# Patient Record
Sex: Female | Born: 1945
Health system: Southern US, Community
[De-identification: ages and names within clinical notes are randomized; demographics above are authoritative.]

## PROBLEM LIST (undated history)

## (undated) DIAGNOSIS — K52832 Lymphocytic colitis: Secondary | ICD-10-CM

## (undated) DIAGNOSIS — S62101A Fracture of unspecified carpal bone, right wrist, initial encounter for closed fracture: Secondary | ICD-10-CM

## (undated) DIAGNOSIS — K219 Gastro-esophageal reflux disease without esophagitis: Secondary | ICD-10-CM

## (undated) DIAGNOSIS — E871 Hypo-osmolality and hyponatremia: Secondary | ICD-10-CM

## (undated) DIAGNOSIS — R112 Nausea with vomiting, unspecified: Secondary | ICD-10-CM

## (undated) DIAGNOSIS — I722 Aneurysm of renal artery: Secondary | ICD-10-CM

## (undated) DIAGNOSIS — E041 Nontoxic single thyroid nodule: Secondary | ICD-10-CM

## (undated) DIAGNOSIS — M8580 Other specified disorders of bone density and structure, unspecified site: Secondary | ICD-10-CM

## (undated) DIAGNOSIS — M810 Age-related osteoporosis without current pathological fracture: Secondary | ICD-10-CM

## (undated) DIAGNOSIS — G43909 Migraine, unspecified, not intractable, without status migrainosus: Secondary | ICD-10-CM

## (undated) DIAGNOSIS — Z8669 Personal history of other diseases of the nervous system and sense organs: Secondary | ICD-10-CM

## (undated) DIAGNOSIS — S3690XA Unspecified injury of unspecified intra-abdominal organ, initial encounter: Secondary | ICD-10-CM

## (undated) DIAGNOSIS — M5430 Sciatica, unspecified side: Secondary | ICD-10-CM

## (undated) DIAGNOSIS — I10 Essential (primary) hypertension: Secondary | ICD-10-CM

## (undated) DIAGNOSIS — I447 Left bundle-branch block, unspecified: Secondary | ICD-10-CM

## (undated) DIAGNOSIS — Z9889 Other specified postprocedural states: Secondary | ICD-10-CM

## (undated) HISTORY — DX: Lymphocytic colitis: K52.832

## (undated) HISTORY — DX: Unspecified injury of unspecified intra-abdominal organ, initial encounter: S36.90XA

## (undated) HISTORY — DX: Sciatica, unspecified side: M54.30

## (undated) HISTORY — DX: Migraine, unspecified, not intractable, without status migrainosus: G43.909

## (undated) HISTORY — DX: Age-related osteoporosis without current pathological fracture: M81.0

## (undated) HISTORY — PX: KNEE ARTHROSCOPY W/ DEBRIDEMENT: SHX1867

## (undated) HISTORY — DX: Aneurysm of renal artery: I72.2

## (undated) HISTORY — DX: Essential (primary) hypertension: I10

## (undated) HISTORY — DX: Nontoxic single thyroid nodule: E04.1

## (undated) HISTORY — PX: ABDOMINOPLASTY: SUR9

## (undated) HISTORY — DX: Other specified disorders of bone density and structure, unspecified site: M85.80

## (undated) HISTORY — PX: BREAST SURGERY: SHX581

## (undated) HISTORY — PX: JOINT REPLACEMENT: SHX530

## (undated) HISTORY — DX: Hypo-osmolality and hyponatremia: E87.1

## (undated) HISTORY — DX: Fracture of unspecified carpal bone, right wrist, initial encounter for closed fracture: S62.101A

---
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
## 1972-09-12 HISTORY — PX: VAGINAL HYSTERECTOMY: SUR661

## 1991-09-13 HISTORY — PX: OTHER SURGICAL HISTORY: SHX169

## 1998-09-12 HISTORY — PX: HEMORRHOID SURGERY: SHX153

## 1998-10-15 ENCOUNTER — Other Ambulatory Visit: Admission: RE | Admit: 1998-10-15 | Discharge: 1998-10-15 | Payer: Self-pay | Admitting: *Deleted

## 1999-09-13 HISTORY — PX: BREAST EXCISIONAL BIOPSY: SUR124

## 1999-12-14 ENCOUNTER — Encounter: Admission: RE | Admit: 1999-12-14 | Discharge: 1999-12-14 | Payer: Self-pay

## 1999-12-14 ENCOUNTER — Other Ambulatory Visit: Admission: RE | Admit: 1999-12-14 | Discharge: 1999-12-14 | Payer: Self-pay | Admitting: *Deleted

## 1999-12-16 ENCOUNTER — Encounter: Admission: RE | Admit: 1999-12-16 | Discharge: 1999-12-16 | Payer: Self-pay

## 2000-01-25 ENCOUNTER — Encounter (INDEPENDENT_AMBULATORY_CARE_PROVIDER_SITE_OTHER): Payer: Self-pay | Admitting: *Deleted

## 2000-01-25 ENCOUNTER — Ambulatory Visit (HOSPITAL_COMMUNITY): Admission: RE | Admit: 2000-01-25 | Discharge: 2000-01-25 | Payer: Self-pay | Admitting: Surgery

## 2000-01-25 ENCOUNTER — Encounter: Payer: Self-pay | Admitting: Surgery

## 2000-01-27 ENCOUNTER — Encounter: Payer: Self-pay | Admitting: Surgery

## 2001-09-12 DIAGNOSIS — E041 Nontoxic single thyroid nodule: Secondary | ICD-10-CM

## 2001-09-12 HISTORY — DX: Nontoxic single thyroid nodule: E04.1

## 2001-11-23 ENCOUNTER — Encounter: Admission: RE | Admit: 2001-11-23 | Discharge: 2001-11-23 | Payer: Self-pay | Admitting: Surgery

## 2001-11-23 ENCOUNTER — Encounter: Payer: Self-pay | Admitting: Surgery

## 2002-01-22 ENCOUNTER — Encounter: Payer: Self-pay | Admitting: Surgery

## 2002-01-22 ENCOUNTER — Encounter: Admission: RE | Admit: 2002-01-22 | Discharge: 2002-01-22 | Payer: Self-pay | Admitting: Surgery

## 2002-01-22 ENCOUNTER — Ambulatory Visit (HOSPITAL_BASED_OUTPATIENT_CLINIC_OR_DEPARTMENT_OTHER): Admission: RE | Admit: 2002-01-22 | Discharge: 2002-01-22 | Payer: Self-pay | Admitting: Surgery

## 2002-01-22 ENCOUNTER — Encounter (INDEPENDENT_AMBULATORY_CARE_PROVIDER_SITE_OTHER): Payer: Self-pay | Admitting: *Deleted

## 2003-05-13 ENCOUNTER — Encounter: Admission: RE | Admit: 2003-05-13 | Discharge: 2003-05-13 | Payer: Self-pay | Admitting: Surgery

## 2003-05-13 ENCOUNTER — Encounter: Payer: Self-pay | Admitting: Surgery

## 2004-04-07 ENCOUNTER — Ambulatory Visit (HOSPITAL_COMMUNITY): Admission: RE | Admit: 2004-04-07 | Discharge: 2004-04-07 | Payer: Self-pay | Admitting: Obstetrics & Gynecology

## 2004-06-16 ENCOUNTER — Encounter: Admission: RE | Admit: 2004-06-16 | Discharge: 2004-06-16 | Payer: Self-pay | Admitting: Endocrinology

## 2004-06-18 ENCOUNTER — Encounter: Admission: RE | Admit: 2004-06-18 | Discharge: 2004-06-18 | Payer: Self-pay | Admitting: Obstetrics & Gynecology

## 2005-01-18 ENCOUNTER — Ambulatory Visit (HOSPITAL_COMMUNITY): Admission: RE | Admit: 2005-01-18 | Discharge: 2005-01-18 | Payer: Self-pay | Admitting: Endocrinology

## 2005-01-25 ENCOUNTER — Encounter: Admission: RE | Admit: 2005-01-25 | Discharge: 2005-01-25 | Payer: Self-pay | Admitting: Neurosurgery

## 2005-07-06 ENCOUNTER — Ambulatory Visit (HOSPITAL_COMMUNITY): Admission: RE | Admit: 2005-07-06 | Discharge: 2005-07-06 | Payer: Self-pay | Admitting: Endocrinology

## 2006-01-12 ENCOUNTER — Encounter: Admission: RE | Admit: 2006-01-12 | Discharge: 2006-01-12 | Payer: Self-pay | Admitting: Neurosurgery

## 2006-01-19 ENCOUNTER — Encounter: Admission: RE | Admit: 2006-01-19 | Discharge: 2006-01-19 | Payer: Self-pay | Admitting: Neurosurgery

## 2006-07-25 ENCOUNTER — Ambulatory Visit (HOSPITAL_COMMUNITY): Admission: RE | Admit: 2006-07-25 | Discharge: 2006-07-25 | Payer: Self-pay | Admitting: Endocrinology

## 2006-08-04 ENCOUNTER — Encounter: Admission: RE | Admit: 2006-08-04 | Discharge: 2006-08-04 | Payer: Self-pay | Admitting: Endocrinology

## 2006-08-09 ENCOUNTER — Encounter: Admission: RE | Admit: 2006-08-09 | Discharge: 2006-08-09 | Payer: Self-pay | Admitting: Endocrinology

## 2007-09-26 ENCOUNTER — Encounter: Admission: RE | Admit: 2007-09-26 | Discharge: 2007-09-26 | Payer: Self-pay | Admitting: Endocrinology

## 2008-05-12 ENCOUNTER — Ambulatory Visit (HOSPITAL_COMMUNITY): Admission: RE | Admit: 2008-05-12 | Discharge: 2008-05-12 | Payer: Self-pay | Admitting: Surgery

## 2008-05-12 ENCOUNTER — Encounter (INDEPENDENT_AMBULATORY_CARE_PROVIDER_SITE_OTHER): Payer: Self-pay | Admitting: Surgery

## 2008-08-21 ENCOUNTER — Encounter: Admission: RE | Admit: 2008-08-21 | Discharge: 2008-08-21 | Payer: Self-pay | Admitting: Endocrinology

## 2008-09-12 HISTORY — PX: ABDOMINOPLASTY: SUR9

## 2008-10-29 ENCOUNTER — Encounter: Admission: RE | Admit: 2008-10-29 | Discharge: 2008-10-29 | Payer: Self-pay | Admitting: Endocrinology

## 2009-09-12 DIAGNOSIS — S62101A Fracture of unspecified carpal bone, right wrist, initial encounter for closed fracture: Secondary | ICD-10-CM

## 2009-09-12 HISTORY — DX: Fracture of unspecified carpal bone, right wrist, initial encounter for closed fracture: S62.101A

## 2009-09-21 ENCOUNTER — Encounter: Admission: RE | Admit: 2009-09-21 | Discharge: 2009-09-21 | Payer: Self-pay | Admitting: Endocrinology

## 2009-09-28 ENCOUNTER — Encounter: Admission: RE | Admit: 2009-09-28 | Discharge: 2009-09-28 | Payer: Self-pay | Admitting: Psychiatry

## 2009-10-30 ENCOUNTER — Encounter: Admission: RE | Admit: 2009-10-30 | Discharge: 2009-10-30 | Payer: Self-pay | Admitting: Endocrinology

## 2009-11-19 ENCOUNTER — Ambulatory Visit (HOSPITAL_COMMUNITY): Admission: RE | Admit: 2009-11-19 | Discharge: 2009-11-19 | Payer: Self-pay | Admitting: Obstetrics & Gynecology

## 2010-03-10 ENCOUNTER — Emergency Department (HOSPITAL_COMMUNITY): Admission: EM | Admit: 2010-03-10 | Discharge: 2010-03-10 | Payer: Self-pay | Admitting: Emergency Medicine

## 2010-04-01 ENCOUNTER — Encounter: Admission: RE | Admit: 2010-04-01 | Discharge: 2010-04-01 | Payer: Self-pay | Admitting: Endocrinology

## 2010-08-31 ENCOUNTER — Ambulatory Visit: Payer: Self-pay | Admitting: Thoracic Surgery

## 2010-09-02 ENCOUNTER — Encounter
Admission: RE | Admit: 2010-09-02 | Discharge: 2010-09-02 | Payer: Self-pay | Source: Home / Self Care | Attending: Otolaryngology | Admitting: Otolaryngology

## 2010-09-15 ENCOUNTER — Ambulatory Visit
Admission: RE | Admit: 2010-09-15 | Discharge: 2010-09-15 | Payer: Self-pay | Source: Home / Self Care | Attending: Thoracic Surgery | Admitting: Thoracic Surgery

## 2010-09-15 ENCOUNTER — Ambulatory Visit: Admit: 2010-09-15 | Payer: Self-pay | Admitting: Thoracic Surgery

## 2010-09-15 ENCOUNTER — Encounter
Admission: RE | Admit: 2010-09-15 | Discharge: 2010-09-15 | Payer: Self-pay | Source: Home / Self Care | Attending: Thoracic Surgery | Admitting: Thoracic Surgery

## 2010-10-03 ENCOUNTER — Encounter: Payer: Self-pay | Admitting: Endocrinology

## 2010-10-18 ENCOUNTER — Other Ambulatory Visit: Payer: Self-pay | Admitting: Obstetrics and Gynecology

## 2010-10-18 DIAGNOSIS — Z1231 Encounter for screening mammogram for malignant neoplasm of breast: Secondary | ICD-10-CM

## 2010-11-01 ENCOUNTER — Ambulatory Visit (HOSPITAL_COMMUNITY)
Admission: RE | Admit: 2010-11-01 | Discharge: 2010-11-01 | Disposition: A | Discharge status: Home / Self Care | Payer: Medicare Other | Source: Ambulatory Visit | Attending: Obstetrics and Gynecology | Admitting: Obstetrics and Gynecology

## 2010-11-01 DIAGNOSIS — Z1231 Encounter for screening mammogram for malignant neoplasm of breast: Secondary | ICD-10-CM

## 2010-11-28 LAB — DIFFERENTIAL
Eosinophils Absolute: 0.2 10*3/uL (ref 0.0–0.7)
Lymphocytes Relative: 42 % (ref 12–46)
Lymphs Abs: 2.9 10*3/uL (ref 0.7–4.0)
Monocytes Relative: 9 % (ref 3–12)
Neutro Abs: 3.1 10*3/uL (ref 1.7–7.7)
Neutrophils Relative %: 45 % (ref 43–77)

## 2010-11-28 LAB — PROTIME-INR
INR: 1.02 (ref 0.00–1.49)
Prothrombin Time: 13.3 seconds (ref 11.6–15.2)

## 2010-11-28 LAB — CBC
Hemoglobin: 13.4 g/dL (ref 12.0–15.0)
MCH: 32.4 pg (ref 26.0–34.0)
RBC: 4.14 MIL/uL (ref 3.87–5.11)
WBC: 6.9 10*3/uL (ref 4.0–10.5)

## 2010-11-28 LAB — APTT: aPTT: 24 seconds (ref 24–37)

## 2010-11-28 LAB — BASIC METABOLIC PANEL
CO2: 25 mEq/L (ref 19–32)
Calcium: 8.9 mg/dL (ref 8.4–10.5)
GFR calc Af Amer: >60 mL/min (ref >60)
Sodium: 136 mEq/L (ref 135–145)

## 2011-01-25 NOTE — Assessment & Plan Note (Signed)
OFFICE VISIT   Kwiatek, Elaine Navarro  DOB:  03-May-1946                                        August 31, 2010  CHART #:  [id number]   HISTORY:  The patient comes in today on referral of Dr. Jonny Ruiz Rendall for  evaluation of a lump in her left lower rib cage.  The patient had a fall  back in the summer, at which time, she fractured her right wrist.  At  that time, a chest x-ray was performed which did not reveal any  significant abnormalities of the chest including any fractures.  She has  recovered well from her wrist fracture.  However, upon followup visit in  Dr. Sable Feil office on July 23, 2010, she had a new complaint of a  left rib lump, which had been there for approximately 1 to 1-1/2 month.  She has not noted this area previously and denies any tenderness,  drainage, or erythema to the area.  She also denies any injuries to the  chest wall.  She has had no recent infections and denies fevers, chills,  hemoptysis, or weight loss.   PHYSICAL EXAMINATION:  Blood pressure is 145/78, pulse is 71 and  regular, respirations 16 and unlabored, and O2 sat 100% on room air.  Her chest wall was examined closely and there are no obvious deformities  of the chest wall.  However, there is a small area of prominence around  the left 6th rib just below her breast which is slightly raised, but  nontender and firm.  There is no erythema or drainage.   Her chest x-ray from August 2011, was reviewed and again no bony  abnormalities were noted at that time.   IMPRESSION AND PLAN:  Dr. Edwyna Shell saw the patient and examined today as  well as reviewing her films.  He feels that this is most likely a little  costochondral swelling and does not feel that there is a true bony  abnormality.  However, we will obtain a CT of the chest to rule out  any possibility of bony tumor or silent fracture.  The patient was  reassured and we will plan to see her back following her CT  scan or  sooner if she develops any new symptoms.   Coral Ceo, P.A.   GC/MEDQ  D:  08/31/2010  T:  09/01/2010  Job:  [id number]   cc:   Jonny Ruiz L. Rendall, M.D.  Alfonse Alpers. Dagoberto Ligas, M.D.

## 2011-01-25 NOTE — Letter (Signed)
September 15, 2010   Alfonse Alpers. Dagoberto Ligas, MD  [address]  [place], [place] [address]   Re:  BERETTA, GINSBERG                DOB:  1946-04-10   Dear Virl Diamond,   I saw the patient back after we were evaluating her nodularity in the  left costal margin.  We went ahead and ordered a CT scan of this with  markers and showed no abnormalities.  I think this was probably related  to her fall which caused some either costochondritis or distant  inflammatory reaction in that area, but nothing that I am worried about  and I have informed her of this, but as you often see with CT scans, it  showed 2 other things.  It showed a dominant left thyroid nodule and  apparently you are following her about that and of course, they  suggested ultrasound.  She also has a 1.1 x 1.2 calcified lesion in the  right renal hilum which I think could possibly be a small renal artery  aneurysm.  Since it is so small, I am not sure anything would need to be  done, but just I informed her both of these findings and said that I  would let you know about it.  From my standpoint, I do not need to see  her again.  Her blood pressure was 125/76, pulse 80, respirations 18,  and sats were 100%.   Sincerely,   Ines Bloomer, M.D.  Electronically Signed   DPB/MEDQ  D:  09/15/2010  T:  09/15/2010  Job:  [id number]   cc:   Jonny Ruiz L. Rendall, M.D.

## 2011-01-25 NOTE — Op Note (Signed)
NAME:  Elaine Navarro, Elaine Navarro          ACCOUNT NO.:  [id number]   MEDICAL RECORD NO.:  [id number]          PATIENT TYPE:  AMB   LOCATION:  DAY                          FACILITY:  [facility]   PHYSICIAN:  Thornton Park. Daphine Deutscher, MD  DATE OF BIRTH:  1946-02-14   DATE OF PROCEDURE:  05/12/2008  DATE OF DISCHARGE:                               OPERATIVE REPORT   PREOPERATIVE DIAGNOSIS:  Prolapsing hemorrhoids.   POSTOPERATIVE DIAGNOSIS:  Prolapsing hemorrhoids.   PROCEDURE:  Procedure for prolapsing hemorrhoids (stapled  hemorrhoidectomy).   SURGEON:  Thornton Park. Daphine Deutscher, MD   ASSISTANT:  Alfonse Ras, MD   ANESTHESIA:  General endotracheal in the prone position.   DESCRIPTION OF PROCEDURE:  Elaine Navarro is a 65 year old lady  originally from [place] area who via [place] is back, living at  [facility].  She was seen in the office.  Informed consent obtained  regarding PPH procedure.  She was given general anesthesia and placed in  the prone position.  The perianal region was then prepped with [facility] including the vagina and the anus.  She had some soft brown stool  which was evacuated with ring forceps.  The area was prepped again.   At about 4 cm above the dentate line I created a uniform pursestring  with Prolene suture.  The EEA stapler was then advanced above that.  Prior to doing that I did inject the piles with Marcaine and Wydase and  then with a separate set up the sphincter muscles with some Marcaine  with epinephrine.  The stapler was then closed around and tightened down  around the anvil which was then closed.  This was in good alignment with  the anal canal and it closed on in to where the 4 cm mark was, right  where it should be right there at the anus.  Then kept compression for  over a minute and fired the stapler.  It was then withdrawn.  A good  staple line which was uniform and above about 2 cm above the dentate  line was present.  The hemorrhoidal ring  was inspected and there was a  good mucosa ring of hemorrhoidal tissue.  No muscle was seen.  Prior to  firing it also checked the vagina and there was no vaginal connection or  indentation.  We reinspected again and no bleeding was noted.  I went  ahead and inserted the Gelfoam tampon.  The patient was awakened and  taken to recovery room in satisfactory condition.   The patient will be given Vicodin for pain and Metamucil and Colace and  will be followed up in the office in about 2 to 3 weeks.      Thornton Park Daphine Deutscher, MD  Electronically Signed     MBM/MEDQ  D:  05/12/2008  T:  05/12/2008  Job:  [id number]   cc:   Alfonse Alpers. Dagoberto Ligas, M.D.  Fax: [phone]

## 2011-01-28 NOTE — Op Note (Signed)
[place]  Patient:    Elaine Navarro, Elaine Navarro Visit Number: [id number] MRN: [id number]          Service Type: DSU Location: [facility] Attending Physician:  Katha Cabal Dictated by:   Thornton Park Daphine Deutscher, M.D. Proc. Date: 01/22/02 Admit Date:  01/22/2002   CC:         Sharin Grave, M.D.  Cheral Marker, M.D.   Operative Report  CCS# [id number]  PREOPERATIVE DIAGNOSIS:  Suspicious areas in right breast on mammogram.  POSTOPERATIVE DIAGNOSIS:  Suspicious areas in right breast on mammogram.  OPERATION PERFORMED:  Needle localization right breast biopsy.  SURGEON:  Thornton Park. Daphine Deutscher, M.D.  ANESTHESIA:  Local MAC.  DESCRIPTION OF PROCEDURE:  The patient was taken to room 3 at [facility] after successful wire localization of these two lesions in the right breast.  There was an area on the skin marked which happened to correspond to a previous biopsy site.  I excised that old scar.  I put some marks on the skin to line up the subsequent closure of that incision.  The old scar was removed and I cut down and then did a generous wide excision of this entire area taking it down and re-excising it in toto.  The wire and the mass were sent for mammogram which confirmed the presence of the two suspicious areas.  This was sent for permanent section.  The breast was then irrigated and hemostasis achieved with electrocautery.  It was closed with 4-0 Vicryl subcutaneously and 5-0 Vicryl subcuticularly with benzoin and Steri-Strips. The patient seemed to tolerate the procedure well.  She will be given Vicodin to take for pain and will be followed up in the office in three weeks. Dictated by:   Thornton Park Daphine Deutscher, M.D. Attending Physician:  Katha Cabal DD:  01/22/02 TD:  01/23/02 Job: [id number]

## 2011-01-28 NOTE — Op Note (Signed)
[place]  Patient:    Elaine Navarro, Elaine Navarro                 MRN: [id number] Proc. Date: 01/25/00 Adm. Date:  [id number] Disc. Date: [id number] Attending:  Katha Cabal CC:         SHAMIL J. Patrecia Pace, M.D.                           Operative Report  PREOPERATIVE DIAGNOSIS:  New mass in right breast on ultrasound.  PROCEDURE:  Ultrasound, localized, right breast biopsy.  SURGEON:  Luretha Murphy, M.D.  ANESTHESIA:  MAC.  DESCRIPTION OF PROCEDURE:  Ms. Egelhoff was taken to the OR 16 at [facility] on Jan 25, 2000, and given intravenous sedation.  A wire was at about the 9 oclock position and appeared to be going angling downward.  I looked at her mammograms that had been done after the localization then patterned an incision somewhat lower than that and a little more medial.  After prepping this widely with Betadine I infiltrated with 1% lidocaine, made an incision, went down about 1/2 cm, and found the wire.  I sutured this into the surrounding tissue and then I cut around the wire and went down deep to it. As I got down beyond it I did enter a cyst that was filled with fluid, or a thick viscid material that was sort of dark looking.  I removed all of this and the cyst wall with a wire.  It was beyond my excision and went beyond the tip of the wire.  When I removed this I could feel it felt a little deeper and I felt a couple of little knots that were about 3 mm in size, about the size of BBs, and thinking these might be cysts or solid.  I went ahead and grasped those, and excised those with scissors.  In the end there was no palpable within the biopsy site that was anyway suspicious.  I irrigated and used the electrocautery to control some oozing areas.  I irrigated with some lidocaine also and I closed the wound with 4-0 and 5-0 Vicryl subcutaneously and subcuticularly with benzoin and steri-strips on the skin.  The patient  seemed to tolerate this procedure well.  In the meantime I got a specimen localization report saying that the suspicious area had been excised with the wire.  The patient will be given Tylox for pain and will be followed up in the office in 2-3 weeks. DD:  01/25/00 TD:  01/26/00 Job: [id number]

## 2011-03-23 ENCOUNTER — Other Ambulatory Visit: Payer: Self-pay | Admitting: Internal Medicine

## 2011-03-23 DIAGNOSIS — E042 Nontoxic multinodular goiter: Secondary | ICD-10-CM

## 2011-03-25 ENCOUNTER — Ambulatory Visit
Admission: RE | Admit: 2011-03-25 | Discharge: 2011-03-25 | Disposition: A | Discharge status: Home / Self Care | Payer: Medicare Other | Source: Ambulatory Visit | Attending: Internal Medicine | Admitting: Internal Medicine

## 2011-03-25 DIAGNOSIS — E042 Nontoxic multinodular goiter: Secondary | ICD-10-CM

## 2011-03-28 ENCOUNTER — Other Ambulatory Visit: Payer: Self-pay | Admitting: Internal Medicine

## 2011-03-28 DIAGNOSIS — E079 Disorder of thyroid, unspecified: Secondary | ICD-10-CM

## 2011-03-31 ENCOUNTER — Ambulatory Visit (HOSPITAL_COMMUNITY)
Admission: RE | Admit: 2011-03-31 | Discharge: 2011-03-31 | Disposition: A | Discharge status: Home / Self Care | Payer: Medicare Other | Source: Ambulatory Visit | Attending: Internal Medicine | Admitting: Internal Medicine

## 2011-03-31 ENCOUNTER — Other Ambulatory Visit: Payer: Self-pay | Admitting: Interventional Radiology

## 2011-03-31 DIAGNOSIS — E079 Disorder of thyroid, unspecified: Secondary | ICD-10-CM

## 2011-03-31 DIAGNOSIS — E049 Nontoxic goiter, unspecified: Secondary | ICD-10-CM | POA: Insufficient documentation

## 2011-08-08 ENCOUNTER — Other Ambulatory Visit: Payer: Self-pay | Admitting: Otolaryngology

## 2011-08-08 DIAGNOSIS — J329 Chronic sinusitis, unspecified: Secondary | ICD-10-CM

## 2011-08-11 ENCOUNTER — Ambulatory Visit
Admission: RE | Admit: 2011-08-11 | Discharge: 2011-08-11 | Disposition: A | Discharge status: Home / Self Care | Payer: Medicare Other | Source: Ambulatory Visit | Attending: Otolaryngology | Admitting: Otolaryngology

## 2011-08-11 DIAGNOSIS — J329 Chronic sinusitis, unspecified: Secondary | ICD-10-CM

## 2011-10-12 ENCOUNTER — Other Ambulatory Visit (HOSPITAL_COMMUNITY): Payer: Self-pay | Admitting: Family Medicine

## 2011-10-12 DIAGNOSIS — Z1231 Encounter for screening mammogram for malignant neoplasm of breast: Secondary | ICD-10-CM

## 2011-11-01 DIAGNOSIS — J209 Acute bronchitis, unspecified: Secondary | ICD-10-CM | POA: Diagnosis not present

## 2011-11-10 ENCOUNTER — Ambulatory Visit (HOSPITAL_COMMUNITY)
Admission: RE | Admit: 2011-11-10 | Discharge: 2011-11-10 | Disposition: A | Discharge status: Home / Self Care | Payer: Medicare Other | Source: Ambulatory Visit | Attending: Family Medicine | Admitting: Family Medicine

## 2011-11-10 DIAGNOSIS — Z1231 Encounter for screening mammogram for malignant neoplasm of breast: Secondary | ICD-10-CM | POA: Insufficient documentation

## 2011-11-25 DIAGNOSIS — G43719 Chronic migraine without aura, intractable, without status migrainosus: Secondary | ICD-10-CM | POA: Diagnosis not present

## 2011-11-25 DIAGNOSIS — G43909 Migraine, unspecified, not intractable, without status migrainosus: Secondary | ICD-10-CM | POA: Insufficient documentation

## 2011-11-25 DIAGNOSIS — G43709 Chronic migraine without aura, not intractable, without status migrainosus: Secondary | ICD-10-CM | POA: Diagnosis not present

## 2011-12-14 DIAGNOSIS — Z01419 Encounter for gynecological examination (general) (routine) without abnormal findings: Secondary | ICD-10-CM | POA: Diagnosis not present

## 2011-12-14 DIAGNOSIS — N899 Noninflammatory disorder of vagina, unspecified: Secondary | ICD-10-CM | POA: Diagnosis not present

## 2011-12-14 DIAGNOSIS — Z124 Encounter for screening for malignant neoplasm of cervix: Secondary | ICD-10-CM | POA: Diagnosis not present

## 2012-02-15 ENCOUNTER — Other Ambulatory Visit: Payer: Self-pay | Admitting: Family Medicine

## 2012-02-15 DIAGNOSIS — M810 Age-related osteoporosis without current pathological fracture: Secondary | ICD-10-CM

## 2012-02-15 DIAGNOSIS — Z79899 Other long term (current) drug therapy: Secondary | ICD-10-CM | POA: Diagnosis not present

## 2012-02-15 DIAGNOSIS — I10 Essential (primary) hypertension: Secondary | ICD-10-CM | POA: Diagnosis not present

## 2012-02-15 DIAGNOSIS — Z0000 Encounter for general adult medical examination without abnormal findings: Secondary | ICD-10-CM | POA: Diagnosis not present

## 2012-02-15 DIAGNOSIS — G479 Sleep disorder, unspecified: Secondary | ICD-10-CM | POA: Diagnosis not present

## 2012-03-02 DIAGNOSIS — G43719 Chronic migraine without aura, intractable, without status migrainosus: Secondary | ICD-10-CM | POA: Insufficient documentation

## 2012-03-13 ENCOUNTER — Ambulatory Visit
Admission: RE | Admit: 2012-03-13 | Discharge: 2012-03-13 | Disposition: A | Discharge status: Home / Self Care | Payer: Medicare Other | Source: Ambulatory Visit | Attending: Family Medicine | Admitting: Family Medicine

## 2012-03-13 DIAGNOSIS — M810 Age-related osteoporosis without current pathological fracture: Secondary | ICD-10-CM

## 2012-04-05 ENCOUNTER — Ambulatory Visit
Admission: RE | Admit: 2012-04-05 | Discharge: 2012-04-05 | Disposition: A | Discharge status: Home / Self Care | Payer: Medicare Other | Source: Ambulatory Visit | Attending: Family Medicine | Admitting: Family Medicine

## 2012-04-05 DIAGNOSIS — M810 Age-related osteoporosis without current pathological fracture: Secondary | ICD-10-CM | POA: Diagnosis not present

## 2012-04-05 DIAGNOSIS — Z780 Asymptomatic menopausal state: Secondary | ICD-10-CM | POA: Diagnosis not present

## 2012-04-09 DIAGNOSIS — E042 Nontoxic multinodular goiter: Secondary | ICD-10-CM | POA: Diagnosis not present

## 2012-04-11 ENCOUNTER — Other Ambulatory Visit: Payer: Self-pay | Admitting: Internal Medicine

## 2012-04-11 DIAGNOSIS — E049 Nontoxic goiter, unspecified: Secondary | ICD-10-CM

## 2012-04-11 DIAGNOSIS — E042 Nontoxic multinodular goiter: Secondary | ICD-10-CM | POA: Diagnosis not present

## 2012-04-13 ENCOUNTER — Ambulatory Visit
Admission: RE | Admit: 2012-04-13 | Discharge: 2012-04-13 | Disposition: A | Discharge status: Home / Self Care | Payer: Medicare Other | Source: Ambulatory Visit | Attending: Internal Medicine | Admitting: Internal Medicine

## 2012-04-13 DIAGNOSIS — E042 Nontoxic multinodular goiter: Secondary | ICD-10-CM | POA: Diagnosis not present

## 2012-04-13 DIAGNOSIS — E049 Nontoxic goiter, unspecified: Secondary | ICD-10-CM

## 2012-04-18 DIAGNOSIS — H6980 Other specified disorders of Eustachian tube, unspecified ear: Secondary | ICD-10-CM | POA: Diagnosis not present

## 2012-04-30 DIAGNOSIS — M79609 Pain in unspecified limb: Secondary | ICD-10-CM | POA: Diagnosis not present

## 2012-04-30 DIAGNOSIS — IMO0001 Reserved for inherently not codable concepts without codable children: Secondary | ICD-10-CM | POA: Diagnosis not present

## 2012-05-01 DIAGNOSIS — M79609 Pain in unspecified limb: Secondary | ICD-10-CM | POA: Diagnosis not present

## 2012-05-23 DIAGNOSIS — H6980 Other specified disorders of Eustachian tube, unspecified ear: Secondary | ICD-10-CM | POA: Diagnosis not present

## 2012-05-23 DIAGNOSIS — Z23 Encounter for immunization: Secondary | ICD-10-CM | POA: Diagnosis not present

## 2012-05-23 DIAGNOSIS — H9209 Otalgia, unspecified ear: Secondary | ICD-10-CM | POA: Diagnosis not present

## 2012-06-01 DIAGNOSIS — G4700 Insomnia, unspecified: Secondary | ICD-10-CM | POA: Diagnosis not present

## 2012-06-01 DIAGNOSIS — G43719 Chronic migraine without aura, intractable, without status migrainosus: Secondary | ICD-10-CM | POA: Diagnosis not present

## 2012-07-11 DIAGNOSIS — F411 Generalized anxiety disorder: Secondary | ICD-10-CM | POA: Diagnosis not present

## 2012-07-11 DIAGNOSIS — R197 Diarrhea, unspecified: Secondary | ICD-10-CM | POA: Diagnosis not present

## 2012-07-18 DIAGNOSIS — R197 Diarrhea, unspecified: Secondary | ICD-10-CM | POA: Diagnosis not present

## 2012-07-23 DIAGNOSIS — R197 Diarrhea, unspecified: Secondary | ICD-10-CM | POA: Diagnosis not present

## 2012-07-24 DIAGNOSIS — R197 Diarrhea, unspecified: Secondary | ICD-10-CM | POA: Diagnosis not present

## 2012-08-07 ENCOUNTER — Other Ambulatory Visit: Payer: Self-pay | Admitting: Gastroenterology

## 2012-08-07 DIAGNOSIS — R109 Unspecified abdominal pain: Secondary | ICD-10-CM

## 2012-08-07 DIAGNOSIS — R197 Diarrhea, unspecified: Secondary | ICD-10-CM | POA: Diagnosis not present

## 2012-08-13 ENCOUNTER — Ambulatory Visit
Admission: RE | Admit: 2012-08-13 | Discharge: 2012-08-13 | Disposition: A | Discharge status: Home / Self Care | Payer: Medicare Other | Source: Ambulatory Visit | Attending: Gastroenterology | Admitting: Gastroenterology

## 2012-08-13 DIAGNOSIS — R109 Unspecified abdominal pain: Secondary | ICD-10-CM

## 2012-08-13 DIAGNOSIS — I722 Aneurysm of renal artery: Secondary | ICD-10-CM | POA: Diagnosis not present

## 2012-08-13 MED ORDER — IOHEXOL 350 MG/ML SOLN: 100.0000 mL | Freq: Once | INTRAVENOUS | Status: DC | PRN

## 2012-08-13 MED ORDER — IOHEXOL 300 MG/ML  SOLN
100.0000 mL | Freq: Once | INTRAMUSCULAR | Status: AC | PRN
  Administered 2012-08-13: 100 mL via INTRAVENOUS

## 2012-08-16 DIAGNOSIS — M810 Age-related osteoporosis without current pathological fracture: Secondary | ICD-10-CM | POA: Diagnosis not present

## 2012-08-16 DIAGNOSIS — G479 Sleep disorder, unspecified: Secondary | ICD-10-CM | POA: Diagnosis not present

## 2012-08-16 DIAGNOSIS — I10 Essential (primary) hypertension: Secondary | ICD-10-CM | POA: Diagnosis not present

## 2012-09-17 DIAGNOSIS — G43719 Chronic migraine without aura, intractable, without status migrainosus: Secondary | ICD-10-CM | POA: Diagnosis not present

## 2012-09-20 DIAGNOSIS — F411 Generalized anxiety disorder: Secondary | ICD-10-CM | POA: Diagnosis not present

## 2012-09-20 DIAGNOSIS — G479 Sleep disorder, unspecified: Secondary | ICD-10-CM | POA: Diagnosis not present

## 2012-09-28 DIAGNOSIS — R197 Diarrhea, unspecified: Secondary | ICD-10-CM | POA: Diagnosis not present

## 2012-09-28 DIAGNOSIS — I722 Aneurysm of renal artery: Secondary | ICD-10-CM | POA: Diagnosis not present

## 2012-09-30 DIAGNOSIS — R197 Diarrhea, unspecified: Secondary | ICD-10-CM | POA: Insufficient documentation

## 2012-10-10 ENCOUNTER — Other Ambulatory Visit (HOSPITAL_COMMUNITY): Payer: Self-pay | Admitting: Family Medicine

## 2012-10-10 DIAGNOSIS — Z1231 Encounter for screening mammogram for malignant neoplasm of breast: Secondary | ICD-10-CM

## 2012-10-18 DIAGNOSIS — K8689 Other specified diseases of pancreas: Secondary | ICD-10-CM | POA: Diagnosis not present

## 2012-10-18 DIAGNOSIS — R197 Diarrhea, unspecified: Secondary | ICD-10-CM | POA: Diagnosis not present

## 2012-11-13 ENCOUNTER — Ambulatory Visit (HOSPITAL_COMMUNITY)
Admission: RE | Admit: 2012-11-13 | Discharge: 2012-11-13 | Disposition: A | Discharge status: Home / Self Care | Payer: Medicare Other | Source: Ambulatory Visit | Attending: Family Medicine | Admitting: Family Medicine

## 2012-11-13 DIAGNOSIS — Z1231 Encounter for screening mammogram for malignant neoplasm of breast: Secondary | ICD-10-CM | POA: Diagnosis not present

## 2012-11-22 ENCOUNTER — Encounter: Payer: Self-pay | Admitting: Internal Medicine

## 2012-11-22 ENCOUNTER — Encounter: Payer: Self-pay | Admitting: Obstetrics and Gynecology

## 2012-11-27 ENCOUNTER — Encounter: Payer: Self-pay | Admitting: Obstetrics and Gynecology

## 2012-12-05 DIAGNOSIS — L089 Local infection of the skin and subcutaneous tissue, unspecified: Secondary | ICD-10-CM | POA: Diagnosis not present

## 2012-12-12 DIAGNOSIS — R197 Diarrhea, unspecified: Secondary | ICD-10-CM | POA: Diagnosis not present

## 2012-12-14 ENCOUNTER — Ambulatory Visit (INDEPENDENT_AMBULATORY_CARE_PROVIDER_SITE_OTHER): Payer: Medicare Other | Admitting: Obstetrics and Gynecology

## 2012-12-14 ENCOUNTER — Encounter: Payer: Self-pay | Admitting: Obstetrics and Gynecology

## 2012-12-14 VITALS — BP 122/76 | Ht 62.5 in | Wt 116.0 lb

## 2012-12-14 DIAGNOSIS — Z01419 Encounter for gynecological examination (general) (routine) without abnormal findings: Secondary | ICD-10-CM

## 2012-12-14 DIAGNOSIS — Z124 Encounter for screening for malignant neoplasm of cervix: Secondary | ICD-10-CM

## 2012-12-14 MED ORDER — ESTRADIOL 10 MCG VA TABS: 1.0000 | ORAL_TABLET | VAGINAL | Status: DC

## 2012-12-14 NOTE — Patient Instructions (Signed)

## 2012-12-14 NOTE — Progress Notes (Addendum)
67 y.o.  Married  Caucasian female   [phone] here for annual exam.  Is being referred to [facility] for GI W/u and has appt May 16th due to recurrent diarrhea.  Given hyoscyamine bid for the last 2 days but no help so far.  Questions if she feels a lumb in her right breast over the last month.  No change over the month, and sometimes she can't feel it.  It is at the exact place where she has been biopsied before.  Doesn't like estrace cream b/c is comes out, so she hasn't used it for 2 months.  Wants vagifem back.  Has come off her [place].  The doctor at [facility] who Rx'd it for her refused to rx it anymore, and pt was sleep walking.  Doesn't sleep as well.    Patient's last menstrual period was 12/14/1972.          Sexually active: yes  The current method of family planning is post menopausal status.    Exercising: dancing, walking 6-7 days a week Last mammogram:  10/2012 normal Last pap smear:  History of abnormal pap: none Smoking: no Alcohol: 2 glasses of wine a day Last colonoscopy:2011 normal every 5 years Last Bone Density:  04/01/10 osteopenia  Sees Dr Clarene Duke at [facility]  Last tetanus shot:2013 Last cholesterol check: 2012 normal  Hgb:       pcp         Urine:pcp    Health Maintenance  Topic Date Due  . Zostavax  09/18/2004  . Pneumococcal Polysaccharide Vaccine Age 62 And Over  09/18/2009  . Influenza Vaccine  05/13/2013  . Mammogram  11/14/2014  . Colonoscopy  09/13/2019  . Tetanus/tdap  10/13/2020    Family History  Problem Relation Age of Onset  . Hypertension Mother   . Cancer Sister     melanoma  . Autoimmune disease Sister     crest and sjor  . Lupus Brother     There is no problem list on file for this patient.   Past Medical History  Diagnosis Date  . Hypertension   . Left thyroid nodule 2003    S/P biopsy - benign  . Fracture of right wrist 2011    Fell in bathroom.  . Osteopenia 2011    Past Surgical History  Procedure Laterality Date   . Vaginal hysterectomy  1974    Dysfunctional uterine bleeding.  Marland Kitchen Appendectomy  1962  . Breast surgery      S/P multiple breast biopsies.  . Hemorrhoid surgery  2000  . Dental implants  1993    Allergies: Caine-1; Hydrocodone; and Latex  Current Outpatient Prescriptions  Medication Sig Dispense Refill  . alendronate (FOSAMAX) 70 MG tablet Take 70 mg by mouth every 7 (seven) days. Take with a full glass of water on an empty stomach.      . calcium carbonate (OS-CAL) 600 MG TABS Take 600 mg by mouth 3 (three) times daily with meals.      . cholecalciferol (VITAMIN D) 1000 UNITS tablet Take 1,000 Units by mouth daily. Patient states dosage 1200 IU daily.      Marland Kitchen escitalopram (LEXAPRO) 10 MG tablet daily.       Marland Kitchen estradiol (ESTRACE) 0.1 MG/GM vaginal cream Place 0.5 g vaginally daily. 1/2 gram per vagina at bedtime 2 - 3 times a week.      . metoprolol tartrate (LOPRESSOR) 25 MG tablet Take 25 mg by mouth daily.       Marland Kitchen  Multiple Vitamin (MULTIVITAMIN) tablet Take 1 tablet by mouth daily.      . OnabotulinumtoxinA (BOTOX IM) Inject into the muscle every 4 (four) months. Injections to head/neck      . potassium chloride (K-DUR) 10 MEQ tablet Take 10 mEq by mouth 2 (two) times daily. Dosage not indicated by patient.  States she takes occasionally.      . valsartan (DIOVAN) 320 MG tablet Take 160 mg by mouth daily.        No current facility-administered medications for this visit.    ROS: Pertinent items are noted in HPI.  Exam:    BP 122/76  Ht 5' 2.5" (1.588 m)  Wt 116 lb (52.617 kg)  BMI 20.87 kg/m2  LMP 12/14/1972  Up 2.2 pounds since last year Wt Readings from Last 3 Encounters:  12/14/12 116 lb (52.617 kg)     Ht Readings from Last 3 Encounters:  12/14/12 5' 2.5" (1.588 m)    General appearance: alert, cooperative and appears stated age Head: Normocephalic, without obvious abnormality, atraumatic Neck: no adenopathy, supple, symmetrical, trachea midline and thyroid not  enlarged, symmetric, no tenderness/mass/nodules Lungs: clear to auscultation bilaterally Breasts: Inspection negative, No nipple retraction or dimpling, No nipple discharge or bleeding, No axillary or supraclavicular adenopathy, Normal to palpation without dominant masses.  Area of patient concern is on right at 9 o'clock where she and I both feel a linear thickening that is not a mass.  It is about 2 mm wide and 3 cm long and extends laterally from just outside the areola to the anterior axially line, and is completely c/w nl breast texture.      Heart: regular rate and rhythm Abdomen: soft, non-tender; bowel sounds normal; no masses,  no organomegaly Extremities: extremities normal, atraumatic, no cyanosis or edema Skin: Skin color, texture, turgor normal. No rashes or lesions Lymph nodes: Cervical, supraclavicular, and axillary nodes normal. No abnormal inguinal nodes palpated Neurologic: Grossly normal   Pelvic: External genitalia:  no lesions              Urethra:  normal appearing urethra with no masses, tenderness or lesions              Bartholins and Skenes: normal                 Vagina: normal appearing vagina with normal color and discharge, no lesions; decreased rugae              Cervix: surgically absent              Pap taken: no        Bimanual Exam:  Adnexa: normal adnexa in size, nontender and no masses                                      Rectovaginal: Confirms                                      Anus:  normal sphincter tone, no lesions  A: normal menopausal exam, No HRT.  Quit it in 2009 after 15 years of use.     Multiple breast biopsies     Vulvar atrophy     S/p TVH, ovaries retained, d/t DUB     Recurrent diarrhea, under W/U     P:  mammogram counseled on breast self exam, mammography screening, adequate intake of calcium and vitamin D, diet and exercise return annually or prn   Rec: BMD US done at PCP - pt will discuss with him at upcoming visit in  June Rx vagifem biw 64mos/ 1 yr.     An After Visit Summary was printed and given to the patient.

## 2012-12-24 DIAGNOSIS — G43719 Chronic migraine without aura, intractable, without status migrainosus: Secondary | ICD-10-CM | POA: Diagnosis not present

## 2013-02-25 DIAGNOSIS — R109 Unspecified abdominal pain: Secondary | ICD-10-CM | POA: Diagnosis not present

## 2013-02-25 DIAGNOSIS — R197 Diarrhea, unspecified: Secondary | ICD-10-CM | POA: Diagnosis not present

## 2013-03-12 DIAGNOSIS — K52832 Lymphocytic colitis: Secondary | ICD-10-CM

## 2013-03-12 HISTORY — DX: Lymphocytic colitis: K52.832

## 2013-03-19 DIAGNOSIS — K644 Residual hemorrhoidal skin tags: Secondary | ICD-10-CM | POA: Diagnosis not present

## 2013-03-19 DIAGNOSIS — R197 Diarrhea, unspecified: Secondary | ICD-10-CM | POA: Diagnosis not present

## 2013-03-19 DIAGNOSIS — K5289 Other specified noninfective gastroenteritis and colitis: Secondary | ICD-10-CM | POA: Diagnosis not present

## 2013-03-21 DIAGNOSIS — F411 Generalized anxiety disorder: Secondary | ICD-10-CM | POA: Diagnosis not present

## 2013-04-01 DIAGNOSIS — G43719 Chronic migraine without aura, intractable, without status migrainosus: Secondary | ICD-10-CM | POA: Diagnosis not present

## 2013-04-15 DIAGNOSIS — E042 Nontoxic multinodular goiter: Secondary | ICD-10-CM | POA: Diagnosis not present

## 2013-04-17 DIAGNOSIS — E042 Nontoxic multinodular goiter: Secondary | ICD-10-CM | POA: Diagnosis not present

## 2013-04-17 DIAGNOSIS — R197 Diarrhea, unspecified: Secondary | ICD-10-CM | POA: Diagnosis not present

## 2013-05-08 ENCOUNTER — Telehealth: Payer: Self-pay | Admitting: Obstetrics and Gynecology

## 2013-05-08 ENCOUNTER — Other Ambulatory Visit: Payer: Self-pay | Admitting: Obstetrics and Gynecology

## 2013-05-08 MED ORDER — TRETINOIN 0.05 % EX CREA: TOPICAL_CREAM | Freq: Every day | CUTANEOUS | Status: DC

## 2013-05-08 NOTE — Telephone Encounter (Signed)
S/w patient she is aware that her rx has been sent to her pharmacy and that she may have to pay for it since medicare probably won't cover it.

## 2013-05-08 NOTE — Telephone Encounter (Signed)
I sent the Rx to her drug store with 2 refills.  I doubt medicare will pay for it, so she may have to pay for it herself.

## 2013-05-08 NOTE — Telephone Encounter (Signed)
Please advise last AEX was 12/14/12

## 2013-05-08 NOTE — Telephone Encounter (Signed)
Patient would like to have Tretinoin cream .05%.  She has already dicussed this with Dr. Tresa Res and Dr. Tresa Res told her that she would write a Rx. For it.

## 2013-05-29 DIAGNOSIS — K5289 Other specified noninfective gastroenteritis and colitis: Secondary | ICD-10-CM | POA: Diagnosis not present

## 2013-05-29 DIAGNOSIS — K52832 Lymphocytic colitis: Secondary | ICD-10-CM | POA: Insufficient documentation

## 2013-05-29 DIAGNOSIS — R109 Unspecified abdominal pain: Secondary | ICD-10-CM | POA: Diagnosis not present

## 2013-05-29 DIAGNOSIS — R197 Diarrhea, unspecified: Secondary | ICD-10-CM | POA: Diagnosis not present

## 2013-06-05 DIAGNOSIS — Z23 Encounter for immunization: Secondary | ICD-10-CM | POA: Diagnosis not present

## 2013-07-05 ENCOUNTER — Ambulatory Visit (INDEPENDENT_AMBULATORY_CARE_PROVIDER_SITE_OTHER): Payer: Medicare Other | Admitting: Internal Medicine

## 2013-07-05 DIAGNOSIS — Z23 Encounter for immunization: Secondary | ICD-10-CM | POA: Diagnosis not present

## 2013-07-05 DIAGNOSIS — Z789 Other specified health status: Secondary | ICD-10-CM | POA: Diagnosis not present

## 2013-07-05 MED ORDER — CIPROFLOXACIN HCL 500 MG PO TABS: 500.0000 mg | ORAL_TABLET | Freq: Two times a day (BID) | ORAL | Status: DC

## 2013-07-08 DIAGNOSIS — G43719 Chronic migraine without aura, intractable, without status migrainosus: Secondary | ICD-10-CM | POA: Diagnosis not present

## 2013-08-10 DIAGNOSIS — S6390XA Sprain of unspecified part of unspecified wrist and hand, initial encounter: Secondary | ICD-10-CM | POA: Diagnosis not present

## 2013-08-19 NOTE — Progress Notes (Signed)
   Subjective:    Patient ID: Elaine Navarro, female    DOB: 10-23-1945, 67 y.o.   MRN: [id number]  HPI 67 yo F traveling for 2wk to [place] and [place] with her husband this fall. She has extensive travel overseas.  Allergies  Allergen Reactions  . Caine-1 [Lidocaine]     Allergic to Septocaine Articaine HCL 4% w/epi  . Hydrocodone Nausea Only  . Latex     Possible allergy.   Current Outpatient Prescriptions on File Prior to Visit  Medication Sig Dispense Refill  . alendronate (FOSAMAX) 70 MG tablet Take 70 mg by mouth every 7 (seven) days. Take with a full glass of water on an empty stomach.      . calcium carbonate (OS-CAL) 600 MG TABS Take 600 mg by mouth 3 (three) times daily with meals.      . cholecalciferol (VITAMIN D) 1000 UNITS tablet Take 1,000 Units by mouth daily. Patient states dosage 1200 IU daily.      Marland Kitchen escitalopram (LEXAPRO) 10 MG tablet daily.       . Estradiol (VAGIFEM) 10 MCG TABS Place 1 tablet (10 mcg total) vaginally 2 (two) times a week. Use every night before bed for two weeks when you first begin this medicine, then after the first two weeks, begin using it twice a week.  24 tablet  3  . metoprolol tartrate (LOPRESSOR) 25 MG tablet Take 25 mg by mouth daily.       . Multiple Vitamin (MULTIVITAMIN) tablet Take 1 tablet by mouth daily.      . OnabotulinumtoxinA (BOTOX IM) Inject into the muscle every 4 (four) months. Injections to head/neck      . potassium chloride (K-DUR) 10 MEQ tablet Take 10 mEq by mouth 2 (two) times daily. Dosage not indicated by patient.  States she takes occasionally.      . tretinoin (RETIN-A) 0.05 % cream Apply topically at bedtime.  45 g  2  . valsartan (DIOVAN) 320 MG tablet Take 160 mg by mouth daily.        No current facility-administered medications on file prior to visit.       Review of Systems     Objective:   Physical Exam        Assessment & Plan:  Pre-travel vaccination = gave rx for yellow fever and  typhoid  Traveler's diarrhea= gave tips sheet and antibiotic rx if needed, instrucitons how to use.

## 2013-10-11 DIAGNOSIS — I722 Aneurysm of renal artery: Secondary | ICD-10-CM | POA: Diagnosis not present

## 2013-10-11 DIAGNOSIS — I701 Atherosclerosis of renal artery: Secondary | ICD-10-CM | POA: Diagnosis not present

## 2013-10-18 DIAGNOSIS — Z0000 Encounter for general adult medical examination without abnormal findings: Secondary | ICD-10-CM | POA: Diagnosis not present

## 2013-10-18 DIAGNOSIS — M79609 Pain in unspecified limb: Secondary | ICD-10-CM | POA: Diagnosis not present

## 2013-10-18 DIAGNOSIS — E042 Nontoxic multinodular goiter: Secondary | ICD-10-CM | POA: Diagnosis not present

## 2013-10-18 DIAGNOSIS — M949 Disorder of cartilage, unspecified: Secondary | ICD-10-CM | POA: Diagnosis not present

## 2013-10-18 DIAGNOSIS — Z1331 Encounter for screening for depression: Secondary | ICD-10-CM | POA: Diagnosis not present

## 2013-10-18 DIAGNOSIS — R82998 Other abnormal findings in urine: Secondary | ICD-10-CM | POA: Diagnosis not present

## 2013-10-18 DIAGNOSIS — G43909 Migraine, unspecified, not intractable, without status migrainosus: Secondary | ICD-10-CM | POA: Diagnosis not present

## 2013-10-18 DIAGNOSIS — M899 Disorder of bone, unspecified: Secondary | ICD-10-CM | POA: Diagnosis not present

## 2013-10-18 DIAGNOSIS — I10 Essential (primary) hypertension: Secondary | ICD-10-CM | POA: Diagnosis not present

## 2013-10-18 DIAGNOSIS — Z23 Encounter for immunization: Secondary | ICD-10-CM | POA: Diagnosis not present

## 2013-10-18 DIAGNOSIS — Z86010 Personal history of colonic polyps: Secondary | ICD-10-CM | POA: Diagnosis not present

## 2013-10-21 DIAGNOSIS — G43719 Chronic migraine without aura, intractable, without status migrainosus: Secondary | ICD-10-CM | POA: Diagnosis not present

## 2013-10-22 ENCOUNTER — Other Ambulatory Visit (HOSPITAL_COMMUNITY): Payer: Self-pay | Admitting: Family Medicine

## 2013-10-22 DIAGNOSIS — Z1231 Encounter for screening mammogram for malignant neoplasm of breast: Secondary | ICD-10-CM

## 2013-11-14 ENCOUNTER — Ambulatory Visit (HOSPITAL_COMMUNITY)
Admission: RE | Admit: 2013-11-14 | Discharge: 2013-11-14 | Disposition: A | Discharge status: Home / Self Care | Payer: Medicare Other | Source: Ambulatory Visit | Attending: Family Medicine | Admitting: Family Medicine

## 2013-11-14 DIAGNOSIS — Z1231 Encounter for screening mammogram for malignant neoplasm of breast: Secondary | ICD-10-CM | POA: Diagnosis not present

## 2013-11-21 DIAGNOSIS — S63639A Sprain of interphalangeal joint of unspecified finger, initial encounter: Secondary | ICD-10-CM | POA: Diagnosis not present

## 2013-11-24 ENCOUNTER — Emergency Department (HOSPITAL_COMMUNITY): Payer: Medicare Other

## 2013-11-24 ENCOUNTER — Emergency Department (HOSPITAL_COMMUNITY)
Admission: EM | Admit: 2013-11-24 | Discharge: 2013-11-25 | Disposition: A | Discharge status: Home / Self Care | Payer: Medicare Other | Attending: Emergency Medicine | Admitting: Emergency Medicine

## 2013-11-24 ENCOUNTER — Encounter (HOSPITAL_COMMUNITY): Payer: Self-pay | Admitting: Emergency Medicine

## 2013-11-24 DIAGNOSIS — S0003XA Contusion of scalp, initial encounter: Secondary | ICD-10-CM | POA: Insufficient documentation

## 2013-11-24 DIAGNOSIS — S060X1A Concussion with loss of consciousness of 30 minutes or less, initial encounter: Secondary | ICD-10-CM | POA: Insufficient documentation

## 2013-11-24 DIAGNOSIS — Z8739 Personal history of other diseases of the musculoskeletal system and connective tissue: Secondary | ICD-10-CM | POA: Insufficient documentation

## 2013-11-24 DIAGNOSIS — Z8669 Personal history of other diseases of the nervous system and sense organs: Secondary | ICD-10-CM | POA: Insufficient documentation

## 2013-11-24 DIAGNOSIS — F10929 Alcohol use, unspecified with intoxication, unspecified: Secondary | ICD-10-CM

## 2013-11-24 DIAGNOSIS — S00209A Unspecified superficial injury of unspecified eyelid and periocular area, initial encounter: Secondary | ICD-10-CM | POA: Insufficient documentation

## 2013-11-24 DIAGNOSIS — S1093XA Contusion of unspecified part of neck, initial encounter: Secondary | ICD-10-CM

## 2013-11-24 DIAGNOSIS — S0083XA Contusion of other part of head, initial encounter: Secondary | ICD-10-CM

## 2013-11-24 DIAGNOSIS — S199XXA Unspecified injury of neck, initial encounter: Secondary | ICD-10-CM | POA: Diagnosis not present

## 2013-11-24 DIAGNOSIS — F1010 Alcohol abuse, uncomplicated: Secondary | ICD-10-CM | POA: Insufficient documentation

## 2013-11-24 DIAGNOSIS — Z862 Personal history of diseases of the blood and blood-forming organs and certain disorders involving the immune mechanism: Secondary | ICD-10-CM | POA: Insufficient documentation

## 2013-11-24 DIAGNOSIS — Z8781 Personal history of (healed) traumatic fracture: Secondary | ICD-10-CM | POA: Insufficient documentation

## 2013-11-24 DIAGNOSIS — Y939 Activity, unspecified: Secondary | ICD-10-CM | POA: Insufficient documentation

## 2013-11-24 DIAGNOSIS — W108XXA Fall (on) (from) other stairs and steps, initial encounter: Secondary | ICD-10-CM | POA: Insufficient documentation

## 2013-11-24 DIAGNOSIS — S0990XA Unspecified injury of head, initial encounter: Secondary | ICD-10-CM

## 2013-11-24 DIAGNOSIS — Z8639 Personal history of other endocrine, nutritional and metabolic disease: Secondary | ICD-10-CM | POA: Insufficient documentation

## 2013-11-24 DIAGNOSIS — Z79899 Other long term (current) drug therapy: Secondary | ICD-10-CM | POA: Insufficient documentation

## 2013-11-24 DIAGNOSIS — T50901A Poisoning by unspecified drugs, medicaments and biological substances, accidental (unintentional), initial encounter: Secondary | ICD-10-CM | POA: Diagnosis not present

## 2013-11-24 DIAGNOSIS — Y92009 Unspecified place in unspecified non-institutional (private) residence as the place of occurrence of the external cause: Secondary | ICD-10-CM | POA: Insufficient documentation

## 2013-11-24 DIAGNOSIS — Z87891 Personal history of nicotine dependence: Secondary | ICD-10-CM | POA: Insufficient documentation

## 2013-11-24 DIAGNOSIS — S0993XA Unspecified injury of face, initial encounter: Secondary | ICD-10-CM | POA: Diagnosis not present

## 2013-11-24 DIAGNOSIS — Z91040 Latex allergy status: Secondary | ICD-10-CM | POA: Insufficient documentation

## 2013-11-24 DIAGNOSIS — I10 Essential (primary) hypertension: Secondary | ICD-10-CM | POA: Insufficient documentation

## 2013-11-24 DIAGNOSIS — IMO0002 Reserved for concepts with insufficient information to code with codable children: Secondary | ICD-10-CM | POA: Insufficient documentation

## 2013-11-24 DIAGNOSIS — Z792 Long term (current) use of antibiotics: Secondary | ICD-10-CM | POA: Insufficient documentation

## 2013-11-24 HISTORY — DX: Personal history of other diseases of the nervous system and sense organs: Z86.69

## 2013-11-24 LAB — BASIC METABOLIC PANEL
BUN: 19 mg/dL (ref 6–23)
CO2: 25 mEq/L (ref 19–32)
Calcium: 9.5 mg/dL (ref 8.4–10.5)
Chloride: 102 mEq/L (ref 96–112)
Creatinine, Ser: 0.76 mg/dL (ref 0.50–1.10)
GFR calc Af Amer: >90 mL/min (ref >90)
GFR calc non Af Amer: 84 mL/min — ABNORMAL LOW (ref >90)
Glucose, Bld: 75 mg/dL (ref 70–99)
Potassium: 3.7 mEq/L (ref 3.7–5.3)
Sodium: 143 mEq/L (ref 137–147)

## 2013-11-24 LAB — CBC WITH DIFFERENTIAL/PLATELET
Basophils Absolute: 0.1 10*3/uL (ref 0.0–0.1)
Basophils Relative: 1 % (ref 0–1)
Eosinophils Absolute: 0.1 10*3/uL (ref 0.0–0.7)
Eosinophils Relative: 2 % (ref 0–5)
HCT: 43.6 % (ref 36.0–46.0)
Hemoglobin: 15.2 g/dL — ABNORMAL HIGH (ref 12.0–15.0)
Lymphocytes Relative: 40 % (ref 12–46)
Lymphs Abs: 2.7 10*3/uL (ref 0.7–4.0)
MCH: 32.1 pg (ref 26.0–34.0)
MCHC: 34.9 g/dL (ref 30.0–36.0)
MCV: 92.2 fL (ref 78.0–100.0)
Monocytes Absolute: 0.5 10*3/uL (ref 0.1–1.0)
Monocytes Relative: 7 % (ref 3–12)
Neutro Abs: 3.4 10*3/uL (ref 1.7–7.7)
Neutrophils Relative %: 51 % (ref 43–77)
Platelets: 211 10*3/uL (ref 150–400)
RBC: 4.73 MIL/uL (ref 3.87–5.11)
RDW: 12.9 % (ref 11.5–15.5)
WBC: 6.7 10*3/uL (ref 4.0–10.5)

## 2013-11-24 LAB — ETHANOL: Alcohol, Ethyl (B): 218 mg/dL — ABNORMAL HIGH (ref 0–11)

## 2013-11-24 MED ORDER — ONDANSETRON HCL 4 MG/2ML IJ SOLN
4.0000 mg | Freq: Once | INTRAMUSCULAR | Status: AC
  Administered 2013-11-24: 4 mg via INTRAVENOUS
  Filled 2013-11-24: qty 2

## 2013-11-24 MED ORDER — SODIUM CHLORIDE 0.9 % IV SOLN
INTRAVENOUS | Status: DC
  Administered 2013-11-24: 23:00:00 via INTRAVENOUS

## 2013-11-24 MED ORDER — LORAZEPAM 2 MG/ML IJ SOLN
1.0000 mg | Freq: Once | INTRAMUSCULAR | Status: AC
  Administered 2013-11-24: 1 mg via INTRAVENOUS
  Filled 2013-11-24: qty 1

## 2013-11-24 NOTE — ED Notes (Signed)
Per EMS pt fell at home from approx 4 steps landing on hard wood floor; pt LOC about 6 minutes; unresponsive on EMS arrival but shortly after coming to; Pt exhibiting some erratic behaviors; repeating questions and no memory of what happen; Pt has had 2-3 glasses of wine tonight;

## 2013-11-24 NOTE — Discharge Instructions (Signed)
Concussion, Adult A concussion, or closed-head injury, is a brain injury caused by a direct blow to the head or by a quick and sudden movement (jolt) of the head or neck. Concussions are usually not life-threatening. Even so, the effects of a concussion can be serious. If you have had a concussion before, you are more likely to experience concussion-like symptoms after a direct blow to the head.  CAUSES  Facial or Scalp Contusion A facial or scalp contusion is a deep bruise on the face or head. Injuries to the face and head generally cause a lot of swelling, especially around the eyes. Contusions are the result of an injury that caused bleeding under the skin. The contusion may turn blue, purple, or yellow. Minor injuries will give you a painless contusion, but more severe contusions may stay painful and swollen for a few weeks.  CAUSES  A facial or scalp contusion is caused by a blunt injury or trauma to the face or head area.  SIGNS AND SYMPTOMS   Swelling of the injured area.   Discoloration of the injured area.   Tenderness, soreness, or pain in the injured area.  DIAGNOSIS  The diagnosis can be made by taking a medical history and doing a physical exam. An X-ray exam, CT scan, or MRI may be needed to determine if there are any associated injuries, such as broken bones (fractures). TREATMENT  Often, the best treatment for a facial or scalp contusion is applying cold compresses to the injured area. Over-the-counter medicines may also be recommended for pain control.  HOME CARE INSTRUCTIONS   Only take over-the-counter or prescription medicines as directed by your health care provider.   Apply ice to the injured area.   Put ice in a plastic bag.   Place a towel between your skin and the bag.   Leave the ice on for 20 minutes, 2 3 times a day.  SEEK MEDICAL CARE IF:  You have bite problems.   You have pain with chewing.   You are concerned about facial defects. SEEK  IMMEDIATE MEDICAL CARE IF:  You have severe pain or a headache that is not relieved by medicine.   You have unusual sleepiness, confusion, or personality changes.   You throw up (vomit).   You have a persistent nosebleed.   You have double vision or blurred vision.   You have fluid drainage from your nose or ear.   You have difficulty walking or using your arms or legs.  MAKE SURE YOU:   Understand these instructions.  Will watch your condition.  Will get help right away if you are not doing well or get worse. Document Released: 10/06/2004 Document Revised: 06/19/2013 Document Reviewed: 04/11/2013 [facility] Patient Information 2014 [place], [place].   Direct blow to the head, such as from running into another player during a soccer game, being hit in a fight, or hitting your head on a hard surface.  A jolt of the head or neck that causes the brain to move back and forth inside the skull, such as in a car crash. SIGNS AND SYMPTOMS  The signs of a concussion can be hard to notice. Early on, they may be missed by you, family members, and health care providers. You may look fine but act or feel differently. Symptoms are usually temporary, but they may last for days, weeks, or even longer. Some symptoms may appear right away while others may not show up for hours or days. Every head injury is  different. Symptoms include:   Mild to moderate headaches that will not go away.  A feeling of pressure inside your head.  Having more trouble than usual:   Learning or remembering things you have heard.  Answering questions.  Paying attention or concentrating.   Organizing daily tasks.   Making decisions and solving problems.   Slowness in thinking, acting or reacting, speaking, or reading.   Getting lost or being easily confused.   Feeling tired all the time or lacking energy (fatigued).   Feeling drowsy.   Sleep disturbances.   Sleeping more than usual.    Sleeping less than usual.   Trouble falling asleep.   Trouble sleeping (insomnia).   Loss of balance or feeling lightheaded or dizzy.   Nausea or vomiting.   Numbness or tingling.   Increased sensitivity to:   Sounds.   Lights.   Distractions.   Vision problems or eyes that tire easily.   Diminished sense of taste or smell.   Ringing in the ears.   Mood changes such as feeling sad or anxious.   Becoming easily irritated or angry for little or no reason.   Lack of motivation.  Seeing or hearing things other people do not see or hear (hallucinations). DIAGNOSIS  Your health care provider can usually diagnose a concussion based on a description of your injury and symptoms. He or she will ask whether you passed out (lost consciousness) and whether you are having trouble remembering events that happened right before and during your injury.  Your evaluation might include:   A brain scan to look for signs of injury to the brain. Even if the test shows no injury, you may still have a concussion.   Blood tests to be sure other problems are not present. TREATMENT   Concussions are usually treated in an emergency department, in urgent care, or at a clinic. You may need to stay in the hospital overnight for further treatment.   Tell your health care provider if you are taking any medicines, including prescription medicines, over-the-counter medicines, and natural remedies. Some medicines, such as blood thinners (anticoagulants) and aspirin, may increase the chance of complications. Also tell your health care provider whether you have had alcohol or are taking illegal drugs. This information may affect treatment.  Your health care provider will send you home with important instructions to follow.  How fast you will recover from a concussion depends on many factors. These factors include how severe your concussion is, what part of your brain was injured, your  age, and how healthy you were before the concussion.  Most people with mild injuries recover fully. Recovery can take time. In general, recovery is slower in older persons. Also, persons who have had a concussion in the past or have other medical problems may find that it takes longer to recover from their current injury. HOME CARE INSTRUCTIONS  General Instructions  Carefully follow the directions your health care provider gave you.  Only take over-the-counter or prescription medicines for pain, discomfort, or fever as directed by your health care provider.  Take only those medicines that your health care provider has approved.  Do not drink alcohol until your health care provider says you are well enough to do so. Alcohol and certain other drugs may slow your recovery and can put you at risk of further injury.  If it is harder than usual to remember things, write them down.  If you are easily distracted, try to do one  thing at a time. For example, do not try to watch TV while fixing dinner.  Talk with family members or close friends when making important decisions.  Keep all follow-up appointments. Repeated evaluation of your symptoms is recommended for your recovery.  Watch your symptoms and tell others to do the same. Complications sometimes occur after a concussion. Older adults with a brain injury may have a higher risk of serious complications such as of a blood clot on the brain.  Tell your teachers, school nurse, school counselor, coach, athletic trainer, or work Freight forwarder about your injury, symptoms, and restrictions. Tell them about what you can or cannot do. They should watch for:   Increased problems with attention or concentration.   Increased difficulty remembering or learning new information.   Increased time needed to complete tasks or assignments.   Increased irritability or decreased ability to cope with stress.   Increased symptoms.   Rest. Rest helps the  brain to heal. Make sure you:  Get plenty of sleep at night. Avoid staying up late at night.  Keep the same bedtime hours on weekends and weekdays.  Rest during the day. Take daytime naps or rest breaks when you feel tired.  Limit activities that require a lot of thought or concentration. These includes   Doing homework or job-related work.   Watching TV.   Working on the computer.  Avoid any situation where there is potential for another head injury (football, hockey, soccer, basketball, martial arts, downhill snow sports and horseback riding). Your condition will get worse every time you experience a concussion. You should avoid these activities until you are evaluated by the appropriate follow-up caregivers. Returning To Your Regular Activities You will need to return to your normal activities slowly, not all at once. You must give your body and brain enough time for recovery.  Do not return to sports or other athletic activities until your health care provider tells you it is safe to do so.  Ask your health care provider when you can drive, ride a bicycle, or operate heavy machinery. Your ability to react may be slower after a brain injury. Never do these activities if you are dizzy.  Ask your health care provider about when you can return to work or school. Preventing Another Concussion It is very important to avoid another brain injury, especially before you have recovered. In rare cases, another injury can lead to permanent brain damage, brain swelling, or death. The risk of this is greatest during the first 7 10 days after a head injury. Avoid injuries by:   Wearing a seat belt when riding in a car.   Drinking alcohol only in moderation.   Wearing a helmet when biking, skiing, skateboarding, skating, or doing similar activities.  Avoiding activities that could lead to a second concussion, such as contact or recreational sports, until your health care provider says it is  OK.  Taking safety measures in your home.   Remove clutter and tripping hazards from floors and stairways.   Use grab bars in bathrooms and handrails by stairs.   Place non-slip mats on floors and in bathtubs.   Improve lighting in dim areas. SEEK MEDICAL CARE IF:   You have increased problems paying attention or concentrating.   You have increased difficulty remembering or learning new information.   You need more time to complete tasks or assignments than before.   You have increased irritability or decreased ability to cope with stress.  You have  more symptoms than before. Seek medical care if you have any of the following symptoms for more than 2 weeks after your injury:   Lasting (chronic) headaches.   Dizziness or balance problems.   Nausea.  Vision problems.   Increased sensitivity to noise or light.   Depression or mood swings.   Anxiety or irritability.   Memory problems.   Difficulty concentrating or paying attention.   Sleep problems.   Feeling tired all the time. SEEK IMMEDIATE MEDICAL CARE IF:   You have severe or worsening headaches. These may be a sign of a blood clot in the brain.  You have weakness (even if only in one hand, leg, or part of the face).  You have numbness.  You have decreased coordination.   You vomit repeatedly.  You have increased sleepiness.  One pupil is larger than the other.   You have convulsions.   You have slurred speech.   You have increased confusion. This may be a sign of a blood clot in the brain.  You have increased restlessness, agitation, or irritability.   You are unable to recognize people or places.   You have neck pain.   It is difficult to wake you up.   You have unusual behavior changes.   You lose consciousness. MAKE SURE YOU:   Understand these instructions.  Will watch your condition.  Will get help right away if you are not doing well or get  worse. Document Released: 11/19/2003 Document Revised: 05/01/2013 Document Reviewed: 03/21/2013 [facility] Patient Information 2014 [place], [place].

## 2013-11-24 NOTE — ED Notes (Signed)
Patient transported to CT 

## 2013-11-24 NOTE — Progress Notes (Signed)
Orthopedic Tech Progress Note Patient Details:  Elaine Navarro June 17, 1946 [id number]  Patient ID: Elaine Navarro, female   DOB: 25-Apr-1946, 68 y.o.   MRN: [id number] Made level 2 trauma visit  Hildred Priest 11/24/2013, 10:15 PM

## 2013-11-24 NOTE — ED Provider Notes (Signed)
CSN: [id number]     Arrival date & time 11/24/13  2204 History   First MD Initiated Contact with Patient 11/24/13 2211     Chief Complaint  Patient presents with  . Fall     (Consider location/radiation/quality/duration/timing/severity/associated sxs/prior Treatment) HPI  68yF brought in by EMS. Fall at home. Pt amnestic to events. Apparently fell forward down a few steps. Found laying face down and unresponsive for several minutes. Pt alert on arrival but with repetitive questioning and some inappropriate behavior. Generally pleasant but inappropriate comments and mildly combative at times. Admits to drinking wine. Denies other ingestion. Denies any pain. No blood thinners. Nausea. No vomiting. No acute visual complaints. No numbness, tingling or loss of strength. IV established prehospital. No other interventions prior to arrival.  Past Medical History  Diagnosis Date  . Hypertension   . Left thyroid nodule 2003    S/P biopsy - benign  . Fracture of right wrist 2011    Fell in bathroom.  . Osteopenia 2011  . Hx of migraines    Past Surgical History  Procedure Laterality Date  . Vaginal hysterectomy  1974    Dysfunctional uterine bleeding.  Marland Kitchen Appendectomy  1962  . Breast surgery      S/P multiple breast biopsies.  . Hemorrhoid surgery  2000  . Dental implants  1993   Family History  Problem Relation Age of Onset  . Hypertension Mother   . Cancer Sister     melanoma  . Autoimmune disease Sister     crest and sjor  . Lupus Brother    History  Substance Use Topics  . Smoking status: Former Smoker    Quit date: 12/15/1982  . Smokeless tobacco: Not on file  . Alcohol Use: 7.0 oz/week    14 drink(s) per week     Comment: 2 glasses of wine a day   OB History   Grav Para Term Preterm Abortions TAB SAB Ect Mult Living   3 2 1  0 1 0 1 0 0 2     Review of Systems  All systems reviewed and negative, other than as noted in HPI.   Allergies  Caine-1; Hydrocodone; and  Latex  Home Medications   Current Outpatient Rx  Name  Route  Sig  Dispense  Refill  . alendronate (FOSAMAX) 70 MG tablet   Oral   Take 70 mg by mouth every 7 (seven) days. Take with a full glass of water on an empty stomach.         . calcium carbonate (OS-CAL) 600 MG TABS   Oral   Take 600 mg by mouth 3 (three) times daily with meals.         . cholecalciferol (VITAMIN D) 1000 UNITS tablet   Oral   Take 1,000 Units by mouth daily. Patient states dosage 1200 IU daily.         . ciprofloxacin (CIPRO) 500 MG tablet   Oral   Take 1 tablet (500 mg total) by mouth 2 (two) times daily. If needed for 3 loose stools in 24 hr. Can stop taking if diarrhea resolves   10 tablet   0   . escitalopram (LEXAPRO) 10 MG tablet      daily.          . Estradiol (VAGIFEM) 10 MCG TABS   Vaginal   Place 1 tablet (10 mcg total) vaginally 2 (two) times a week. Use every night before bed for two weeks when you  first begin this medicine, then after the first two weeks, begin using it twice a week.   24 tablet   3     Refill with 24 tabs.   . metoprolol tartrate (LOPRESSOR) 25 MG tablet   Oral   Take 25 mg by mouth daily.          . Multiple Vitamin (MULTIVITAMIN) tablet   Oral   Take 1 tablet by mouth daily.         . OnabotulinumtoxinA (BOTOX IM)   Intramuscular   Inject into the muscle every 4 (four) months. Injections to head/neck         . potassium chloride (K-DUR) 10 MEQ tablet   Oral   Take 10 mEq by mouth 2 (two) times daily. Dosage not indicated by patient.  States she takes occasionally.         . tretinoin (RETIN-A) 0.05 % cream   Topical   Apply topically at bedtime.   45 g   2   . valsartan (DIOVAN) 320 MG tablet   Oral   Take 160 mg by mouth daily.           BP 112/67  Pulse 77  Temp(Src) 98.3 F (36.8 C)  Resp 16  SpO2 100%  LMP 12/14/1972 Physical Exam  Nursing note and vitals reviewed. Constitutional: She is oriented to person, place,  and time. She appears well-developed and well-nourished. No distress.  HENT:  Head: Normocephalic.  Small area of abraions to L lower lip with mild swelling. No lac. Dentition intact. Oropharynx clear. No apparent bony tenderness. Small abrasion L upper eye lid. No periorbital skin changes. PERRLA. EOMI. Anterior chambers clear.   Eyes: Conjunctivae are normal. Right eye exhibits no discharge. Left eye exhibits no discharge.  Neck: Neck supple.  Cardiovascular: Normal rate, regular rhythm and normal heart sounds.  Exam reveals no gallop and no friction rub.   No murmur heard. Pulmonary/Chest: Effort normal and breath sounds normal. No respiratory distress.  Abdominal: Soft. She exhibits no distension. There is no tenderness.  Musculoskeletal: She exhibits no edema and no tenderness.  Neurological: She is alert and oriented to person, place, and time. No cranial nerve deficit. She exhibits normal muscle tone. Coordination normal.  Skin: Skin is warm and dry.  Psychiatric:  Speech clear. Answering questions and following commands, but some answers inappropriate.    ED Course  Procedures (including critical care time) Labs Review Labs Reviewed  ETHANOL - Abnormal; Notable for the following:    Alcohol, Ethyl (B) 218 (*)    All other components within normal limits  CBC WITH DIFFERENTIAL - Abnormal; Notable for the following:    Hemoglobin 15.2 (*)    All other components within normal limits  BASIC METABOLIC PANEL - Abnormal; Notable for the following:    GFR calc non Af Amer 84 (*)    All other components within normal limits   Imaging Review Ct Head Wo Contrast  11/24/2013   CLINICAL DATA:  Fall.  EXAM: CT HEAD WITHOUT CONTRAST  CT CERVICAL SPINE WITHOUT CONTRAST  TECHNIQUE: Multidetector CT imaging of the head and cervical spine was performed following the standard protocol without intravenous contrast. Multiplanar CT image reconstructions of the cervical spine were also generated.   COMPARISON:  CT C SPINE W/O CM dated 11/24/2013; CT MAXILLOFACIAL LIMITED W/O dated 08/11/2011; CT SINUS LTD W/O CM dated 09/02/2010; MR C SPINE WO/W CM dated 09/28/2009; MR-BRAIN dated 01/10/2008; CT CHEST W/O CM dated 09/15/2010.  FINDINGS: CT HEAD FINDINGS  No mass. No hydrocephalus. No hemorrhage. No acute bony abnormality identified. No evidence of fracture. Small mucous retention cyst right maxillary sinus. Mastoids clear.  CT CERVICAL SPINE FINDINGS  Diffuse degenerative change cervical spine. No evidence of fracture or dislocation. Biapical pleural parenchymal thickening is noted consistent with scarring again noted. Multinodular thyroid gland noted with the dominant nodule containing calcifications on the left . If this is not been evaluated by ultrasound and possible biopsy of this should be considered.  IMPRESSION: 1. No acute intracranial abnormality. 2. Diffuse degenerative changes cervical spine. 3. Multinodular thyroid gland with a dominant nodule containing calcifications on the left. If this has not been evaluated by ultrasound and possible biopsy, this should be considered.   Electronically Signed   By: [place]   On: 11/24/2013 23:33   Ct Cervical Spine Wo Contrast  11/24/2013   CLINICAL DATA:  Fall.  EXAM: CT HEAD WITHOUT CONTRAST  CT CERVICAL SPINE WITHOUT CONTRAST  TECHNIQUE: Multidetector CT imaging of the head and cervical spine was performed following the standard protocol without intravenous contrast. Multiplanar CT image reconstructions of the cervical spine were also generated.  COMPARISON:  CT C SPINE W/O CM dated 11/24/2013; CT MAXILLOFACIAL LIMITED W/O dated 08/11/2011; CT SINUS LTD W/O CM dated 09/02/2010; MR C SPINE WO/W CM dated 09/28/2009; MR-BRAIN dated 01/10/2008; CT CHEST W/O CM dated 09/15/2010.  FINDINGS: CT HEAD FINDINGS  No mass. No hydrocephalus. No hemorrhage. No acute bony abnormality identified. No evidence of fracture. Small mucous retention cyst right maxillary sinus.  Mastoids clear.  CT CERVICAL SPINE FINDINGS  Diffuse degenerative change cervical spine. No evidence of fracture or dislocation. Biapical pleural parenchymal thickening is noted consistent with scarring again noted. Multinodular thyroid gland noted with the dominant nodule containing calcifications on the left . If this is not been evaluated by ultrasound and possible biopsy of this should be considered.  IMPRESSION: 1. No acute intracranial abnormality. 2. Diffuse degenerative changes cervical spine. 3. Multinodular thyroid gland with a dominant nodule containing calcifications on the left. If this has not been evaluated by ultrasound and possible biopsy, this should be considered.   Electronically Signed   By: Marcello Moores  Register   On: 11/24/2013 23:33     EKG Interpretation   Date/Time:  Sunday November 24 2013 22:22:26 EDT Ventricular Rate:  78 PR Interval:  135 QRS Duration: 84 QT Interval:  408 QTC Calculation: 465 R Axis:   86 Text Interpretation:  Sinus rhythm Non-specific ST-t changes some diffuse  t wave flattening as compared to previous from 2011 Confirmed by Kadynce Bonds   MD, Ethete ([id number]) on 11/24/2013 11:39:42 PM      MDM   Final diagnoses:  Closed head injury  Facial contusion  Alcohol intoxication    68 year old female with suspected mechanical fall. Alcohol intoxication is somewhat cloudy and clinical picture. No clear history of syncope. EKG is fairly not concerning. Basic labs unremarkable. CT of the head and cervical spine did not show any acute abnormality. Family and friends at this time states that patient's behavior is not out of line with her usual demeanor. Low suspicion for emergent process. Return precautions were discussed as well as the need for outpatient followup for further evaluation of thyroid mass.     Virgel Manifold, MD 11/26/13 2200

## 2013-11-24 NOTE — Progress Notes (Signed)
Chaplain responded to level two fall. No family present.

## 2013-12-02 ENCOUNTER — Encounter: Payer: Self-pay | Admitting: Gynecology

## 2013-12-17 DIAGNOSIS — L821 Other seborrheic keratosis: Secondary | ICD-10-CM | POA: Diagnosis not present

## 2013-12-17 DIAGNOSIS — L738 Other specified follicular disorders: Secondary | ICD-10-CM | POA: Diagnosis not present

## 2013-12-17 DIAGNOSIS — T07XXXA Unspecified multiple injuries, initial encounter: Secondary | ICD-10-CM | POA: Diagnosis not present

## 2013-12-17 DIAGNOSIS — L708 Other acne: Secondary | ICD-10-CM | POA: Diagnosis not present

## 2013-12-19 DIAGNOSIS — H47329 Drusen of optic disc, unspecified eye: Secondary | ICD-10-CM | POA: Diagnosis not present

## 2013-12-19 DIAGNOSIS — H43819 Vitreous degeneration, unspecified eye: Secondary | ICD-10-CM | POA: Diagnosis not present

## 2013-12-27 ENCOUNTER — Ambulatory Visit: Payer: Medicare Other | Admitting: Obstetrics and Gynecology

## 2013-12-27 ENCOUNTER — Ambulatory Visit: Payer: Medicare Other | Admitting: Gynecology

## 2013-12-31 ENCOUNTER — Ambulatory Visit: Payer: Medicare Other | Admitting: Gynecology

## 2013-12-31 DIAGNOSIS — L570 Actinic keratosis: Secondary | ICD-10-CM | POA: Diagnosis not present

## 2013-12-31 DIAGNOSIS — L738 Other specified follicular disorders: Secondary | ICD-10-CM | POA: Diagnosis not present

## 2014-01-27 DIAGNOSIS — G43719 Chronic migraine without aura, intractable, without status migrainosus: Secondary | ICD-10-CM | POA: Diagnosis not present

## 2014-02-17 DIAGNOSIS — H43819 Vitreous degeneration, unspecified eye: Secondary | ICD-10-CM | POA: Diagnosis not present

## 2014-02-17 DIAGNOSIS — H04139 Lacrimal cyst, unspecified lacrimal gland: Secondary | ICD-10-CM | POA: Diagnosis not present

## 2014-02-25 ENCOUNTER — Encounter: Payer: Self-pay | Admitting: Obstetrics & Gynecology

## 2014-02-25 ENCOUNTER — Ambulatory Visit (INDEPENDENT_AMBULATORY_CARE_PROVIDER_SITE_OTHER): Payer: Medicare Other | Admitting: Obstetrics & Gynecology

## 2014-02-25 VITALS — BP 124/70 | HR 68 | Temp 98.3°F | Wt 121.0 lb

## 2014-02-25 DIAGNOSIS — Z124 Encounter for screening for malignant neoplasm of cervix: Secondary | ICD-10-CM

## 2014-02-25 DIAGNOSIS — Z01419 Encounter for gynecological examination (general) (routine) without abnormal findings: Secondary | ICD-10-CM | POA: Diagnosis not present

## 2014-02-25 MED ORDER — ESTRADIOL 10 MCG VA TABS: 1.0000 | ORAL_TABLET | VAGINAL | Status: DC

## 2014-02-25 MED ORDER — BUPROPION HCL ER (XL) 150 MG PO TB24: 150.0000 mg | ORAL_TABLET | Freq: Every day | ORAL | Status: DC

## 2014-02-25 NOTE — Progress Notes (Addendum)
68 y.o. Elaine Navarro MarriedCaucasianF here for annual exam.  No vaginal bleeding.  Did 3D MMG due to dense breast.     Having some thyroid issues that is being followed by Dr. Buddy Duty.  Does labs with Dr. Rex Kras.  Brought those today.    Pt admits very good friend is concerned about her alcohol consumption.  Pt answered two CAGE questions with positive responses.  She is really not interested in treatment options.  Wants to try on her own first.    Patient's last menstrual period was 12/14/1972.          Sexually active: yes  The current method of family planning is status post hysterectomy.    Exercising: yes  walking and dance Smoker:  Former smoker  Health Maintenance: Pap:  12/24/09 History of abnormal Pap:  no MMG:  11/14/13 3D-normal Colonoscopy:  7/14-repeat in 3 years, [facility] BMD:   7/13, -1.6/-2.2--stable from 2011 TDaP:  2015 Screening Labs: PCP, Hb today: PCP, Urine today: PCP   reports that she quit smoking about 31 years ago. She has never used smokeless tobacco. She reports that she drinks alcohol. She reports that she does not use illicit drugs.  Past Medical History  Diagnosis Date  . Hypertension   . Left thyroid nodule 2003    S/P biopsy - benign  . Fracture of right wrist 2011    Fell in bathroom.  . Osteopenia 2011  . Hx of migraines     l  . Lymphocytic colitis 7/14  . Visceral injury     detached due to recent fall    Past Surgical History  Procedure Laterality Date  . Vaginal hysterectomy  1974    Dysfunctional uterine bleeding.  Marland Kitchen Appendectomy  1962  . Breast surgery      S/P multiple breast biopsies.  . Hemorrhoid surgery  2000  . Dental implants  1993    Current Outpatient Prescriptions  Medication Sig Dispense Refill  . alendronate (FOSAMAX) 70 MG tablet Take 70 mg by mouth every 7 (seven) days. Take with a full glass of water on an empty stomach. Take on Sunday.      Marland Kitchen amLODipine (NORVASC) 5 MG tablet Take 5 mg by mouth daily.      .  BUDESONIDE PO Take 3 mg by mouth as needed.      . calcium carbonate (OS-CAL) 600 MG TABS Take 600 mg by mouth 3 (three) times daily with meals.      . cholecalciferol (VITAMIN D) 1000 UNITS tablet Take 1,000 Units by mouth daily. Patient states dosage 1200 IU daily.      Marland Kitchen escitalopram (LEXAPRO) 20 MG tablet Take 20 mg by mouth daily.       . Estradiol 10 MCG TABS vaginal tablet Place 1 tablet vaginally 2 (two) times a week. Take  twice a week.      . losartan (COZAAR) 50 MG tablet Take 50 mg by mouth daily.      . magnesium oxide (MAG-OX) 400 MG tablet Take by mouth.      . Multiple Vitamin (MULTIVITAMIN) tablet Take 1 tablet by mouth daily.      . Omega-3 1000 MG CAPS Take by mouth.      . potassium chloride (K-DUR) 10 MEQ tablet Take 10 mEq by mouth 2 (two) times daily. Dosage not indicated by patient.  States she takes occasionally.      Marland Kitchen Resveratrol (PA RESVERATROL) 250 MG CAPS Take by mouth.  No current facility-administered medications for this visit.    Family History  Problem Relation Age of Onset  . Hypertension Mother   . Cancer Sister     melanoma  . Autoimmune disease Sister     crest and sjor  . Lupus Brother     ROS:  Pertinent items are noted in HPI.  Otherwise, a comprehensive ROS was negative.  Exam:   BP 124/70  Pulse 68  Temp(Src) 98.3 F (36.8 C) (Oral)  Wt 121 lb (54.885 kg)  LMP 12/14/1972  Weight change: +5#      Ht Readings from Last 3 Encounters:  12/14/12 5' 2.5" (1.588 m)    General appearance: alert, cooperative and appears stated age Head: Normocephalic, without obvious abnormality, atraumatic Neck: no adenopathy, supple, symmetrical, but enlarged.  No dominant nodules nodules Lungs: clear to auscultation bilaterally Breasts: normal appearance, no masses or tenderness Heart: regular rate and rhythm Abdomen: soft, non-tender; bowel sounds normal; no masses,  no organomegaly Extremities: extremities normal, atraumatic, no cyanosis or  edema Skin: Skin color, texture, turgor normal. No rashes or lesions Lymph nodes: Cervical, supraclavicular, and axillary nodes normal. No abnormal inguinal nodes palpated Neurologic: Grossly normal   Pelvic: External genitalia:  no lesions              Urethra:  normal appearing urethra with no masses, tenderness or lesions              Bartholins and Skenes: normal                 Vagina: normal appearing vagina with normal color and discharge, no lesions              Cervix: absent              Pap taken: no Bimanual Exam:  Uterus:  uterus absent              Adnexa: normal adnexa and no mass, fullness, tenderness               Rectovaginal: Confirms               Anus:  normal sphincter tone, no lesions  A:  Well Woman with normal exam H/O lymphocytic colitis.  Follow due 2016.   TVH age 70 due to DUB. Enlarged, symmetric thyroid.  Sees endocrinology yearly. Hypertension Depression Concerns about ETOH consumption.  Pt will try to limit to two "normal" glasses of wine daily.  She will let me know if desires additional recommendations.  P:   Mammogram yearly pap smear not indicated Change to Wellbutrin 150XL daily due to concerns about weight gain.  Will give update after taking for 30 days. vagifem 74meq pv twice weekly.  Rx to pharmacy. return annually or prn  An After Visit Summary was printed and given to the patient.

## 2014-03-03 MED ORDER — ESTRADIOL 10 MCG VA TABS: 1.0000 | ORAL_TABLET | VAGINAL | Status: DC

## 2014-03-03 NOTE — Addendum Note (Signed)
Addended by: Megan Salon on: 03/03/2014 08:52 AM   Modules accepted: Orders

## 2014-03-20 DIAGNOSIS — M5137 Other intervertebral disc degeneration, lumbosacral region: Secondary | ICD-10-CM | POA: Diagnosis not present

## 2014-03-20 DIAGNOSIS — M76899 Other specified enthesopathies of unspecified lower limb, excluding foot: Secondary | ICD-10-CM | POA: Diagnosis not present

## 2014-03-20 DIAGNOSIS — M999 Biomechanical lesion, unspecified: Secondary | ICD-10-CM | POA: Diagnosis not present

## 2014-03-20 DIAGNOSIS — IMO0002 Reserved for concepts with insufficient information to code with codable children: Secondary | ICD-10-CM | POA: Diagnosis not present

## 2014-03-21 DIAGNOSIS — M5430 Sciatica, unspecified side: Secondary | ICD-10-CM | POA: Diagnosis not present

## 2014-04-15 ENCOUNTER — Other Ambulatory Visit: Payer: Self-pay | Admitting: Internal Medicine

## 2014-04-15 DIAGNOSIS — E042 Nontoxic multinodular goiter: Secondary | ICD-10-CM | POA: Diagnosis not present

## 2014-04-15 DIAGNOSIS — E049 Nontoxic goiter, unspecified: Secondary | ICD-10-CM

## 2014-04-17 DIAGNOSIS — E042 Nontoxic multinodular goiter: Secondary | ICD-10-CM | POA: Diagnosis not present

## 2014-04-18 ENCOUNTER — Ambulatory Visit
Admission: RE | Admit: 2014-04-18 | Discharge: 2014-04-18 | Disposition: A | Discharge status: Home / Self Care | Payer: Medicare Other | Source: Ambulatory Visit | Attending: Internal Medicine | Admitting: Internal Medicine

## 2014-04-18 DIAGNOSIS — E049 Nontoxic goiter, unspecified: Secondary | ICD-10-CM | POA: Diagnosis not present

## 2014-05-05 DIAGNOSIS — G43719 Chronic migraine without aura, intractable, without status migrainosus: Secondary | ICD-10-CM | POA: Diagnosis not present

## 2014-06-09 DIAGNOSIS — Z23 Encounter for immunization: Secondary | ICD-10-CM | POA: Diagnosis not present

## 2014-07-14 ENCOUNTER — Encounter: Payer: Self-pay | Admitting: Obstetrics & Gynecology

## 2014-08-01 DIAGNOSIS — I10 Essential (primary) hypertension: Secondary | ICD-10-CM | POA: Diagnosis not present

## 2014-08-01 DIAGNOSIS — M8580 Other specified disorders of bone density and structure, unspecified site: Secondary | ICD-10-CM | POA: Diagnosis not present

## 2014-08-04 DIAGNOSIS — G43719 Chronic migraine without aura, intractable, without status migrainosus: Secondary | ICD-10-CM | POA: Diagnosis not present

## 2014-10-10 DIAGNOSIS — I10 Essential (primary) hypertension: Secondary | ICD-10-CM | POA: Diagnosis not present

## 2014-10-10 DIAGNOSIS — B351 Tinea unguium: Secondary | ICD-10-CM | POA: Diagnosis not present

## 2014-10-10 DIAGNOSIS — Z79899 Other long term (current) drug therapy: Secondary | ICD-10-CM | POA: Diagnosis not present

## 2014-10-14 DIAGNOSIS — H65191 Other acute nonsuppurative otitis media, right ear: Secondary | ICD-10-CM | POA: Diagnosis not present

## 2014-10-27 DIAGNOSIS — M8580 Other specified disorders of bone density and structure, unspecified site: Secondary | ICD-10-CM | POA: Diagnosis not present

## 2014-10-27 DIAGNOSIS — I10 Essential (primary) hypertension: Secondary | ICD-10-CM | POA: Diagnosis not present

## 2014-10-29 DIAGNOSIS — E042 Nontoxic multinodular goiter: Secondary | ICD-10-CM | POA: Diagnosis not present

## 2014-10-29 DIAGNOSIS — I10 Essential (primary) hypertension: Secondary | ICD-10-CM | POA: Diagnosis not present

## 2014-10-29 DIAGNOSIS — M8580 Other specified disorders of bone density and structure, unspecified site: Secondary | ICD-10-CM | POA: Diagnosis not present

## 2014-10-29 DIAGNOSIS — R8290 Unspecified abnormal findings in urine: Secondary | ICD-10-CM | POA: Diagnosis not present

## 2014-10-29 DIAGNOSIS — I722 Aneurysm of renal artery: Secondary | ICD-10-CM | POA: Diagnosis not present

## 2014-10-29 DIAGNOSIS — Z86010 Personal history of colonic polyps: Secondary | ICD-10-CM | POA: Diagnosis not present

## 2014-10-29 DIAGNOSIS — Z0000 Encounter for general adult medical examination without abnormal findings: Secondary | ICD-10-CM | POA: Diagnosis not present

## 2014-10-29 DIAGNOSIS — G43909 Migraine, unspecified, not intractable, without status migrainosus: Secondary | ICD-10-CM | POA: Diagnosis not present

## 2014-11-10 ENCOUNTER — Other Ambulatory Visit: Payer: Self-pay | Admitting: Obstetrics & Gynecology

## 2014-11-10 DIAGNOSIS — Z1231 Encounter for screening mammogram for malignant neoplasm of breast: Secondary | ICD-10-CM

## 2014-11-12 DIAGNOSIS — H65191 Other acute nonsuppurative otitis media, right ear: Secondary | ICD-10-CM | POA: Diagnosis not present

## 2014-11-24 DIAGNOSIS — G43719 Chronic migraine without aura, intractable, without status migrainosus: Secondary | ICD-10-CM | POA: Diagnosis not present

## 2014-12-03 ENCOUNTER — Ambulatory Visit (HOSPITAL_COMMUNITY): Payer: Medicare Other

## 2014-12-04 ENCOUNTER — Ambulatory Visit (HOSPITAL_COMMUNITY)
Admission: RE | Admit: 2014-12-04 | Discharge: 2014-12-04 | Disposition: A | Discharge status: Home / Self Care | Payer: Medicare Other | Source: Ambulatory Visit | Attending: Obstetrics & Gynecology | Admitting: Obstetrics & Gynecology

## 2014-12-04 DIAGNOSIS — M8580 Other specified disorders of bone density and structure, unspecified site: Secondary | ICD-10-CM | POA: Diagnosis not present

## 2014-12-04 DIAGNOSIS — Z1231 Encounter for screening mammogram for malignant neoplasm of breast: Secondary | ICD-10-CM

## 2014-12-04 DIAGNOSIS — M859 Disorder of bone density and structure, unspecified: Secondary | ICD-10-CM | POA: Diagnosis not present

## 2014-12-12 ENCOUNTER — Telehealth: Payer: Self-pay | Admitting: Obstetrics & Gynecology

## 2014-12-12 ENCOUNTER — Ambulatory Visit (INDEPENDENT_AMBULATORY_CARE_PROVIDER_SITE_OTHER): Payer: Medicare Other | Admitting: Obstetrics & Gynecology

## 2014-12-12 ENCOUNTER — Encounter: Payer: Self-pay | Admitting: Obstetrics & Gynecology

## 2014-12-12 VITALS — BP 118/72 | HR 60 | Resp 16 | Wt 121.8 lb

## 2014-12-12 DIAGNOSIS — N63 Unspecified lump in breast: Secondary | ICD-10-CM | POA: Diagnosis not present

## 2014-12-12 DIAGNOSIS — Z1231 Encounter for screening mammogram for malignant neoplasm of breast: Secondary | ICD-10-CM

## 2014-12-12 DIAGNOSIS — N644 Mastodynia: Secondary | ICD-10-CM

## 2014-12-12 DIAGNOSIS — N6310 Unspecified lump in the right breast, unspecified quadrant: Secondary | ICD-10-CM

## 2014-12-12 NOTE — Progress Notes (Signed)
Subjective:     Patient ID: Elaine Navarro, female   DOB: Aug 29, 1946, 69 y.o.   MRN: [id number]  HPI 69 yo G3P1 MWF here for complaint of increased left outer quadrant breast pain as well as increased nodularity over the last few weeks.  She cannot pinpoint exactly when this started.  Denies caffeine use.  No recent breast trauma.  No nipple discharge.  Pt reports she recently tried to go for screening MMG and reported the above issues and was advised to be seen.    Pt reports her daughter was recently diagnosed with DCIS which makes the pt more nervous about her own breast health.    Last MMG 11/14/13 with 3D was negative.    Pt does have hx of multiple breast biopsies including excisional biopsy on the right breast.  Review of Systems  All other systems reviewed and are negative.      Objective:   Physical Exam  Constitutional: She appears well-developed and well-nourished.  HENT:  Head: Normocephalic and atraumatic.  Neck: Normal range of motion. Neck supple. Thyromegaly (right lobe larger than left, no nodules) present.  Pulmonary/Chest: Right breast exhibits tenderness. Right breast exhibits no inverted nipple, no mass, no nipple discharge and no skin change. Left breast exhibits no inverted nipple, no mass, no nipple discharge, no skin change and no tenderness. Breasts are symmetrical.    Lymphadenopathy:    She has no cervical adenopathy.       Assessment:     Two small possible lymph nodes, RUOQ Increased breast tenderness Daughter with recent DCIS diagnosis and increased anxiety with pt     Plan:     Diagnosis MMG will be scheduled for pt.

## 2014-12-12 NOTE — Progress Notes (Signed)
Patient is scheduled for 3D Diagnostic Bilateral Mammogram and R Breast Ultrasound at the [facility] for 12/15/14 at 0830.

## 2014-12-12 NOTE — Telephone Encounter (Signed)
Pt says she need a referral for a mammogram.

## 2014-12-12 NOTE — Telephone Encounter (Signed)
Spoke with patient. Patient states "I went in for my screening mammogram and was unable to have it because I have felt some changes in my breast. I have dense breast but I feel the lumps are more pronounced." Denies any pain or discomfort in breasts. Advised will need to be seen for breast check in office with Dr.Miller. Patient is agreeable. Appointment scheduled for today at 2:30pm with Dr.Miller. Agreeable to date and time.  Routing to provider for final review. Patient agreeable to disposition. Will close encounter

## 2014-12-15 ENCOUNTER — Ambulatory Visit
Admission: RE | Admit: 2014-12-15 | Discharge: 2014-12-15 | Disposition: A | Discharge status: Home / Self Care | Payer: Medicare Other | Source: Ambulatory Visit | Attending: Obstetrics & Gynecology | Admitting: Obstetrics & Gynecology

## 2014-12-15 DIAGNOSIS — N6310 Unspecified lump in the right breast, unspecified quadrant: Secondary | ICD-10-CM

## 2014-12-15 DIAGNOSIS — N6459 Other signs and symptoms in breast: Secondary | ICD-10-CM | POA: Diagnosis not present

## 2014-12-18 ENCOUNTER — Telehealth: Payer: Self-pay | Admitting: Emergency Medicine

## 2014-12-18 NOTE — Telephone Encounter (Signed)
Spoke with patient. Denies problems with breast at this time. She is given message from Dr. Sabra Heck and verbalized understanding. She will call back if breast tenderness returns or any concerns. Routing to provider for final review. Patient agreeable to disposition. Will close encounter

## 2014-12-18 NOTE — Telephone Encounter (Signed)
-----   Message from Megan Salon, MD sent at 12/16/2014  1:13 PM EDT ----- Out of MMG hold.  Pt needs to call and be seen again if still having breast tenderness.  Could take Vit E 200 IU daily to see if will help with breast pain if desires.  Thanks.

## 2015-01-07 DIAGNOSIS — R21 Rash and other nonspecific skin eruption: Secondary | ICD-10-CM | POA: Diagnosis not present

## 2015-01-07 DIAGNOSIS — D692 Other nonthrombocytopenic purpura: Secondary | ICD-10-CM | POA: Diagnosis not present

## 2015-01-07 DIAGNOSIS — M779 Enthesopathy, unspecified: Secondary | ICD-10-CM | POA: Diagnosis not present

## 2015-01-07 DIAGNOSIS — R58 Hemorrhage, not elsewhere classified: Secondary | ICD-10-CM | POA: Diagnosis not present

## 2015-01-07 DIAGNOSIS — B351 Tinea unguium: Secondary | ICD-10-CM | POA: Diagnosis not present

## 2015-03-02 DIAGNOSIS — R51 Headache: Secondary | ICD-10-CM | POA: Diagnosis not present

## 2015-03-02 DIAGNOSIS — G43919 Migraine, unspecified, intractable, without status migrainosus: Secondary | ICD-10-CM | POA: Diagnosis not present

## 2015-03-02 DIAGNOSIS — G43719 Chronic migraine without aura, intractable, without status migrainosus: Secondary | ICD-10-CM | POA: Diagnosis not present

## 2015-03-09 ENCOUNTER — Other Ambulatory Visit: Payer: Self-pay

## 2015-03-19 ENCOUNTER — Other Ambulatory Visit: Payer: Self-pay | Admitting: Obstetrics & Gynecology

## 2015-03-19 NOTE — Telephone Encounter (Signed)
Medication refill request: Wellbutrin 150 mg  Last AEX:  02/25/14 with SM Next AEX: 03/26/15 with SM Last MMG (if hormonal medication request): n/a Refill authorized: #[id number] rfs

## 2015-03-19 NOTE — Telephone Encounter (Signed)
Called [facility] pharmacy and s/w associate she is aware that patient needs #30 only with no refills.

## 2015-03-26 ENCOUNTER — Ambulatory Visit (INDEPENDENT_AMBULATORY_CARE_PROVIDER_SITE_OTHER): Payer: Medicare Other | Admitting: Obstetrics & Gynecology

## 2015-03-27 ENCOUNTER — Ambulatory Visit (INDEPENDENT_AMBULATORY_CARE_PROVIDER_SITE_OTHER): Payer: Medicare Other | Admitting: Obstetrics & Gynecology

## 2015-03-27 ENCOUNTER — Encounter: Payer: Self-pay | Admitting: Obstetrics & Gynecology

## 2015-03-27 VITALS — BP 132/74 | HR 68 | Resp 16 | Ht 63.0 in | Wt 121.0 lb

## 2015-03-27 DIAGNOSIS — Z01419 Encounter for gynecological examination (general) (routine) without abnormal findings: Secondary | ICD-10-CM

## 2015-03-27 DIAGNOSIS — Z124 Encounter for screening for malignant neoplasm of cervix: Secondary | ICD-10-CM

## 2015-03-27 MED ORDER — ESTRADIOL 10 MCG VA TABS: 1.0000 | ORAL_TABLET | VAGINAL | Status: DC

## 2015-03-27 MED ORDER — BUPROPION HCL ER (XL) 150 MG PO TB24: 150.0000 mg | ORAL_TABLET | Freq: Every day | ORAL | Status: DC

## 2015-03-27 NOTE — Progress Notes (Signed)
69 y.o. [id number] MarriedCaucasianF here for annual exam.  Patient very frustrated with me about having to reschedule appt yesterday.    Reports having her bone density in march.  Feels like she was told this was improved and she has questions about Fosamax.  Chronic hx of diarrhea.  Pt seeing GI at  [facility].    Patient's last menstrual period was 12/14/1972.          Sexually active: Yes.    The current method of family planning is status post hysterectomy.    Exercising: Yes.    Walking and dance Smoker:  Former smoker  Health Maintenance: Pap:  12/24/09 WNL History of abnormal Pap:  no MMG:  12/15/14 3D Diag/US-BiRads 2-screening in one year Colonoscopy:  7/14-repeat in 3 years at [facility] BMD:   4/16-improved [place], discuss if should continue Fosamax TDaP:  2015 Screening Labs: PCP, Hb today: PCP, Urine today: PCP   reports that she quit smoking about 32 years ago. She has never used smokeless tobacco. She reports that she drinks about 8.4 oz of alcohol per week. She reports that she does not use illicit drugs.  Past Medical History  Diagnosis Date  . Hypertension   . Left thyroid nodule 2003    S/P biopsy - benign  . Fracture of right wrist 2011    Fell in bathroom.  . Osteopenia 2011  . Hx of migraines     l  . Lymphocytic colitis 7/14  . Visceral injury     detached due to recent fall    Past Surgical History  Procedure Laterality Date  . Vaginal hysterectomy  1974    Dysfunctional uterine bleeding.  Marland Kitchen Appendectomy  1962  . Breast surgery      S/P multiple breast biopsies.  . Hemorrhoid surgery  2000  . Dental implants  1993    Current Outpatient Prescriptions  Medication Sig Dispense Refill  . alendronate (FOSAMAX) 70 MG tablet Take 70 mg by mouth every 7 (seven) days. Take with a full glass of water on an empty stomach. Take on Sunday.    Marland Kitchen amLODipine (NORVASC) 5 MG tablet Take 5 mg by mouth daily.    . BUDESONIDE PO Take 3 mg by mouth as needed.    Marland Kitchen buPROPion  (WELLBUTRIN XL) 150 MG 24 hr tablet TAKE 1 TABLET BY MOUTH EVERY DAY 30 tablet 12  . calcium carbonate (OS-CAL) 600 MG TABS Take 600 mg by mouth 3 (three) times daily with meals.    . cholecalciferol (VITAMIN D) 1000 UNITS tablet Take 1,000 Units by mouth daily. Patient states dosage 1200 IU daily.    . Estradiol 10 MCG TABS vaginal tablet Place 1 tablet (10 mcg total) vaginally 2 (two) times a week. Take  twice a week. 8 tablet 12  . fluticasone (FLONASE) 50 MCG/ACT nasal spray Right nostril nightly  5  . losartan (COZAAR) 100 MG tablet   5  . magnesium oxide (MAG-OX) 400 MG tablet Take by mouth.    . Multiple Vitamin (MULTIVITAMIN) tablet Take 1 tablet by mouth daily.    . Omega-3 1000 MG CAPS Take by mouth.    . potassium chloride (K-DUR) 10 MEQ tablet Take 10 mEq by mouth 2 (two) times daily. Dosage not indicated by patient.  States she takes occasionally.    . terbinafine (LAMISIL) 250 MG tablet   0   No current facility-administered medications for this visit.    Family History  Problem Relation Age of Onset  .  Hypertension Mother   . Cancer Sister     melanoma  . Autoimmune disease Sister     crest and sjor  . Lupus Brother     ROS:  Pertinent items are noted in HPI.  Otherwise, a comprehensive ROS was negative.  Exam:   BP 132/74 mmHg  Pulse 68  Resp 16  Ht 5\' 3"  (1.6 m)  Wt 121 lb (54.885 kg)  BMI 21.44 kg/m2  LMP 12/14/1972  Weight change: @WEIGHTCHANGE @ Height:   Height: 5\' 3"  (160 cm)  Ht Readings from Last 3 Encounters:  03/27/15 5\' 3"  (1.6 m)  12/14/12 5' 2.5" (1.588 m)    General appearance: alert, cooperative and appears stated age Head: Normocephalic, without obvious abnormality, atraumatic Neck: no adenopathy, supple, symmetrical, trachea midline and thyroid normal to inspection and palpation Lungs: clear to auscultation bilaterally Breasts: normal appearance, no masses or tenderness, bilalteral implants Heart: regular rate and rhythm Abdomen: soft,  non-tender; bowel sounds normal; no masses,  no organomegaly Extremities: extremities normal, atraumatic, no cyanosis or edema Skin: Skin color, texture, turgor normal. No rashes or lesions Lymph nodes: Cervical, supraclavicular, and axillary nodes normal. No abnormal inguinal nodes palpated Neurologic: Grossly normal   Pelvic: External genitalia:  no lesions              Urethra:  normal appearing urethra with no masses, tenderness or lesions              Bartholins and Skenes: normal                 Vagina: normal appearing vagina with normal color and discharge, no lesions              Cervix: absent              Pap taken: No. Bimanual Exam:  Uterus:  uterus absent              Adnexa: no mass, fullness, tenderness               Rectovaginal: Confirms               Anus:  normal sphincter tone, no lesions  Chaperone was present for exam.  A:  Well Woman with normal exam H/O lymphocytic colitis. Follow due 2017. TVH age 68 due to DUB. Enlarged, symmetric thyroid. Sees endocrinology yearly.  Last PUS was 2015. Hypertension Depression Renal artery aneurysm  P: Mammogram yearly pap smear not indicated Continue Wellbutrin 150mg  XL daily.  Rx to pharmacy. Vagifem 90meq pv twice weekly. Rx to pharmacy. return annually or prn

## 2015-04-16 DIAGNOSIS — L230 Allergic contact dermatitis due to metals: Secondary | ICD-10-CM | POA: Diagnosis not present

## 2015-04-16 DIAGNOSIS — L700 Acne vulgaris: Secondary | ICD-10-CM | POA: Diagnosis not present

## 2015-04-16 DIAGNOSIS — L820 Inflamed seborrheic keratosis: Secondary | ICD-10-CM | POA: Diagnosis not present

## 2015-04-20 ENCOUNTER — Other Ambulatory Visit: Payer: Self-pay | Admitting: Internal Medicine

## 2015-04-20 ENCOUNTER — Ambulatory Visit
Admission: RE | Admit: 2015-04-20 | Discharge: 2015-04-20 | Disposition: A | Discharge status: Home / Self Care | Payer: Medicare Other | Source: Ambulatory Visit | Attending: Internal Medicine | Admitting: Internal Medicine

## 2015-04-20 DIAGNOSIS — E042 Nontoxic multinodular goiter: Secondary | ICD-10-CM | POA: Diagnosis not present

## 2015-04-23 ENCOUNTER — Encounter: Payer: Self-pay | Admitting: Obstetrics & Gynecology

## 2015-04-23 DIAGNOSIS — H2512 Age-related nuclear cataract, left eye: Secondary | ICD-10-CM | POA: Diagnosis not present

## 2015-04-23 DIAGNOSIS — H3531 Nonexudative age-related macular degeneration: Secondary | ICD-10-CM | POA: Diagnosis not present

## 2015-04-23 DIAGNOSIS — H35361 Drusen (degenerative) of macula, right eye: Secondary | ICD-10-CM | POA: Diagnosis not present

## 2015-04-23 DIAGNOSIS — H2511 Age-related nuclear cataract, right eye: Secondary | ICD-10-CM | POA: Diagnosis not present

## 2015-04-27 ENCOUNTER — Telehealth: Payer: Self-pay | Admitting: *Deleted

## 2015-04-27 NOTE — Telephone Encounter (Signed)
Calling patient to see where she had most recent bone density done. Patient states that she had it done at Dr. Lennette Bihari Navarro's office. Called Dr. Eddie Dibbles office and requested bone density be sent to my attention.

## 2015-05-01 NOTE — Telephone Encounter (Signed)
Bone Density has been received and given to [place] for review.

## 2015-05-05 DIAGNOSIS — L570 Actinic keratosis: Secondary | ICD-10-CM | POA: Diagnosis not present

## 2015-05-05 DIAGNOSIS — L820 Inflamed seborrheic keratosis: Secondary | ICD-10-CM | POA: Diagnosis not present

## 2015-05-07 ENCOUNTER — Telehealth: Payer: Self-pay

## 2015-05-07 NOTE — Telephone Encounter (Signed)
Lmtcb//kn 

## 2015-06-07 DIAGNOSIS — Z23 Encounter for immunization: Secondary | ICD-10-CM | POA: Diagnosis not present

## 2015-06-08 DIAGNOSIS — G43719 Chronic migraine without aura, intractable, without status migrainosus: Secondary | ICD-10-CM | POA: Diagnosis not present

## 2015-07-07 DIAGNOSIS — R202 Paresthesia of skin: Secondary | ICD-10-CM | POA: Diagnosis not present

## 2015-07-09 NOTE — Telephone Encounter (Signed)
Lmtcb//kn 

## 2015-07-10 NOTE — Telephone Encounter (Signed)
Patient is returning your call will be available at her home number until noon.

## 2015-07-10 NOTE — Telephone Encounter (Signed)
Lmtcb//kn 

## 2015-07-13 NOTE — Telephone Encounter (Signed)
Patient aware of results. See telephone note.//kn

## 2015-07-13 NOTE — Telephone Encounter (Signed)
Patient notified of results-states will finish taking Fosamax until the end of this year and will discuss with Dr Sabra Heck at next AEX.//kn

## 2015-08-11 DIAGNOSIS — B351 Tinea unguium: Secondary | ICD-10-CM | POA: Diagnosis not present

## 2015-08-11 DIAGNOSIS — R202 Paresthesia of skin: Secondary | ICD-10-CM | POA: Diagnosis not present

## 2015-08-11 DIAGNOSIS — L570 Actinic keratosis: Secondary | ICD-10-CM | POA: Diagnosis not present

## 2015-09-21 DIAGNOSIS — G43719 Chronic migraine without aura, intractable, without status migrainosus: Secondary | ICD-10-CM | POA: Diagnosis not present

## 2015-11-27 ENCOUNTER — Other Ambulatory Visit: Payer: Self-pay

## 2015-11-27 DIAGNOSIS — Z1231 Encounter for screening mammogram for malignant neoplasm of breast: Secondary | ICD-10-CM

## 2015-12-02 DIAGNOSIS — M899 Disorder of bone, unspecified: Secondary | ICD-10-CM | POA: Diagnosis not present

## 2015-12-02 DIAGNOSIS — G43909 Migraine, unspecified, not intractable, without status migrainosus: Secondary | ICD-10-CM | POA: Diagnosis not present

## 2015-12-02 DIAGNOSIS — Z0000 Encounter for general adult medical examination without abnormal findings: Secondary | ICD-10-CM | POA: Diagnosis not present

## 2015-12-02 DIAGNOSIS — E042 Nontoxic multinodular goiter: Secondary | ICD-10-CM | POA: Diagnosis not present

## 2015-12-02 DIAGNOSIS — I10 Essential (primary) hypertension: Secondary | ICD-10-CM | POA: Diagnosis not present

## 2015-12-02 DIAGNOSIS — M8580 Other specified disorders of bone density and structure, unspecified site: Secondary | ICD-10-CM | POA: Diagnosis not present

## 2015-12-02 DIAGNOSIS — R8299 Other abnormal findings in urine: Secondary | ICD-10-CM | POA: Diagnosis not present

## 2015-12-02 DIAGNOSIS — I722 Aneurysm of renal artery: Secondary | ICD-10-CM | POA: Diagnosis not present

## 2015-12-02 DIAGNOSIS — B351 Tinea unguium: Secondary | ICD-10-CM | POA: Diagnosis not present

## 2015-12-21 DIAGNOSIS — G43719 Chronic migraine without aura, intractable, without status migrainosus: Secondary | ICD-10-CM | POA: Diagnosis not present

## 2015-12-21 DIAGNOSIS — Z79899 Other long term (current) drug therapy: Secondary | ICD-10-CM | POA: Diagnosis not present

## 2015-12-22 ENCOUNTER — Ambulatory Visit
Admission: RE | Admit: 2015-12-22 | Discharge: 2015-12-22 | Disposition: A | Discharge status: Home / Self Care | Payer: Medicare Other | Source: Ambulatory Visit

## 2015-12-22 DIAGNOSIS — Z1231 Encounter for screening mammogram for malignant neoplasm of breast: Secondary | ICD-10-CM

## 2015-12-24 ENCOUNTER — Other Ambulatory Visit: Payer: Self-pay | Admitting: Obstetrics & Gynecology

## 2015-12-24 DIAGNOSIS — R928 Other abnormal and inconclusive findings on diagnostic imaging of breast: Secondary | ICD-10-CM

## 2015-12-30 ENCOUNTER — Ambulatory Visit
Admission: RE | Admit: 2015-12-30 | Discharge: 2015-12-30 | Disposition: A | Discharge status: Home / Self Care | Payer: Medicare Other | Source: Ambulatory Visit | Attending: Obstetrics & Gynecology | Admitting: Obstetrics & Gynecology

## 2015-12-30 DIAGNOSIS — R928 Other abnormal and inconclusive findings on diagnostic imaging of breast: Secondary | ICD-10-CM

## 2015-12-30 DIAGNOSIS — N6489 Other specified disorders of breast: Secondary | ICD-10-CM | POA: Diagnosis not present

## 2016-03-10 ENCOUNTER — Other Ambulatory Visit: Payer: Self-pay | Admitting: Obstetrics & Gynecology

## 2016-03-11 NOTE — Telephone Encounter (Signed)
Medication refill request: buPROPion 150mg  24hr Last AEX:  03/27/15 SM Next AEX: 06/24/16 Last MMG (if hormonal medication request): Refill authorized: 03/27/15 #90 w/4 refills; today #90 w/0 refill? Please advise

## 2016-04-11 DIAGNOSIS — G43719 Chronic migraine without aura, intractable, without status migrainosus: Secondary | ICD-10-CM | POA: Diagnosis not present

## 2016-04-20 DIAGNOSIS — E042 Nontoxic multinodular goiter: Secondary | ICD-10-CM | POA: Diagnosis not present

## 2016-04-29 ENCOUNTER — Ambulatory Visit (INDEPENDENT_AMBULATORY_CARE_PROVIDER_SITE_OTHER): Payer: Medicare Other

## 2016-04-29 ENCOUNTER — Ambulatory Visit (INDEPENDENT_AMBULATORY_CARE_PROVIDER_SITE_OTHER): Payer: Medicare Other | Admitting: Podiatry

## 2016-04-29 ENCOUNTER — Encounter: Payer: Self-pay | Admitting: Podiatry

## 2016-04-29 VITALS — BP 138/86 | HR 84 | Resp 12

## 2016-04-29 DIAGNOSIS — M79671 Pain in right foot: Secondary | ICD-10-CM

## 2016-04-29 DIAGNOSIS — L600 Ingrowing nail: Secondary | ICD-10-CM | POA: Diagnosis not present

## 2016-04-29 DIAGNOSIS — L609 Nail disorder, unspecified: Secondary | ICD-10-CM | POA: Diagnosis not present

## 2016-04-29 DIAGNOSIS — B351 Tinea unguium: Secondary | ICD-10-CM | POA: Diagnosis not present

## 2016-04-29 DIAGNOSIS — B350 Tinea barbae and tinea capitis: Secondary | ICD-10-CM | POA: Diagnosis not present

## 2016-04-29 NOTE — Progress Notes (Signed)
   Subjective:    Patient ID: Elaine Navarro, female    DOB: 12/01/1945, 70 y.o.   MRN: [id number]  HPI  Chief Complaint  Patient presents with  . Nail Problem    RT FOOT GREAT TOENAIL IS BEEN SORE/DISCOLORATION FOR ABOUT 5 DAYS. TOENAIL IS WORSE WHEN PUTTING PRESSURE ON IT. TRIED NO TREATMENT.     Review of Systems  Skin: Positive for color change.       Objective:   Physical Exam        Assessment & Plan:

## 2016-04-29 NOTE — Patient Instructions (Signed)

## 2016-05-02 NOTE — Progress Notes (Signed)
Subjective:     Patient ID: Elaine Navarro, female   DOB: 01-11-1946, 70 y.o.   MRN: [id number]  HPI patient presents with a painful ingrown toenail the right big toe and also history of fungus that she's concerned about with never having had accurate documentation that there may be fungal infiltration   Review of Systems  All other systems reviewed and are negative.      Objective:   Physical Exam  Constitutional: She is oriented to person, place, and time.  Cardiovascular: Intact distal pulses.   Musculoskeletal: Normal range of motion.  Neurological: She is oriented to person, place, and time.  Skin: Skin is warm.  Nursing note and vitals reviewed.  neurovascular status intact muscle strength adequate range of motion within normal limits with patient found to have incurvated right hallux lateral border that does have some irritation of the corner distal and has pain when palpated. Patient is found to have yellow discoloration but difficult to say whether or not this is fungal and orientation and is well oriented 3 with good digital perfusion     Assessment:     Ingrown toenail deformity right hallux lateral border with patient also noted to have mycotic component most likely    Plan:     H&P condition reviewed and I infiltrated the right hallux 60 mg Xylocaine Marcaine mixture remove the lateral corner and applied phenol to the base. I then took the corner and I am going to send it for pathological evaluation and biopsy to confirm no indications of melanoma and also to see whether fungal component is present. Before doing the procedure we discussed lidocaine and it does sound like it's more related epinephrine and she agrees she has not had any significant reactions

## 2016-05-24 ENCOUNTER — Other Ambulatory Visit: Payer: Self-pay | Admitting: Obstetrics & Gynecology

## 2016-05-24 ENCOUNTER — Telehealth: Payer: Self-pay | Admitting: *Deleted

## 2016-05-24 NOTE — Telephone Encounter (Signed)
Left message for patient at [phone] (Home #) to check to see how they were doing from their ingrown toenail procedure that was performed on Friday, April 29, 2016. Waiting for a response.

## 2016-05-26 ENCOUNTER — Ambulatory Visit (INDEPENDENT_AMBULATORY_CARE_PROVIDER_SITE_OTHER): Payer: Medicare Other | Admitting: Podiatry

## 2016-05-26 DIAGNOSIS — L600 Ingrowing nail: Secondary | ICD-10-CM | POA: Diagnosis not present

## 2016-05-26 DIAGNOSIS — M79671 Pain in right foot: Secondary | ICD-10-CM | POA: Diagnosis not present

## 2016-05-30 NOTE — Progress Notes (Signed)
Subjective:     Patient ID: Elaine Navarro, female   DOB: 03-05-1946, 70 y.o.   MRN: [id number]  HPI patient states I seem to be improving but I wanted to get everything checked   Review of Systems     Objective:   Physical Exam Neurovascular status intact muscle strength adequate with patient found to have inflammation and slight yellowness in the right big toenail but improved from where it was previously but still noted    Assessment:     Mild mycotic infection hallux nail    Plan:     Advised of improvement which has occurred up to this point and advised this patient on the continuation of topical medications with consideration long-term for Diflucan or possibly laser. Spent a great deal of time going over these different choices

## 2016-06-14 ENCOUNTER — Encounter: Payer: Self-pay | Admitting: Obstetrics & Gynecology

## 2016-06-14 ENCOUNTER — Ambulatory Visit (INDEPENDENT_AMBULATORY_CARE_PROVIDER_SITE_OTHER): Payer: Medicare Other | Admitting: Obstetrics & Gynecology

## 2016-06-14 VITALS — BP 112/78 | HR 74 | Resp 12 | Ht 63.0 in | Wt 111.2 lb

## 2016-06-14 DIAGNOSIS — Z205 Contact with and (suspected) exposure to viral hepatitis: Secondary | ICD-10-CM

## 2016-06-14 DIAGNOSIS — Z01419 Encounter for gynecological examination (general) (routine) without abnormal findings: Secondary | ICD-10-CM

## 2016-06-14 DIAGNOSIS — Z124 Encounter for screening for malignant neoplasm of cervix: Secondary | ICD-10-CM | POA: Diagnosis not present

## 2016-06-14 NOTE — Progress Notes (Signed)
70 y.o. [id number] MarriedCaucasianF here for annual exam.  Having some issues with ingrown toenails.  Took oral anti-fungal and she had a reaction to this that is now listed as an allergy.  Had lab work with Dr. Rex Kras this year.    Patient's last menstrual period was 12/14/1972.          Sexually active: Yes.    The current method of family planning is status post hysterectomy.    Exercising: Yes.    walking and swimming Smoker:  no  Health Maintenance: Pap:  12/14/09 negative History of abnormal Pap:  no MMG:  12/30/15 BIRADS 1 negative U/S same day BIRADS 1 negative  Colonoscopy:  03/19/13 normal.  Report says follow up 10 years but pt thinks it is five due to the colitis that was present. BMD:   12/04/14 at [facility] osteopenia  TDaP:  11/08/2010  Pneumonia vaccine(s): ~2 years  Zostavax:  completed Hep C testing: doing today Screening Labs: PCP, Hb today: PCP, Urine today: PCP   reports that she quit smoking about 33 years ago. She has never used smokeless tobacco. She reports that she drinks about 8.4 oz of alcohol per week . She reports that she does not use drugs.  Past Medical History:  Diagnosis Date  . Fracture of right wrist 2011   Fell in bathroom.  Marland Kitchen Hx of migraines    l  . Hypertension   . Left thyroid nodule 2003   S/P biopsy - benign  . Lymphocytic colitis 7/14  . Osteopenia 2011  . Visceral injury    detached due to recent fall    Past Surgical History:  Procedure Laterality Date  . APPENDECTOMY  1962  . BREAST SURGERY     S/P multiple breast biopsies.  . Dental implants  1993  . HEMORRHOID SURGERY  2000  . VAGINAL HYSTERECTOMY  1974   Dysfunctional uterine bleeding.    Family History  Problem Relation Age of Onset  . Hypertension Mother   . Cancer Sister     melanoma  . Autoimmune disease Sister     crest and sjor  . Lupus Brother     ROS:  Pertinent items are noted in HPI.  Otherwise, a comprehensive ROS was negative.  Exam:   Vitals:   06/14/16  1031  BP: 112/78  Pulse: 74  Resp: 12   General appearance: alert, cooperative and appears stated age Head: Normocephalic, without obvious abnormality, atraumatic Neck: no adenopathy, supple, symmetrical enlarged with nodule noted on left lower lobe Lungs: clear to auscultation bilaterally Breasts: normal appearance, no masses or tenderness Heart: regular rate and rhythm Abdomen: soft, non-tender; bowel sounds normal; no masses,  no organomegaly Extremities: extremities normal, atraumatic, no cyanosis or edema Skin: Skin color, texture, turgor normal. No rashes or lesions Lymph nodes: Cervical, supraclavicular, and axillary nodes normal. No abnormal inguinal nodes palpated Neurologic: Grossly normal   Pelvic: External genitalia:  no lesions              Urethra:  normal appearing urethra with no masses, tenderness or lesions              Bartholins and Skenes: normal                 Vagina: normal appearing vagina with normal color and discharge, no lesions              Cervix: absent  Pap taken: No. Bimanual Exam:  Uterus:  uterus absent              Adnexa: no mass, fullness, tenderness               Rectovaginal: Confirms               Anus:  normal sphincter tone, no lesions  Chaperone was present for exam.  A:     Well Woman with normal exam H/O lymphocytic colitis TVH age 59 due to DUB. Enlarged, symmetric thyroid with nodule. Does yearly ultrasound.  Followed by Dr. Buddy Duty. Depression Renal artery aneurysm Osteopenia   P: Mammogram yearly pap smear not indicated Continue Wellbutrin 150mg  XL daily.  Pharmacy will fax will need RFs. Hep C Antibody. Return annually or prn

## 2016-06-15 LAB — HEPATITIS C ANTIBODY: HCV Ab: NEGATIVE

## 2016-06-21 DIAGNOSIS — Z23 Encounter for immunization: Secondary | ICD-10-CM | POA: Diagnosis not present

## 2016-06-24 ENCOUNTER — Ambulatory Visit: Payer: Medicare Other | Admitting: Obstetrics & Gynecology

## 2016-07-04 DIAGNOSIS — G43719 Chronic migraine without aura, intractable, without status migrainosus: Secondary | ICD-10-CM | POA: Diagnosis not present

## 2016-09-08 DIAGNOSIS — R05 Cough: Secondary | ICD-10-CM | POA: Diagnosis not present

## 2016-10-03 DIAGNOSIS — Z79899 Other long term (current) drug therapy: Secondary | ICD-10-CM | POA: Diagnosis not present

## 2016-10-03 DIAGNOSIS — G43719 Chronic migraine without aura, intractable, without status migrainosus: Secondary | ICD-10-CM | POA: Diagnosis not present

## 2016-11-17 ENCOUNTER — Other Ambulatory Visit: Payer: Self-pay | Admitting: Obstetrics & Gynecology

## 2016-11-17 DIAGNOSIS — Z1231 Encounter for screening mammogram for malignant neoplasm of breast: Secondary | ICD-10-CM

## 2016-12-19 ENCOUNTER — Other Ambulatory Visit: Payer: Self-pay | Admitting: Obstetrics & Gynecology

## 2016-12-19 NOTE — Telephone Encounter (Signed)
Medication refill request: Bupropion Last AEX:  06/14/16 SM Next AEX: 09/29/17 SM Last MMG (if hormonal medication request): 12/22/15 BIRADS0, Density C, TBC; 12/30/15 Dx & U/S L Breast BIRADS1, TBC Refill authorized: 03/27/15 #90 4R. Please advise. Thank you.

## 2016-12-21 DIAGNOSIS — Z0000 Encounter for general adult medical examination without abnormal findings: Secondary | ICD-10-CM | POA: Diagnosis not present

## 2016-12-21 DIAGNOSIS — E042 Nontoxic multinodular goiter: Secondary | ICD-10-CM | POA: Diagnosis not present

## 2016-12-21 DIAGNOSIS — M85852 Other specified disorders of bone density and structure, left thigh: Secondary | ICD-10-CM | POA: Diagnosis not present

## 2016-12-21 DIAGNOSIS — I722 Aneurysm of renal artery: Secondary | ICD-10-CM | POA: Diagnosis not present

## 2016-12-21 DIAGNOSIS — Z8669 Personal history of other diseases of the nervous system and sense organs: Secondary | ICD-10-CM | POA: Diagnosis not present

## 2016-12-21 DIAGNOSIS — I10 Essential (primary) hypertension: Secondary | ICD-10-CM | POA: Diagnosis not present

## 2016-12-21 DIAGNOSIS — L299 Pruritus, unspecified: Secondary | ICD-10-CM | POA: Diagnosis not present

## 2016-12-21 DIAGNOSIS — R8290 Unspecified abnormal findings in urine: Secondary | ICD-10-CM | POA: Diagnosis not present

## 2016-12-26 DIAGNOSIS — G43719 Chronic migraine without aura, intractable, without status migrainosus: Secondary | ICD-10-CM | POA: Diagnosis not present

## 2017-01-02 ENCOUNTER — Ambulatory Visit
Admission: RE | Admit: 2017-01-02 | Discharge: 2017-01-02 | Disposition: A | Discharge status: Home / Self Care | Payer: Medicare Other | Source: Ambulatory Visit | Attending: Obstetrics & Gynecology | Admitting: Obstetrics & Gynecology

## 2017-01-02 DIAGNOSIS — Z1231 Encounter for screening mammogram for malignant neoplasm of breast: Secondary | ICD-10-CM | POA: Diagnosis not present

## 2017-02-19 DIAGNOSIS — J029 Acute pharyngitis, unspecified: Secondary | ICD-10-CM | POA: Diagnosis not present

## 2017-03-21 DIAGNOSIS — L729 Follicular cyst of the skin and subcutaneous tissue, unspecified: Secondary | ICD-10-CM | POA: Diagnosis not present

## 2017-03-21 DIAGNOSIS — I781 Nevus, non-neoplastic: Secondary | ICD-10-CM | POA: Diagnosis not present

## 2017-03-21 DIAGNOSIS — B351 Tinea unguium: Secondary | ICD-10-CM | POA: Diagnosis not present

## 2017-03-21 DIAGNOSIS — R202 Paresthesia of skin: Secondary | ICD-10-CM | POA: Diagnosis not present

## 2017-03-21 DIAGNOSIS — D229 Melanocytic nevi, unspecified: Secondary | ICD-10-CM | POA: Diagnosis not present

## 2017-03-21 DIAGNOSIS — L570 Actinic keratosis: Secondary | ICD-10-CM | POA: Insufficient documentation

## 2017-03-21 DIAGNOSIS — L281 Prurigo nodularis: Secondary | ICD-10-CM | POA: Diagnosis not present

## 2017-04-06 ENCOUNTER — Other Ambulatory Visit: Payer: Self-pay | Admitting: Internal Medicine

## 2017-04-06 DIAGNOSIS — E042 Nontoxic multinodular goiter: Secondary | ICD-10-CM

## 2017-04-10 DIAGNOSIS — G43719 Chronic migraine without aura, intractable, without status migrainosus: Secondary | ICD-10-CM | POA: Diagnosis not present

## 2017-04-13 DIAGNOSIS — K122 Cellulitis and abscess of mouth: Secondary | ICD-10-CM | POA: Diagnosis not present

## 2017-04-17 ENCOUNTER — Ambulatory Visit
Admission: RE | Admit: 2017-04-17 | Discharge: 2017-04-17 | Disposition: A | Discharge status: Home / Self Care | Payer: Medicare Other | Source: Ambulatory Visit | Attending: Internal Medicine | Admitting: Internal Medicine

## 2017-04-17 DIAGNOSIS — E042 Nontoxic multinodular goiter: Secondary | ICD-10-CM | POA: Diagnosis not present

## 2017-04-25 ENCOUNTER — Other Ambulatory Visit: Payer: Self-pay | Admitting: Internal Medicine

## 2017-04-25 DIAGNOSIS — E042 Nontoxic multinodular goiter: Secondary | ICD-10-CM

## 2017-04-25 DIAGNOSIS — G4700 Insomnia, unspecified: Secondary | ICD-10-CM | POA: Diagnosis not present

## 2017-04-25 DIAGNOSIS — R5383 Other fatigue: Secondary | ICD-10-CM | POA: Diagnosis not present

## 2017-06-08 DIAGNOSIS — Z23 Encounter for immunization: Secondary | ICD-10-CM | POA: Diagnosis not present

## 2017-07-10 DIAGNOSIS — Z79899 Other long term (current) drug therapy: Secondary | ICD-10-CM | POA: Diagnosis not present

## 2017-07-10 DIAGNOSIS — G43719 Chronic migraine without aura, intractable, without status migrainosus: Secondary | ICD-10-CM | POA: Diagnosis not present

## 2017-08-18 DIAGNOSIS — I722 Aneurysm of renal artery: Secondary | ICD-10-CM | POA: Diagnosis not present

## 2017-09-07 DIAGNOSIS — I722 Aneurysm of renal artery: Secondary | ICD-10-CM | POA: Diagnosis not present

## 2017-09-29 ENCOUNTER — Ambulatory Visit (INDEPENDENT_AMBULATORY_CARE_PROVIDER_SITE_OTHER): Payer: Medicare Other | Admitting: Obstetrics & Gynecology

## 2017-09-29 ENCOUNTER — Other Ambulatory Visit: Payer: Self-pay

## 2017-09-29 ENCOUNTER — Encounter: Payer: Self-pay | Admitting: Obstetrics & Gynecology

## 2017-09-29 VITALS — BP 115/74 | HR 80 | Resp 16 | Ht 63.0 in | Wt 118.0 lb

## 2017-09-29 DIAGNOSIS — Z124 Encounter for screening for malignant neoplasm of cervix: Secondary | ICD-10-CM | POA: Diagnosis not present

## 2017-09-29 DIAGNOSIS — Z01419 Encounter for gynecological examination (general) (routine) without abnormal findings: Secondary | ICD-10-CM

## 2017-09-29 NOTE — Progress Notes (Signed)
72 y.o. [id number] MarriedCaucasianF here for annual exam.  Doing well.  No vaginal bleeding.   Had CT scan done in December.  Had been 3+ years since prior CT scans.  Brother had dissecting aortic aneurysm and sister had CT showing aortic aneurysm.  This is being followed conservative.    PCP:  Dr. Rex Kras.  Has appt in the spring.  Has ultrasound scheduled for later this year with Dr. Buddy Duty.    Patient's last menstrual period was 12/14/1972.          Sexually active: Yes.    The current method of family planning is status post hysterectomy.  Exercising: Yes.    walking, weights Smoker:  no  Health Maintenance: Pap:  12/14/09 Neg  History of abnormal Pap:  no MMG:  01/02/17 BIRADS1:Neg  Colonoscopy:  03/19/13 normal. F/u 10 years  BMD:   12/04/14 Osteopenia  TDaP:  2012 Pneumonia vaccine(s):  Done Shingrix:   Completed x 2 Hep C testing: 06/14/16 Neg  Screening Labs: PCP   reports that she quit smoking about 34 years ago. she has never used smokeless tobacco. She reports that she drinks about 8.4 oz of alcohol per week. She reports that she does not use drugs.  Past Medical History:  Diagnosis Date  . Fracture of right wrist 2011   Fell in bathroom.  Marland Kitchen Hx of migraines    l  . Hypertension   . Left thyroid nodule 2003   S/P biopsy - benign  . Lymphocytic colitis 7/14  . Osteopenia 2011  . Visceral injury    detached due to recent fall    Past Surgical History:  Procedure Laterality Date  . APPENDECTOMY  1962  . BREAST EXCISIONAL BIOPSY Bilateral 2001  . BREAST SURGERY     S/P multiple breast biopsies.  . Dental implants  1993  . HEMORRHOID SURGERY  2000  . VAGINAL HYSTERECTOMY  1974   Dysfunctional uterine bleeding.    Current Outpatient Medications  Medication Sig Dispense Refill  . amLODipine (NORVASC) 5 MG tablet Take 5 mg by mouth daily.    . BUDESONIDE PO Take 3 mg by mouth as needed.    Marland Kitchen buPROPion (WELLBUTRIN XL) 150 MG 24 hr tablet TAKE 1 TABLET BY MOUTH EVERY DAY  30 tablet 12  . calcium carbonate (OS-CAL) 600 MG TABS Take 600 mg by mouth 3 (three) times daily with meals.    . cholecalciferol (VITAMIN D) 1000 UNITS tablet Take 1,000 Units by mouth daily. Patient states dosage 1200 IU daily.    . diphenhydrAMINE (BENADRYL) 25 mg capsule Take 25 mg by mouth.    . escitalopram (LEXAPRO) 10 MG tablet Take 1 tablet by mouth daily.    . fluocinonide cream (LIDEX) 0.05 % Apply to itchy skin of the upper back as needed.    . fluticasone (FLONASE) 50 MCG/ACT nasal spray Right nostril nightly  5  . losartan (COZAAR) 100 MG tablet   5  . magnesium oxide (MAG-OX) 400 MG tablet Take by mouth.    . Melatonin 5 MG TABS Take by mouth at bedtime.    . Multiple Vitamin (MULTIVITAMIN) tablet Take 1 tablet by mouth daily.    . Omega-3 1000 MG CAPS Take by mouth.    . potassium chloride (K-DUR) 10 MEQ tablet Take 10 mEq by mouth 2 (two) times daily. Dosage not indicated by patient.  States she takes occasionally.    . SUMAtriptan (IMITREX) 50 MG tablet Take by mouth as directed.    Marland Kitchen  vitamin B-12 (CYANOCOBALAMIN) 1000 MCG tablet Take by mouth daily.     No current facility-administered medications for this visit.     Family History  Problem Relation Age of Onset  . Hypertension Mother   . Cancer Sister        melanoma  . Autoimmune disease Sister        crest and sjor  . Lupus Brother   . Breast cancer Daughter     ROS:  Pertinent items are noted in HPI.  Otherwise, a comprehensive ROS was negative.  Exam:   BP 115/74 (BP Location: Right Arm, Patient Position: Sitting, Cuff Size: Normal)   Pulse 80   Resp 16   Ht 5\' 3"  (1.6 m)   Wt 118 lb (53.5 kg)   LMP 12/14/1972   BMI 20.90 kg/m    Height: 5\' 3"  (160 cm)  Ht Readings from Last 3 Encounters:  09/29/17 5\' 3"  (1.6 m)  06/14/16 5\' 3"  (1.6 m)  03/27/15 5\' 3"  (1.6 m)    General appearance: alert, cooperative and appears stated age Head: Normocephalic, without obvious abnormality, atraumatic Neck:  symmetrically enlarged thyroid with 3cm nodule noted on left side Lungs: clear to auscultation bilaterally Breasts: normal appearance, no masses or tenderness Heart: regular rate and rhythm Abdomen: soft, non-tender; bowel sounds normal; no masses,  no organomegaly Extremities: extremities normal, atraumatic, no cyanosis or edema Skin: Skin color, texture, turgor normal. No rashes or lesions Lymph nodes: Cervical, supraclavicular, and axillary nodes normal. No abnormal inguinal nodes palpated Neurologic: Grossly normal   Pelvic: External genitalia:  no lesions              Urethra:  normal appearing urethra with no masses, tenderness or lesions              Bartholins and Skenes: normal                 Vagina: normal appearing vagina with normal color and discharge, no lesions              Cervix: absent              Pap taken: No. Bimanual Exam:  Uterus:  uterus absent              Adnexa: normal adnexa               Rectovaginal: Confirms               Anus:  normal sphincter tone, no lesions  Chaperone was present for exam.  A:  Well Woman with normal exam PMP, no HRT H/O TV age 52 due to DUB H/O enlarged, symmetric thyroid with nodule.  Followed by Dr. Buddy Duty. Currently having yearly ultrasound.  Biopsy 7/12 showing non-neoplastic findings. Renal artery aneurysm.  Did have consultation with vascular surgeon at [facility] 12/18.  Being followed for now. Osteopenia Chronic migraines  P:   Mammogram guidelines reviewed.  Doing yearly. pap smear not indicated Rx for Wellbutrin 150mg  ZXL daily. Not needed.  Pharmacy will fax when needed. Lab work and vaccines UTD.  Shingrix completed x 2 last year Return annually or prn

## 2017-10-30 DIAGNOSIS — G43719 Chronic migraine without aura, intractable, without status migrainosus: Secondary | ICD-10-CM | POA: Diagnosis not present

## 2017-12-18 ENCOUNTER — Other Ambulatory Visit: Payer: Self-pay | Admitting: Obstetrics & Gynecology

## 2017-12-18 DIAGNOSIS — Z139 Encounter for screening, unspecified: Secondary | ICD-10-CM

## 2017-12-22 ENCOUNTER — Other Ambulatory Visit: Payer: Self-pay | Admitting: Obstetrics & Gynecology

## 2017-12-22 NOTE — Telephone Encounter (Signed)
Medication refill request: Wellbutrin Last AEX:  09-29-17 Next AEX: 12-06-18 Last MMG (if hormonal medication request): 01-02-09 [id number]. 01-12-18 Refill authorized: please advise

## 2017-12-29 DIAGNOSIS — I722 Aneurysm of renal artery: Secondary | ICD-10-CM | POA: Diagnosis not present

## 2018-01-04 DIAGNOSIS — Z86010 Personal history of colonic polyps: Secondary | ICD-10-CM | POA: Diagnosis not present

## 2018-01-04 DIAGNOSIS — Z0000 Encounter for general adult medical examination without abnormal findings: Secondary | ICD-10-CM | POA: Diagnosis not present

## 2018-01-04 DIAGNOSIS — R8290 Unspecified abnormal findings in urine: Secondary | ICD-10-CM | POA: Diagnosis not present

## 2018-01-04 DIAGNOSIS — M8580 Other specified disorders of bone density and structure, unspecified site: Secondary | ICD-10-CM | POA: Diagnosis not present

## 2018-01-04 DIAGNOSIS — E042 Nontoxic multinodular goiter: Secondary | ICD-10-CM | POA: Diagnosis not present

## 2018-01-04 DIAGNOSIS — M5416 Radiculopathy, lumbar region: Secondary | ICD-10-CM | POA: Diagnosis not present

## 2018-01-04 DIAGNOSIS — Z8669 Personal history of other diseases of the nervous system and sense organs: Secondary | ICD-10-CM | POA: Diagnosis not present

## 2018-01-04 DIAGNOSIS — I10 Essential (primary) hypertension: Secondary | ICD-10-CM | POA: Diagnosis not present

## 2018-01-12 ENCOUNTER — Ambulatory Visit
Admission: RE | Admit: 2018-01-12 | Discharge: 2018-01-12 | Disposition: A | Discharge status: Home / Self Care | Payer: Medicare Other | Source: Ambulatory Visit | Attending: Obstetrics & Gynecology | Admitting: Obstetrics & Gynecology

## 2018-01-12 DIAGNOSIS — Z1231 Encounter for screening mammogram for malignant neoplasm of breast: Secondary | ICD-10-CM | POA: Diagnosis not present

## 2018-01-12 DIAGNOSIS — Z139 Encounter for screening, unspecified: Secondary | ICD-10-CM

## 2018-01-22 DIAGNOSIS — G43719 Chronic migraine without aura, intractable, without status migrainosus: Secondary | ICD-10-CM | POA: Diagnosis not present

## 2018-03-08 DIAGNOSIS — M8588 Other specified disorders of bone density and structure, other site: Secondary | ICD-10-CM | POA: Diagnosis not present

## 2018-03-23 DIAGNOSIS — R202 Paresthesia of skin: Secondary | ICD-10-CM | POA: Diagnosis not present

## 2018-03-23 DIAGNOSIS — D229 Melanocytic nevi, unspecified: Secondary | ICD-10-CM | POA: Diagnosis not present

## 2018-03-23 DIAGNOSIS — B351 Tinea unguium: Secondary | ICD-10-CM | POA: Diagnosis not present

## 2018-03-23 DIAGNOSIS — I781 Nevus, non-neoplastic: Secondary | ICD-10-CM | POA: Diagnosis not present

## 2018-03-23 DIAGNOSIS — D1801 Hemangioma of skin and subcutaneous tissue: Secondary | ICD-10-CM | POA: Diagnosis not present

## 2018-04-23 DIAGNOSIS — G43719 Chronic migraine without aura, intractable, without status migrainosus: Secondary | ICD-10-CM | POA: Diagnosis not present

## 2018-04-23 DIAGNOSIS — Z79899 Other long term (current) drug therapy: Secondary | ICD-10-CM | POA: Diagnosis not present

## 2018-04-24 ENCOUNTER — Ambulatory Visit
Admission: RE | Admit: 2018-04-24 | Discharge: 2018-04-24 | Disposition: A | Discharge status: Home / Self Care | Payer: Medicare Other | Source: Ambulatory Visit | Attending: Internal Medicine | Admitting: Internal Medicine

## 2018-04-24 DIAGNOSIS — E042 Nontoxic multinodular goiter: Secondary | ICD-10-CM

## 2018-04-26 DIAGNOSIS — R5383 Other fatigue: Secondary | ICD-10-CM | POA: Diagnosis not present

## 2018-04-26 DIAGNOSIS — E042 Nontoxic multinodular goiter: Secondary | ICD-10-CM | POA: Diagnosis not present

## 2018-04-26 DIAGNOSIS — G4700 Insomnia, unspecified: Secondary | ICD-10-CM | POA: Diagnosis not present

## 2018-05-01 DIAGNOSIS — E042 Nontoxic multinodular goiter: Secondary | ICD-10-CM | POA: Diagnosis not present

## 2018-05-01 DIAGNOSIS — Z789 Other specified health status: Secondary | ICD-10-CM | POA: Diagnosis not present

## 2018-06-19 DIAGNOSIS — Z23 Encounter for immunization: Secondary | ICD-10-CM | POA: Diagnosis not present

## 2018-07-23 DIAGNOSIS — G43719 Chronic migraine without aura, intractable, without status migrainosus: Secondary | ICD-10-CM | POA: Diagnosis not present

## 2018-10-17 ENCOUNTER — Ambulatory Visit (INDEPENDENT_AMBULATORY_CARE_PROVIDER_SITE_OTHER): Payer: Medicare Other | Admitting: Podiatry

## 2018-10-17 DIAGNOSIS — M25512 Pain in left shoulder: Secondary | ICD-10-CM | POA: Diagnosis not present

## 2018-10-17 DIAGNOSIS — L600 Ingrowing nail: Secondary | ICD-10-CM | POA: Diagnosis not present

## 2018-10-17 MED ORDER — GENTAMICIN SULFATE 0.1 % EX CREA: 1.0000 "application " | TOPICAL_CREAM | Freq: Two times a day (BID) | CUTANEOUS | 1 refills | Status: DC

## 2018-10-17 NOTE — Patient Instructions (Signed)

## 2018-10-21 NOTE — Progress Notes (Signed)
   Subjective: Patient presents today for evaluation of stabbing, throbbing pain to the medial and lateral borders of the right great toe that began about 3-4 months ago. Patient is concerned for possible ingrown nail. Wearing shoes increases the pain. She has been trying to cut the nail out herself for treatment. Patient presents today for further treatment and evaluation.  Past Medical History:  Diagnosis Date  . Fracture of right wrist 2011   Fell in bathroom.  Marland Kitchen Hx of migraines    l  . Hypertension   . Left thyroid nodule 2003   S/P biopsy - benign  . Lymphocytic colitis 7/14  . Osteopenia 2011  . Visceral injury    detached due to recent fall    Objective:  General: Well developed, nourished, in no acute distress, alert and oriented x3   Dermatology: Skin is warm, dry and supple bilateral. Medial and lateral borders of the right great toe appears to be erythematous with evidence of an ingrowing nail. Pain on palpation noted to the border of the nail fold. The remaining nails appear unremarkable at this time. There are no open sores, lesions.  Vascular: Dorsalis Pedis artery and Posterior Tibial artery pedal pulses palpable. No lower extremity edema noted.   Neruologic: Grossly intact via light touch bilateral.  Musculoskeletal: Muscular strength within normal limits in all groups bilateral. Normal range of motion noted to all pedal and ankle joints.   Assesement: #1 Paronychia with ingrowing nail medial and lateral borders right hallux  #2 Pain in toe #3 Incurvated nail  Plan of Care:  1. Patient evaluated.  2. Discussed treatment alternatives and plan of care. Explained nail avulsion procedure and post procedure course to patient. 3. Patient opted for permanent partial nail avulsion of the medial and lateral borders of the right hallux.  4. Prior to procedure, local anesthesia infiltration utilized using 3 ml of a 50:50 mixture of 2% plain lidocaine and 0.5% plain  marcaine in a normal hallux block fashion and a betadine prep performed.  5. Partial permanent nail avulsion with chemical matrixectomy performed using [id number] applications of phenol followed by alcohol flush.  6. Light dressing applied. 7. Prescription for Gentamicin cream provided to patient to use daily with a bandage.  8. Return to clinic in 2 weeks.   Edrick Kins, DPM [facility]  Dr. Edrick Kins, [address]                                        [place],  [address]                Office [phone]  Fax [phone]

## 2018-10-24 DIAGNOSIS — M25512 Pain in left shoulder: Secondary | ICD-10-CM | POA: Diagnosis not present

## 2018-10-24 DIAGNOSIS — M7542 Impingement syndrome of left shoulder: Secondary | ICD-10-CM | POA: Diagnosis not present

## 2018-10-29 DIAGNOSIS — G43719 Chronic migraine without aura, intractable, without status migrainosus: Secondary | ICD-10-CM | POA: Diagnosis not present

## 2018-10-30 DIAGNOSIS — M25512 Pain in left shoulder: Secondary | ICD-10-CM | POA: Diagnosis not present

## 2018-10-30 DIAGNOSIS — M7542 Impingement syndrome of left shoulder: Secondary | ICD-10-CM | POA: Diagnosis not present

## 2018-10-31 ENCOUNTER — Ambulatory Visit (INDEPENDENT_AMBULATORY_CARE_PROVIDER_SITE_OTHER): Payer: Medicare Other | Admitting: Podiatry

## 2018-10-31 ENCOUNTER — Encounter: Payer: Self-pay | Admitting: Podiatry

## 2018-10-31 DIAGNOSIS — L600 Ingrowing nail: Secondary | ICD-10-CM

## 2018-11-01 DIAGNOSIS — M7542 Impingement syndrome of left shoulder: Secondary | ICD-10-CM | POA: Diagnosis not present

## 2018-11-01 DIAGNOSIS — M25512 Pain in left shoulder: Secondary | ICD-10-CM | POA: Diagnosis not present

## 2018-11-04 NOTE — Progress Notes (Signed)
   Subjective: Patient presents today 2 weeks post ingrown nail permanent nail avulsion procedure of the medial and lateral borders of the right great toe. She reports some continued tenderness and redness but denies any drainage. She has been using Gentamicin cream for treatment. She denies modifying factors. Patient is here for further evaluation and treatment.   Past Medical History:  Diagnosis Date  . Fracture of right wrist 2011   Fell in bathroom.  Marland Kitchen Hx of migraines    l  . Hypertension   . Left thyroid nodule 2003   S/P biopsy - benign  . Lymphocytic colitis 7/14  . Osteopenia 2011  . Visceral injury    detached due to recent fall    Objective: Skin is warm, dry and supple. Nail and respective nail fold appears to be healing appropriately. Open wound to the associated nail fold with a granular wound base and moderate amount of fibrotic tissue. Minimal drainage noted. Mild erythema around the periungual region likely due to phenol chemical matricectomy.  Assessment: #1 postop permanent partial nail avulsion medial and lateral border of the right hallux  #2 open wound periungual nail fold of respective digit.   Plan of care: #1 patient was evaluated  #2 debridement of open wound was performed to the periungual border of the respective toe using a currette. Antibiotic ointment and Band-Aid was applied. #3 patient is to return to clinic on a PRN basis.   Edrick Kins, DPM [facility]  Dr. Edrick Kins, [address]                                        [place], [place] [address]                Office [phone]  Fax [phone]

## 2018-11-06 DIAGNOSIS — M7542 Impingement syndrome of left shoulder: Secondary | ICD-10-CM | POA: Diagnosis not present

## 2018-11-06 DIAGNOSIS — M25512 Pain in left shoulder: Secondary | ICD-10-CM | POA: Diagnosis not present

## 2018-11-13 DIAGNOSIS — M25512 Pain in left shoulder: Secondary | ICD-10-CM | POA: Diagnosis not present

## 2018-11-13 DIAGNOSIS — M7542 Impingement syndrome of left shoulder: Secondary | ICD-10-CM | POA: Diagnosis not present

## 2018-11-15 DIAGNOSIS — M7542 Impingement syndrome of left shoulder: Secondary | ICD-10-CM | POA: Diagnosis not present

## 2018-11-15 DIAGNOSIS — M25512 Pain in left shoulder: Secondary | ICD-10-CM | POA: Diagnosis not present

## 2018-11-20 DIAGNOSIS — M25512 Pain in left shoulder: Secondary | ICD-10-CM | POA: Diagnosis not present

## 2018-11-20 DIAGNOSIS — M7542 Impingement syndrome of left shoulder: Secondary | ICD-10-CM | POA: Diagnosis not present

## 2018-11-22 DIAGNOSIS — M25512 Pain in left shoulder: Secondary | ICD-10-CM | POA: Diagnosis not present

## 2018-11-22 DIAGNOSIS — M7542 Impingement syndrome of left shoulder: Secondary | ICD-10-CM | POA: Diagnosis not present

## 2018-12-06 ENCOUNTER — Ambulatory Visit: Payer: Medicare Other | Admitting: Obstetrics & Gynecology

## 2018-12-26 ENCOUNTER — Other Ambulatory Visit: Payer: Self-pay | Admitting: Obstetrics & Gynecology

## 2018-12-26 DIAGNOSIS — Z1231 Encounter for screening mammogram for malignant neoplasm of breast: Secondary | ICD-10-CM

## 2018-12-28 ENCOUNTER — Other Ambulatory Visit: Payer: Self-pay | Admitting: Obstetrics & Gynecology

## 2018-12-28 NOTE — Telephone Encounter (Signed)
Medication refill request: wellbutrin  Last AEX:  09/29/17 SM Next AEX: none  Last MMG (if hormonal medication request): 01/12/18 BIRADS1:Neg  Refill authorized: 12/25/17 #30/12R. Today please advise.

## 2019-01-04 DIAGNOSIS — I10 Essential (primary) hypertension: Secondary | ICD-10-CM | POA: Diagnosis not present

## 2019-01-10 DIAGNOSIS — I722 Aneurysm of renal artery: Secondary | ICD-10-CM | POA: Diagnosis not present

## 2019-01-10 DIAGNOSIS — M8580 Other specified disorders of bone density and structure, unspecified site: Secondary | ICD-10-CM | POA: Diagnosis not present

## 2019-01-10 DIAGNOSIS — I10 Essential (primary) hypertension: Secondary | ICD-10-CM | POA: Diagnosis not present

## 2019-01-10 DIAGNOSIS — Z86010 Personal history of colonic polyps: Secondary | ICD-10-CM | POA: Diagnosis not present

## 2019-01-10 DIAGNOSIS — R899 Unspecified abnormal finding in specimens from other organs, systems and tissues: Secondary | ICD-10-CM | POA: Diagnosis not present

## 2019-01-10 DIAGNOSIS — E042 Nontoxic multinodular goiter: Secondary | ICD-10-CM | POA: Diagnosis not present

## 2019-01-10 DIAGNOSIS — Z8669 Personal history of other diseases of the nervous system and sense organs: Secondary | ICD-10-CM | POA: Diagnosis not present

## 2019-01-28 DIAGNOSIS — G43719 Chronic migraine without aura, intractable, without status migrainosus: Secondary | ICD-10-CM | POA: Diagnosis not present

## 2019-02-06 ENCOUNTER — Other Ambulatory Visit: Payer: Self-pay

## 2019-02-08 ENCOUNTER — Other Ambulatory Visit: Payer: Self-pay

## 2019-02-08 ENCOUNTER — Encounter: Payer: Self-pay | Admitting: Obstetrics & Gynecology

## 2019-02-08 ENCOUNTER — Ambulatory Visit (INDEPENDENT_AMBULATORY_CARE_PROVIDER_SITE_OTHER): Payer: Medicare Other | Admitting: Obstetrics & Gynecology

## 2019-02-08 VITALS — BP 130/80 | HR 64 | Temp 97.6°F | Ht 63.0 in | Wt 118.0 lb

## 2019-02-08 DIAGNOSIS — Z01419 Encounter for gynecological examination (general) (routine) without abnormal findings: Secondary | ICD-10-CM

## 2019-02-08 DIAGNOSIS — Z124 Encounter for screening for malignant neoplasm of cervix: Secondary | ICD-10-CM | POA: Diagnosis not present

## 2019-02-08 MED ORDER — BUPROPION HCL ER (XL) 150 MG PO TB24: 150.0000 mg | ORAL_TABLET | Freq: Every day | ORAL | 4 refills | Status: DC

## 2019-02-08 NOTE — Progress Notes (Signed)
73 y.o. [id number] Married White or Caucasian female here for annual exam.  Son in law and daughter both has Covid.  They live in [place].  Denies vaginal bleeding.  Had blood work in April.  WBC was 3.9.  Has follow up scheduled in 1 months.    Seeing Dr. Dwyane Dee for migraines.  Had botox injections every 3 months.    Has follow up with Dr. Toy Care scheduled.  Saw Dr. Rex Kras in April.  Blood work showed WBC ct 3.9.  Has repeat lab scheduled next month.  Questions regarding this answered.    Patient's last menstrual period was 12/14/1972.          Sexually active: Yes.    The current method of family planning is status post hysterectomy.    Exercising: Yes.    walk 3 miles 4 x weekly, dancing  Smoker:  no  Health Maintenance: Pap:  2011 Neg  History of abnormal Pap:  no MMG:  01/12/18 BIRADS1:neg. Has appt 02/22/19 Colonoscopy:  2014 f/u 10 years  BMD:   12/04/14  TDaP:  2012 Pneumonia vaccine(s):  Done  Shingrix:   Done  Hep C testing: 06/14/16 Neg Screening Labs: PCP   reports that she quit smoking about 36 years ago. She has never used smokeless tobacco. She reports current alcohol use of about 14.0 standard drinks of alcohol per week. She reports that she does not use drugs.  Past Medical History:  Diagnosis Date  . Fracture of right wrist 2011   Fell in bathroom.  Marland Kitchen Hx of migraines    l  . Hypertension   . Left thyroid nodule 2003   S/P biopsy - benign  . Lymphocytic colitis 7/14  . Osteopenia 2011  . Visceral injury    detached due to recent fall    Past Surgical History:  Procedure Laterality Date  . APPENDECTOMY  1962  . BREAST EXCISIONAL BIOPSY Bilateral 2001  . BREAST SURGERY     S/P multiple breast biopsies.  . Dental implants  1993  . HEMORRHOID SURGERY  2000  . VAGINAL HYSTERECTOMY  1974   Dysfunctional uterine bleeding.    Current Outpatient Medications  Medication Sig Dispense Refill  . amLODipine (NORVASC) 5 MG tablet Take 5 mg by mouth daily.    .  BUDESONIDE PO Take 3 mg by mouth as needed.    Marland Kitchen buPROPion (WELLBUTRIN XL) 150 MG 24 hr tablet TAKE 1 TABLET BY MOUTH EVERY DAY 30 tablet 1  . calcium carbonate (OS-CAL) 600 MG TABS Take 600 mg by mouth 3 (three) times daily with meals.    . cholecalciferol (VITAMIN D) 1000 UNITS tablet Take 1,000 Units by mouth daily. Patient states dosage 1200 IU daily.    . diphenhydrAMINE (BENADRYL) 25 mg capsule Take 25 mg by mouth.    . escitalopram (LEXAPRO) 10 MG tablet Take 1 tablet by mouth daily.    . fluocinonide cream (LIDEX) 0.05 % Apply to itchy skin of the upper back as needed.    . fluticasone (FLONASE) 50 MCG/ACT nasal spray Right nostril nightly  5  . losartan (COZAAR) 100 MG tablet   5  . magnesium oxide (MAG-OX) 400 MG tablet Take by mouth.    . Melatonin 5 MG TABS Take by mouth at bedtime.    . Multiple Vitamin (MULTIVITAMIN) tablet Take 1 tablet by mouth daily.    . Omega-3 1000 MG CAPS Take by mouth.    . potassium chloride (K-DUR) 10 MEQ tablet Take  10 mEq by mouth 2 (two) times daily. Dosage not indicated by patient.  States she takes occasionally.    . SUMAtriptan (IMITREX) 50 MG tablet Take by mouth as directed.    . vitamin B-12 (CYANOCOBALAMIN) 1000 MCG tablet Take by mouth daily.     No current facility-administered medications for this visit.     Family History  Problem Relation Age of Onset  . Hypertension Mother   . Cancer Sister        melanoma  . Autoimmune disease Sister        crest and sjor  . Lupus Brother   . Breast cancer Daughter     Review of Systems  All other systems reviewed and are negative.   Exam:   BP 130/80   Pulse 64   Temp 97.6 F (36.4 C) (Temporal)   Ht 5\' 3"  (1.6 m)   Wt 118 lb (53.5 kg)   LMP 12/14/1972   BMI 20.90 kg/m   Height:   Height: 5\' 3"  (160 cm)  Ht Readings from Last 3 Encounters:  02/08/19 5\' 3"  (1.6 m)  09/29/17 5\' 3"  (1.6 m)  06/14/16 5\' 3"  (1.6 m)    General appearance: alert, cooperative and appears stated  age Head: Normocephalic, without obvious abnormality, atraumatic Neck: no adenopathy, supple, symmetrical, trachea midline and thyroid enlarged with 3cm nodule in left lobe note  Lungs: clear to auscultation bilaterally Breasts: normal appearance, no masses or tenderness Heart: regular rate and rhythm Abdomen: soft, non-tender; bowel sounds normal; no masses,  no organomegaly Extremities: extremities normal, atraumatic, no cyanosis or edema Skin: Skin color, texture, turgor normal. No rashes or lesions Lymph nodes: Cervical, supraclavicular, and axillary nodes normal. No abnormal inguinal nodes palpated Neurologic: Grossly normal  Pelvic: External genitalia:  no lesions              Urethra:  normal appearing urethra with no masses, tenderness or lesions              Bartholins and Skenes: normal                 Vagina: normal appearing vagina with normal color and discharge, no lesions              Cervix: absent              Pap taken: No. Bimanual Exam:  Uterus:  uterus absent              Adnexa: no mass, fullness, tenderness               Rectovaginal: Confirms               Anus:  normal sphincter tone, no lesions  Chaperone was present for exam.  A:  Well Woman with normal exam PMP, no HRT H/o TVH due to DUB H/O enlarged, symmetric thyroid with nodule.  Followed by Dr. Buddy Duty with yearly ultrasound.  (Biopsy 7/12 with negative cytology) Renal artery aneurysm.  Had conslutation with vascular sugeon at [facility] 12/18. Osteopenia Migraines, receiving every 3 month Botox Mildly decrease WBC ct  P:   Mammogram guidelines reviewed.  Doing yearly. pap smear not indicated Rx for Wellbutrin 150mg  XL daily.  #90/4RF.   Lab work done with Dr. Rex Kras in April.  Will have follow up CBC in June Vaccines are all UTD Return annually or prn

## 2019-02-12 DIAGNOSIS — I10 Essential (primary) hypertension: Secondary | ICD-10-CM | POA: Diagnosis not present

## 2019-02-12 DIAGNOSIS — M8580 Other specified disorders of bone density and structure, unspecified site: Secondary | ICD-10-CM | POA: Diagnosis not present

## 2019-02-12 DIAGNOSIS — I722 Aneurysm of renal artery: Secondary | ICD-10-CM | POA: Diagnosis not present

## 2019-02-22 ENCOUNTER — Ambulatory Visit
Admission: RE | Admit: 2019-02-22 | Discharge: 2019-02-22 | Disposition: A | Discharge status: Home / Self Care | Payer: Medicare Other | Source: Ambulatory Visit | Attending: Obstetrics & Gynecology | Admitting: Obstetrics & Gynecology

## 2019-02-22 ENCOUNTER — Other Ambulatory Visit: Payer: Self-pay

## 2019-02-22 DIAGNOSIS — Z1231 Encounter for screening mammogram for malignant neoplasm of breast: Secondary | ICD-10-CM | POA: Diagnosis not present

## 2019-03-12 DIAGNOSIS — Z8669 Personal history of other diseases of the nervous system and sense organs: Secondary | ICD-10-CM | POA: Diagnosis not present

## 2019-03-12 DIAGNOSIS — I722 Aneurysm of renal artery: Secondary | ICD-10-CM | POA: Diagnosis not present

## 2019-03-12 DIAGNOSIS — R899 Unspecified abnormal finding in specimens from other organs, systems and tissues: Secondary | ICD-10-CM | POA: Diagnosis not present

## 2019-03-12 DIAGNOSIS — M8580 Other specified disorders of bone density and structure, unspecified site: Secondary | ICD-10-CM | POA: Diagnosis not present

## 2019-03-12 DIAGNOSIS — I10 Essential (primary) hypertension: Secondary | ICD-10-CM | POA: Diagnosis not present

## 2019-03-12 DIAGNOSIS — Z86010 Personal history of colonic polyps: Secondary | ICD-10-CM | POA: Diagnosis not present

## 2019-03-12 DIAGNOSIS — E042 Nontoxic multinodular goiter: Secondary | ICD-10-CM | POA: Diagnosis not present

## 2019-03-18 ENCOUNTER — Ambulatory Visit (INDEPENDENT_AMBULATORY_CARE_PROVIDER_SITE_OTHER): Payer: Medicare Other | Admitting: Podiatry

## 2019-03-18 ENCOUNTER — Encounter: Payer: Self-pay | Admitting: Podiatry

## 2019-03-18 ENCOUNTER — Other Ambulatory Visit: Payer: Self-pay

## 2019-03-18 VITALS — Temp 98.0°F

## 2019-03-18 DIAGNOSIS — L600 Ingrowing nail: Secondary | ICD-10-CM | POA: Diagnosis not present

## 2019-03-18 NOTE — Patient Instructions (Signed)

## 2019-03-20 NOTE — Progress Notes (Signed)
   Subjective: Patient presents today for evaluation of pain to the medial border of the right hallux that began a few months ago. Patient is concerned for possible ingrown nail. She states the nail came off yesterday. She reports associated redness and drainage that began about six weeks ago. She has been soaking the foot in Epsom salt for treatment. There are no modifying factors noted. Patient presents today for further treatment and evaluation.  Past Medical History:  Diagnosis Date  . Fracture of right wrist 2011   Fell in bathroom.  Marland Kitchen Hx of migraines    l  . Hypertension   . Left thyroid nodule 2003   S/P biopsy - benign  . Lymphocytic colitis 7/14  . Osteopenia 2011  . Visceral injury    detached due to recent fall    Objective:  General: Well developed, nourished, in no acute distress, alert and oriented x3   Dermatology: Skin is warm, dry and supple bilateral. Medial border of the right hallux appears to be erythematous with evidence of an ingrowing nail. Pain on palpation noted to the border of the nail fold. The remaining nails appear unremarkable at this time. There are no open sores, lesions.  Vascular: Dorsalis Pedis artery and Posterior Tibial artery pedal pulses palpable. No lower extremity edema noted.   Neruologic: Grossly intact via light touch bilateral.  Musculoskeletal: Muscular strength within normal limits in all groups bilateral. Normal range of motion noted to all pedal and ankle joints.   Assesement: #1 Paronychia with ingrowing nail medial border right hallux - recurrent  #2 Pain in toe #3 Incurvated nail  Plan of Care:  1. Patient evaluated.  2. Discussed treatment alternatives and plan of care. Explained nail avulsion procedure and post procedure course to patient. 3. Patient opted for permanent partial nail avulsion of the medial border of the right hallux.  4. Prior to procedure, local anesthesia infiltration utilized using 3 ml of a 50:50  mixture of 2% plain lidocaine and 0.5% plain marcaine in a normal hallux block fashion and a betadine prep performed.  5. Partial permanent nail avulsion with chemical matrixectomy performed using [id number] applications of phenol followed by alcohol flush.  6. Light dressing applied. 7. Resume using gentamicin cream as directed.  8. Return to clinic in 2 weeks.   Does professional ballroom dancing.   Edrick Kins, DPM [facility]  Dr. Edrick Kins, [address]                                        [place], [place] [address]                Office [phone]  Fax [phone]

## 2019-03-26 DIAGNOSIS — Z79899 Other long term (current) drug therapy: Secondary | ICD-10-CM | POA: Diagnosis not present

## 2019-03-26 DIAGNOSIS — R202 Paresthesia of skin: Secondary | ICD-10-CM | POA: Diagnosis not present

## 2019-03-26 DIAGNOSIS — D229 Melanocytic nevi, unspecified: Secondary | ICD-10-CM | POA: Diagnosis not present

## 2019-03-26 DIAGNOSIS — B351 Tinea unguium: Secondary | ICD-10-CM | POA: Diagnosis not present

## 2019-03-26 DIAGNOSIS — D1801 Hemangioma of skin and subcutaneous tissue: Secondary | ICD-10-CM | POA: Diagnosis not present

## 2019-04-01 ENCOUNTER — Ambulatory Visit (INDEPENDENT_AMBULATORY_CARE_PROVIDER_SITE_OTHER): Payer: Medicare Other | Admitting: Podiatry

## 2019-04-01 ENCOUNTER — Other Ambulatory Visit: Payer: Self-pay

## 2019-04-01 VITALS — Temp 97.6°F

## 2019-04-01 DIAGNOSIS — L600 Ingrowing nail: Secondary | ICD-10-CM | POA: Diagnosis not present

## 2019-04-03 NOTE — Progress Notes (Signed)
   Subjective: 73 y.o. female presents today status post permanent nail avulsion procedure of the medial border of the right hallux that was performed on 03/18/2019. She states she is doing well. She denies any significant pain, drainage or modifying factors. She has been applying Gentamicin cream as directed. There are no worsening factors noted. Patient is here for further evaluation and treatment.    Past Medical History:  Diagnosis Date  . Fracture of right wrist 2011   Fell in bathroom.  Marland Kitchen Hx of migraines    l  . Hypertension   . Left thyroid nodule 2003   S/P biopsy - benign  . Lymphocytic colitis 7/14  . Osteopenia 2011  . Visceral injury    detached due to recent fall    Objective: Skin is warm, dry and supple. Nail and respective nail fold appears to be healing appropriately. Open wound to the associated nail fold with a granular wound base and moderate amount of fibrotic tissue. Minimal drainage noted. Mild erythema around the periungual region likely due to phenol chemical matricectomy.  Assessment: #1 postop permanent partial nail avulsion medial border right hallux #2 open wound periungual nail fold of respective digit.   Plan of care: #1 patient was evaluated  #2 debridement of open wound was performed to the periungual border of the respective toe using a currette. Antibiotic ointment and Band-Aid was applied. #3 patient is to return to clinic on a PRN basis.   Edrick Kins, DPM [facility]  Dr. Edrick Kins, [address]                                        [place], [place] [address]                Office [phone]  Fax [phone]

## 2019-04-16 ENCOUNTER — Other Ambulatory Visit: Payer: Self-pay | Admitting: Internal Medicine

## 2019-04-16 DIAGNOSIS — E042 Nontoxic multinodular goiter: Secondary | ICD-10-CM

## 2019-04-18 ENCOUNTER — Ambulatory Visit
Admission: RE | Admit: 2019-04-18 | Discharge: 2019-04-18 | Disposition: A | Discharge status: Home / Self Care | Payer: Medicare Other | Source: Ambulatory Visit | Attending: Internal Medicine | Admitting: Internal Medicine

## 2019-04-18 ENCOUNTER — Other Ambulatory Visit: Payer: Self-pay

## 2019-04-18 DIAGNOSIS — E041 Nontoxic single thyroid nodule: Secondary | ICD-10-CM | POA: Diagnosis not present

## 2019-04-18 DIAGNOSIS — E042 Nontoxic multinodular goiter: Secondary | ICD-10-CM

## 2019-04-29 DIAGNOSIS — G43719 Chronic migraine without aura, intractable, without status migrainosus: Secondary | ICD-10-CM | POA: Diagnosis not present

## 2019-04-29 DIAGNOSIS — Z79899 Other long term (current) drug therapy: Secondary | ICD-10-CM | POA: Diagnosis not present

## 2019-05-03 DIAGNOSIS — E538 Deficiency of other specified B group vitamins: Secondary | ICD-10-CM | POA: Diagnosis not present

## 2019-05-03 DIAGNOSIS — E042 Nontoxic multinodular goiter: Secondary | ICD-10-CM | POA: Diagnosis not present

## 2019-05-03 DIAGNOSIS — Z789 Other specified health status: Secondary | ICD-10-CM | POA: Diagnosis not present

## 2019-05-09 DIAGNOSIS — K120 Recurrent oral aphthae: Secondary | ICD-10-CM | POA: Diagnosis not present

## 2019-05-09 DIAGNOSIS — Z789 Other specified health status: Secondary | ICD-10-CM | POA: Diagnosis not present

## 2019-05-09 DIAGNOSIS — E042 Nontoxic multinodular goiter: Secondary | ICD-10-CM | POA: Diagnosis not present

## 2019-05-09 DIAGNOSIS — R21 Rash and other nonspecific skin eruption: Secondary | ICD-10-CM | POA: Diagnosis not present

## 2019-05-27 DIAGNOSIS — Z23 Encounter for immunization: Secondary | ICD-10-CM | POA: Diagnosis not present

## 2019-06-06 DIAGNOSIS — Z0000 Encounter for general adult medical examination without abnormal findings: Secondary | ICD-10-CM | POA: Diagnosis not present

## 2019-06-10 DIAGNOSIS — M545 Low back pain: Secondary | ICD-10-CM | POA: Diagnosis not present

## 2019-06-10 DIAGNOSIS — M9903 Segmental and somatic dysfunction of lumbar region: Secondary | ICD-10-CM | POA: Diagnosis not present

## 2019-06-10 DIAGNOSIS — M5136 Other intervertebral disc degeneration, lumbar region: Secondary | ICD-10-CM | POA: Diagnosis not present

## 2019-06-13 DIAGNOSIS — Z0000 Encounter for general adult medical examination without abnormal findings: Secondary | ICD-10-CM | POA: Diagnosis not present

## 2019-06-13 DIAGNOSIS — Z8669 Personal history of other diseases of the nervous system and sense organs: Secondary | ICD-10-CM | POA: Diagnosis not present

## 2019-06-13 DIAGNOSIS — M8580 Other specified disorders of bone density and structure, unspecified site: Secondary | ICD-10-CM | POA: Diagnosis not present

## 2019-06-13 DIAGNOSIS — I722 Aneurysm of renal artery: Secondary | ICD-10-CM | POA: Diagnosis not present

## 2019-06-13 DIAGNOSIS — R8290 Unspecified abnormal findings in urine: Secondary | ICD-10-CM | POA: Diagnosis not present

## 2019-06-13 DIAGNOSIS — Z1159 Encounter for screening for other viral diseases: Secondary | ICD-10-CM | POA: Diagnosis not present

## 2019-06-13 DIAGNOSIS — M2550 Pain in unspecified joint: Secondary | ICD-10-CM | POA: Diagnosis not present

## 2019-06-13 DIAGNOSIS — M5416 Radiculopathy, lumbar region: Secondary | ICD-10-CM | POA: Diagnosis not present

## 2019-06-13 DIAGNOSIS — I10 Essential (primary) hypertension: Secondary | ICD-10-CM | POA: Diagnosis not present

## 2019-06-13 DIAGNOSIS — E042 Nontoxic multinodular goiter: Secondary | ICD-10-CM | POA: Diagnosis not present

## 2019-06-16 DIAGNOSIS — M25561 Pain in right knee: Secondary | ICD-10-CM | POA: Diagnosis not present

## 2019-06-16 DIAGNOSIS — S8391XA Sprain of unspecified site of right knee, initial encounter: Secondary | ICD-10-CM | POA: Diagnosis not present

## 2019-06-18 DIAGNOSIS — Z1159 Encounter for screening for other viral diseases: Secondary | ICD-10-CM | POA: Diagnosis not present

## 2019-06-18 DIAGNOSIS — E042 Nontoxic multinodular goiter: Secondary | ICD-10-CM | POA: Diagnosis not present

## 2019-06-18 DIAGNOSIS — Z0000 Encounter for general adult medical examination without abnormal findings: Secondary | ICD-10-CM | POA: Diagnosis not present

## 2019-06-18 DIAGNOSIS — R8290 Unspecified abnormal findings in urine: Secondary | ICD-10-CM | POA: Diagnosis not present

## 2019-06-18 DIAGNOSIS — I10 Essential (primary) hypertension: Secondary | ICD-10-CM | POA: Diagnosis not present

## 2019-06-18 DIAGNOSIS — S86819A Strain of other muscle(s) and tendon(s) at lower leg level, unspecified leg, initial encounter: Secondary | ICD-10-CM | POA: Diagnosis not present

## 2019-06-18 DIAGNOSIS — M5416 Radiculopathy, lumbar region: Secondary | ICD-10-CM | POA: Diagnosis not present

## 2019-06-18 DIAGNOSIS — R899 Unspecified abnormal finding in specimens from other organs, systems and tissues: Secondary | ICD-10-CM | POA: Diagnosis not present

## 2019-06-18 DIAGNOSIS — M8580 Other specified disorders of bone density and structure, unspecified site: Secondary | ICD-10-CM | POA: Diagnosis not present

## 2019-06-18 DIAGNOSIS — I722 Aneurysm of renal artery: Secondary | ICD-10-CM | POA: Diagnosis not present

## 2019-06-18 DIAGNOSIS — M2391 Unspecified internal derangement of right knee: Secondary | ICD-10-CM | POA: Diagnosis not present

## 2019-06-18 DIAGNOSIS — Z8669 Personal history of other diseases of the nervous system and sense organs: Secondary | ICD-10-CM | POA: Diagnosis not present

## 2019-06-18 DIAGNOSIS — S8991XA Unspecified injury of right lower leg, initial encounter: Secondary | ICD-10-CM | POA: Diagnosis not present

## 2019-06-18 DIAGNOSIS — M2550 Pain in unspecified joint: Secondary | ICD-10-CM | POA: Diagnosis not present

## 2019-06-19 DIAGNOSIS — M25561 Pain in right knee: Secondary | ICD-10-CM | POA: Diagnosis not present

## 2019-06-19 DIAGNOSIS — M25461 Effusion, right knee: Secondary | ICD-10-CM | POA: Diagnosis not present

## 2019-06-20 DIAGNOSIS — M5416 Radiculopathy, lumbar region: Secondary | ICD-10-CM | POA: Diagnosis not present

## 2019-06-20 DIAGNOSIS — M25569 Pain in unspecified knee: Secondary | ICD-10-CM | POA: Diagnosis not present

## 2019-06-24 DIAGNOSIS — S83241A Other tear of medial meniscus, current injury, right knee, initial encounter: Secondary | ICD-10-CM | POA: Diagnosis not present

## 2019-06-24 DIAGNOSIS — S83271A Complex tear of lateral meniscus, current injury, right knee, initial encounter: Secondary | ICD-10-CM | POA: Diagnosis not present

## 2019-06-24 DIAGNOSIS — M25461 Effusion, right knee: Secondary | ICD-10-CM | POA: Diagnosis not present

## 2019-06-25 DIAGNOSIS — M25561 Pain in right knee: Secondary | ICD-10-CM | POA: Diagnosis not present

## 2019-06-25 DIAGNOSIS — S83281A Other tear of lateral meniscus, current injury, right knee, initial encounter: Secondary | ICD-10-CM | POA: Diagnosis not present

## 2019-06-25 DIAGNOSIS — S83241A Other tear of medial meniscus, current injury, right knee, initial encounter: Secondary | ICD-10-CM | POA: Diagnosis not present

## 2019-07-02 DIAGNOSIS — M25461 Effusion, right knee: Secondary | ICD-10-CM | POA: Diagnosis not present

## 2019-07-02 DIAGNOSIS — M25561 Pain in right knee: Secondary | ICD-10-CM | POA: Diagnosis not present

## 2019-07-08 DIAGNOSIS — M25461 Effusion, right knee: Secondary | ICD-10-CM | POA: Diagnosis not present

## 2019-07-08 DIAGNOSIS — M25561 Pain in right knee: Secondary | ICD-10-CM | POA: Diagnosis not present

## 2019-07-11 DIAGNOSIS — M25561 Pain in right knee: Secondary | ICD-10-CM | POA: Diagnosis not present

## 2019-07-11 DIAGNOSIS — M25461 Effusion, right knee: Secondary | ICD-10-CM | POA: Diagnosis not present

## 2019-07-15 DIAGNOSIS — M25461 Effusion, right knee: Secondary | ICD-10-CM | POA: Diagnosis not present

## 2019-07-15 DIAGNOSIS — M25561 Pain in right knee: Secondary | ICD-10-CM | POA: Diagnosis not present

## 2019-07-16 DIAGNOSIS — M25561 Pain in right knee: Secondary | ICD-10-CM | POA: Diagnosis not present

## 2019-07-24 DIAGNOSIS — M23221 Derangement of posterior horn of medial meniscus due to old tear or injury, right knee: Secondary | ICD-10-CM | POA: Diagnosis not present

## 2019-07-24 DIAGNOSIS — M23241 Derangement of anterior horn of lateral meniscus due to old tear or injury, right knee: Secondary | ICD-10-CM | POA: Diagnosis not present

## 2019-07-24 DIAGNOSIS — M94261 Chondromalacia, right knee: Secondary | ICD-10-CM | POA: Diagnosis not present

## 2019-07-30 DIAGNOSIS — M25461 Effusion, right knee: Secondary | ICD-10-CM | POA: Diagnosis not present

## 2019-07-30 DIAGNOSIS — M25561 Pain in right knee: Secondary | ICD-10-CM | POA: Diagnosis not present

## 2019-07-30 DIAGNOSIS — Z4789 Encounter for other orthopedic aftercare: Secondary | ICD-10-CM | POA: Diagnosis not present

## 2019-08-06 DIAGNOSIS — M25561 Pain in right knee: Secondary | ICD-10-CM | POA: Diagnosis not present

## 2019-08-06 DIAGNOSIS — M25461 Effusion, right knee: Secondary | ICD-10-CM | POA: Diagnosis not present

## 2019-08-06 DIAGNOSIS — Z4789 Encounter for other orthopedic aftercare: Secondary | ICD-10-CM | POA: Diagnosis not present

## 2019-08-12 DIAGNOSIS — M5431 Sciatica, right side: Secondary | ICD-10-CM | POA: Diagnosis not present

## 2019-08-14 DIAGNOSIS — Z4789 Encounter for other orthopedic aftercare: Secondary | ICD-10-CM | POA: Diagnosis not present

## 2019-08-14 DIAGNOSIS — M25561 Pain in right knee: Secondary | ICD-10-CM | POA: Diagnosis not present

## 2019-08-14 DIAGNOSIS — M25461 Effusion, right knee: Secondary | ICD-10-CM | POA: Diagnosis not present

## 2019-08-19 DIAGNOSIS — M5431 Sciatica, right side: Secondary | ICD-10-CM | POA: Diagnosis not present

## 2019-09-17 DIAGNOSIS — Z4789 Encounter for other orthopedic aftercare: Secondary | ICD-10-CM | POA: Diagnosis not present

## 2019-09-17 DIAGNOSIS — M25561 Pain in right knee: Secondary | ICD-10-CM | POA: Diagnosis not present

## 2019-09-17 DIAGNOSIS — M25461 Effusion, right knee: Secondary | ICD-10-CM | POA: Diagnosis not present

## 2019-09-18 DIAGNOSIS — M8580 Other specified disorders of bone density and structure, unspecified site: Secondary | ICD-10-CM | POA: Diagnosis not present

## 2019-09-18 DIAGNOSIS — I10 Essential (primary) hypertension: Secondary | ICD-10-CM | POA: Diagnosis not present

## 2019-09-18 DIAGNOSIS — I722 Aneurysm of renal artery: Secondary | ICD-10-CM | POA: Diagnosis not present

## 2019-09-27 DIAGNOSIS — K121 Other forms of stomatitis: Secondary | ICD-10-CM | POA: Diagnosis not present

## 2019-10-08 DIAGNOSIS — Z9889 Other specified postprocedural states: Secondary | ICD-10-CM | POA: Diagnosis not present

## 2019-10-11 ENCOUNTER — Ambulatory Visit: Payer: Medicare Other

## 2019-11-25 DIAGNOSIS — G43719 Chronic migraine without aura, intractable, without status migrainosus: Secondary | ICD-10-CM | POA: Diagnosis not present

## 2019-12-04 DIAGNOSIS — Z79899 Other long term (current) drug therapy: Secondary | ICD-10-CM | POA: Diagnosis not present

## 2019-12-04 DIAGNOSIS — K52832 Lymphocytic colitis: Secondary | ICD-10-CM | POA: Diagnosis not present

## 2019-12-06 DIAGNOSIS — M67912 Unspecified disorder of synovium and tendon, left shoulder: Secondary | ICD-10-CM | POA: Diagnosis not present

## 2019-12-20 DIAGNOSIS — E559 Vitamin D deficiency, unspecified: Secondary | ICD-10-CM | POA: Insufficient documentation

## 2019-12-20 DIAGNOSIS — K120 Recurrent oral aphthae: Secondary | ICD-10-CM | POA: Diagnosis not present

## 2019-12-20 DIAGNOSIS — R8290 Unspecified abnormal findings in urine: Secondary | ICD-10-CM | POA: Diagnosis not present

## 2019-12-20 DIAGNOSIS — K52832 Lymphocytic colitis: Secondary | ICD-10-CM | POA: Diagnosis not present

## 2019-12-31 DIAGNOSIS — R202 Paresthesia of skin: Secondary | ICD-10-CM | POA: Diagnosis not present

## 2019-12-31 DIAGNOSIS — K120 Recurrent oral aphthae: Secondary | ICD-10-CM | POA: Diagnosis not present

## 2020-01-11 HISTORY — PX: CATARACT EXTRACTION, BILATERAL: SHX1313

## 2020-02-03 DIAGNOSIS — M9903 Segmental and somatic dysfunction of lumbar region: Secondary | ICD-10-CM | POA: Diagnosis not present

## 2020-02-03 DIAGNOSIS — M5136 Other intervertebral disc degeneration, lumbar region: Secondary | ICD-10-CM | POA: Diagnosis not present

## 2020-02-03 DIAGNOSIS — M545 Low back pain: Secondary | ICD-10-CM | POA: Diagnosis not present

## 2020-02-04 DIAGNOSIS — H2511 Age-related nuclear cataract, right eye: Secondary | ICD-10-CM | POA: Diagnosis not present

## 2020-02-05 DIAGNOSIS — H2512 Age-related nuclear cataract, left eye: Secondary | ICD-10-CM | POA: Diagnosis not present

## 2020-02-11 DIAGNOSIS — G4700 Insomnia, unspecified: Secondary | ICD-10-CM | POA: Diagnosis not present

## 2020-02-11 DIAGNOSIS — I722 Aneurysm of renal artery: Secondary | ICD-10-CM | POA: Diagnosis not present

## 2020-02-11 DIAGNOSIS — H2512 Age-related nuclear cataract, left eye: Secondary | ICD-10-CM | POA: Diagnosis not present

## 2020-02-11 DIAGNOSIS — M8580 Other specified disorders of bone density and structure, unspecified site: Secondary | ICD-10-CM | POA: Diagnosis not present

## 2020-02-11 DIAGNOSIS — I10 Essential (primary) hypertension: Secondary | ICD-10-CM | POA: Diagnosis not present

## 2020-02-12 ENCOUNTER — Other Ambulatory Visit: Payer: Self-pay | Admitting: Obstetrics & Gynecology

## 2020-02-12 DIAGNOSIS — Z1231 Encounter for screening mammogram for malignant neoplasm of breast: Secondary | ICD-10-CM

## 2020-02-18 DIAGNOSIS — K2950 Unspecified chronic gastritis without bleeding: Secondary | ICD-10-CM | POA: Diagnosis not present

## 2020-02-18 DIAGNOSIS — K644 Residual hemorrhoidal skin tags: Secondary | ICD-10-CM | POA: Diagnosis not present

## 2020-02-18 DIAGNOSIS — K269 Duodenal ulcer, unspecified as acute or chronic, without hemorrhage or perforation: Secondary | ICD-10-CM | POA: Diagnosis not present

## 2020-02-18 DIAGNOSIS — Z8719 Personal history of other diseases of the digestive system: Secondary | ICD-10-CM | POA: Diagnosis not present

## 2020-02-18 DIAGNOSIS — Z1211 Encounter for screening for malignant neoplasm of colon: Secondary | ICD-10-CM | POA: Diagnosis not present

## 2020-02-18 DIAGNOSIS — R1013 Epigastric pain: Secondary | ICD-10-CM | POA: Diagnosis not present

## 2020-02-18 DIAGNOSIS — K52832 Lymphocytic colitis: Secondary | ICD-10-CM | POA: Diagnosis not present

## 2020-02-25 DIAGNOSIS — M5136 Other intervertebral disc degeneration, lumbar region: Secondary | ICD-10-CM | POA: Diagnosis not present

## 2020-02-25 DIAGNOSIS — M545 Low back pain: Secondary | ICD-10-CM | POA: Diagnosis not present

## 2020-02-25 DIAGNOSIS — M9903 Segmental and somatic dysfunction of lumbar region: Secondary | ICD-10-CM | POA: Diagnosis not present

## 2020-02-26 ENCOUNTER — Other Ambulatory Visit: Payer: Self-pay

## 2020-02-26 ENCOUNTER — Ambulatory Visit
Admission: RE | Admit: 2020-02-26 | Discharge: 2020-02-26 | Disposition: A | Discharge status: Home / Self Care | Payer: Medicare Other | Source: Ambulatory Visit | Attending: Obstetrics & Gynecology | Admitting: Obstetrics & Gynecology

## 2020-02-26 DIAGNOSIS — Z1231 Encounter for screening mammogram for malignant neoplasm of breast: Secondary | ICD-10-CM | POA: Diagnosis not present

## 2020-02-27 DIAGNOSIS — M9903 Segmental and somatic dysfunction of lumbar region: Secondary | ICD-10-CM | POA: Diagnosis not present

## 2020-02-27 DIAGNOSIS — M545 Low back pain: Secondary | ICD-10-CM | POA: Diagnosis not present

## 2020-02-27 DIAGNOSIS — M5136 Other intervertebral disc degeneration, lumbar region: Secondary | ICD-10-CM | POA: Diagnosis not present

## 2020-03-17 DIAGNOSIS — M9903 Segmental and somatic dysfunction of lumbar region: Secondary | ICD-10-CM | POA: Diagnosis not present

## 2020-03-17 DIAGNOSIS — M545 Low back pain: Secondary | ICD-10-CM | POA: Diagnosis not present

## 2020-03-17 DIAGNOSIS — M5136 Other intervertebral disc degeneration, lumbar region: Secondary | ICD-10-CM | POA: Diagnosis not present

## 2020-03-19 DIAGNOSIS — M5136 Other intervertebral disc degeneration, lumbar region: Secondary | ICD-10-CM | POA: Diagnosis not present

## 2020-03-19 DIAGNOSIS — M545 Low back pain: Secondary | ICD-10-CM | POA: Diagnosis not present

## 2020-03-19 DIAGNOSIS — M9903 Segmental and somatic dysfunction of lumbar region: Secondary | ICD-10-CM | POA: Diagnosis not present

## 2020-03-24 DIAGNOSIS — M545 Low back pain: Secondary | ICD-10-CM | POA: Diagnosis not present

## 2020-03-24 DIAGNOSIS — M5136 Other intervertebral disc degeneration, lumbar region: Secondary | ICD-10-CM | POA: Diagnosis not present

## 2020-03-24 DIAGNOSIS — M9903 Segmental and somatic dysfunction of lumbar region: Secondary | ICD-10-CM | POA: Diagnosis not present

## 2020-03-26 DIAGNOSIS — M9903 Segmental and somatic dysfunction of lumbar region: Secondary | ICD-10-CM | POA: Diagnosis not present

## 2020-03-26 DIAGNOSIS — M5136 Other intervertebral disc degeneration, lumbar region: Secondary | ICD-10-CM | POA: Diagnosis not present

## 2020-03-26 DIAGNOSIS — M545 Low back pain: Secondary | ICD-10-CM | POA: Diagnosis not present

## 2020-03-27 DIAGNOSIS — R202 Paresthesia of skin: Secondary | ICD-10-CM | POA: Diagnosis not present

## 2020-03-27 DIAGNOSIS — L570 Actinic keratosis: Secondary | ICD-10-CM | POA: Diagnosis not present

## 2020-03-27 DIAGNOSIS — Z79899 Other long term (current) drug therapy: Secondary | ICD-10-CM | POA: Diagnosis not present

## 2020-03-27 DIAGNOSIS — D1801 Hemangioma of skin and subcutaneous tissue: Secondary | ICD-10-CM | POA: Diagnosis not present

## 2020-03-27 DIAGNOSIS — L821 Other seborrheic keratosis: Secondary | ICD-10-CM | POA: Diagnosis not present

## 2020-03-27 DIAGNOSIS — L820 Inflamed seborrheic keratosis: Secondary | ICD-10-CM | POA: Diagnosis not present

## 2020-03-27 DIAGNOSIS — B351 Tinea unguium: Secondary | ICD-10-CM | POA: Diagnosis not present

## 2020-03-27 DIAGNOSIS — D485 Neoplasm of uncertain behavior of skin: Secondary | ICD-10-CM | POA: Diagnosis not present

## 2020-03-27 DIAGNOSIS — Z1283 Encounter for screening for malignant neoplasm of skin: Secondary | ICD-10-CM | POA: Diagnosis not present

## 2020-03-31 DIAGNOSIS — M545 Low back pain: Secondary | ICD-10-CM | POA: Diagnosis not present

## 2020-03-31 DIAGNOSIS — M5441 Lumbago with sciatica, right side: Secondary | ICD-10-CM | POA: Diagnosis not present

## 2020-03-31 DIAGNOSIS — M25551 Pain in right hip: Secondary | ICD-10-CM | POA: Diagnosis not present

## 2020-03-31 DIAGNOSIS — M25561 Pain in right knee: Secondary | ICD-10-CM | POA: Diagnosis not present

## 2020-03-31 DIAGNOSIS — M1711 Unilateral primary osteoarthritis, right knee: Secondary | ICD-10-CM | POA: Diagnosis not present

## 2020-04-10 NOTE — Progress Notes (Addendum)
74 y.o. [id number] Married White or Caucasian female here for annual exam.  Reports she's not feeling very well this morning.  Drank wine with a friend last night.  States "you can drink a lot of wine in 4 hours).    Had knee surgery in November.    Has been on and off steroids this past year due to knee issues.    Had endoscopy and colonoscopy 02/18/2020 showing no significant colitis but ulcer was present.  Was started on omeprazole.  Biopsies showed gastritis but no colitis.   Having some mouth ulcers.  Has seen [facility] rheumatologist and had blood work for evaluation.  This did not show anything.  Then saw dermatologist who recommended biopsy if symptoms worsened.  Also had cataracts remove in May.  Did well with this.  Denies vaginal bleeding.    Patient's last menstrual period was 12/14/1972.          Sexually active: Yes.    The current method of family planning is status post hysterectomy.    Exercising: Yes.    walking, ballroom dancing Smoker:  no  Health Maintenance: Pap:  2011 neg History of abnormal Pap:  no MMG:  02-27-2020 category c density birads 1:neg Colonoscopy:  2014, 02-18-2020 (In Care everywhere) BMD:   12-04-14.  She is going to plan to have this repeated this year with Dr. Rex Kras.  Last one was done at [facility]. TDaP: 2021 Pneumonia vaccine(s):  done Shingrix:   done Hep C testing: neg 2017 Screening Labs: had undergone extensive blood work this year.   reports that she quit smoking about 37 years ago. She has never used smokeless tobacco. She reports current alcohol use of about 14.0 standard drinks of alcohol per week. She reports that she does not use drugs.  Past Medical History:  Diagnosis Date  . Aneurysm of renal artery (HCC)   . Fracture of right wrist 2011   Fell in bathroom.  Marland Kitchen Hx of migraines    l  . Hypertension   . Left thyroid nodule 2003   S/P biopsy - benign  . Lymphocytic colitis 7/14  . Migraines   . Osteopenia 2011  . Visceral injury     detached due to recent fall    Past Surgical History:  Procedure Laterality Date  . APPENDECTOMY  1962  . BREAST EXCISIONAL BIOPSY Bilateral 2001  . BREAST SURGERY     S/P multiple breast biopsies.  Marland Kitchen CATARACT EXTRACTION, BILATERAL    . Dental implants  1993  . HEMORRHOID SURGERY  2000  . KNEE SURGERY Right   . SKIN BIOPSY     precancerous, lesion removed  . VAGINAL HYSTERECTOMY  1974   Dysfunctional uterine bleeding.    Current Outpatient Medications  Medication Sig Dispense Refill  . amLODipine (NORVASC) 5 MG tablet Take 5 mg by mouth daily.    . Ascorbic Acid (VITAMIN C PO) Take by mouth.    . BUDESONIDE PO Take 3 mg by mouth as needed.    Marland Kitchen buPROPion (WELLBUTRIN XL) 150 MG 24 hr tablet Take 1 tablet (150 mg total) by mouth daily. 90 tablet 4  . calcium carbonate (OS-CAL) 600 MG TABS Take 600 mg by mouth 3 (three) times daily with meals.    . diphenhydrAMINE (BENADRYL) 25 mg capsule Take 25 mg by mouth.    . fluocinonide cream (LIDEX) 0.05 % Apply to itchy skin of the upper back as needed.    . fluticasone (FLONASE) 50 MCG/ACT nasal  spray Right nostril nightly  5  . gentamicin cream (GARAMYCIN) 0.1 %     . L-CYSTEINE PO Take by mouth.    . losartan (COZAAR) 50 MG tablet Take 50 mg by mouth daily.    . magnesium oxide (MAG-OX) 400 MG tablet Take by mouth.    . Melatonin 5 MG TABS Take by mouth at bedtime.    . Multiple Vitamin (MULTIVITAMIN) tablet Take 1 tablet by mouth daily.    . Omega-3 1000 MG CAPS Take by mouth.    Marland Kitchen omeprazole (PRILOSEC) 20 MG capsule Take by mouth.    . potassium chloride (K-DUR) 10 MEQ tablet Take 10 mEq by mouth 2 (two) times daily. Dosage not indicated by patient.  States she takes occasionally.    . predniSONE (STERAPRED UNI-PAK 48 TAB) 5 MG (48) TBPK tablet Take by mouth as directed.    . SUMAtriptan (IMITREX) 50 MG tablet Take by mouth as directed.    . Thiamine HCl (B-1 PO) Take by mouth.    . tretinoin (RETIN-A) 0.1 % cream APPLY AA ON THE  SKIN UTD    . vitamin B-12 (CYANOCOBALAMIN) 1000 MCG tablet Take by mouth daily.    Marland Kitchen VITAMIN D PO Take 1,200 Int'l Units by mouth.     No current facility-administered medications for this visit.    Family History  Problem Relation Age of Onset  . Hypertension Mother   . Cancer Sister        melanoma  . Autoimmune disease Sister        crest and sjor  . Lupus Brother   . Breast cancer Daughter     Review of Systems  Constitutional: Negative.   HENT: Negative.   Eyes: Negative.   Respiratory: Negative.   Cardiovascular: Negative.   Gastrointestinal: Negative.   Endocrine: Negative.   Genitourinary: Negative.   Musculoskeletal: Negative.   Skin: Negative.   Allergic/Immunologic: Negative.   Neurological: Negative.   Hematological: Negative.   Psychiatric/Behavioral: Negative.     Exam:   Ht 5' 2.75" (1.594 m)   Wt 115 lb (52.2 kg)   LMP 12/14/1972   BMI 20.53 kg/m   Height: 5' 2.75" (159.4 cm)  General appearance: alert, cooperative and appears stated age Head: Normocephalic, without obvious abnormality, atraumatic Neck: no adenopathy, supple, symmetrical, trachea midline and thyroid with ~4cm nodule Lungs: clear to auscultation bilaterally Breasts: normal appearance, no masses or tenderness Heart: regular rate and rhythm Abdomen: soft, non-tender; bowel sounds normal; no masses,  no organomegaly Extremities: extremities normal, atraumatic, no cyanosis or edema Skin: Skin color, texture, turgor normal. No rashes or lesions Lymph nodes: Cervical, supraclavicular, and axillary nodes normal. No abnormal inguinal nodes palpated Neurologic: Grossly normal   Pelvic: External genitalia:  no lesions              Urethra:  normal appearing urethra with no masses, tenderness or lesions              Bartholins and Skenes: normal                 Vagina: normal appearing vagina with normal color and discharge, no lesions              Cervix: absent              Pap  taken: No. Bimanual Exam:  Uterus:  uterus absent              Adnexa: no mass, fullness, tenderness  Rectovaginal: Confirms               Anus:  normal sphincter tone, no lesions  Chaperone, Terence Lux, CMA, was present for exam.  A:  Well Woman with normal exam PMP, no HRT H/o TV due to DUB H/o enlarged, symmetric thyroid nodule.  Biopsied 7/12 with negative cytology.  Followed by Dr. Buddy Duty. H/o renal artery aneurysm.  Will be seen at [facility] next year. Osteopenia, plan BMD this year Migraines, every 3 months botox injections are still being done H/o lymphocytic colitis  P:   Mammogram guidelines reviewed.  Doing yearly. pap smear not indicated Colonoscopy up to date Wellbutrin 150mg  XL daily.  #90/4RF. Extensive lab work has bene done this year Vaccines reviewed Return annually or prn

## 2020-04-13 ENCOUNTER — Encounter: Payer: Self-pay | Admitting: Obstetrics & Gynecology

## 2020-04-13 ENCOUNTER — Ambulatory Visit (INDEPENDENT_AMBULATORY_CARE_PROVIDER_SITE_OTHER): Payer: Medicare Other | Admitting: Obstetrics & Gynecology

## 2020-04-13 ENCOUNTER — Other Ambulatory Visit: Payer: Self-pay

## 2020-04-13 VITALS — BP 130/84 | HR 68 | Resp 16 | Ht 62.75 in | Wt 115.0 lb

## 2020-04-13 DIAGNOSIS — Z124 Encounter for screening for malignant neoplasm of cervix: Secondary | ICD-10-CM | POA: Diagnosis not present

## 2020-04-13 DIAGNOSIS — Z01419 Encounter for gynecological examination (general) (routine) without abnormal findings: Secondary | ICD-10-CM

## 2020-04-13 MED ORDER — BUPROPION HCL ER (XL) 150 MG PO TB24: 150.0000 mg | ORAL_TABLET | Freq: Every day | ORAL | 4 refills | Status: DC

## 2020-04-19 DIAGNOSIS — Z20822 Contact with and (suspected) exposure to covid-19: Secondary | ICD-10-CM | POA: Diagnosis not present

## 2020-04-19 DIAGNOSIS — J029 Acute pharyngitis, unspecified: Secondary | ICD-10-CM | POA: Diagnosis not present

## 2020-04-20 DIAGNOSIS — J029 Acute pharyngitis, unspecified: Secondary | ICD-10-CM | POA: Diagnosis not present

## 2020-04-20 DIAGNOSIS — K120 Recurrent oral aphthae: Secondary | ICD-10-CM | POA: Diagnosis not present

## 2020-04-20 DIAGNOSIS — K5289 Other specified noninfective gastroenteritis and colitis: Secondary | ICD-10-CM | POA: Diagnosis not present

## 2020-04-24 ENCOUNTER — Ambulatory Visit: Payer: Medicare Other | Admitting: Obstetrics & Gynecology

## 2020-04-28 DIAGNOSIS — J029 Acute pharyngitis, unspecified: Secondary | ICD-10-CM | POA: Diagnosis not present

## 2020-04-28 DIAGNOSIS — K5289 Other specified noninfective gastroenteritis and colitis: Secondary | ICD-10-CM | POA: Diagnosis not present

## 2020-04-28 DIAGNOSIS — K120 Recurrent oral aphthae: Secondary | ICD-10-CM | POA: Diagnosis not present

## 2020-04-30 DIAGNOSIS — M25561 Pain in right knee: Secondary | ICD-10-CM | POA: Diagnosis not present

## 2020-05-06 DIAGNOSIS — I10 Essential (primary) hypertension: Secondary | ICD-10-CM | POA: Diagnosis not present

## 2020-05-06 DIAGNOSIS — I722 Aneurysm of renal artery: Secondary | ICD-10-CM | POA: Diagnosis not present

## 2020-05-06 DIAGNOSIS — G4700 Insomnia, unspecified: Secondary | ICD-10-CM | POA: Diagnosis not present

## 2020-05-06 DIAGNOSIS — M8580 Other specified disorders of bone density and structure, unspecified site: Secondary | ICD-10-CM | POA: Diagnosis not present

## 2020-05-07 DIAGNOSIS — E042 Nontoxic multinodular goiter: Secondary | ICD-10-CM | POA: Diagnosis not present

## 2020-05-07 DIAGNOSIS — R899 Unspecified abnormal finding in specimens from other organs, systems and tissues: Secondary | ICD-10-CM | POA: Diagnosis not present

## 2020-05-07 DIAGNOSIS — Z23 Encounter for immunization: Secondary | ICD-10-CM | POA: Diagnosis not present

## 2020-05-07 DIAGNOSIS — E538 Deficiency of other specified B group vitamins: Secondary | ICD-10-CM | POA: Diagnosis not present

## 2020-05-11 DIAGNOSIS — Z92241 Personal history of systemic steroid therapy: Secondary | ICD-10-CM | POA: Diagnosis not present

## 2020-05-11 DIAGNOSIS — R9389 Abnormal findings on diagnostic imaging of other specified body structures: Secondary | ICD-10-CM | POA: Diagnosis not present

## 2020-05-11 DIAGNOSIS — E871 Hypo-osmolality and hyponatremia: Secondary | ICD-10-CM | POA: Diagnosis not present

## 2020-05-11 DIAGNOSIS — Z8261 Family history of arthritis: Secondary | ICD-10-CM | POA: Diagnosis not present

## 2020-05-11 DIAGNOSIS — E042 Nontoxic multinodular goiter: Secondary | ICD-10-CM | POA: Diagnosis not present

## 2020-05-11 DIAGNOSIS — R634 Abnormal weight loss: Secondary | ICD-10-CM | POA: Diagnosis not present

## 2020-05-13 DIAGNOSIS — K52832 Lymphocytic colitis: Secondary | ICD-10-CM | POA: Diagnosis not present

## 2020-05-13 DIAGNOSIS — K120 Recurrent oral aphthae: Secondary | ICD-10-CM | POA: Diagnosis not present

## 2020-05-27 DIAGNOSIS — H26491 Other secondary cataract, right eye: Secondary | ICD-10-CM | POA: Diagnosis not present

## 2020-05-27 DIAGNOSIS — H26492 Other secondary cataract, left eye: Secondary | ICD-10-CM | POA: Diagnosis not present

## 2020-05-27 DIAGNOSIS — Z961 Presence of intraocular lens: Secondary | ICD-10-CM | POA: Diagnosis not present

## 2020-05-27 DIAGNOSIS — H43393 Other vitreous opacities, bilateral: Secondary | ICD-10-CM | POA: Diagnosis not present

## 2020-05-29 DIAGNOSIS — Z92241 Personal history of systemic steroid therapy: Secondary | ICD-10-CM | POA: Diagnosis not present

## 2020-05-29 DIAGNOSIS — R634 Abnormal weight loss: Secondary | ICD-10-CM | POA: Diagnosis not present

## 2020-05-29 DIAGNOSIS — E871 Hypo-osmolality and hyponatremia: Secondary | ICD-10-CM | POA: Diagnosis not present

## 2020-06-01 DIAGNOSIS — G43719 Chronic migraine without aura, intractable, without status migrainosus: Secondary | ICD-10-CM | POA: Diagnosis not present

## 2020-06-08 DIAGNOSIS — G4700 Insomnia, unspecified: Secondary | ICD-10-CM | POA: Diagnosis not present

## 2020-06-08 DIAGNOSIS — I10 Essential (primary) hypertension: Secondary | ICD-10-CM | POA: Diagnosis not present

## 2020-06-08 DIAGNOSIS — M8580 Other specified disorders of bone density and structure, unspecified site: Secondary | ICD-10-CM | POA: Diagnosis not present

## 2020-06-08 DIAGNOSIS — I722 Aneurysm of renal artery: Secondary | ICD-10-CM | POA: Diagnosis not present

## 2020-06-17 DIAGNOSIS — H26492 Other secondary cataract, left eye: Secondary | ICD-10-CM | POA: Diagnosis not present

## 2020-06-20 DIAGNOSIS — Z23 Encounter for immunization: Secondary | ICD-10-CM | POA: Diagnosis not present

## 2020-06-30 DIAGNOSIS — G4700 Insomnia, unspecified: Secondary | ICD-10-CM | POA: Diagnosis not present

## 2020-06-30 DIAGNOSIS — M5442 Lumbago with sciatica, left side: Secondary | ICD-10-CM | POA: Diagnosis not present

## 2020-06-30 DIAGNOSIS — M545 Low back pain, unspecified: Secondary | ICD-10-CM | POA: Diagnosis not present

## 2020-06-30 DIAGNOSIS — M25561 Pain in right knee: Secondary | ICD-10-CM | POA: Diagnosis not present

## 2020-06-30 DIAGNOSIS — I722 Aneurysm of renal artery: Secondary | ICD-10-CM | POA: Diagnosis not present

## 2020-06-30 DIAGNOSIS — M8580 Other specified disorders of bone density and structure, unspecified site: Secondary | ICD-10-CM | POA: Diagnosis not present

## 2020-06-30 DIAGNOSIS — I10 Essential (primary) hypertension: Secondary | ICD-10-CM | POA: Diagnosis not present

## 2020-06-30 DIAGNOSIS — M1711 Unilateral primary osteoarthritis, right knee: Secondary | ICD-10-CM | POA: Diagnosis not present

## 2020-07-01 DIAGNOSIS — K5289 Other specified noninfective gastroenteritis and colitis: Secondary | ICD-10-CM | POA: Diagnosis not present

## 2020-07-01 DIAGNOSIS — M8580 Other specified disorders of bone density and structure, unspecified site: Secondary | ICD-10-CM | POA: Diagnosis not present

## 2020-07-01 DIAGNOSIS — Z0000 Encounter for general adult medical examination without abnormal findings: Secondary | ICD-10-CM | POA: Diagnosis not present

## 2020-07-01 DIAGNOSIS — E871 Hypo-osmolality and hyponatremia: Secondary | ICD-10-CM | POA: Diagnosis not present

## 2020-07-01 DIAGNOSIS — Z86010 Personal history of colonic polyps: Secondary | ICD-10-CM | POA: Diagnosis not present

## 2020-07-01 DIAGNOSIS — I10 Essential (primary) hypertension: Secondary | ICD-10-CM | POA: Diagnosis not present

## 2020-07-01 DIAGNOSIS — Z8669 Personal history of other diseases of the nervous system and sense organs: Secondary | ICD-10-CM | POA: Diagnosis not present

## 2020-07-01 DIAGNOSIS — I722 Aneurysm of renal artery: Secondary | ICD-10-CM | POA: Diagnosis not present

## 2020-07-01 DIAGNOSIS — R899 Unspecified abnormal finding in specimens from other organs, systems and tissues: Secondary | ICD-10-CM | POA: Diagnosis not present

## 2020-07-01 DIAGNOSIS — E042 Nontoxic multinodular goiter: Secondary | ICD-10-CM | POA: Diagnosis not present

## 2020-07-06 DIAGNOSIS — M545 Low back pain, unspecified: Secondary | ICD-10-CM | POA: Diagnosis not present

## 2020-07-09 DIAGNOSIS — M545 Low back pain, unspecified: Secondary | ICD-10-CM | POA: Diagnosis not present

## 2020-07-09 DIAGNOSIS — M5442 Lumbago with sciatica, left side: Secondary | ICD-10-CM | POA: Diagnosis not present

## 2020-07-13 ENCOUNTER — Other Ambulatory Visit: Payer: Self-pay | Admitting: Family Medicine

## 2020-07-13 DIAGNOSIS — Z780 Asymptomatic menopausal state: Secondary | ICD-10-CM

## 2020-07-28 DIAGNOSIS — M5416 Radiculopathy, lumbar region: Secondary | ICD-10-CM | POA: Diagnosis not present

## 2020-08-25 DIAGNOSIS — M5416 Radiculopathy, lumbar region: Secondary | ICD-10-CM | POA: Diagnosis not present

## 2020-08-27 DIAGNOSIS — G4700 Insomnia, unspecified: Secondary | ICD-10-CM | POA: Diagnosis not present

## 2020-08-27 DIAGNOSIS — M8580 Other specified disorders of bone density and structure, unspecified site: Secondary | ICD-10-CM | POA: Diagnosis not present

## 2020-08-27 DIAGNOSIS — I10 Essential (primary) hypertension: Secondary | ICD-10-CM | POA: Diagnosis not present

## 2020-08-27 DIAGNOSIS — I722 Aneurysm of renal artery: Secondary | ICD-10-CM | POA: Diagnosis not present

## 2020-09-13 DIAGNOSIS — J069 Acute upper respiratory infection, unspecified: Secondary | ICD-10-CM | POA: Diagnosis not present

## 2020-09-13 DIAGNOSIS — Z20822 Contact with and (suspected) exposure to covid-19: Secondary | ICD-10-CM | POA: Diagnosis not present

## 2020-09-13 DIAGNOSIS — R059 Cough, unspecified: Secondary | ICD-10-CM | POA: Diagnosis not present

## 2020-09-21 DIAGNOSIS — G43719 Chronic migraine without aura, intractable, without status migrainosus: Secondary | ICD-10-CM | POA: Diagnosis not present

## 2020-09-25 DIAGNOSIS — M5416 Radiculopathy, lumbar region: Secondary | ICD-10-CM | POA: Diagnosis not present

## 2020-09-29 DIAGNOSIS — M5416 Radiculopathy, lumbar region: Secondary | ICD-10-CM | POA: Diagnosis not present

## 2020-10-05 DIAGNOSIS — I10 Essential (primary) hypertension: Secondary | ICD-10-CM | POA: Diagnosis not present

## 2020-10-05 DIAGNOSIS — I722 Aneurysm of renal artery: Secondary | ICD-10-CM | POA: Diagnosis not present

## 2020-10-05 DIAGNOSIS — Z8669 Personal history of other diseases of the nervous system and sense organs: Secondary | ICD-10-CM | POA: Diagnosis not present

## 2020-10-05 DIAGNOSIS — Z86010 Personal history of colonic polyps: Secondary | ICD-10-CM | POA: Diagnosis not present

## 2020-10-05 DIAGNOSIS — K5289 Other specified noninfective gastroenteritis and colitis: Secondary | ICD-10-CM | POA: Diagnosis not present

## 2020-10-05 DIAGNOSIS — R899 Unspecified abnormal finding in specimens from other organs, systems and tissues: Secondary | ICD-10-CM | POA: Diagnosis not present

## 2020-10-05 DIAGNOSIS — M8580 Other specified disorders of bone density and structure, unspecified site: Secondary | ICD-10-CM | POA: Diagnosis not present

## 2020-10-05 DIAGNOSIS — E042 Nontoxic multinodular goiter: Secondary | ICD-10-CM | POA: Diagnosis not present

## 2020-10-05 DIAGNOSIS — Z0000 Encounter for general adult medical examination without abnormal findings: Secondary | ICD-10-CM | POA: Diagnosis not present

## 2020-10-05 DIAGNOSIS — E871 Hypo-osmolality and hyponatremia: Secondary | ICD-10-CM | POA: Diagnosis not present

## 2020-10-07 DIAGNOSIS — M5432 Sciatica, left side: Secondary | ICD-10-CM | POA: Diagnosis not present

## 2020-10-07 DIAGNOSIS — K263 Acute duodenal ulcer without hemorrhage or perforation: Secondary | ICD-10-CM | POA: Diagnosis not present

## 2020-10-07 DIAGNOSIS — Z79899 Other long term (current) drug therapy: Secondary | ICD-10-CM | POA: Diagnosis not present

## 2020-10-07 DIAGNOSIS — K52832 Lymphocytic colitis: Secondary | ICD-10-CM | POA: Diagnosis not present

## 2020-10-07 DIAGNOSIS — K269 Duodenal ulcer, unspecified as acute or chronic, without hemorrhage or perforation: Secondary | ICD-10-CM | POA: Diagnosis not present

## 2020-10-09 DIAGNOSIS — M47816 Spondylosis without myelopathy or radiculopathy, lumbar region: Secondary | ICD-10-CM | POA: Diagnosis not present

## 2020-10-09 DIAGNOSIS — M5136 Other intervertebral disc degeneration, lumbar region: Secondary | ICD-10-CM | POA: Diagnosis not present

## 2020-10-09 DIAGNOSIS — M5416 Radiculopathy, lumbar region: Secondary | ICD-10-CM | POA: Diagnosis not present

## 2020-10-14 ENCOUNTER — Other Ambulatory Visit: Payer: Self-pay | Admitting: Family Medicine

## 2020-10-14 DIAGNOSIS — M8580 Other specified disorders of bone density and structure, unspecified site: Secondary | ICD-10-CM

## 2020-10-14 DIAGNOSIS — M5416 Radiculopathy, lumbar region: Secondary | ICD-10-CM | POA: Diagnosis not present

## 2020-10-16 ENCOUNTER — Ambulatory Visit
Admission: RE | Admit: 2020-10-16 | Discharge: 2020-10-16 | Disposition: A | Discharge status: Home / Self Care | Payer: Medicare Other | Source: Ambulatory Visit | Attending: Family Medicine | Admitting: Family Medicine

## 2020-10-16 ENCOUNTER — Other Ambulatory Visit: Payer: Self-pay

## 2020-10-16 DIAGNOSIS — M8580 Other specified disorders of bone density and structure, unspecified site: Secondary | ICD-10-CM

## 2020-10-16 DIAGNOSIS — Z780 Asymptomatic menopausal state: Secondary | ICD-10-CM | POA: Diagnosis not present

## 2020-10-16 DIAGNOSIS — M810 Age-related osteoporosis without current pathological fracture: Secondary | ICD-10-CM | POA: Diagnosis not present

## 2020-10-20 ENCOUNTER — Telehealth: Payer: Self-pay | Admitting: Obstetrics & Gynecology

## 2020-10-20 ENCOUNTER — Encounter (HOSPITAL_BASED_OUTPATIENT_CLINIC_OR_DEPARTMENT_OTHER): Payer: Self-pay

## 2020-10-22 ENCOUNTER — Other Ambulatory Visit (HOSPITAL_COMMUNITY): Payer: Self-pay | Admitting: Family Medicine

## 2020-10-22 DIAGNOSIS — M8580 Other specified disorders of bone density and structure, unspecified site: Secondary | ICD-10-CM

## 2020-10-26 DIAGNOSIS — M810 Age-related osteoporosis without current pathological fracture: Secondary | ICD-10-CM | POA: Diagnosis not present

## 2020-10-26 DIAGNOSIS — I10 Essential (primary) hypertension: Secondary | ICD-10-CM | POA: Diagnosis not present

## 2020-10-26 DIAGNOSIS — E042 Nontoxic multinodular goiter: Secondary | ICD-10-CM | POA: Diagnosis not present

## 2020-10-26 DIAGNOSIS — M47816 Spondylosis without myelopathy or radiculopathy, lumbar region: Secondary | ICD-10-CM | POA: Diagnosis not present

## 2020-10-26 DIAGNOSIS — K5289 Other specified noninfective gastroenteritis and colitis: Secondary | ICD-10-CM | POA: Diagnosis not present

## 2020-10-26 DIAGNOSIS — Z789 Other specified health status: Secondary | ICD-10-CM | POA: Diagnosis not present

## 2020-10-26 DIAGNOSIS — Z8669 Personal history of other diseases of the nervous system and sense organs: Secondary | ICD-10-CM | POA: Diagnosis not present

## 2020-10-29 DIAGNOSIS — M810 Age-related osteoporosis without current pathological fracture: Secondary | ICD-10-CM | POA: Diagnosis not present

## 2020-10-29 DIAGNOSIS — G4700 Insomnia, unspecified: Secondary | ICD-10-CM | POA: Diagnosis not present

## 2020-10-29 DIAGNOSIS — M47816 Spondylosis without myelopathy or radiculopathy, lumbar region: Secondary | ICD-10-CM | POA: Diagnosis not present

## 2020-10-29 DIAGNOSIS — I10 Essential (primary) hypertension: Secondary | ICD-10-CM | POA: Diagnosis not present

## 2020-10-29 DIAGNOSIS — I722 Aneurysm of renal artery: Secondary | ICD-10-CM | POA: Diagnosis not present

## 2020-10-29 DIAGNOSIS — M8580 Other specified disorders of bone density and structure, unspecified site: Secondary | ICD-10-CM | POA: Diagnosis not present

## 2020-10-29 NOTE — Telephone Encounter (Signed)
Called patient and schedule the appointment.

## 2020-11-02 DIAGNOSIS — M810 Age-related osteoporosis without current pathological fracture: Secondary | ICD-10-CM | POA: Diagnosis not present

## 2020-11-02 DIAGNOSIS — K5289 Other specified noninfective gastroenteritis and colitis: Secondary | ICD-10-CM | POA: Diagnosis not present

## 2020-11-02 DIAGNOSIS — Z789 Other specified health status: Secondary | ICD-10-CM | POA: Diagnosis not present

## 2020-11-02 DIAGNOSIS — I10 Essential (primary) hypertension: Secondary | ICD-10-CM | POA: Diagnosis not present

## 2020-11-02 DIAGNOSIS — Z8669 Personal history of other diseases of the nervous system and sense organs: Secondary | ICD-10-CM | POA: Diagnosis not present

## 2020-11-02 DIAGNOSIS — M47816 Spondylosis without myelopathy or radiculopathy, lumbar region: Secondary | ICD-10-CM | POA: Diagnosis not present

## 2020-11-02 DIAGNOSIS — E042 Nontoxic multinodular goiter: Secondary | ICD-10-CM | POA: Diagnosis not present

## 2020-11-04 ENCOUNTER — Other Ambulatory Visit: Payer: Self-pay

## 2020-11-04 ENCOUNTER — Encounter (HOSPITAL_BASED_OUTPATIENT_CLINIC_OR_DEPARTMENT_OTHER): Payer: Self-pay

## 2020-11-04 ENCOUNTER — Encounter (HOSPITAL_BASED_OUTPATIENT_CLINIC_OR_DEPARTMENT_OTHER): Payer: Self-pay | Admitting: Obstetrics & Gynecology

## 2020-11-04 ENCOUNTER — Ambulatory Visit (INDEPENDENT_AMBULATORY_CARE_PROVIDER_SITE_OTHER): Payer: Medicare Other | Admitting: Obstetrics & Gynecology

## 2020-11-04 VITALS — BP 168/82 | HR 70 | Ht 63.0 in | Wt 116.2 lb

## 2020-11-04 DIAGNOSIS — K52832 Lymphocytic colitis: Secondary | ICD-10-CM | POA: Diagnosis not present

## 2020-11-04 DIAGNOSIS — K5289 Other specified noninfective gastroenteritis and colitis: Secondary | ICD-10-CM | POA: Insufficient documentation

## 2020-11-04 DIAGNOSIS — Z8669 Personal history of other diseases of the nervous system and sense organs: Secondary | ICD-10-CM | POA: Insufficient documentation

## 2020-11-04 DIAGNOSIS — M5430 Sciatica, unspecified side: Secondary | ICD-10-CM

## 2020-11-04 DIAGNOSIS — M810 Age-related osteoporosis without current pathological fracture: Secondary | ICD-10-CM | POA: Diagnosis not present

## 2020-11-04 NOTE — Progress Notes (Signed)
Subjective:    89 yrs Married Caucasian G3P1012  female here to discuss recent BMD obtained 10/16/2020 showing osteoporosis with T score -2.7 in spine and -2.5 in left femoral neck.  Prior BMD was in 2013 and she had osteopenia at that time with t score -2.1 in left femoral neck.  Pt does competitive ball room dancing and wants treatment.  She does not want to fracture any bones.    She did have blood work recently with normal Vit D, TSH, metabolic panel with normal renal and hepatic function tests.  Separately, she is having sciatica issues.  Has seen Dr. Rex Kras, PCP, and was started on oral steroids.  This didn't help and she was referred to ortho and saw Dr. Jacelyn Grip.  She's had three steroid injections in her lower back that really did not give much relief.  He has not recommended surgery.  She really does not want surgery and went to have a second opinion with Dr. Boyd Kerbs at [facility].  Notes reviewed.    Osteoporosis Risk Factors  Nonmodifiable Personal Hx of fracture as an adult: yes, fractured right wrist when fell in ballroom Hx of fracture in first-degree relative: yes - mother Caucasian race: yes   Potentially modifiable: Tobacco use: no Low body weight (<127 lbs): yes Estrogen deficiency with either early menopause (age <45) or bilateral ovariectomy: no Low calcium intake (lifelong): no Alcohol use more than 2 drinks per day: at times Recurrent falls: no Inadequate physical activity: no  Current calcium and Vit D intake: 600mg  with Vit D, three times daily  Past Medical History:  Diagnosis Date  . Aneurysm of renal artery (HCC)   . Fracture of right wrist 2011   Fell in bathroom.  Marland Kitchen Hypertension   . Left thyroid nodule 2003   S/P biopsy - benign  . Lymphocytic colitis 7/14  . Migraines   . Osteoporosis   . Sciatica   . Visceral injury    detached due to recent fall   Current Outpatient Medications on File Prior to Visit  Medication Sig Dispense Refill  . amLODipine (NORVASC) 5  MG tablet Take 5 mg by mouth daily.    . Ascorbic Acid (VITAMIN C PO) Take by mouth.    . BUDESONIDE PO Take 3 mg by mouth as needed.    Marland Kitchen buPROPion (WELLBUTRIN XL) 150 MG 24 hr tablet Take 1 tablet (150 mg total) by mouth daily. 90 tablet 4  . calcium carbonate (OS-CAL) 600 MG TABS Take 600 mg by mouth 3 (three) times daily with meals.    . fluticasone (FLONASE) 50 MCG/ACT nasal spray Right nostril nightly  5  . gabapentin (NEURONTIN) 300 MG capsule Take 300 mg by mouth 2 (two) times daily.    . L-CYSTEINE PO Take by mouth.    . losartan (COZAAR) 50 MG tablet Take 50 mg by mouth daily.    . magnesium oxide (MAG-OX) 400 MG tablet Take by mouth.    . Melatonin 5 MG TABS Take by mouth at bedtime.    . Multiple Vitamin (MULTIVITAMIN) tablet Take 1 tablet by mouth daily.    . Omega-3 1000 MG CAPS Take by mouth.    Marland Kitchen omeprazole (PRILOSEC) 20 MG capsule Take by mouth.    . potassium chloride (K-DUR) 10 MEQ tablet Take 10 mEq by mouth 2 (two) times daily. Dosage not indicated by patient.  States she takes occasionally.    . SUMAtriptan (IMITREX) 50 MG tablet Take by mouth as directed.    Marland Kitchen  Thiamine HCl (B-1 PO) Take by mouth.    . vitamin B-12 (CYANOCOBALAMIN) 1000 MCG tablet Take by mouth daily.    Marland Kitchen VITAMIN D PO Take 1,200 Int'l Units by mouth.    . diphenhydrAMINE (BENADRYL) 25 mg capsule Take 25 mg by mouth. (Patient not taking: Reported on 11/04/2020)    . fluocinonide cream (LIDEX) 0.05 % Apply to itchy skin of the upper back as needed. (Patient not taking: Reported on 11/04/2020)    . gentamicin cream (GARAMYCIN) 0.1 %  (Patient not taking: Reported on 11/04/2020)    . tretinoin (RETIN-A) 0.1 % cream APPLY AA ON THE SKIN UTD (Patient not taking: Reported on 11/04/2020)     No current facility-administered medications on file prior to visit.   Allergies  Allergen Reactions  . Caine-1 [Lidocaine]     Allergic to Septocaine Articaine HCL 4% w/epi  . Indomethacin Swelling  .  Articaine-Epinephrine Other (See Comments)  . Hydrocodone Nausea Only  . Hydrocodone-Acetaminophen Other (See Comments)  . Latex     Possible allergy.  . Lamisil [Terbinafine] Rash    Review of Systems Pertinent items noted in HPI and remainder of comprehensive ROS otherwise negative.     Objective:   PHYSICAL EXAM BP (!) 168/82   Pulse 70   Ht 5\' 3"  (1.6 m)   Wt 116 lb 3.2 oz (52.7 kg)   LMP 12/14/1972   BMI 20.58 kg/m  General appearance: alert and no distress  Imaging Bone Density: Spine T Score: -2.7, Hip T Score: -2.5 (left), obtained 10/16/2020  FRAX score:  Not calculated                                       Assessment:   Osteoporosis with T score -2.7   Plan:   1. We discussed all treatment options including risks, side effects and benefits.  We discussed both oral and IV bisphosphonates, Evista, Prolia, Forteo and Calcitonin nasal spray.  Due to age and clotting risk, I do not feel comfortable with Evista at this time. Pt aware Calcitonin does not need fracture risks.  She has been on bisphosphonates (Fosamax) in the past.  She is most interested in Prolia.  Osteonecrosis discussed.  Pt is on budesonide for microscopic colitis so this risk may be a little higher with her.  She also understands that she will need to transition to oral bisphosphonate when there is improvement in BMD  2.  Continue calcium and Vit D  3.  After starting Prolia and with BMD improvement, she is aware she will need to transition to oral bisphosphonate therapy to help maintain improvement.    4. Pt will continue her regular exercise and activity.   32 minutes of total time was spent for this patient encounter, including preparation, face-to-face counseling with the patient and coordination of care, and documentation of the encounter.

## 2020-11-06 DIAGNOSIS — M47816 Spondylosis without myelopathy or radiculopathy, lumbar region: Secondary | ICD-10-CM | POA: Diagnosis not present

## 2020-11-06 DIAGNOSIS — M5136 Other intervertebral disc degeneration, lumbar region: Secondary | ICD-10-CM | POA: Diagnosis not present

## 2020-11-06 DIAGNOSIS — M48061 Spinal stenosis, lumbar region without neurogenic claudication: Secondary | ICD-10-CM | POA: Diagnosis not present

## 2020-11-12 ENCOUNTER — Encounter (HOSPITAL_BASED_OUTPATIENT_CLINIC_OR_DEPARTMENT_OTHER): Payer: Self-pay

## 2020-11-19 ENCOUNTER — Other Ambulatory Visit: Payer: Self-pay

## 2020-11-19 ENCOUNTER — Encounter (HOSPITAL_BASED_OUTPATIENT_CLINIC_OR_DEPARTMENT_OTHER): Payer: Self-pay | Admitting: Obstetrics & Gynecology

## 2020-11-19 ENCOUNTER — Ambulatory Visit (INDEPENDENT_AMBULATORY_CARE_PROVIDER_SITE_OTHER): Payer: Medicare Other | Admitting: Obstetrics & Gynecology

## 2020-11-19 VITALS — BP 148/84 | HR 73 | Ht 62.5 in | Wt 115.0 lb

## 2020-11-19 DIAGNOSIS — M810 Age-related osteoporosis without current pathological fracture: Secondary | ICD-10-CM | POA: Diagnosis not present

## 2020-11-19 MED ORDER — DENOSUMAB 60 MG/ML ~~LOC~~ SOSY
60.0000 mg | PREFILLED_SYRINGE | Freq: Once | SUBCUTANEOUS | Status: AC
  Administered 2020-11-19: 60 mg via SUBCUTANEOUS

## 2020-11-19 NOTE — Progress Notes (Signed)
Elaine Navarro is a 75 y.o. female here for a prolia inj for osteoporosis.  Had normal calcium 10/2020. Results are in [place] message sent by pt. Risks benefits reviewed.

## 2020-11-23 DIAGNOSIS — M47816 Spondylosis without myelopathy or radiculopathy, lumbar region: Secondary | ICD-10-CM | POA: Diagnosis not present

## 2020-11-24 DIAGNOSIS — M818 Other osteoporosis without current pathological fracture: Secondary | ICD-10-CM | POA: Diagnosis not present

## 2020-12-02 DIAGNOSIS — K52832 Lymphocytic colitis: Secondary | ICD-10-CM | POA: Diagnosis not present

## 2020-12-07 DIAGNOSIS — M47816 Spondylosis without myelopathy or radiculopathy, lumbar region: Secondary | ICD-10-CM | POA: Diagnosis not present

## 2020-12-15 DIAGNOSIS — Z23 Encounter for immunization: Secondary | ICD-10-CM | POA: Diagnosis not present

## 2020-12-17 DIAGNOSIS — I722 Aneurysm of renal artery: Secondary | ICD-10-CM | POA: Diagnosis not present

## 2020-12-17 DIAGNOSIS — M8580 Other specified disorders of bone density and structure, unspecified site: Secondary | ICD-10-CM | POA: Diagnosis not present

## 2020-12-17 DIAGNOSIS — M810 Age-related osteoporosis without current pathological fracture: Secondary | ICD-10-CM | POA: Diagnosis not present

## 2020-12-17 DIAGNOSIS — M47816 Spondylosis without myelopathy or radiculopathy, lumbar region: Secondary | ICD-10-CM | POA: Diagnosis not present

## 2020-12-17 DIAGNOSIS — G4700 Insomnia, unspecified: Secondary | ICD-10-CM | POA: Diagnosis not present

## 2020-12-17 DIAGNOSIS — I10 Essential (primary) hypertension: Secondary | ICD-10-CM | POA: Diagnosis not present

## 2020-12-21 DIAGNOSIS — G43719 Chronic migraine without aura, intractable, without status migrainosus: Secondary | ICD-10-CM | POA: Diagnosis not present

## 2020-12-21 DIAGNOSIS — M47816 Spondylosis without myelopathy or radiculopathy, lumbar region: Secondary | ICD-10-CM | POA: Diagnosis not present

## 2021-01-14 ENCOUNTER — Other Ambulatory Visit: Payer: Self-pay | Admitting: Obstetrics & Gynecology

## 2021-01-14 DIAGNOSIS — M25561 Pain in right knee: Secondary | ICD-10-CM | POA: Diagnosis not present

## 2021-01-14 DIAGNOSIS — Z1231 Encounter for screening mammogram for malignant neoplasm of breast: Secondary | ICD-10-CM

## 2021-01-20 DIAGNOSIS — M47816 Spondylosis without myelopathy or radiculopathy, lumbar region: Secondary | ICD-10-CM | POA: Diagnosis not present

## 2021-01-21 DIAGNOSIS — M818 Other osteoporosis without current pathological fracture: Secondary | ICD-10-CM | POA: Diagnosis not present

## 2021-01-21 DIAGNOSIS — G4700 Insomnia, unspecified: Secondary | ICD-10-CM | POA: Diagnosis not present

## 2021-01-21 DIAGNOSIS — M47816 Spondylosis without myelopathy or radiculopathy, lumbar region: Secondary | ICD-10-CM | POA: Diagnosis not present

## 2021-01-21 DIAGNOSIS — E871 Hypo-osmolality and hyponatremia: Secondary | ICD-10-CM | POA: Diagnosis not present

## 2021-01-21 DIAGNOSIS — M810 Age-related osteoporosis without current pathological fracture: Secondary | ICD-10-CM | POA: Diagnosis not present

## 2021-01-21 DIAGNOSIS — I10 Essential (primary) hypertension: Secondary | ICD-10-CM | POA: Diagnosis not present

## 2021-01-21 DIAGNOSIS — M8580 Other specified disorders of bone density and structure, unspecified site: Secondary | ICD-10-CM | POA: Diagnosis not present

## 2021-01-21 DIAGNOSIS — I722 Aneurysm of renal artery: Secondary | ICD-10-CM | POA: Diagnosis not present

## 2021-01-22 DIAGNOSIS — E871 Hypo-osmolality and hyponatremia: Secondary | ICD-10-CM | POA: Diagnosis not present

## 2021-01-22 DIAGNOSIS — M8580 Other specified disorders of bone density and structure, unspecified site: Secondary | ICD-10-CM | POA: Diagnosis not present

## 2021-01-22 DIAGNOSIS — I722 Aneurysm of renal artery: Secondary | ICD-10-CM | POA: Diagnosis not present

## 2021-01-22 DIAGNOSIS — G4700 Insomnia, unspecified: Secondary | ICD-10-CM | POA: Diagnosis not present

## 2021-01-22 DIAGNOSIS — M47816 Spondylosis without myelopathy or radiculopathy, lumbar region: Secondary | ICD-10-CM | POA: Diagnosis not present

## 2021-01-22 DIAGNOSIS — I10 Essential (primary) hypertension: Secondary | ICD-10-CM | POA: Diagnosis not present

## 2021-01-22 DIAGNOSIS — M818 Other osteoporosis without current pathological fracture: Secondary | ICD-10-CM | POA: Diagnosis not present

## 2021-01-22 DIAGNOSIS — M810 Age-related osteoporosis without current pathological fracture: Secondary | ICD-10-CM | POA: Diagnosis not present

## 2021-01-26 DIAGNOSIS — Z201 Contact with and (suspected) exposure to tuberculosis: Secondary | ICD-10-CM | POA: Diagnosis not present

## 2021-01-26 DIAGNOSIS — Z87891 Personal history of nicotine dependence: Secondary | ICD-10-CM | POA: Diagnosis not present

## 2021-01-26 DIAGNOSIS — E871 Hypo-osmolality and hyponatremia: Secondary | ICD-10-CM | POA: Diagnosis not present

## 2021-02-04 DIAGNOSIS — E871 Hypo-osmolality and hyponatremia: Secondary | ICD-10-CM | POA: Diagnosis not present

## 2021-02-15 DIAGNOSIS — M47816 Spondylosis without myelopathy or radiculopathy, lumbar region: Secondary | ICD-10-CM | POA: Diagnosis not present

## 2021-02-15 DIAGNOSIS — M818 Other osteoporosis without current pathological fracture: Secondary | ICD-10-CM | POA: Diagnosis not present

## 2021-02-15 DIAGNOSIS — G4700 Insomnia, unspecified: Secondary | ICD-10-CM | POA: Diagnosis not present

## 2021-02-15 DIAGNOSIS — I722 Aneurysm of renal artery: Secondary | ICD-10-CM | POA: Diagnosis not present

## 2021-02-15 DIAGNOSIS — I10 Essential (primary) hypertension: Secondary | ICD-10-CM | POA: Diagnosis not present

## 2021-02-15 DIAGNOSIS — M810 Age-related osteoporosis without current pathological fracture: Secondary | ICD-10-CM | POA: Diagnosis not present

## 2021-02-15 DIAGNOSIS — M8580 Other specified disorders of bone density and structure, unspecified site: Secondary | ICD-10-CM | POA: Diagnosis not present

## 2021-02-19 DIAGNOSIS — I722 Aneurysm of renal artery: Secondary | ICD-10-CM | POA: Diagnosis not present

## 2021-02-19 DIAGNOSIS — M7918 Myalgia, other site: Secondary | ICD-10-CM | POA: Diagnosis not present

## 2021-02-19 DIAGNOSIS — M47816 Spondylosis without myelopathy or radiculopathy, lumbar region: Secondary | ICD-10-CM | POA: Diagnosis not present

## 2021-02-19 DIAGNOSIS — M810 Age-related osteoporosis without current pathological fracture: Secondary | ICD-10-CM | POA: Diagnosis not present

## 2021-02-19 DIAGNOSIS — I10 Essential (primary) hypertension: Secondary | ICD-10-CM | POA: Diagnosis not present

## 2021-02-19 DIAGNOSIS — G8929 Other chronic pain: Secondary | ICD-10-CM | POA: Diagnosis not present

## 2021-02-19 DIAGNOSIS — G4700 Insomnia, unspecified: Secondary | ICD-10-CM | POA: Diagnosis not present

## 2021-02-19 DIAGNOSIS — M48061 Spinal stenosis, lumbar region without neurogenic claudication: Secondary | ICD-10-CM | POA: Diagnosis not present

## 2021-02-19 DIAGNOSIS — M5136 Other intervertebral disc degeneration, lumbar region: Secondary | ICD-10-CM | POA: Diagnosis not present

## 2021-02-19 DIAGNOSIS — E871 Hypo-osmolality and hyponatremia: Secondary | ICD-10-CM | POA: Diagnosis not present

## 2021-02-22 ENCOUNTER — Ambulatory Visit (INDEPENDENT_AMBULATORY_CARE_PROVIDER_SITE_OTHER): Payer: Medicare Other | Admitting: Podiatry

## 2021-02-22 ENCOUNTER — Other Ambulatory Visit: Payer: Self-pay

## 2021-02-22 DIAGNOSIS — M67471 Ganglion, right ankle and foot: Secondary | ICD-10-CM

## 2021-02-22 NOTE — Progress Notes (Signed)
   HPI: 75 y.o. female presenting today for new complaint regarding a bump that developed to the patient's right great toe joint over the past month.  She says it changes in size.  It can become very symptomatic with close toed shoe gear.  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  She presents for further treatment and evaluation.  She denies a history of trauma or injury.  Past Medical History:  Diagnosis Date   Aneurysm of renal artery ([place])    Fracture of right wrist 2011   Fell in bathroom.   Hypertension    Left thyroid nodule 2003   S/P biopsy - benign   Lymphocytic colitis 7/14   Migraines    Osteoporosis    Sciatica    Visceral injury    detached due to recent fall     Physical Exam: General: The patient is alert and oriented x3 in no acute distress.  Dermatology: Skin is warm, dry and supple bilateral lower extremities. Negative for open lesions or macerations.  Vascular: Palpable pedal pulses bilaterally. No edema or erythema noted. Capillary refill within normal limits.  Neurological: Epicritic and protective threshold grossly intact bilaterally.   Musculoskeletal Exam: Range of motion within normal limits to all pedal and ankle joints bilateral. Muscle strength 5/5 in all groups bilateral. Fluctuant, non-adhered mass noted to the first MTPJ of the right foot.   Assessment: 1.  Ganglion cyst first MTPJ right foot   Plan of Care:  1. Patient evaluated.  2.  Today we discussed treatment options for the patient.  He would likely benefit the patient most to go ahead and drain the lesion.  3 mL of 2% lidocaine was utilized and a superficial V-block fashion and an 18-gauge needle was utilized to aspirate the ganglion cyst and drain it.  Additional pressure was applied and the fluid was drained approximately 1 mL 3.  Light dressing applied. 4.  Explained to the patient that these do have a high likelihood of recurrence.  Recommend daily  massage and compression to the area 5.  Return to clinic as needed      Edrick Kins, DPM [facility]  Dr. Edrick Kins, DPM    [address], [place] [address]                Office [phone]  Fax [phone]

## 2021-03-04 DIAGNOSIS — G8929 Other chronic pain: Secondary | ICD-10-CM | POA: Insufficient documentation

## 2021-03-08 DIAGNOSIS — G8929 Other chronic pain: Secondary | ICD-10-CM | POA: Diagnosis not present

## 2021-03-08 DIAGNOSIS — M533 Sacrococcygeal disorders, not elsewhere classified: Secondary | ICD-10-CM | POA: Diagnosis not present

## 2021-03-10 ENCOUNTER — Other Ambulatory Visit: Payer: Self-pay

## 2021-03-10 ENCOUNTER — Ambulatory Visit
Admission: RE | Admit: 2021-03-10 | Discharge: 2021-03-10 | Disposition: A | Discharge status: Home / Self Care | Payer: Medicare Other | Source: Ambulatory Visit | Attending: Obstetrics & Gynecology | Admitting: Obstetrics & Gynecology

## 2021-03-10 DIAGNOSIS — Z1231 Encounter for screening mammogram for malignant neoplasm of breast: Secondary | ICD-10-CM

## 2021-03-16 DIAGNOSIS — R059 Cough, unspecified: Secondary | ICD-10-CM | POA: Diagnosis not present

## 2021-03-16 DIAGNOSIS — U071 COVID-19: Secondary | ICD-10-CM | POA: Diagnosis not present

## 2021-03-22 DIAGNOSIS — G43719 Chronic migraine without aura, intractable, without status migrainosus: Secondary | ICD-10-CM | POA: Diagnosis not present

## 2021-03-24 DIAGNOSIS — M1711 Unilateral primary osteoarthritis, right knee: Secondary | ICD-10-CM | POA: Diagnosis not present

## 2021-03-24 DIAGNOSIS — M25561 Pain in right knee: Secondary | ICD-10-CM | POA: Diagnosis not present

## 2021-03-24 DIAGNOSIS — G8929 Other chronic pain: Secondary | ICD-10-CM | POA: Diagnosis not present

## 2021-04-02 DIAGNOSIS — Z79899 Other long term (current) drug therapy: Secondary | ICD-10-CM | POA: Diagnosis not present

## 2021-04-02 DIAGNOSIS — Z1283 Encounter for screening for malignant neoplasm of skin: Secondary | ICD-10-CM | POA: Diagnosis not present

## 2021-04-02 DIAGNOSIS — D1801 Hemangioma of skin and subcutaneous tissue: Secondary | ICD-10-CM | POA: Diagnosis not present

## 2021-04-02 DIAGNOSIS — L821 Other seborrheic keratosis: Secondary | ICD-10-CM | POA: Diagnosis not present

## 2021-04-02 DIAGNOSIS — D229 Melanocytic nevi, unspecified: Secondary | ICD-10-CM | POA: Diagnosis not present

## 2021-04-02 DIAGNOSIS — L820 Inflamed seborrheic keratosis: Secondary | ICD-10-CM | POA: Diagnosis not present

## 2021-04-02 DIAGNOSIS — L570 Actinic keratosis: Secondary | ICD-10-CM | POA: Diagnosis not present

## 2021-04-02 DIAGNOSIS — B351 Tinea unguium: Secondary | ICD-10-CM | POA: Diagnosis not present

## 2021-04-02 DIAGNOSIS — R202 Paresthesia of skin: Secondary | ICD-10-CM | POA: Diagnosis not present

## 2021-04-06 DIAGNOSIS — M25561 Pain in right knee: Secondary | ICD-10-CM | POA: Diagnosis not present

## 2021-04-06 DIAGNOSIS — M7918 Myalgia, other site: Secondary | ICD-10-CM | POA: Diagnosis not present

## 2021-04-06 DIAGNOSIS — G5702 Lesion of sciatic nerve, left lower limb: Secondary | ICD-10-CM | POA: Diagnosis not present

## 2021-04-06 DIAGNOSIS — M47816 Spondylosis without myelopathy or radiculopathy, lumbar region: Secondary | ICD-10-CM | POA: Diagnosis not present

## 2021-04-06 DIAGNOSIS — G8929 Other chronic pain: Secondary | ICD-10-CM | POA: Diagnosis not present

## 2021-04-07 DIAGNOSIS — G5702 Lesion of sciatic nerve, left lower limb: Secondary | ICD-10-CM | POA: Insufficient documentation

## 2021-04-13 DIAGNOSIS — M1711 Unilateral primary osteoarthritis, right knee: Secondary | ICD-10-CM | POA: Diagnosis not present

## 2021-04-13 DIAGNOSIS — M25561 Pain in right knee: Secondary | ICD-10-CM | POA: Diagnosis not present

## 2021-04-13 DIAGNOSIS — G8929 Other chronic pain: Secondary | ICD-10-CM | POA: Diagnosis not present

## 2021-04-15 ENCOUNTER — Ambulatory Visit (HOSPITAL_BASED_OUTPATIENT_CLINIC_OR_DEPARTMENT_OTHER): Payer: Medicare Other | Admitting: Obstetrics & Gynecology

## 2021-04-23 DIAGNOSIS — G5702 Lesion of sciatic nerve, left lower limb: Secondary | ICD-10-CM | POA: Diagnosis not present

## 2021-05-10 ENCOUNTER — Ambulatory Visit (INDEPENDENT_AMBULATORY_CARE_PROVIDER_SITE_OTHER): Payer: Medicare Other | Admitting: Obstetrics & Gynecology

## 2021-05-10 ENCOUNTER — Encounter (HOSPITAL_BASED_OUTPATIENT_CLINIC_OR_DEPARTMENT_OTHER): Payer: Self-pay | Admitting: Obstetrics & Gynecology

## 2021-05-10 ENCOUNTER — Other Ambulatory Visit: Payer: Self-pay

## 2021-05-10 VITALS — BP 158/79 | HR 75 | Ht 62.5 in | Wt 110.4 lb

## 2021-05-10 DIAGNOSIS — Z8659 Personal history of other mental and behavioral disorders: Secondary | ICD-10-CM | POA: Diagnosis not present

## 2021-05-10 DIAGNOSIS — K52832 Lymphocytic colitis: Secondary | ICD-10-CM

## 2021-05-10 DIAGNOSIS — Z90710 Acquired absence of both cervix and uterus: Secondary | ICD-10-CM | POA: Diagnosis not present

## 2021-05-10 DIAGNOSIS — I10 Essential (primary) hypertension: Secondary | ICD-10-CM | POA: Diagnosis not present

## 2021-05-10 DIAGNOSIS — Z780 Asymptomatic menopausal state: Secondary | ICD-10-CM | POA: Diagnosis not present

## 2021-05-10 DIAGNOSIS — M810 Age-related osteoporosis without current pathological fracture: Secondary | ICD-10-CM | POA: Diagnosis not present

## 2021-05-10 DIAGNOSIS — E041 Nontoxic single thyroid nodule: Secondary | ICD-10-CM

## 2021-05-10 DIAGNOSIS — Z01419 Encounter for gynecological examination (general) (routine) without abnormal findings: Secondary | ICD-10-CM

## 2021-05-10 MED ORDER — BUPROPION HCL ER (XL) 150 MG PO TB24: 150.0000 mg | ORAL_TABLET | Freq: Every day | ORAL | 4 refills | Status: DC

## 2021-05-10 NOTE — Progress Notes (Signed)
75 y.o. [id number] Married White or Caucasian female here for breast and pelvic exam.  I am following her for osteoporosis.  She is on Prolia and will have second injection 05/21/2021.  Did well with first injection.    Denies vaginal bleeding.  Had some back, sciatica issues this year.  Was on gabapentin and has been receiving injections.  Has follow up on Wednesday.  Pt is aware BP is elevated today.  Checks at home.  Is on amlodipine and losartan.  On Wellbutrin XL.  Feels good.  Does need refill.  Still ballroom dancing.  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  Patient's last menstrual period was 12/14/1972.          Sexually active: Yes.    H/O STD:  no  Health Maintenance: PCP:  Dr. Jacelyn Grip.  Last wellness appt was earlier this year.  Did blood work at that appt:  yes.  Sodium level has been low.  This has been followed.  I do not have copies of these labs.  Has also seen Dr. Buddy Duty, endocrinology.  Vaccines are up to date:  reviewed with pt Colonoscopy:  03/19/13 MMG:  03/10/21 neg BMD:  10/16/20 osteoporosis Last pap smear:  hysterectomy H/o abnormal pap smear:  no hx   reports that she quit smoking about 38 years ago. Her smoking use included cigarettes. She has never used smokeless tobacco. She reports current alcohol use of about 14.0 standard drinks per week. She reports that she does not use drugs.  Past Medical History:  Diagnosis Date   Aneurysm of renal artery ([place])    Fracture of right wrist 2011   Fell in bathroom.   Hypertension    Left thyroid nodule 2003   S/P biopsy - benign   Low sodium levels    Lymphocytic colitis 03/2013   Migraines    Osteoporosis    Sciatica    Visceral injury    detached due to recent fall    Past Surgical History:  Procedure Laterality Date   ABDOMINOPLASTY     APPENDECTOMY  1962   BREAST EXCISIONAL BIOPSY Bilateral 2001   BREAST SURGERY     S/P multiple breast biopsies.   CATARACT EXTRACTION, BILATERAL Bilateral 01/2020    Dental implants  1993   HEMORRHOID SURGERY  2000   KNEE ARTHROSCOPY W/ DEBRIDEMENT Right 07/2020   Dr. Berenice Primas   VAGINAL HYSTERECTOMY  1974   Dysfunctional uterine bleeding.    Current Outpatient Medications  Medication Sig Dispense Refill   amLODipine (NORVASC) 5 MG tablet Take 5 mg by mouth daily.     Ascorbic Acid (VITAMIN C PO) Take by mouth.     BUDESONIDE PO Take 3 mg by mouth as needed.     buPROPion (WELLBUTRIN XL) 150 MG 24 hr tablet Take 1 tablet (150 mg total) by mouth daily. 90 tablet 4   calcium carbonate (OS-CAL) 600 MG TABS Take 600 mg by mouth 3 (three) times daily with meals.     diphenhydrAMINE (BENADRYL) 25 mg capsule Take 25 mg by mouth.     fluocinonide cream (LIDEX) 0.05 %      fluticasone (FLONASE) 50 MCG/ACT nasal spray Right nostril nightly  5   gabapentin (NEURONTIN) 300 MG capsule Take 300 mg by mouth 2 (two) times daily.     gentamicin cream (GARAMYCIN) 0.1 %      L-CYSTEINE PO Take by mouth.     losartan (COZAAR) 50 MG tablet Take 50 mg  by mouth daily.     magnesium oxide (MAG-OX) 400 MG tablet Take by mouth.     Melatonin 5 MG TABS Take by mouth at bedtime.     Multiple Vitamin (MULTIVITAMIN) tablet Take 1 tablet by mouth daily.     Omega-3 1000 MG CAPS Take by mouth.     omeprazole (PRILOSEC) 20 MG capsule Take by mouth.     potassium chloride (K-DUR) 10 MEQ tablet Take 10 mEq by mouth 2 (two) times daily. Dosage not indicated by patient.  States she takes occasionally.     SUMAtriptan (IMITREX) 50 MG tablet Take by mouth as directed.     Thiamine HCl (B-1 PO) Take by mouth.     tretinoin (RETIN-A) 0.1 % cream      vitamin B-12 (CYANOCOBALAMIN) 1000 MCG tablet Take by mouth daily.     VITAMIN D PO Take 1,200 Int'l Units by mouth.     No current facility-administered medications for this visit.    Family History  Problem Relation Age of Onset   Hypertension Mother    Cancer Sister        melanoma   Autoimmune disease Sister        crest and  sjor   Breast cancer Daughter 71   Lupus Brother     Review of Systems  Constitutional: Negative.   Gastrointestinal: Negative.   Genitourinary: Negative.   Psychiatric/Behavioral: Negative.     Exam:   BP (!) 158/79   Pulse 75   Ht 5' 2.5" (1.588 m)   Wt 110 lb 6.4 oz (50.1 kg)   LMP 12/14/1972   BMI 19.87 kg/m   Height: 5' 2.5" (158.8 cm)  General appearance: alert, cooperative and appears stated age Breasts: normal appearance, no masses or tenderness Abdomen: soft, non-tender; bowel sounds normal; no masses,  no organomegaly Lymph nodes: Cervical, supraclavicular, and axillary nodes normal.  No abnormal inguinal nodes palpated Neurologic: Grossly normal  Pelvic: External genitalia:  no lesions              Urethra:  normal appearing urethra with no masses, tenderness or lesions              Bartholins and Skenes: normal                 Vagina: normal appearing vagina with atrophic changes and no discharge, no lesions              Cervix: absent              Pap taken: No. Bimanual Exam:  Uterus:  uterus absent              Adnexa: no mass, fullness, tenderness               Rectovaginal: Confirms               Anus:  normal sphincter tone, no lesions  Chaperone, Ezekiel Ina, RN, was present for exam.  Assessment/Plan: 1. Encntr for gyn exam (general) (routine) w/o abn findings - Pap smear not indicated - Mammogram 6//29/22 - Colonoscopy 03/19/2013 - Bone mineral density 10/15/20 - lab work done done with Dr. Jacelyn Grip - vaccines reviewed/updated (I do not have dates for pneumonia or Covid vaccination)  2. Postmenopausal - no HRT  3. H/O: hysterectomy - h/o TVH due to DUB  4. Age-related osteoporosis without current pathological fracture - on Prolia.  Next injection scheduled for September.  Did well with first injection -  Plan BMD in 2 years  5. Primary hypertension - BP elevated today.  D/w pt.  She is taking her medication and has cuff at home.  Will monitor and  follow up with Dr. Jacelyn Grip if needed  6. Thyroid nodule - biopsied in 7/12.  Followed by Dr. Buddy Duty  7. Focal lymphocytic colitis, h/o  8. History of depression - buPROPion (WELLBUTRIN XL) 150 MG 24 hr tablet; Take 1 tablet (150 mg total) by mouth daily.  Dispense: 90 tablet; Refill: 4

## 2021-05-11 DIAGNOSIS — Z201 Contact with and (suspected) exposure to tuberculosis: Secondary | ICD-10-CM | POA: Diagnosis not present

## 2021-05-11 DIAGNOSIS — E042 Nontoxic multinodular goiter: Secondary | ICD-10-CM | POA: Diagnosis not present

## 2021-05-11 DIAGNOSIS — Z87891 Personal history of nicotine dependence: Secondary | ICD-10-CM | POA: Diagnosis not present

## 2021-05-11 DIAGNOSIS — E871 Hypo-osmolality and hyponatremia: Secondary | ICD-10-CM | POA: Diagnosis not present

## 2021-05-12 DIAGNOSIS — M7918 Myalgia, other site: Secondary | ICD-10-CM | POA: Diagnosis not present

## 2021-05-12 DIAGNOSIS — G5702 Lesion of sciatic nerve, left lower limb: Secondary | ICD-10-CM | POA: Diagnosis not present

## 2021-05-12 DIAGNOSIS — M25561 Pain in right knee: Secondary | ICD-10-CM | POA: Diagnosis not present

## 2021-05-12 DIAGNOSIS — G8929 Other chronic pain: Secondary | ICD-10-CM | POA: Diagnosis not present

## 2021-05-12 DIAGNOSIS — M47816 Spondylosis without myelopathy or radiculopathy, lumbar region: Secondary | ICD-10-CM | POA: Diagnosis not present

## 2021-05-13 DIAGNOSIS — Z8659 Personal history of other mental and behavioral disorders: Secondary | ICD-10-CM | POA: Insufficient documentation

## 2021-05-13 DIAGNOSIS — E041 Nontoxic single thyroid nodule: Secondary | ICD-10-CM | POA: Insufficient documentation

## 2021-05-13 DIAGNOSIS — Z780 Asymptomatic menopausal state: Secondary | ICD-10-CM | POA: Insufficient documentation

## 2021-05-13 DIAGNOSIS — Z90710 Acquired absence of both cervix and uterus: Secondary | ICD-10-CM | POA: Insufficient documentation

## 2021-05-13 DIAGNOSIS — I10 Essential (primary) hypertension: Secondary | ICD-10-CM | POA: Insufficient documentation

## 2021-05-19 ENCOUNTER — Ambulatory Visit (HOSPITAL_BASED_OUTPATIENT_CLINIC_OR_DEPARTMENT_OTHER): Payer: Medicare Other

## 2021-05-21 ENCOUNTER — Other Ambulatory Visit: Payer: Self-pay

## 2021-05-21 ENCOUNTER — Ambulatory Visit (INDEPENDENT_AMBULATORY_CARE_PROVIDER_SITE_OTHER): Payer: Medicare Other | Admitting: *Deleted

## 2021-05-21 ENCOUNTER — Ambulatory Visit (HOSPITAL_BASED_OUTPATIENT_CLINIC_OR_DEPARTMENT_OTHER): Payer: Medicare Other | Admitting: Obstetrics & Gynecology

## 2021-05-21 DIAGNOSIS — M810 Age-related osteoporosis without current pathological fracture: Secondary | ICD-10-CM | POA: Diagnosis not present

## 2021-05-21 MED ORDER — DENOSUMAB 60 MG/ML ~~LOC~~ SOSY
60.0000 mg | PREFILLED_SYRINGE | Freq: Once | SUBCUTANEOUS | Status: AC
  Administered 2021-05-21: 60 mg via SUBCUTANEOUS

## 2021-05-21 NOTE — Progress Notes (Signed)
Patient came in today to have Prolia injection. Patient was given Prolia '60mg'$  subcutaneously in the left arm. Patient tolerated injection well. tbw

## 2021-05-31 DIAGNOSIS — Z23 Encounter for immunization: Secondary | ICD-10-CM | POA: Diagnosis not present

## 2021-06-07 DIAGNOSIS — G8929 Other chronic pain: Secondary | ICD-10-CM | POA: Diagnosis not present

## 2021-06-07 DIAGNOSIS — M7918 Myalgia, other site: Secondary | ICD-10-CM | POA: Diagnosis not present

## 2021-06-07 DIAGNOSIS — M1711 Unilateral primary osteoarthritis, right knee: Secondary | ICD-10-CM | POA: Diagnosis not present

## 2021-06-07 DIAGNOSIS — M47816 Spondylosis without myelopathy or radiculopathy, lumbar region: Secondary | ICD-10-CM | POA: Diagnosis not present

## 2021-06-09 ENCOUNTER — Encounter (HOSPITAL_BASED_OUTPATIENT_CLINIC_OR_DEPARTMENT_OTHER): Payer: Self-pay | Admitting: Obstetrics & Gynecology

## 2021-06-09 DIAGNOSIS — R197 Diarrhea, unspecified: Secondary | ICD-10-CM | POA: Diagnosis not present

## 2021-06-09 DIAGNOSIS — Z7952 Long term (current) use of systemic steroids: Secondary | ICD-10-CM | POA: Diagnosis not present

## 2021-06-09 DIAGNOSIS — R1030 Lower abdominal pain, unspecified: Secondary | ICD-10-CM | POA: Diagnosis not present

## 2021-06-09 DIAGNOSIS — Z79899 Other long term (current) drug therapy: Secondary | ICD-10-CM | POA: Diagnosis not present

## 2021-06-09 DIAGNOSIS — K52832 Lymphocytic colitis: Secondary | ICD-10-CM | POA: Diagnosis not present

## 2021-06-10 DIAGNOSIS — M7918 Myalgia, other site: Secondary | ICD-10-CM | POA: Diagnosis not present

## 2021-06-10 DIAGNOSIS — M47816 Spondylosis without myelopathy or radiculopathy, lumbar region: Secondary | ICD-10-CM | POA: Diagnosis not present

## 2021-06-10 DIAGNOSIS — G8929 Other chronic pain: Secondary | ICD-10-CM | POA: Diagnosis not present

## 2021-06-21 DIAGNOSIS — G43719 Chronic migraine without aura, intractable, without status migrainosus: Secondary | ICD-10-CM | POA: Diagnosis not present

## 2021-06-21 DIAGNOSIS — G8929 Other chronic pain: Secondary | ICD-10-CM | POA: Diagnosis not present

## 2021-06-21 DIAGNOSIS — G43919 Migraine, unspecified, intractable, without status migrainosus: Secondary | ICD-10-CM | POA: Diagnosis not present

## 2021-06-24 DIAGNOSIS — M7918 Myalgia, other site: Secondary | ICD-10-CM | POA: Diagnosis not present

## 2021-06-24 DIAGNOSIS — G8929 Other chronic pain: Secondary | ICD-10-CM | POA: Diagnosis not present

## 2021-06-28 DIAGNOSIS — G4700 Insomnia, unspecified: Secondary | ICD-10-CM | POA: Diagnosis not present

## 2021-06-28 DIAGNOSIS — M810 Age-related osteoporosis without current pathological fracture: Secondary | ICD-10-CM | POA: Diagnosis not present

## 2021-06-28 DIAGNOSIS — E042 Nontoxic multinodular goiter: Secondary | ICD-10-CM | POA: Diagnosis not present

## 2021-06-28 DIAGNOSIS — D692 Other nonthrombocytopenic purpura: Secondary | ICD-10-CM | POA: Diagnosis not present

## 2021-06-28 DIAGNOSIS — I722 Aneurysm of renal artery: Secondary | ICD-10-CM | POA: Diagnosis not present

## 2021-06-28 DIAGNOSIS — R197 Diarrhea, unspecified: Secondary | ICD-10-CM | POA: Diagnosis not present

## 2021-06-28 DIAGNOSIS — I10 Essential (primary) hypertension: Secondary | ICD-10-CM | POA: Diagnosis not present

## 2021-06-28 DIAGNOSIS — M47816 Spondylosis without myelopathy or radiculopathy, lumbar region: Secondary | ICD-10-CM | POA: Diagnosis not present

## 2021-06-28 DIAGNOSIS — E871 Hypo-osmolality and hyponatremia: Secondary | ICD-10-CM | POA: Diagnosis not present

## 2021-07-12 DIAGNOSIS — M25561 Pain in right knee: Secondary | ICD-10-CM | POA: Diagnosis not present

## 2021-07-12 DIAGNOSIS — G5702 Lesion of sciatic nerve, left lower limb: Secondary | ICD-10-CM | POA: Diagnosis not present

## 2021-07-12 DIAGNOSIS — M47816 Spondylosis without myelopathy or radiculopathy, lumbar region: Secondary | ICD-10-CM | POA: Diagnosis not present

## 2021-07-12 DIAGNOSIS — M7918 Myalgia, other site: Secondary | ICD-10-CM | POA: Diagnosis not present

## 2021-07-12 DIAGNOSIS — G8929 Other chronic pain: Secondary | ICD-10-CM | POA: Diagnosis not present

## 2021-07-15 DIAGNOSIS — Z0000 Encounter for general adult medical examination without abnormal findings: Secondary | ICD-10-CM | POA: Diagnosis not present

## 2021-07-15 DIAGNOSIS — Z1389 Encounter for screening for other disorder: Secondary | ICD-10-CM | POA: Diagnosis not present

## 2021-07-19 DIAGNOSIS — H43393 Other vitreous opacities, bilateral: Secondary | ICD-10-CM | POA: Diagnosis not present

## 2021-07-19 DIAGNOSIS — H353131 Nonexudative age-related macular degeneration, bilateral, early dry stage: Secondary | ICD-10-CM | POA: Diagnosis not present

## 2021-07-19 DIAGNOSIS — Z961 Presence of intraocular lens: Secondary | ICD-10-CM | POA: Diagnosis not present

## 2021-07-23 DIAGNOSIS — I722 Aneurysm of renal artery: Secondary | ICD-10-CM | POA: Diagnosis not present

## 2021-07-23 DIAGNOSIS — M810 Age-related osteoporosis without current pathological fracture: Secondary | ICD-10-CM | POA: Diagnosis not present

## 2021-07-23 DIAGNOSIS — I10 Essential (primary) hypertension: Secondary | ICD-10-CM | POA: Diagnosis not present

## 2021-07-23 DIAGNOSIS — E871 Hypo-osmolality and hyponatremia: Secondary | ICD-10-CM | POA: Diagnosis not present

## 2021-07-23 DIAGNOSIS — H04123 Dry eye syndrome of bilateral lacrimal glands: Secondary | ICD-10-CM | POA: Diagnosis not present

## 2021-07-23 DIAGNOSIS — M47816 Spondylosis without myelopathy or radiculopathy, lumbar region: Secondary | ICD-10-CM | POA: Diagnosis not present

## 2021-07-23 DIAGNOSIS — D692 Other nonthrombocytopenic purpura: Secondary | ICD-10-CM | POA: Diagnosis not present

## 2021-07-23 DIAGNOSIS — M5430 Sciatica, unspecified side: Secondary | ICD-10-CM | POA: Diagnosis not present

## 2021-07-23 DIAGNOSIS — E042 Nontoxic multinodular goiter: Secondary | ICD-10-CM | POA: Diagnosis not present

## 2021-07-23 DIAGNOSIS — G4700 Insomnia, unspecified: Secondary | ICD-10-CM | POA: Diagnosis not present

## 2021-08-17 DIAGNOSIS — G5702 Lesion of sciatic nerve, left lower limb: Secondary | ICD-10-CM | POA: Diagnosis not present

## 2021-08-23 DIAGNOSIS — G4700 Insomnia, unspecified: Secondary | ICD-10-CM | POA: Diagnosis not present

## 2021-08-23 DIAGNOSIS — M8580 Other specified disorders of bone density and structure, unspecified site: Secondary | ICD-10-CM | POA: Diagnosis not present

## 2021-08-23 DIAGNOSIS — I10 Essential (primary) hypertension: Secondary | ICD-10-CM | POA: Diagnosis not present

## 2021-08-23 DIAGNOSIS — M818 Other osteoporosis without current pathological fracture: Secondary | ICD-10-CM | POA: Diagnosis not present

## 2021-08-23 DIAGNOSIS — I722 Aneurysm of renal artery: Secondary | ICD-10-CM | POA: Diagnosis not present

## 2021-09-01 ENCOUNTER — Other Ambulatory Visit: Payer: Self-pay

## 2021-09-01 ENCOUNTER — Ambulatory Visit (INDEPENDENT_AMBULATORY_CARE_PROVIDER_SITE_OTHER): Payer: Medicare Other | Admitting: Podiatry

## 2021-09-01 DIAGNOSIS — L658 Other specified nonscarring hair loss: Secondary | ICD-10-CM | POA: Diagnosis not present

## 2021-09-01 DIAGNOSIS — L600 Ingrowing nail: Secondary | ICD-10-CM | POA: Diagnosis not present

## 2021-09-01 DIAGNOSIS — L650 Telogen effluvium: Secondary | ICD-10-CM | POA: Diagnosis not present

## 2021-09-01 NOTE — Progress Notes (Signed)
° °  Subjective: Patient presents today for new complaint for evaluation of pain to the medial border left great toe. Patient is concerned for possible ingrown nail.  It is very sensitive to touch.  Patient does have history of ingrown to the lateral border of the left great toe and partial nail matricectomy was performed and she did very well with it.  Patient presents today for further treatment and evaluation.  Past Medical History:  Diagnosis Date   Aneurysm of renal artery ([place])    Fracture of right wrist 2011   Fell in bathroom.   Hypertension    Left thyroid nodule 2003   S/P biopsy - benign   Low sodium levels    Lymphocytic colitis 03/2013   Migraines    Osteoporosis    Sciatica    Visceral injury    detached due to recent fall    Objective:  General: Well developed, nourished, in no acute distress, alert and oriented x3   Dermatology: Skin is warm, dry and supple bilateral.  Medial border left great toe appears to be erythematous with evidence of an ingrowing nail. Pain on palpation noted to the border of the nail fold. The remaining nails appear unremarkable at this time. There are no open sores, lesions.  Vascular: Dorsalis Pedis artery and Posterior Tibial artery pedal pulses palpable. No lower extremity edema noted.   Neruologic: Grossly intact via light touch bilateral.  Musculoskeletal: Muscular strength within normal limits in all groups bilateral. Normal range of motion noted to all pedal and ankle joints.   Assesement: #1 Paronychia with ingrowing nail medial border left great toe #2 Pain in toe  Plan of Care:  1. Patient evaluated.  2. Discussed treatment alternatives and plan of care. Explained nail avulsion procedure and post procedure course to patient. 3. Patient opted for permanent partial nail avulsion of the ingrown portion of the nail.  4. Prior to procedure, local anesthesia infiltration utilized using 3 ml of a 50:50 mixture of 2% plain lidocaine  and 0.5% plain marcaine in a normal hallux block fashion and a betadine prep performed.  5. Partial permanent nail avulsion with chemical matrixectomy performed using [id number] applications of phenol followed by alcohol flush.  6. Light dressing applied.  Post care instructions provided 7.  Patient has gentamicin 2% cream at home.  Apply 2 times daily 8.  Return to clinic 2 weeks.  Edrick Kins, DPM [facility]  Dr. Edrick Kins, DPM    [address], [place] [address]                Office [phone]  Fax [phone]

## 2021-09-01 NOTE — Patient Instructions (Signed)

## 2021-09-03 DIAGNOSIS — M5137 Other intervertebral disc degeneration, lumbosacral region: Secondary | ICD-10-CM | POA: Diagnosis not present

## 2021-09-03 DIAGNOSIS — M5136 Other intervertebral disc degeneration, lumbar region: Secondary | ICD-10-CM | POA: Diagnosis not present

## 2021-09-03 DIAGNOSIS — M25561 Pain in right knee: Secondary | ICD-10-CM | POA: Diagnosis not present

## 2021-09-03 DIAGNOSIS — M7918 Myalgia, other site: Secondary | ICD-10-CM | POA: Diagnosis not present

## 2021-09-03 DIAGNOSIS — M5416 Radiculopathy, lumbar region: Secondary | ICD-10-CM | POA: Diagnosis not present

## 2021-09-03 DIAGNOSIS — M47816 Spondylosis without myelopathy or radiculopathy, lumbar region: Secondary | ICD-10-CM | POA: Diagnosis not present

## 2021-09-08 ENCOUNTER — Encounter: Payer: Self-pay | Admitting: Podiatry

## 2021-09-08 ENCOUNTER — Ambulatory Visit (INDEPENDENT_AMBULATORY_CARE_PROVIDER_SITE_OTHER): Payer: Medicare Other | Admitting: Podiatry

## 2021-09-08 ENCOUNTER — Other Ambulatory Visit: Payer: Self-pay

## 2021-09-08 DIAGNOSIS — L600 Ingrowing nail: Secondary | ICD-10-CM

## 2021-09-08 MED ORDER — CEPHALEXIN 500 MG PO CAPS: 500.0000 mg | ORAL_CAPSULE | Freq: Four times a day (QID) | ORAL | 0 refills | Status: DC

## 2021-09-08 MED ORDER — CEPHALEXIN 500 MG PO CAPS: 500.0000 mg | ORAL_CAPSULE | Freq: Four times a day (QID) | ORAL | 0 refills | Status: AC

## 2021-09-08 NOTE — Progress Notes (Signed)
°  Subjective:  Patient ID: Elaine Navarro, female    DOB: [birth year],   MRN: [id number]  No chief complaint on file.   75 y.o. female presents for follow-up of left great ingrown toenail procedure. Patient relates the toe has been red and painful especially at night. Was concerned for infection and wanted it to be evaluated . Denies any other pedal complaints. Denies n/v/f/c.   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   Hypertension    Left thyroid nodule 2003   S/P biopsy - benign   Low sodium levels    Lymphocytic colitis 03/2013   Migraines    Osteoporosis    Sciatica    Visceral injury    detached due to recent fall    Objective:  Physical Exam: Vascular: DP/PT pulses 2/4 bilateral. CFT <3 seconds. Normal hair growth on digits. No edema.  Skin. No lacerations or abrasions bilateral feet. Left hallux nail healing well some mild erythema and tenderness.  Musculoskeletal: MMT 5/5 bilateral lower extremities in DF, PF, Inversion and Eversion. Deceased ROM in DF of ankle joint.  Neurological: Sensation intact to light touch.   Assessment:   1. Ingrown toenail of left foot      Plan:  Patient was evaluated and treated and all questions answered. Toe was evaluated and appears to be healing well.  Continue soaks and gentimicin otinment.  Will send over a prescription for keflex for any potential infection.  Patient to follow-up as needed.    Lorenda Peck, DPM

## 2021-09-15 DIAGNOSIS — M7918 Myalgia, other site: Secondary | ICD-10-CM | POA: Diagnosis not present

## 2021-09-15 DIAGNOSIS — M47816 Spondylosis without myelopathy or radiculopathy, lumbar region: Secondary | ICD-10-CM | POA: Diagnosis not present

## 2021-09-15 DIAGNOSIS — G8929 Other chronic pain: Secondary | ICD-10-CM | POA: Diagnosis not present

## 2021-09-15 DIAGNOSIS — M7062 Trochanteric bursitis, left hip: Secondary | ICD-10-CM | POA: Diagnosis not present

## 2021-09-20 DIAGNOSIS — H353131 Nonexudative age-related macular degeneration, bilateral, early dry stage: Secondary | ICD-10-CM | POA: Diagnosis not present

## 2021-09-20 DIAGNOSIS — H43393 Other vitreous opacities, bilateral: Secondary | ICD-10-CM | POA: Diagnosis not present

## 2021-09-20 DIAGNOSIS — K5190 Ulcerative colitis, unspecified, without complications: Secondary | ICD-10-CM | POA: Diagnosis not present

## 2021-09-20 DIAGNOSIS — H43813 Vitreous degeneration, bilateral: Secondary | ICD-10-CM | POA: Diagnosis not present

## 2021-09-27 ENCOUNTER — Ambulatory Visit (INDEPENDENT_AMBULATORY_CARE_PROVIDER_SITE_OTHER): Payer: Medicare Other | Admitting: Podiatry

## 2021-09-27 ENCOUNTER — Other Ambulatory Visit: Payer: Self-pay

## 2021-09-27 DIAGNOSIS — L600 Ingrowing nail: Secondary | ICD-10-CM

## 2021-09-27 NOTE — Progress Notes (Signed)
° °  Subjective: 76 y.o. female presents today status post permanent nail avulsion procedure of the left great toe medial border that was performed on 09/08/2021.  Patient states that she is doing well.  She no longer has pain or tenderness associated to the area.  She did come in 1 week later and was prescribed some oral antibiotics.  She says that her significant improvement.  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   Hypertension    Left thyroid nodule 2003   S/P biopsy - benign   Low sodium levels    Lymphocytic colitis 03/2013   Migraines    Osteoporosis    Sciatica    Visceral injury    detached due to recent fall    Objective: Skin is warm, dry and supple. Nail and respective nail fold appears to be healing appropriately. Open wound to the associated nail fold with a granular wound base and moderate amount of fibrotic tissue. Minimal drainage noted. Mild erythema around the periungual region likely due to phenol chemical matricectomy.  Assessment: #1 s/p partial permanent nail matrixectomy medial border left great toe   Plan of care: #1 patient was evaluated  #2 light debridement of open wound was performed to the periungual border of the respective toe using a currette. Antibiotic ointment and Band-Aid was applied. #3 patient is to return to clinic on a PRN basis.   Edrick Kins, DPM [facility]  Dr. Edrick Kins, DPM    [address], [place] [address]                Office [phone]  Fax [phone]

## 2021-09-28 ENCOUNTER — Ambulatory Visit
Admission: RE | Admit: 2021-09-28 | Discharge: 2021-09-28 | Disposition: A | Discharge status: Home / Self Care | Payer: Medicare Other | Source: Ambulatory Visit | Attending: Family Medicine | Admitting: Family Medicine

## 2021-09-28 ENCOUNTER — Other Ambulatory Visit: Payer: Self-pay | Admitting: Family Medicine

## 2021-09-28 DIAGNOSIS — M25561 Pain in right knee: Secondary | ICD-10-CM | POA: Diagnosis not present

## 2021-09-28 DIAGNOSIS — R053 Chronic cough: Secondary | ICD-10-CM | POA: Diagnosis not present

## 2021-09-28 DIAGNOSIS — R059 Cough, unspecified: Secondary | ICD-10-CM | POA: Diagnosis not present

## 2021-09-28 DIAGNOSIS — M1711 Unilateral primary osteoarthritis, right knee: Secondary | ICD-10-CM | POA: Diagnosis not present

## 2021-10-04 DIAGNOSIS — G43719 Chronic migraine without aura, intractable, without status migrainosus: Secondary | ICD-10-CM | POA: Diagnosis not present

## 2021-10-06 DIAGNOSIS — R634 Abnormal weight loss: Secondary | ICD-10-CM | POA: Diagnosis not present

## 2021-10-06 DIAGNOSIS — K52832 Lymphocytic colitis: Secondary | ICD-10-CM | POA: Diagnosis not present

## 2021-10-06 DIAGNOSIS — L659 Nonscarring hair loss, unspecified: Secondary | ICD-10-CM | POA: Diagnosis not present

## 2021-10-06 DIAGNOSIS — Z681 Body mass index (BMI) 19 or less, adult: Secondary | ICD-10-CM | POA: Diagnosis not present

## 2021-10-06 DIAGNOSIS — R197 Diarrhea, unspecified: Secondary | ICD-10-CM | POA: Diagnosis not present

## 2021-10-08 DIAGNOSIS — M7062 Trochanteric bursitis, left hip: Secondary | ICD-10-CM | POA: Diagnosis not present

## 2021-10-08 DIAGNOSIS — M7918 Myalgia, other site: Secondary | ICD-10-CM | POA: Diagnosis not present

## 2021-10-08 DIAGNOSIS — G5702 Lesion of sciatic nerve, left lower limb: Secondary | ICD-10-CM | POA: Diagnosis not present

## 2021-10-08 DIAGNOSIS — M5136 Other intervertebral disc degeneration, lumbar region: Secondary | ICD-10-CM | POA: Diagnosis not present

## 2021-10-08 DIAGNOSIS — G8929 Other chronic pain: Secondary | ICD-10-CM | POA: Diagnosis not present

## 2021-10-12 ENCOUNTER — Other Ambulatory Visit (HOSPITAL_COMMUNITY): Payer: Self-pay | Admitting: Vascular Surgery

## 2021-10-12 DIAGNOSIS — I722 Aneurysm of renal artery: Secondary | ICD-10-CM

## 2021-10-19 DIAGNOSIS — S83281A Other tear of lateral meniscus, current injury, right knee, initial encounter: Secondary | ICD-10-CM | POA: Diagnosis not present

## 2021-10-19 DIAGNOSIS — M25561 Pain in right knee: Secondary | ICD-10-CM | POA: Diagnosis not present

## 2021-10-21 ENCOUNTER — Encounter (HOSPITAL_BASED_OUTPATIENT_CLINIC_OR_DEPARTMENT_OTHER): Payer: Self-pay | Admitting: Obstetrics & Gynecology

## 2021-10-21 ENCOUNTER — Ambulatory Visit (HOSPITAL_BASED_OUTPATIENT_CLINIC_OR_DEPARTMENT_OTHER): Payer: Medicare Other

## 2021-10-21 ENCOUNTER — Other Ambulatory Visit: Payer: Self-pay

## 2021-10-21 ENCOUNTER — Ambulatory Visit (HOSPITAL_BASED_OUTPATIENT_CLINIC_OR_DEPARTMENT_OTHER)
Admission: RE | Admit: 2021-10-21 | Discharge: 2021-10-21 | Disposition: A | Discharge status: Home / Self Care | Payer: Medicare Other | Source: Ambulatory Visit | Attending: Vascular Surgery | Admitting: Vascular Surgery

## 2021-10-21 DIAGNOSIS — I722 Aneurysm of renal artery: Secondary | ICD-10-CM | POA: Insufficient documentation

## 2021-10-21 DIAGNOSIS — J9811 Atelectasis: Secondary | ICD-10-CM | POA: Diagnosis not present

## 2021-10-21 LAB — POCT I-STAT CREATININE: Creatinine, Ser: 0.7 mg/dL (ref 0.44–1.00)

## 2021-10-21 MED ORDER — IOHEXOL 350 MG/ML SOLN
75.0000 mL | Freq: Once | INTRAVENOUS | Status: AC | PRN
Start: 2021-10-21 — End: 2021-10-21
  Administered 2021-10-21: 75 mL via INTRAVENOUS

## 2021-10-23 DIAGNOSIS — M25561 Pain in right knee: Secondary | ICD-10-CM | POA: Diagnosis not present

## 2021-10-26 ENCOUNTER — Encounter (HOSPITAL_BASED_OUTPATIENT_CLINIC_OR_DEPARTMENT_OTHER): Payer: Self-pay | Admitting: Obstetrics & Gynecology

## 2021-10-28 DIAGNOSIS — M1711 Unilateral primary osteoarthritis, right knee: Secondary | ICD-10-CM | POA: Diagnosis not present

## 2021-10-28 DIAGNOSIS — M25561 Pain in right knee: Secondary | ICD-10-CM | POA: Diagnosis not present

## 2021-10-28 DIAGNOSIS — M5441 Lumbago with sciatica, right side: Secondary | ICD-10-CM | POA: Diagnosis not present

## 2021-11-01 ENCOUNTER — Ambulatory Visit (HOSPITAL_BASED_OUTPATIENT_CLINIC_OR_DEPARTMENT_OTHER): Payer: Medicare Other

## 2021-11-01 DIAGNOSIS — M7918 Myalgia, other site: Secondary | ICD-10-CM | POA: Diagnosis not present

## 2021-11-01 DIAGNOSIS — G8929 Other chronic pain: Secondary | ICD-10-CM | POA: Diagnosis not present

## 2021-11-02 ENCOUNTER — Other Ambulatory Visit: Payer: Self-pay

## 2021-11-02 ENCOUNTER — Ambulatory Visit (HOSPITAL_BASED_OUTPATIENT_CLINIC_OR_DEPARTMENT_OTHER): Payer: Medicare Other

## 2021-11-02 ENCOUNTER — Encounter (HOSPITAL_BASED_OUTPATIENT_CLINIC_OR_DEPARTMENT_OTHER): Payer: Self-pay

## 2021-11-05 DIAGNOSIS — M47816 Spondylosis without myelopathy or radiculopathy, lumbar region: Secondary | ICD-10-CM | POA: Diagnosis not present

## 2021-11-05 DIAGNOSIS — D692 Other nonthrombocytopenic purpura: Secondary | ICD-10-CM | POA: Diagnosis not present

## 2021-11-05 DIAGNOSIS — E871 Hypo-osmolality and hyponatremia: Secondary | ICD-10-CM | POA: Diagnosis not present

## 2021-11-05 DIAGNOSIS — E042 Nontoxic multinodular goiter: Secondary | ICD-10-CM | POA: Diagnosis not present

## 2021-11-05 DIAGNOSIS — G8929 Other chronic pain: Secondary | ICD-10-CM | POA: Diagnosis not present

## 2021-11-05 DIAGNOSIS — R911 Solitary pulmonary nodule: Secondary | ICD-10-CM | POA: Diagnosis not present

## 2021-11-05 DIAGNOSIS — I700 Atherosclerosis of aorta: Secondary | ICD-10-CM | POA: Diagnosis not present

## 2021-11-05 DIAGNOSIS — I722 Aneurysm of renal artery: Secondary | ICD-10-CM | POA: Diagnosis not present

## 2021-11-05 DIAGNOSIS — M810 Age-related osteoporosis without current pathological fracture: Secondary | ICD-10-CM | POA: Diagnosis not present

## 2021-11-05 DIAGNOSIS — I10 Essential (primary) hypertension: Secondary | ICD-10-CM | POA: Diagnosis not present

## 2021-11-05 DIAGNOSIS — G4700 Insomnia, unspecified: Secondary | ICD-10-CM | POA: Diagnosis not present

## 2021-11-09 ENCOUNTER — Other Ambulatory Visit: Payer: Self-pay

## 2021-11-09 ENCOUNTER — Encounter: Payer: Self-pay | Admitting: Internal Medicine

## 2021-11-09 ENCOUNTER — Ambulatory Visit (INDEPENDENT_AMBULATORY_CARE_PROVIDER_SITE_OTHER): Payer: Medicare Other | Admitting: Internal Medicine

## 2021-11-09 VITALS — BP 112/72 | HR 78 | Temp 97.8°F | Ht 63.0 in | Wt 109.4 lb

## 2021-11-09 DIAGNOSIS — G894 Chronic pain syndrome: Secondary | ICD-10-CM | POA: Diagnosis not present

## 2021-11-09 DIAGNOSIS — Z87891 Personal history of nicotine dependence: Secondary | ICD-10-CM | POA: Diagnosis not present

## 2021-11-09 DIAGNOSIS — R911 Solitary pulmonary nodule: Secondary | ICD-10-CM

## 2021-11-09 DIAGNOSIS — R0609 Other forms of dyspnea: Secondary | ICD-10-CM | POA: Diagnosis not present

## 2021-11-09 DIAGNOSIS — M461 Sacroiliitis, not elsewhere classified: Secondary | ICD-10-CM | POA: Diagnosis not present

## 2021-11-09 DIAGNOSIS — M5432 Sciatica, left side: Secondary | ICD-10-CM | POA: Diagnosis not present

## 2021-11-09 DIAGNOSIS — Z825 Family history of asthma and other chronic lower respiratory diseases: Secondary | ICD-10-CM | POA: Diagnosis not present

## 2021-11-09 DIAGNOSIS — M71552 Other bursitis, not elsewhere classified, left hip: Secondary | ICD-10-CM | POA: Diagnosis not present

## 2021-11-09 DIAGNOSIS — Z79891 Long term (current) use of opiate analgesic: Secondary | ICD-10-CM | POA: Diagnosis not present

## 2021-11-09 DIAGNOSIS — M25561 Pain in right knee: Secondary | ICD-10-CM | POA: Diagnosis not present

## 2021-11-09 DIAGNOSIS — Z201 Contact with and (suspected) exposure to tuberculosis: Secondary | ICD-10-CM | POA: Diagnosis not present

## 2021-11-09 NOTE — Progress Notes (Signed)
OV 11/09/2021  Subjective:  Patient ID: Elaine Navarro, female , DOB: May 28, 1946 , age 76 y.o. , MRN: [id number] , ADDRESS: [place] [id number] PCP Elaine Shanks, MD Patient Care Team: Elaine Shanks, MD as PCP - General (Family Medicine)  This Provider for this visit: Treatment Team:  Attending Provider: Brand Males, MD    11/09/2021 -   Chief Complaint  Patient presents with   Consult    Pt had a CT performed which is the reason for today's visit.     HPI Elaine Navarro 76 y.o. -presents with her husband.  Concern is left lower lobe nodule 4 mm seen on CT angiogram abdomen done for renal artery aneurysm.  She sees physician at [facility] for renal artery aneurysm.  During this time she had a CT scan of the abdomen done with contrast here in [place].  This showed a 4 mm lung nodule in the left lower lobe.  According to Dr. Burt Navarro it is stable over 10 years.  I personally visualized this.  Some bibasilar atelectasis also reported although I am not convinced about this.  Patient herself states that overall she is stable.  She says that in July 2022 she had COVID and after that she had significant cough.  Primary care treated with cough Tessalon Perles.  The cough is now resolved for the last 5 weeks.  Currently no undue shortness of breath.  No cough no wheezing no paroxysmal nocturnal dyspnea no orthopnea.  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  She is a remote 30 pack smoker having quit in 1984 when she moved from [place] to [place].  While in [place] as a child she got exposed to her both grandfather and aunt with tuberculosis.  She says she herself was skin test positive but denies any latent TB treatment.  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  But this was just general air pollution.  She is worried about the implications of all this for her health.  She says clinically her previous primary care physician told her she might have COPD.  She also is a brother with COPD.  She wants to be sure that there is no undue health issues with her lung.  She also wants to continue doing ballroom dancing at a championship level.    CT Chest data 10/21/21   Narrative & Impression  CLINICAL DATA:  Renal artery aneurysm   EXAM: CT ANGIOGRAPHY ABDOMEN   TECHNIQUE: Multidetector CT imaging of the abdomen was performed using the standard protocol during bolus administration of intravenous contrast. Multiplanar reconstructed images and MIPs were obtained and reviewed to evaluate the vascular anatomy.   RADIATION DOSE REDUCTION: This exam was performed according to the departmental dose-optimization program which includes automated exposure control, adjustment of the mA and/or kV according to patient size and/or use of iterative reconstruction technique.   CONTRAST:  39mL OMNIPAQUE IOHEXOL 350 MG/ML SOLN   COMPARISON:  CT abdomen dated August 13, 2012   FINDINGS: VASCULAR   Aorta: Normal caliber abdominal aorta with mild calcified plaque and no significant stenosis.   Celiac: Normal caliber celiac artery with no significant atherosclerotic disease or stenosis.   SMA: Visualized SMA is normal in  caliber with no significant atherosclerotic disease or stenosis.   Renals: Peripherally calcified renal artery aneurysm arising from a lower pole segmental branch measuring 1.1 x 1.1 x 1.3 cm, unchanged in size when compared to prior exam. Bilateral main renal arteries are normal in caliber with minimal calcified plaque and no stenosis.   IMA: Visualized portion is normal in caliber with no evidence of stenosis.   Veins: No venous abnormality.   Review of the MIP images confirms the above findings.    NON-VASCULAR   Lower chest: Stable solid 4 mm nodule of the left lower lobe located on image 13, likely benign. Bibasilar atelectasis.   Hepatobiliary: No focal liver abnormality is seen. No gallstones, gallbladder wall thickening, or biliary dilatation.   Pancreas: Unremarkable. No pancreatic ductal dilatation or surrounding inflammatory changes.   Spleen: Normal in size without focal abnormality.   Adrenals/Urinary Tract: Adrenal glands are unremarkable. Kidneys are normal, without renal calculi, focal lesion, or hydronephrosis. Bladder is unremarkable.   Stomach/Bowel: Visualized portions of the small and large bowel are unremarkable.   Lymphatic: No pathologically enlarged lymph nodes seen in the abdomen.   Other: No abdominal wall hernia or abdominal ascites.   Musculoskeletal: No acute or significant osseous findings.   IMPRESSION: 1. 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 2.  Aortic Atherosclerosis (ICD10-I70.0).     Electronically Signed   By: Elaine Navarro M.D.   On: 10/22/2021 10:03      No results found.    PFT  No flowsheet data found.     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   reports that she quit smoking about 38 years ago. Her smoking use included cigarettes. She has a 30.00 pack-year smoking history. She has never used smokeless tobacco.  Past Surgical History:  Procedure Laterality Date   ABDOMINOPLASTY     APPENDECTOMY  1962   BREAST EXCISIONAL BIOPSY Bilateral 2001   BREAST SURGERY     S/P multiple breast biopsies.   CATARACT EXTRACTION, BILATERAL Bilateral 01/2020   Dental implants  1993   HEMORRHOID SURGERY  2000   KNEE ARTHROSCOPY W/ DEBRIDEMENT Right 07/2020   Dr. Berenice Navarro   VAGINAL HYSTERECTOMY  1974    Dysfunctional uterine bleeding.    Allergies  Allergen Reactions   Caine-1 [Lidocaine]     Allergic to Septocaine Articaine HCL 4% w/epi   Indomethacin Swelling   Articaine-Epinephrine Other (See Comments)   Hydrocodone Nausea Only   Hydrocodone-Acetaminophen Other (See Comments)   Latex     Possible allergy.   Pancrelipase (Lip-Prot-Amyl) Other (See Comments)   Lamisil [Terbinafine] Rash    Immunization History  Administered Date(s) Administered   Hepatitis A 03/10/2003, 10/15/2003   Hepatitis B 04/15/2003, 05/21/2003, 10/15/2003   Influenza Split 06/09/2014, 06/08/2015, 06/08/2017, 06/19/2018, 05/27/2019, 05/18/2020, 05/31/2021   Influenza, High Dose Seasonal PF 06/19/2018   Influenza,inj,Quad PF,6+ Mos 06/13/2011   Influenza-Unspecified 05/16/2013   PFIZER(Purple Top)SARS-COV-2 Vaccination 10/01/2019, 10/21/2019, 05/07/2020, 12/15/2020, 05/31/2021   Pneumococcal Conjugate-13 10/18/2013   Pneumococcal Polysaccharide-23 06/22/2010   Td 03/10/2003   Tdap 11/08/2010, 12/29/2019, 01/11/2020   Typhoid Inactivated 03/10/2003, 07/05/2013   Yellow Fever 07/05/2013   Zoster, Live 01/19/2006, 12/21/2016, 06/26/2017    Family History  Problem Relation Age of Onset   Hypertension Mother    Cancer Sister  melanoma   Autoimmune disease Sister        crest and sjor   Breast cancer Daughter 81   Lupus Brother      Current Outpatient Medications:    amLODipine (NORVASC) 5 MG tablet, Take 5 mg by mouth daily., Disp: , Rfl:    Ascorbic Acid (VITAMIN C PO), Take by mouth., Disp: , Rfl:    BUDESONIDE PO, Take 3 mg by mouth as needed., Disp: , Rfl:    buPROPion (WELLBUTRIN XL) 150 MG 24 hr tablet, Take 1 tablet (150 mg total) by mouth daily., Disp: 90 tablet, Rfl: 4   calcium carbonate (OS-CAL) 600 MG TABS, Take 600 mg by mouth 3 (three) times daily with meals., Disp: , Rfl:    diclofenac Sodium (VOLTAREN) 1 % GEL, Apply topically 4 (four) times daily., Disp: , Rfl:     diphenhydrAMINE (BENADRYL) 25 mg capsule, Take 25 mg by mouth., Disp: , Rfl:    fluocinonide cream (LIDEX) 0.05 %, , Disp: , Rfl:    fluticasone (FLONASE) 50 MCG/ACT nasal spray, Right nostril nightly, Disp: , Rfl: 5   gentamicin cream (GARAMYCIN) 0.1 %, , Disp: , Rfl:    L-CYSTEINE PO, Take by mouth., Disp: , Rfl:    losartan (COZAAR) 50 MG tablet, Take 50 mg by mouth daily., Disp: , Rfl:    magnesium oxide (MAG-OX) 400 MG tablet, Take by mouth., Disp: , Rfl:    Melatonin 5 MG TABS, Take by mouth at bedtime., Disp: , Rfl:    Multiple Vitamin (MULTIVITAMIN) tablet, Take 1 tablet by mouth daily., Disp: , Rfl:    Omega-3 1000 MG CAPS, Take by mouth., Disp: , Rfl:    omeprazole (PRILOSEC) 20 MG capsule, Take by mouth., Disp: , Rfl:    potassium chloride (K-DUR) 10 MEQ tablet, Take 10 mEq by mouth 2 (two) times daily. Dosage not indicated by patient.  States she takes occasionally., Disp: , Rfl:    SUMAtriptan (IMITREX) 50 MG tablet, Take by mouth as directed., Disp: , Rfl:    Thiamine HCl (B-1 PO), Take by mouth., Disp: , Rfl:    tretinoin (RETIN-A) 0.1 % cream, , Disp: , Rfl:    vitamin B-12 (CYANOCOBALAMIN) 1000 MCG tablet, Take by mouth daily., Disp: , Rfl:    VITAMIN D PO, Take 1,200 Int'l Units by mouth., Disp: , Rfl:       Objective:   Vitals:   11/09/21 1429  BP: 112/72  Pulse: 78  Temp: 97.8 F (36.6 C)  TempSrc: Oral  SpO2: 99%  Weight: 109 lb 6.4 oz (49.6 kg)  Height: 5\' 3"  (1.6 m)    Estimated body mass index is 19.38 kg/m as calculated from the following:   Height as of this encounter: 5\' 3"  (1.6 m).   Weight as of this encounter: 109 lb 6.4 oz (49.6 kg).  @WEIGHTCHANGE @  [employer]   11/09/21 1429  Weight: 109 lb 6.4 oz (49.6 kg)     Physical Exam General: No distress. nomral Neuro: Alert and Oriented x 3. GCS 15. Speech normal Psych: Pleasant Resp:  Barrel Chest - no.  Wheeze - no, Crackles - no, No overt respiratory distress CVS: Normal heart sounds.  Murmurs - no Ext: Stigmata of Connective Tissue Disease - no HEENT: Normal upper airway. PEERL +. No post nasal drip        Assessment:       ICD-10-CM   1. Lung nodule, solitary  R91.1 CT Chest High Resolution  2. Stopped smoking with greater than 30 pack year history  Z87.891     3. Exposure to TB  Z20.1 QuantiFERON-TB Gold Plus    4. Family history of COPD (chronic obstructive pulmonary disease)  Z82.5 Pulmonary function test    QuantiFERON-TB Gold Plus    Alpha-1 antitrypsin phenotype    5. DOE (dyspnea on exertion)  R06.09 Pulmonary function test    QuantiFERON-TB Gold Plus    Alpha-1 antitrypsin phenotype         Plan:     Patient Instructions     ICD-10-CM   1. Lung nodule, solitary  R91.1     2. Stopped smoking with greater than 30 pack year history  Z87.891     3. Exposure to TB  Z20.1     4. Family history of COPD (chronic obstructive pulmonary disease)  Z82.5     5. DOE (dyspnea on exertion)  R06.09       Do blood work quantiferon gold, Alpha 1 AT phenotype Do HRCT supine and prone Do full PFT  Followup  - next 2-8 weeks with APP or [facility] video visit to review results    SIGNATURE    Dr. Brand Navarro, M.D., F.C.C.P,  Pulmonary and Critical Care Medicine Staff Physician, [place] Director - Interstitial Lung Disease  Program  [facility] [place] [facility] [place], [place], [address]  Pager: [phone], If no answer or between  15:00h - 7:00h: call [phone] Telephone: [phone]  3:09 PM 11/09/2021

## 2021-11-09 NOTE — Patient Instructions (Signed)
ICD-10-CM   1. Lung nodule, solitary  R91.1     2. Stopped smoking with greater than 30 pack year history  Z87.891     3. Exposure to TB  Z20.1     4. Family history of COPD (chronic obstructive pulmonary disease)  Z82.5     5. DOE (dyspnea on exertion)  R06.09       Do blood work quantiferon gold, Alpha 1 AT phenotype Do HRCT supine and prone Do full PFT  Followup  - next 2-8 weeks with APP or Cortnie Ringel video visit to review results

## 2021-11-12 LAB — QUANTIFERON-TB GOLD PLUS
Mitogen-NIL: >10 IU/mL
NIL: 0.04 IU/mL
QuantiFERON-TB Gold Plus: NEGATIVE
TB1-NIL: 0.13 IU/mL
TB2-NIL: 0.26 IU/mL

## 2021-11-15 ENCOUNTER — Encounter (HOSPITAL_BASED_OUTPATIENT_CLINIC_OR_DEPARTMENT_OTHER): Payer: Self-pay | Admitting: Obstetrics & Gynecology

## 2021-11-18 DIAGNOSIS — M25561 Pain in right knee: Secondary | ICD-10-CM | POA: Diagnosis not present

## 2021-11-18 DIAGNOSIS — M1711 Unilateral primary osteoarthritis, right knee: Secondary | ICD-10-CM | POA: Diagnosis not present

## 2021-11-19 LAB — ALPHA-1 ANTITRYPSIN PHENOTYPE: A-1 Antitrypsin, Ser: 152 mg/dL (ref 83–199)

## 2021-11-29 ENCOUNTER — Other Ambulatory Visit: Payer: Self-pay

## 2021-11-29 ENCOUNTER — Ambulatory Visit (INDEPENDENT_AMBULATORY_CARE_PROVIDER_SITE_OTHER): Payer: Medicare Other

## 2021-11-29 DIAGNOSIS — M810 Age-related osteoporosis without current pathological fracture: Secondary | ICD-10-CM | POA: Diagnosis not present

## 2021-11-29 MED ORDER — DENOSUMAB 60 MG/ML ~~LOC~~ SOSY
60.0000 mg | PREFILLED_SYRINGE | Freq: Once | SUBCUTANEOUS | Status: AC
Start: 2021-11-29 — End: 2021-11-29
  Administered 2021-11-29: 60 mg via SUBCUTANEOUS

## 2021-11-29 NOTE — Progress Notes (Signed)
Patient came in today for Prolia injection. Patient was given Prolia '60mg'$  in the back of her left, upper arm. Patient was watched for 15 minutes. Patient tolerated injection well.  ?

## 2021-12-01 ENCOUNTER — Encounter: Payer: Self-pay | Admitting: Orthopaedic Surgery

## 2021-12-01 ENCOUNTER — Ambulatory Visit (INDEPENDENT_AMBULATORY_CARE_PROVIDER_SITE_OTHER): Payer: Medicare Other | Admitting: Orthopaedic Surgery

## 2021-12-01 DIAGNOSIS — G8929 Other chronic pain: Secondary | ICD-10-CM

## 2021-12-01 DIAGNOSIS — M25561 Pain in right knee: Secondary | ICD-10-CM

## 2021-12-01 DIAGNOSIS — M1711 Unilateral primary osteoarthritis, right knee: Secondary | ICD-10-CM

## 2021-12-01 NOTE — Progress Notes (Signed)
? ?Office Visit Note ?  ?Patient: Elaine Navarro           ?Date of Birth: 05-15-1946           ?MRN: [id number] ?Visit Date: 12/01/2021 ?             ?Requested by: Vernie Shanks, MD ?[place],  [place] [address] ?PCP: Vernie Shanks, MD ? ? ?Assessment & Plan: ?Visit Diagnoses:  ?1. Chronic pain of right knee   ?2. Unilateral primary osteoarthritis, right knee   ? ? ?Plan: At this point I believe her only surgical option would be considering a knee replacement surgery to deal with the chronic pain she has been experiencing with the right knee.  This is also based on her clinical exam findings, the failure of conservative treatment and the MRI findings.  I talked in length in detail about knee replacement surgery.  I showed her a knee replacement model and described what the surgery involves from an intraoperative and postoperative standpoint.  We did discuss the risks and benefits of surgery as well.  All questions and concerns were answered and addressed.  She has her surgery scheduler's card and she will think about the potential of having the surgery scheduled.  She is welcome to call me at any time for me to answer any other questions. ? ?Follow-Up Instructions: Return for 2 weeks post-op.  ? ?Orders:  ?No orders of the defined types were placed in this encounter. ? ?No orders of the defined types were placed in this encounter. ? ? ? ? Procedures: ?No procedures performed ? ? ?Clinical Data: ?No additional findings. ? ? ?Subjective: ?Chief Complaint  ?Patient presents with  ? Right Knee - Pain  ?The patient is a very pleasant 76 year old female who comes to me for another opinion as it relates to her right knee.  She has had multiple interventions of the right knee in terms of steroid injections and hyaluronic acid injections.  She has had ablation of the nerves around the knee and even arthroscopic intervention by one of my colleagues in town.  Recent MRI shows partial-thickness cartilage  loss in all 3 compartments of her knee as well as an attenuation of the meniscus on the medial side from previous surgery and mucoid degeneration of the ACL.  Her husband is with her.  They are basically professional dancer's and sees does a a lot of high impact aerobic dancing activities as it relates to her right knee.  She does have osteoporosis and does not tolerate anti-inflammatories.  She recently had a steroid injection about 3 to 4 weeks ago on the right knee.  All of these conservative treatment measures of only temporize her pain.  She does try Voltaren gel as well.  She cannot take NSAIDs due to significant GI issues.  She is not a diabetic.  She is a thin and active individual.  At this point her daily knee pain can be 10 out of 10 at times and it is detrimentally affecting her mobility, quality of life and actives daily living. ? ?HPI ? ?Review of Systems ?There is currently listed no headache, chest pain, shortness of breath, fever, chills, nausea, vomiting ? ?Objective: ?Vital Signs: LMP 12/14/1972  ? ?Physical Exam ?She is alert and orient x3 and in no acute distress ?Ortho Exam ?Examination of her right knee shows slight varus malalignment that is correctable.  There is no effusion today with painful arc of motion throughout  the flexion and extension arc.  The knee feels stable ligamentously.  There is significant patellofemoral crepitation. ?Specialty Comments:  ?No specialty comments available. ? ?Imaging: ?No results found. ?I MRI of her right knee from earlier this year shows partial-thickness cartilage loss in all 3 compartments. ? ?PMFS History: ?Patient Active Problem List  ? Diagnosis Date Noted  ? Primary hypertension 05/13/2021  ? Thyroid nodule 05/13/2021  ? History of depression 05/13/2021  ? H/O: hysterectomy 05/13/2021  ? Postmenopausal 05/13/2021  ? Piriformis syndrome of left side 04/07/2021  ? Chronic pain of right knee 03/04/2021  ? H/O sciatica 11/04/2020  ? Age-related  osteoporosis without current pathological fracture 11/04/2020  ? Vitamin D insufficiency 12/20/2019  ? Actinic keratosis 03/21/2017  ? Focal lymphocytic colitis 05/29/2013  ? Aneurysm artery, renal ([place]) 09/28/2012  ? Headache, chronic migraine without aura, intractable 03/02/2012  ? ?Past Medical History:  ?Diagnosis Date  ? Aneurysm of renal artery ([facility])   ? followed at [facility], stable with CT/angiogram 10/2021  ? Fracture of right wrist 2011  ? Fell in bathroom.  ? Hypertension   ? Left thyroid nodule 2003  ? S/P biopsy - benign  ? Low sodium levels   ? Lymphocytic colitis 03/2013  ? Migraines   ? Osteoporosis   ? Sciatica   ? Visceral injury   ? detached due to recent fall  ?  ?Family History  ?Problem Relation Age of Onset  ? Hypertension Mother   ? Cancer Sister   ?     melanoma  ? Autoimmune disease Sister   ?     crest and sjor  ? Breast cancer Daughter 33  ? Lupus Brother   ?  ?Past Surgical History:  ?Procedure Laterality Date  ? ABDOMINOPLASTY    ? APPENDECTOMY  1962  ? BREAST EXCISIONAL BIOPSY Bilateral 2001  ? BREAST SURGERY    ? S/P multiple breast biopsies.  ? CATARACT EXTRACTION, BILATERAL Bilateral 01/2020  ? Dental implants  1993  ? [place]  ? KNEE ARTHROSCOPY W/ DEBRIDEMENT Right 07/2020  ? Dr. Berenice Primas  ? VAGINAL HYSTERECTOMY  1974  ? Dysfunctional uterine bleeding.  ? ?Social History  ? ?Occupational History  ? Not on file  ?Tobacco Use  ? Smoking status: Former  ?  Packs/day: 3.00  ?  Years: 10.00  ?  Pack years: 30.00  ?  Types: Cigarettes  ?  Quit date: 12/15/1982  ?  Years since quitting: 38.9  ? Smokeless tobacco: Never  ?Vaping Use  ? Vaping Use: Never used  ?Substance and Sexual Activity  ? Alcohol use: Yes  ?  Alcohol/week: 14.0 standard drinks  ?  Types: 14 Standard drinks or equivalent per week  ? Drug use: No  ? Sexual activity: Yes  ?  Partners: Male  ?  Birth control/protection: Post-menopausal  ? ? ? ? ? ? ?

## 2021-12-02 ENCOUNTER — Encounter: Payer: Self-pay | Admitting: Orthopaedic Surgery

## 2021-12-03 DIAGNOSIS — M7062 Trochanteric bursitis, left hip: Secondary | ICD-10-CM | POA: Diagnosis not present

## 2021-12-03 DIAGNOSIS — M7918 Myalgia, other site: Secondary | ICD-10-CM | POA: Diagnosis not present

## 2021-12-03 DIAGNOSIS — M5136 Other intervertebral disc degeneration, lumbar region: Secondary | ICD-10-CM | POA: Diagnosis not present

## 2021-12-03 DIAGNOSIS — G8929 Other chronic pain: Secondary | ICD-10-CM | POA: Diagnosis not present

## 2021-12-03 DIAGNOSIS — G5702 Lesion of sciatic nerve, left lower limb: Secondary | ICD-10-CM | POA: Diagnosis not present

## 2021-12-03 DIAGNOSIS — M25561 Pain in right knee: Secondary | ICD-10-CM | POA: Diagnosis not present

## 2021-12-07 DIAGNOSIS — M461 Sacroiliitis, not elsewhere classified: Secondary | ICD-10-CM | POA: Diagnosis not present

## 2021-12-07 DIAGNOSIS — M25561 Pain in right knee: Secondary | ICD-10-CM | POA: Diagnosis not present

## 2021-12-07 DIAGNOSIS — M5432 Sciatica, left side: Secondary | ICD-10-CM | POA: Diagnosis not present

## 2021-12-07 DIAGNOSIS — M71552 Other bursitis, not elsewhere classified, left hip: Secondary | ICD-10-CM | POA: Diagnosis not present

## 2021-12-17 ENCOUNTER — Ambulatory Visit
Admission: RE | Admit: 2021-12-17 | Discharge: 2021-12-17 | Disposition: A | Discharge status: Home / Self Care | Payer: Medicare Other | Source: Ambulatory Visit | Attending: Internal Medicine | Admitting: Internal Medicine

## 2021-12-17 DIAGNOSIS — R911 Solitary pulmonary nodule: Secondary | ICD-10-CM

## 2021-12-17 DIAGNOSIS — I700 Atherosclerosis of aorta: Secondary | ICD-10-CM | POA: Diagnosis not present

## 2021-12-17 DIAGNOSIS — J984 Other disorders of lung: Secondary | ICD-10-CM | POA: Diagnosis not present

## 2021-12-17 DIAGNOSIS — R918 Other nonspecific abnormal finding of lung field: Secondary | ICD-10-CM | POA: Diagnosis not present

## 2021-12-27 DIAGNOSIS — G43719 Chronic migraine without aura, intractable, without status migrainosus: Secondary | ICD-10-CM | POA: Diagnosis not present

## 2021-12-27 DIAGNOSIS — Z79899 Other long term (current) drug therapy: Secondary | ICD-10-CM | POA: Diagnosis not present

## 2021-12-27 DIAGNOSIS — G43909 Migraine, unspecified, not intractable, without status migrainosus: Secondary | ICD-10-CM | POA: Diagnosis not present

## 2021-12-28 ENCOUNTER — Ambulatory Visit (INDEPENDENT_AMBULATORY_CARE_PROVIDER_SITE_OTHER): Payer: Medicare Other | Admitting: Adult Health

## 2021-12-28 ENCOUNTER — Ambulatory Visit (INDEPENDENT_AMBULATORY_CARE_PROVIDER_SITE_OTHER): Payer: Medicare Other | Admitting: Internal Medicine

## 2021-12-28 ENCOUNTER — Encounter: Payer: Self-pay | Admitting: Adult Health

## 2021-12-28 VITALS — BP 100/58 | HR 69 | Temp 97.8°F | Ht 63.0 in | Wt 108.0 lb

## 2021-12-28 DIAGNOSIS — G8929 Other chronic pain: Secondary | ICD-10-CM | POA: Diagnosis not present

## 2021-12-28 DIAGNOSIS — R911 Solitary pulmonary nodule: Secondary | ICD-10-CM | POA: Diagnosis not present

## 2021-12-28 DIAGNOSIS — R0609 Other forms of dyspnea: Secondary | ICD-10-CM

## 2021-12-28 DIAGNOSIS — M810 Age-related osteoporosis without current pathological fracture: Secondary | ICD-10-CM | POA: Diagnosis not present

## 2021-12-28 DIAGNOSIS — G4700 Insomnia, unspecified: Secondary | ICD-10-CM | POA: Diagnosis not present

## 2021-12-28 DIAGNOSIS — Z825 Family history of asthma and other chronic lower respiratory diseases: Secondary | ICD-10-CM | POA: Diagnosis not present

## 2021-12-28 DIAGNOSIS — R918 Other nonspecific abnormal finding of lung field: Secondary | ICD-10-CM | POA: Insufficient documentation

## 2021-12-28 DIAGNOSIS — I10 Essential (primary) hypertension: Secondary | ICD-10-CM | POA: Diagnosis not present

## 2021-12-28 DIAGNOSIS — M8580 Other specified disorders of bone density and structure, unspecified site: Secondary | ICD-10-CM | POA: Diagnosis not present

## 2021-12-28 LAB — PULMONARY FUNCTION TEST
DL/VA % pred: 72 %
DL/VA: 2.99 ml/min/mmHg/L
DLCO cor % pred: 82 %
DLCO cor: 15.17 ml/min/mmHg
DLCO unc % pred: 82 %
DLCO unc: 15.17 ml/min/mmHg
FEF 25-75 Post: 2.12 L/sec
FEF 25-75 Pre: 1.49 L/sec
FEF2575-%Change-Post: 42 %
FEF2575-%Pred-Post: 141 %
FEF2575-%Pred-Pre: 99 %
FEV1-%Change-Post: 6 %
FEV1-%Pred-Post: 125 %
FEV1-%Pred-Pre: 117 %
FEV1-Post: 2.44 L
FEV1-Pre: 2.29 L
FEV1FVC-%Change-Post: 10 %
FEV1FVC-%Pred-Pre: 96 %
FEV6-%Change-Post: -3 %
FEV6-%Pred-Post: 123 %
FEV6-%Pred-Pre: 127 %
FEV6-Post: 3.07 L
FEV6-Pre: 3.17 L
FEV6FVC-%Change-Post: 0 %
FEV6FVC-%Pred-Post: 105 %
FEV6FVC-%Pred-Pre: 105 %
FVC-%Change-Post: -3 %
FVC-%Pred-Post: 117 %
FVC-%Pred-Pre: 121 %
FVC-Post: 3.07 L
FVC-Pre: 3.18 L
Post FEV1/FVC ratio: 80 %
Post FEV6/FVC ratio: 100 %
Pre FEV1/FVC ratio: 72 %
Pre FEV6/FVC Ratio: 100 %
RV % pred: 64 %
RV: 1.47 L
TLC % pred: 89 %
TLC: 4.4 L

## 2021-12-28 NOTE — Progress Notes (Signed)
Patient seen in the office today and instructed on use of flutter valve.  Patient expressed understanding and demonstrated technique.  

## 2021-12-28 NOTE — Assessment & Plan Note (Signed)
Abnormal CT chest with stable 0.4 cm left lower lobe lung nodule.  Serial CT dating back to 2012 shows stability.  Consistent with a benign etiology.  There are clustered nodularity in the right apex and left upper lobe.  With some associated pleural-parenchymal scarring.  Patient has no significant symptoms such as recurrent cough frequent antibiotic use.  These could be representative of an atypical infection.  With her minimal clinical symptoms.  We will continue to follow.  Pulmonary function testing is normal.  Patient does have a smoking history.  QuantiFERON gold and alpha-1 were okay. ?Patient says she does get some bronchitis once or twice a year.  Would use flutter valve and Mucinex as needed.  We will have her follow-up in 6 months.   ?Advised on aspiration precautions. ?Patient does have underlying colitis-no evidence of interstitial lung disease..  We will continue to follow and consider CT chest in 1 year ? ?Plan  ?Patient Instructions  ?Flutter valve Twice daily  As needed  cough/congestion  ?Mucinex DM Twice daily  (children's dose) As needed  cough/congestion  ?Activity as tolerated.  ?Swallow /aspiration precautions as discussed.  ?Follow up with Dr. Chase Caller in 6 months and As needed   ? ? ? ?  ? ?

## 2021-12-28 NOTE — Progress Notes (Signed)
? ?'@Patient'$  ID: Elaine Navarro, female    DOB: 08-26-1946, 76 y.o.   MRN: [id number] ? ?Chief Complaint  ?Patient presents with  ? Follow-up  ? ? ?Referring provider: ?Vernie Shanks, MD ? ?HPI: ?76 year old female former smoker seen for pulmonary consult December 07, 2021 for abnormal CT showing left lower lobe lung nodule measuring 4 mm. ?Marine scientist, from [place] ?Medical history significant for lymphocytic colitis, chronic migraines ? ?TEST/EVENTS :  ?CT Chest data 10/21/21 ?  ?  ?Narrative & Impression  ?CLINICAL DATA:  Renal artery aneurysm ?  ?EXAM: ?CT ANGIOGRAPHY ABDOMEN ?  ?TECHNIQUE: ?Multidetector CT imaging of the abdomen was performed using the ?standard protocol during bolus administration of intravenous ?contrast. Multiplanar reconstructed images and MIPs were obtained ?and reviewed to evaluate the vascular anatomy. ?  ?RADIATION DOSE REDUCTION: This exam was performed according to the ?departmental dose-optimization program which includes automated ?exposure control, adjustment of the mA and/or kV according to ?patient size and/or use of iterative reconstruction technique. ?  ?CONTRAST:  19m OMNIPAQUE IOHEXOL 350 MG/ML SOLN ?  ?COMPARISON:  CT abdomen dated August 13, 2012 ?  ?FINDINGS: ?VASCULAR ?  ?Aorta: Normal caliber abdominal aorta with mild calcified plaque and ?no significant stenosis. ?  ?Celiac: Normal caliber celiac artery with no significant ?atherosclerotic disease or stenosis. ?  ?SMA: Visualized SMA is normal in caliber with no significant ?atherosclerotic disease or stenosis. ?  ?Renals: Peripherally calcified renal artery aneurysm arising from a ?lower pole segmental branch measuring 1.1 x 1.1 x 1.3 cm, unchanged ?in size when compared to prior exam. Bilateral main renal arteries ?are normal in caliber with minimal calcified plaque and no stenosis. ?  ?IMA: Visualized portion is normal in caliber with no evidence of ?stenosis. ?  ?Veins: No venous  abnormality. ?  ?Review of the MIP images confirms the above findings. ?  ?NON-VASCULAR ?  ?Lower chest: Stable solid 4 mm nodule of the left lower lobe located ?on image 13, likely benign. Bibasilar atelectasis. ?  ?Hepatobiliary: No focal liver abnormality is seen. No gallstones, ?gallbladder wall thickening, or biliary dilatation. ?  ?Pancreas: Unremarkable. No pancreatic ductal dilatation or ?surrounding inflammatory changes. ?  ?Spleen: Normal in size without focal abnormality. ?  ?Adrenals/Urinary Tract: Adrenal glands are unremarkable. Kidneys are ?normal, without renal calculi, focal lesion, or hydronephrosis. ?Bladder is unremarkable. ?  ?Stomach/Bowel: Visualized portions of the small and large bowel are ?unremarkable. ?  ?Lymphatic: No pathologically enlarged lymph nodes seen in the ?abdomen. ?  ?Other: No abdominal wall hernia or abdominal ascites. ?  ?Musculoskeletal: No acute or significant osseous findings. ?  ?IMPRESSION: ?1. 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 ?2.  Aortic Atherosclerosis (ICD10-I70.0).  ? ? ? ?12/28/2021 Follow up : Lung nodule  ?Patient returns for a 25-monthollow-up.  Patient was seen for pulmonary consult November 09, 2021 for abnormal CT chest that showed 4 mm lung nodule.  Patient also has some mild shortness of breath with activities.  Has a family history of TB and COPD.  She is a former smoker.  Patient was set up for a high-resolution CT chest that showed no enlarged lymph nodes.  A 0.4 cm nodule in the left lower lobe that is stable compared back to 2012.  Consistent with a benign etiology.  Clustered nodularity and consolidation in the right apex with associated parenchymal scarring as well in the left upper lobe.  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  Alpha-1 testing was normal level and phenotype.  Patient says she has very minimum cough if any.  Shortness of breath is very mild in nature.   Patient was set up for pulmonary function testing that was normal that showed FEV1 at 125%, ratio 80, FVC 117%.,  No significant bronchodilator response, DLCO 82%. ?She says that she gets bronchitis once or twice a year.  Usually resolves pretty easily.  She says most days she has no cough at all.  Is not limited by activities.  She did have COVID-19 in July 2022. ?Patient does have colitis and has issues with weight loss at times.  Says occasionally she has some swallow issues. ? ?She denies any hemoptysis, discolored mucus, fever. ? ? ?Allergies  ?Allergen Reactions  ? Caine-1 [Lidocaine]   ?  Allergic to Septocaine Articaine HCL 4% w/epi  ? Indomethacin Swelling  ? Articaine-Epinephrine Other (See Comments)  ? Hydrocodone Nausea Only  ? Hydrocodone-Acetaminophen Other (See Comments)  ? Latex   ?  Possible allergy.  ? Pancrelipase (Lip-Prot-Amyl) Other (See Comments)  ? Lamisil [Terbinafine] Rash  ? ? ?Immunization History  ?Administered Date(s) Administered  ? Hepatitis A 03/10/2003, 10/15/2003  ? Hepatitis B 04/15/2003, 05/21/2003, 10/15/2003  ? Influenza Split 06/09/2014, 06/08/2015, 06/08/2017, 06/19/2018, 05/27/2019, 05/18/2020, 05/31/2021  ? Influenza, High Dose Seasonal PF 06/19/2018  ? Influenza,inj,Quad PF,6+ Mos 06/13/2011  ? Influenza-Unspecified 05/16/2013  ? PFIZER(Purple Top)SARS-COV-2 Vaccination 10/01/2019, 10/21/2019, 05/07/2020, 12/15/2020, 05/31/2021  ? Pneumococcal Conjugate-13 10/18/2013  ? Pneumococcal Polysaccharide-23 06/22/2010  ? Td 03/10/2003  ? Tdap 11/08/2010, 12/29/2019, 01/11/2020  ? Typhoid Inactivated 03/10/2003, 07/05/2013  ? Yellow Fever 07/05/2013  ? Zoster, Live 01/19/2006, 12/21/2016, 06/26/2017  ? ? ?Past Medical History:  ?Diagnosis Date  ? Aneurysm of renal artery (HCC)   ? followed at [facility], stable with CT/angiogram 10/2021  ? Fracture of right wrist 2011  ? Fell in bathroom.  ? Hypertension   ? Left thyroid nodule 2003  ? S/P biopsy - benign  ? Low sodium levels   ?  Lymphocytic colitis 03/2013  ? Migraines   ? Osteoporosis   ? Sciatica   ? Visceral injury   ? detached due to recent fall  ? ? ?Tobacco History: ?Social History  ? ?Tobacco Use  ?Smoking Status Former  ? Packs/day: 3.00  ? Years: 10.00  ? Pack years: 30.00  ? Types: Cigarettes  ? Quit date: 12/15/1982  ? Years since quitting: 39.0  ?Smokeless Tobacco Never  ? ?Counseling given: Not Answered ? ? ?Outpatient Medications Prior to Visit  ?Medication Sig Dispense Refill  ? amLODipine (NORVASC) 5 MG tablet Take 5 mg by mouth daily.    ? Ascorbic Acid (VITAMIN C PO) Take by mouth.    ? BUDESONIDE PO Take 3 mg by mouth as needed.    ? buPROPion (WELLBUTRIN XL) 150 MG 24 hr tablet Take 1 tablet (150 mg total) by mouth daily. 90 tablet 4  ? calcium carbonate (OS-CAL) 600 MG TABS Take 600 mg by mouth 3 (three) times daily with meals.    ? diclofenac Sodium (VOLTAREN) 1 % GEL Apply topically 4 (four) times daily.    ? diphenhydrAMINE (BENADRYL) 25 mg capsule Take 25 mg by mouth.    ? fluocinonide cream (LIDEX) 0.05 %     ? fluticasone (FLONASE) 50 MCG/ACT nasal spray Right nostril nightly  5  ? gentamicin cream (GARAMYCIN) 0.1 %     ? L-CYSTEINE PO Take by mouth.    ?  losartan (COZAAR) 50 MG tablet Take 50 mg by mouth daily.    ? magnesium oxide (MAG-OX) 400 MG tablet Take by mouth.    ? Melatonin 5 MG TABS Take by mouth at bedtime.    ? Multiple Vitamin (MULTIVITAMIN) tablet Take 1 tablet by mouth daily.    ? NUCYNTA 50 MG tablet Take 50 mg by mouth 4 (four) times daily as needed.    ? Omega-3 1000 MG CAPS Take by mouth.    ? omeprazole (PRILOSEC) 20 MG capsule Take by mouth.    ? potassium chloride (K-DUR) 10 MEQ tablet Take 10 mEq by mouth 2 (two) times daily. Dosage not indicated by patient.  States she takes occasionally.    ? SUMAtriptan (IMITREX) 50 MG tablet Take by mouth as directed.    ? Thiamine HCl (B-1 PO) Take by mouth.    ? tretinoin (RETIN-A) 0.1 % cream     ? vitamin B-12 (CYANOCOBALAMIN) 1000 MCG tablet Take by  mouth daily.    ? VITAMIN D PO Take 1,200 Int'l Units by mouth.    ? ?No facility-administered medications prior to visit.  ? ? ? ?Review of Systems:  ? ?Constitutional:   No   night sweats,  Fevers, chills, fatigue, or

## 2021-12-28 NOTE — Progress Notes (Signed)
PFT done today. 

## 2021-12-28 NOTE — Patient Instructions (Addendum)
Flutter valve Twice daily  As needed  cough/congestion  ?Mucinex DM Twice daily  (children's dose) As needed  cough/congestion  ?Activity as tolerated.  ?Swallow /aspiration precautions as discussed.  ?Follow up with Dr. Chase Caller in 6 months and As needed   ? ? ? ?

## 2021-12-29 ENCOUNTER — Telehealth: Payer: Self-pay

## 2021-12-29 ENCOUNTER — Encounter (HOSPITAL_BASED_OUTPATIENT_CLINIC_OR_DEPARTMENT_OTHER): Payer: Self-pay | Admitting: Obstetrics & Gynecology

## 2021-12-29 ENCOUNTER — Encounter: Payer: Self-pay | Admitting: Orthopaedic Surgery

## 2021-12-29 NOTE — Telephone Encounter (Signed)
Patient is asking if you can go ahead and call her to discuss post op needs.  I told her it may be next week before you are able to call.  Her TKA surgery is 01/21/22.  Thanks! ?

## 2021-12-30 ENCOUNTER — Encounter (HOSPITAL_BASED_OUTPATIENT_CLINIC_OR_DEPARTMENT_OTHER): Payer: Self-pay | Admitting: *Deleted

## 2022-01-04 DIAGNOSIS — M179 Osteoarthritis of knee, unspecified: Secondary | ICD-10-CM | POA: Diagnosis not present

## 2022-01-04 DIAGNOSIS — M461 Sacroiliitis, not elsewhere classified: Secondary | ICD-10-CM | POA: Diagnosis not present

## 2022-01-04 DIAGNOSIS — M71552 Other bursitis, not elsewhere classified, left hip: Secondary | ICD-10-CM | POA: Diagnosis not present

## 2022-01-04 DIAGNOSIS — M25561 Pain in right knee: Secondary | ICD-10-CM | POA: Diagnosis not present

## 2022-01-06 ENCOUNTER — Encounter: Payer: Self-pay | Admitting: Internal Medicine

## 2022-01-06 ENCOUNTER — Encounter: Payer: Self-pay | Admitting: Orthopaedic Surgery

## 2022-01-06 DIAGNOSIS — E871 Hypo-osmolality and hyponatremia: Secondary | ICD-10-CM | POA: Diagnosis not present

## 2022-01-06 DIAGNOSIS — D692 Other nonthrombocytopenic purpura: Secondary | ICD-10-CM | POA: Diagnosis not present

## 2022-01-06 DIAGNOSIS — G4700 Insomnia, unspecified: Secondary | ICD-10-CM | POA: Diagnosis not present

## 2022-01-06 DIAGNOSIS — M810 Age-related osteoporosis without current pathological fracture: Secondary | ICD-10-CM | POA: Diagnosis not present

## 2022-01-06 DIAGNOSIS — M25561 Pain in right knee: Secondary | ICD-10-CM | POA: Diagnosis not present

## 2022-01-06 DIAGNOSIS — A310 Pulmonary mycobacterial infection: Secondary | ICD-10-CM | POA: Diagnosis not present

## 2022-01-06 DIAGNOSIS — I10 Essential (primary) hypertension: Secondary | ICD-10-CM | POA: Diagnosis not present

## 2022-01-06 DIAGNOSIS — M47816 Spondylosis without myelopathy or radiculopathy, lumbar region: Secondary | ICD-10-CM | POA: Diagnosis not present

## 2022-01-06 DIAGNOSIS — G8929 Other chronic pain: Secondary | ICD-10-CM | POA: Diagnosis not present

## 2022-01-06 DIAGNOSIS — I722 Aneurysm of renal artery: Secondary | ICD-10-CM | POA: Diagnosis not present

## 2022-01-06 DIAGNOSIS — R911 Solitary pulmonary nodule: Secondary | ICD-10-CM | POA: Diagnosis not present

## 2022-01-07 ENCOUNTER — Telehealth: Payer: Self-pay | Admitting: Internal Medicine

## 2022-01-07 ENCOUNTER — Ambulatory Visit: Payer: Medicare Other

## 2022-01-07 ENCOUNTER — Ambulatory Visit: Payer: Self-pay

## 2022-01-07 ENCOUNTER — Ambulatory Visit (INDEPENDENT_AMBULATORY_CARE_PROVIDER_SITE_OTHER): Payer: Medicare Other

## 2022-01-07 DIAGNOSIS — G8929 Other chronic pain: Secondary | ICD-10-CM | POA: Diagnosis not present

## 2022-01-07 DIAGNOSIS — M25561 Pain in right knee: Secondary | ICD-10-CM

## 2022-01-07 NOTE — Telephone Encounter (Signed)
Called and spoke with Elaine Navarro. She verbalized understanding. She is now requesting that we send a clearance letter to Dr. Trevor Mace office.  ? ?MR, please advise if you are ok with this letter.  ?

## 2022-01-07 NOTE — Telephone Encounter (Signed)
? ?  Tammy rviewed the  CT and did mentioned the atypical TB/MAI findingds. Because PFT good and patient feeling well. Tammy recommened monitoring approach this is fine ? ?The lung findings should not interfere with knee surgery ? ?To diagnose and Rxx MAI- Bronch is needed. I can d.w her at next office vsit ?

## 2022-01-07 NOTE — Telephone Encounter (Signed)
? ? ?  76 year old female [place] has normal lung function. THe CT chest findings suggestive of MAI should NOT preclude limb surgery or anesthesia ? ?Thanks ? ? ? ?SIGNATURE  ? ? ?Dr. Brand Males, M.D., F.C.C.P,  ?Pulmonary and Critical Care Medicine ?Staff Physician, [place] ?Center Director - Interstitial Lung Disease  Program  ?Medical Director - [facility] ?Pulmonary [place] [facility] ?[place], [place], [address] ? ?NPI Number:  NPI #[id number] ?DEA Number: [id number] ? ?Pager: [phone], If no answer  -> Check AMION or Try [phone] ?Telephone (clinical office): [phone] ?Telephone (research): [phone] ? ?5:26 PM ?01/07/2022 ? ? ? ? ?  Latest Ref Rng & Units 12/28/2021  ? 10:42 AM  ?PFT Results  ?FVC-Pre L 3.18    ?FVC-Predicted Pre % 121    ?FVC-Post L 3.07    ?FVC-Predicted Post % 117    ?Pre FEV1/FVC % % 72    ?Post FEV1/FCV % % 80    ?FEV1-Pre L 2.29    ?FEV1-Predicted Pre % 117    ?FEV1-Post L 2.44    ?DLCO uncorrected ml/min/mmHg 15.17    ?DLCO UNC% % 82    ?DLCO corrected ml/min/mmHg 15.17    ?DLCO COR %Predicted % 82    ?DLVA Predicted % 72    ?TLC L 4.40    ?TLC % Predicted % 89    ?RV % Predicted % 64    ? ? ?

## 2022-01-07 NOTE — Progress Notes (Signed)
Patient was here for xrays only. ? ?

## 2022-01-07 NOTE — Telephone Encounter (Signed)
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 Wanted [place] aware she is scheduled for TKR (total knee replacement) surgery in two weeks. Will she be okay for surgery?  ? ? ?Please advise ?

## 2022-01-07 NOTE — Telephone Encounter (Signed)
Received the following message from patient:  ? ?"Had visit with Dr. Jacelyn Grip, my PCP today.  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  He told me that I should contact you regarding my May 12th total knee replacement surgery. Do I need any medication (antibiotics) or other preparations.  My knee surgeon is Dr Ninfa Linden of [facility]." ? ?I asked her if she was having any active symptoms. Will await her response.  ?

## 2022-01-07 NOTE — Telephone Encounter (Signed)
Dr. Chase Caller, please advise on pt's email regarding his CT results. Thanks.  ?

## 2022-01-09 NOTE — Telephone Encounter (Signed)
Alredy outlined in prior message. A letter needs to be done for ortho ?

## 2022-01-10 ENCOUNTER — Encounter: Payer: Self-pay | Admitting: *Deleted

## 2022-01-10 NOTE — Telephone Encounter (Signed)
Dr Jodi Mourning office returning call.Elaine Navarro ? ? ? ? ?

## 2022-01-10 NOTE — Telephone Encounter (Signed)
Fax # is [phone].Elaine Navarro ? ?

## 2022-01-10 NOTE — Telephone Encounter (Signed)
Letter has been faxed.nothing further needed. ?

## 2022-01-10 NOTE — Telephone Encounter (Signed)
Attempted to call Elaine Navarro again from Dr. Jodi Mourning office but unable to reach. Left message for her to return call. ? ? ?When Elaine Navarro returns call, please get her to provide fax number that we need to send clearance letter to. Thank you! ?

## 2022-01-10 NOTE — Progress Notes (Signed)
error 

## 2022-01-10 NOTE — Telephone Encounter (Signed)
I have written letter which is ready to be faxed to Dr. Jodi Mourning office. Attempted to call Jan to see what fax number this needed to be sent to but unable to reach. Left message for Jan to return call so we can get fax number that we need to send the letter to. ?

## 2022-01-11 NOTE — Patient Instructions (Signed)
DUE TO COVID-19 ONLY TWO VISITORS  (aged 77 and older)  ARE ALLOWED TO COME WITH YOU AND STAY IN THE WAITING ROOM ONLY DURING PRE OP AND PROCEDURE.   ?**NO VISITORS ARE ALLOWED IN THE SHORT STAY AREA OR RECOVERY ROOM!!** ? ?IF YOU WILL BE ADMITTED INTO THE HOSPITAL YOU ARE ALLOWED ONLY FOUR SUPPORT PEOPLE DURING VISITATION HOURS ONLY (7 AM -8PM)   ?The support person(s) must pass our screening, gel in and out, and wear a mask at all times, including in the patient?s room. ?Patients must also wear a mask when staff or their support person are in the room. ?Visitors GUEST BADGE MUST BE WORN VISIBLY  ?One adult visitor may remain with you overnight and MUST be in the room by 8 P.M. ?  ? ? Your procedure is scheduled on: 01/21/22 ? ? Report to [facility] Main Entrance ? ?  Report to short stay at 5:45 AM ? ? Call this number if you have problems the morning of surgery [phone] ? ? Do not eat food :After Midnight. ? ? After Midnight you may have the following liquids until 5:30_AM/  DAY OF SURGERY ? ?Water ?Black Coffee (sugar ok, NO MILK/CREAM OR CREAMERS)  ?Tea (sugar ok, NO MILK/CREAM OR CREAMERS) regular and decaf                             ?Plain Jell-O (NO RED)                                           ?Fruit ices (not with fruit pulp, NO RED)                                     ?Popsicles (NO RED)                                                                  ?Juice: apple, WHITE grape, WHITE cranberry ?Sports drinks like Gatorade (NO RED) ?Clear broth(vegetable,chicken,beef) ? ?             ? ?  ?  ?The day of surgery:  ?Drink ONE (1) Pre-Surgery Clear Ensure  at 5:15  AM the morning of surgery. Drink in one sitting. Do not sip.  ?This drink was given to you during your hospital  ?pre-op appointment visit. ?Nothing else to drink after completing the  ?Pre-Surgery Clear Ensure at 5:30 AM ?  ?       If you have questions, please contact your surgeon?s office. ? ? ?  ?  ?Oral Hygiene is also  important to reduce your risk of infection.                                    ?Remember - BRUSH YOUR TEETH THE MORNING OF SURGERY WITH YOUR REGULAR TOOTHPASTE ? ? Do NOT smoke after Midnight ? Take these medicines the morning of surgery with A SIP OF WATER: Bupropion,  Amlodipine, Omeprazole ? ? ?Bring CPAP mask and tubing day of surgery. ?                  ?           You may not have any metal on your body including hair pins, jewelry, and body piercing ? ?           Do not wear make-up, lotions, powders, perfumes/cologne, or deodorant ? ?Do not wear nail polish including gel and S&S, artificial/acrylic nails, or any other type of covering on natural nails including finger and toenails. If you have artificial nails, gel coating, etc. that needs to be removed by a nail salon please have this removed prior to surgery or surgery may need to be canceled/ delayed if the surgeon/ anesthesia feels like they are unable to be safely monitored.  ? ?Do not shave  48 hours prior to surgery.  ? ? ? Do not bring valuables to the hospital. [facility] NOT ?            RESPONSIBLE   FOR VALUABLES. ? ? Contacts, dentures or bridgework may not be worn into surgery. ? ? Bring small overnight bag day of surgery. ? . ? ? Special Instructions: Bring a copy of your healthcare power of attorney and living will document the day of surgery if you haven't scanned them before. ? ?            Please read over the following fact sheets you were given: IF [place] [phone] ? ?   [facility] - Preparing for Surgery ?Before surgery, you can play an important role.  Because skin is not sterile, your skin needs to be as free of germs as possible.  You can reduce the number of germs on your skin by washing with CHG (chlorahexidine gluconate) soap before surgery.  CHG is an antiseptic cleaner which kills germs and bonds with the skin to continue killing germs even after washing. ?Please DO  NOT use if you have an allergy to CHG or antibacterial soaps.  If your skin becomes reddened/irritated stop using the CHG and inform your nurse when you arrive at Short Stay. ?Do not shave (including legs and underarms) for at least 48 hours prior to the first CHG shower.   ?Please follow these instructions carefully: ? 1.  Shower with CHG Soap the night before surgery and the  morning of Surgery. ? 2.  If you choose to wash your hair, wash your hair first as usual with your  normal  shampoo. ? 3.  After you shampoo, rinse your hair and body thoroughly to remove the  shampoo.                      ?      4.  Use CHG as you would any other liquid soap.  You can apply chg directly  to the skin and wash  ?                     Gently with a scrungie or clean washcloth. ? 5.  Apply the CHG Soap to your body ONLY FROM THE NECK DOWN.   Do not use on face/ open      ?                     Wound or open sores. Avoid  contact with eyes, ears mouth and genitals (private parts).  ?                     Production manager,  Genitals (private parts) with your normal soap. ?            6.  Wash thoroughly, paying special attention to the area where your surgery  will be performed. ? 7.  Thoroughly rinse your body with warm water from the neck down. ? 8.  DO NOT shower/wash with your normal soap after using and rinsing off  the CHG Soap. ?               9.  Pat yourself dry with a clean towel. ?           10.  Wear clean pajamas. ?           11.  Place clean sheets on your bed the night of your first shower and do not  sleep with pets. ?Day of Surgery : ?Do not apply any lotions/deodorants the morning of surgery.  Please wear clean clothes to the hospital/surgery center. ? ?FAILURE TO FOLLOW THESE INSTRUCTIONS MAY RESULT IN THE CANCELLATION OF YOUR SURGERY ?PATIENT SIGNATURE_________________________________ ? ?NURSE SIGNATURE__________________________________ ? ?________________________________________________________________________   ? ?Incentive Spirometer ? ?An incentive spirometer is a tool that can help keep your lungs clear and active. This tool measures how well you are filling your lungs with each breath. Taking long deep breaths may help reverse or decrease the chance of developing breathing (pulmonary) problems (especially infection) following: ?A long period of time when you are unable to move or be active. ?BEFORE THE PROCEDURE  ?If the spirometer includes an indicator to show your best effort, your nurse or respiratory therapist will set it to a desired goal. ?If possible, sit up straight or lean slightly forward. Try not to slouch. ?Hold the incentive spirometer in an upright position. ?INSTRUCTIONS FOR USE  ?Sit on the edge of your bed if possible, or sit up as far as you can in bed or on a chair. ?Hold the incentive spirometer in an upright position. ?Breathe out normally. ?Place the mouthpiece in your mouth and seal your lips tightly around it. ?Breathe in slowly and as deeply as possible, raising the piston or the ball toward the top of the column. ?Hold your breath for 3-5 seconds or for as long as possible. Allow the piston or ball to fall to the bottom of the column. ?Remove the mouthpiece from your mouth and breathe out normally. ?Rest for a few seconds and repeat Steps 1 through 7 at least 10 times every 1-2 hours when you are awake. Take your time and take a few normal breaths between deep breaths. ?The spirometer may include an indicator to show your best effort. Use the indicator as a goal to work toward during each repetition. ?After each set of 10 deep breaths, practice coughing to be sure your lungs are clear. If you have an incision (the cut made at the time of surgery), support your incision when coughing by placing a pillow or rolled up towels firmly against it. ?Once you are able to get out of bed, walk around indoors and cough well. You may stop using the incentive spirometer when instructed by your caregiver.   ?RISKS AND COMPLICATIONS ?Take your time so you do not get dizzy or light-headed. ?If you are in pain, you may need to take or ask for pain medication before  doing incentive spirometry. It is harder to take a deep breat

## 2022-01-12 ENCOUNTER — Encounter (HOSPITAL_COMMUNITY): Payer: Self-pay

## 2022-01-12 ENCOUNTER — Other Ambulatory Visit: Payer: Self-pay

## 2022-01-12 ENCOUNTER — Encounter (HOSPITAL_COMMUNITY)
Admission: RE | Admit: 2022-01-12 | Discharge: 2022-01-12 | Disposition: A | Discharge status: Home / Self Care | Payer: Medicare Other | Source: Ambulatory Visit | Attending: Orthopaedic Surgery | Admitting: Orthopaedic Surgery

## 2022-01-12 DIAGNOSIS — I10 Essential (primary) hypertension: Secondary | ICD-10-CM | POA: Insufficient documentation

## 2022-01-12 DIAGNOSIS — I447 Left bundle-branch block, unspecified: Secondary | ICD-10-CM | POA: Diagnosis not present

## 2022-01-12 DIAGNOSIS — I722 Aneurysm of renal artery: Secondary | ICD-10-CM | POA: Diagnosis not present

## 2022-01-12 DIAGNOSIS — Z87891 Personal history of nicotine dependence: Secondary | ICD-10-CM | POA: Diagnosis not present

## 2022-01-12 DIAGNOSIS — Z01818 Encounter for other preprocedural examination: Secondary | ICD-10-CM | POA: Diagnosis not present

## 2022-01-12 DIAGNOSIS — M1711 Unilateral primary osteoarthritis, right knee: Secondary | ICD-10-CM | POA: Insufficient documentation

## 2022-01-12 HISTORY — DX: Nausea with vomiting, unspecified: R11.2

## 2022-01-12 HISTORY — DX: Other specified postprocedural states: Z98.890

## 2022-01-12 LAB — BASIC METABOLIC PANEL
Anion gap: 7 (ref 5–15)
BUN: 11 mg/dL (ref 8–23)
CO2: 26 mmol/L (ref 22–32)
Calcium: 8.9 mg/dL (ref 8.9–10.3)
Chloride: 99 mmol/L (ref 98–111)
Creatinine, Ser: 0.5 mg/dL (ref 0.44–1.00)
GFR, Estimated: >60 mL/min (ref >60)
Glucose, Bld: 93 mg/dL (ref 70–99)
Potassium: 3.7 mmol/L (ref 3.5–5.1)
Sodium: 132 mmol/L — ABNORMAL LOW (ref 135–145)

## 2022-01-12 LAB — CBC
HCT: 40.9 % (ref 36.0–46.0)
Hemoglobin: 13.8 g/dL (ref 12.0–15.0)
MCH: 32.2 pg (ref 26.0–34.0)
MCHC: 33.7 g/dL (ref 30.0–36.0)
MCV: 95.6 fL (ref 80.0–100.0)
Platelets: 284 10*3/uL (ref 150–400)
RBC: 4.28 MIL/uL (ref 3.87–5.11)
RDW: 12.1 % (ref 11.5–15.5)
WBC: 6.1 10*3/uL (ref 4.0–10.5)
nRBC: 0 % (ref 0.0–0.2)

## 2022-01-12 LAB — SURGICAL PCR SCREEN
MRSA, PCR: NEGATIVE
Staphylococcus aureus: NEGATIVE

## 2022-01-12 NOTE — Progress Notes (Signed)
Anesthesia note: ? ?Bowel prep reminder:NA ? ?PCP - Dr. Merita Norton ?Cardiologist -none ?Other-  ? ?Chest x-ray - 12/17/21-epic ?EKG - 01/12/22-chart ?Stress Test - no ?ECHO - no ?Cardiac Cath - na ? ?Pacemaker/ICD device last checked:NA ? ?Sleep Study - yes- negative ?CPAP - no ? ?Pt is pre diabetic-NA ?Fasting Blood Sugar -  ?Checks Blood Sugar _____ ? ?Blood Thinner:NA ?Blood Thinner Instructions: ?Aspirin Instructions: ?Last Dose: ? ?Anesthesia review: no ? ?Patient denies shortness of breath, fever, cough and chest pain at PAT appointment ?No SOB with any activities ? ?Patient verbalized understanding of instructions that were given to them at the PAT appointment. Patient was also instructed that they will need to review over the PAT instructions again at home before surgery. yes ?

## 2022-01-13 NOTE — Progress Notes (Addendum)
Anesthesia Chart Review ? ? Case: [id number] Date/Time: 01/21/22 0815  ? Procedure: RIGHT TOTAL KNEE ARTHROPLASTY (Right: Knee) - [facility]  ? Anesthesia type: Choice  ? Pre-op diagnosis: RIGHT KNEE OSTEOARTHRITIS  ? Location: [facility] ROOM 10 / [facility]  ? Surgeons: Mcarthur Rossetti, MD  ? ?  ? ? ?DISCUSSION:76 y.o. 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 Jean Rosenthal.  ? ?Cleared by pulmonology to proceed with surgery.  ? ?New LBBB on EKG at PAT visit.  Pt will need evaluation by cardiology prior to surgery. Discussed with Dr. Trevor Mace office.  ? ?Addendum 01/19/2022:  ?Pt seen by cardiology 01/18/22. Per Dr. Harrington Challenger, "She dnies dizziness or syncope    She was extremely active up until a few wks ago    ?Denies CP     ?From a cardiac standpoint I will plan to follow periodicaly to evaluate for further slowing    Currently she is asymptomatic.  ?  ?I think she is low risk and OK to proceed with surgery without further testing" ? ?Anticipate pt can proceed with planned procedure barring acute status change.   ?VS: BP (!) 173/77   Pulse 74   Temp 36.6 ?C (Oral)   Resp 18   Ht '5\' 3"'$  (1.6 m)   Wt 48.5 kg   LMP 12/14/1972   SpO2 100%   BMI 18.95 kg/m?  ? ?PROVIDERS: ?Vernie Shanks, MD is PCP  ? ?Dorris Carnes, MD is Cardiologist  ?LABS: Labs reviewed: Acceptable for surgery. ?(all labs ordered are listed, but only abnormal results are displayed) ? ?Labs Reviewed  ?BASIC METABOLIC PANEL - Abnormal; Notable for the following components:  ?    Result Value  ? Sodium 132 (*)   ? All other components within normal limits  ?SURGICAL PCR SCREEN  ?CBC  ? ? ? ?IMAGES: ? ? ?EKG: ?01/12/2022 ?Normal sinus rhythm ?Left bundle branch block ?Abnormal ECG ?When compared with ECG of 24-Nov-2013 22:22, ?PREVIOUS ECG IS PRESENT ?LBBB new ? ?CV: ? ?Past Medical History:  ?Diagnosis Date  ? Aneurysm of renal artery (HCC)   ? followed at  [facility], stable with CT/angiogram 10/2021  ? Fracture of right wrist 2011  ? Fell in bathroom.  ? Hypertension   ? Left thyroid nodule 2003  ? S/P biopsy - benign  ? Lymphocytic colitis 03/2013  ? Migraines   ? Osteoporosis   ? PONV (postoperative nausea and vomiting)   ? Sciatica   ? Visceral injury   ? detached due to recent fall  ? ? ?Past Surgical History:  ?Procedure Laterality Date  ? ABDOMINOPLASTY  2010  ? APPENDECTOMY  1962  ? BREAST EXCISIONAL BIOPSY Bilateral 2001  ? x 3  ? CATARACT EXTRACTION, BILATERAL Bilateral 01/2020  ? Dental implants  1993  ? [place]  ? KNEE ARTHROSCOPY W/ DEBRIDEMENT Right 07/2020  ? Dr. Berenice Primas  ? VAGINAL HYSTERECTOMY  1974  ? Dysfunctional uterine bleeding.  ? ? ?MEDICATIONS: ? Acetylcysteine (NAC) 600 MG CAPS  ? amLODipine (NORVASC) 5 MG tablet  ? Ascorbic Acid (VITAMIN C PO)  ? budesonide (ENTOCORT EC) 3 MG 24 hr capsule  ? buPROPion (WELLBUTRIN XL) 150 MG 24 hr tablet  ? calcium carbonate (OS-CAL) 600 MG TABS  ? cholecalciferol (VITAMIN D3) 25 MCG (1000 UNIT) tablet  ? diclofenac Sodium (VOLTAREN) 1 % GEL  ? diphenhydrAMINE (BENADRYL) 25 mg capsule  ?  finasteride (PROSCAR) 5 MG tablet  ? fluticasone (FLONASE) 50 MCG/ACT nasal spray  ? losartan (COZAAR) 100 MG tablet  ? magnesium oxide (MAG-OX) 400 MG tablet  ? Melatonin 5 MG TABS  ? Multiple Vitamin (MULTIVITAMIN) tablet  ? NUCYNTA 50 MG tablet  ? Omega-3 1000 MG CAPS  ? omeprazole (PRILOSEC) 20 MG capsule  ? oxyCODONE (OXY IR/ROXICODONE) 5 MG immediate release tablet  ? SUMAtriptan (IMITREX) 50 MG tablet  ? thiamine (VITAMIN B-1) 100 MG tablet  ? vitamin B-12 (CYANOCOBALAMIN) 1000 MCG tablet  ? ?No current facility-administered medications for this encounter.  ? ? ?Konrad Felix Ward, PA-C ?[facility] ?[phone] ? ? ? ? ? ? ?

## 2022-01-18 ENCOUNTER — Encounter: Payer: Self-pay | Admitting: Internal Medicine

## 2022-01-18 ENCOUNTER — Ambulatory Visit (INDEPENDENT_AMBULATORY_CARE_PROVIDER_SITE_OTHER): Payer: Medicare Other | Admitting: Internal Medicine

## 2022-01-18 VITALS — BP 126/88 | HR 88 | Ht 63.0 in | Wt 107.8 lb

## 2022-01-18 DIAGNOSIS — Z79899 Other long term (current) drug therapy: Secondary | ICD-10-CM | POA: Diagnosis not present

## 2022-01-18 DIAGNOSIS — I10 Essential (primary) hypertension: Secondary | ICD-10-CM | POA: Diagnosis not present

## 2022-01-18 NOTE — Patient Instructions (Signed)
Medication Instructions:  ?Your physician recommends that you continue on your current medications as directed. Please refer to the Current Medication list given to you today. ? ?*If you need a refill on your cardiac medications before your next appointment, please call your pharmacy* ? ? ?Lab Work: ?NONE ?If you have labs (blood work) drawn today and your tests are completely normal, you will receive your results only by: ?MyChart Message (if you have MyChart) OR ?A paper copy in the mail ?If you have any lab test that is abnormal or we need to change your treatment, we will call you to review the results. ? ? ?Testing/Procedures: ?NONE ? ? ?Follow-Up: ?At [facility], you and your health needs are our priority.  As part of our continuing mission to provide you with exceptional heart care, we have created designated Provider Care Teams.  These Care Teams include your primary Cardiologist (physician) and Advanced Practice Providers (APPs -  Physician Assistants and Nurse Practitioners) who all work together to provide you with the care you need, when you need it. ? ?We recommend signing up for the patient portal called "MyChart".  Sign up information is provided on this After Visit Summary.  MyChart is used to connect with patients for Virtual Visits (Telemedicine).  Patients are able to view lab/test results, encounter notes, upcoming appointments, etc.  Non-urgent messages can be sent to your provider as well.   ?To learn more about what you can do with MyChart, go to [email or web address].   ? ?Your next appointment:   ?1 year(s) ?Dr Dorris Carnes  ? ?If primary card or EP is not listed click here to update    :1}  ? ? ?Other Instructions ? ? ?Important Information About Sugar ? ? ? ? ?  ?

## 2022-01-18 NOTE — Progress Notes (Signed)
? ?Cardiology Office Note ? ? ?Date:  01/18/2022  ? ?ID:  Elaine Navarro, DOB 03-Nov-1945, MRN [id number] ? ?PCP:  Vernie Shanks, MD  ?Cardiologist:   Dorris Carnes, MD  ? ? ?Pt referred for evaluation of abnormal EKG   LBBB ?  ?History of Present Illness: ?Elaine Navarro is a 76 y.o. female with a history of HTN.   She has no known CAD    Upt to a few wks ago the pt was very active, dancing competitively.     Denies CP   NO SOB   No dizziness  No syncope    ? ?The pt is being evaluated for surgery on R knee    Had preop evaluation done  with  EKG that showed LBBB Again, new.   She denies dizziness ? ? ? ? ? ?Current Meds  ?Medication Sig  ? Acetylcysteine (NAC) 600 MG CAPS Take 600 mg by mouth daily.  ? amLODipine (NORVASC) 5 MG tablet Take 5 mg by mouth daily.  ? Ascorbic Acid (VITAMIN C PO) Take 500 mg by mouth daily.  ? budesonide (ENTOCORT EC) 3 MG 24 hr capsule Take 3 mg by mouth daily.  ? buPROPion (WELLBUTRIN XL) 150 MG 24 hr tablet Take 1 tablet (150 mg total) by mouth daily.  ? calcium carbonate (OS-CAL) 600 MG TABS Take 600 mg by mouth daily with breakfast.  ? cholecalciferol (VITAMIN D3) 25 MCG (1000 UNIT) tablet Take 1,000 Units by mouth daily.  ? diclofenac Sodium (VOLTAREN) 1 % GEL Apply 1 application. topically daily as needed (pain).  ? diphenhydrAMINE (BENADRYL) 25 mg capsule Take 25 mg by mouth daily as needed for allergies.  ? finasteride (PROSCAR) 5 MG tablet Take 5 mg by mouth daily.  ? fluticasone (FLONASE) 50 MCG/ACT nasal spray Place 1 spray into both nostrils daily as needed for allergies.  ? losartan (COZAAR) 100 MG tablet Take 100 mg by mouth daily.  ? magnesium oxide (MAG-OX) 400 MG tablet Take 400 mg by mouth daily.  ? Melatonin 5 MG TABS Take 5 mg by mouth at bedtime as needed (sleep).  ? Multiple Vitamin (MULTIVITAMIN) tablet Take 1 tablet by mouth daily.  ? NUCYNTA 50 MG tablet Take 50 mg by mouth 4 (four) times daily as needed for severe pain.  ? Omega-3 1000 MG CAPS Take  1,000 mg by mouth daily.  ? omeprazole (PRILOSEC) 20 MG capsule Take 20 mg by mouth 2 (two) times daily before a meal.  ? oxyCODONE (OXY IR/ROXICODONE) 5 MG immediate release tablet Take 5-10 mg by mouth every 6 (six) hours as needed for severe pain.  ? SUMAtriptan (IMITREX) 50 MG tablet Take 50 mg by mouth every 2 (two) hours as needed for migraine.  ? thiamine (VITAMIN B-1) 100 MG tablet Take 100 mg by mouth daily.  ? vitamin B-12 (CYANOCOBALAMIN) 1000 MCG tablet Take 1,000 mcg by mouth daily.  ? ? ? ?Allergies:   Caine-1 [lidocaine], Indomethacin, Articaine-epinephrine, Hydrocodone, Hydrocodone-acetaminophen, Latex, and Lamisil [terbinafine]  ? ?Past Medical History:  ?Diagnosis Date  ? Aneurysm of renal artery (HCC)   ? followed at [facility], stable with CT/angiogram 10/2021  ? Fracture of right wrist 2011  ? Fell in bathroom.  ? Hypertension   ? Left thyroid nodule 2003  ? S/P biopsy - benign  ? Lymphocytic colitis 03/2013  ? Migraines   ? Osteoporosis   ? PONV (postoperative nausea and vomiting)   ? Sciatica   ? Visceral injury   ?  detached due to recent fall  ? ? ?Past Surgical History:  ?Procedure Laterality Date  ? ABDOMINOPLASTY  2010  ? APPENDECTOMY  1962  ? BREAST EXCISIONAL BIOPSY Bilateral 2001  ? x 3  ? CATARACT EXTRACTION, BILATERAL Bilateral 01/2020  ? Dental implants  1993  ? [place]  ? KNEE ARTHROSCOPY W/ DEBRIDEMENT Right 07/2020  ? Dr. Berenice Primas  ? VAGINAL HYSTERECTOMY  1974  ? Dysfunctional uterine bleeding.  ? ? ? ?Social History:  The patient  reports that she quit smoking about 39 years ago. Her smoking use included cigarettes. She has a 30.00 pack-year smoking history. She has never used smokeless tobacco. She reports current alcohol use of about 14.0 standard drinks per week. She reports that she does not use drugs.  ? ?Family History:  The patient's family history includes Autoimmune disease in her sister; Breast cancer (age of onset: 41) in her daughter; Cancer in her sister;  Hypertension in her mother; Lupus in her brother.  ? ? ?ROS:  Please see the history of present illness. All other systems are reviewed and  Negative to the above problem except as noted.  ? ? ?PHYSICAL EXAM: ?VS:  BP 126/88   Pulse 88   Ht '5\' 3"'$  (1.6 m)   Wt 107 lb 12.8 oz (48.9 kg)   LMP 12/14/1972   SpO2 99%   BMI 19.10 kg/m?   ?NLZ:JQBH 76 yo in NAD  ?HEENT: normal  ?Neck: no JVD, carotid bruit ?Cardiac: RRR; no murmurs,   No LE edema   ?Respiratory:  clear to auscultation bilaterally,  ?GI: soft, nontender, nondistended, + BS  No hepatomegaly  ?MS: no deformity Moving all extremities   ?Skin: warm and dry, no rash ?Neuro:  Strength and sensation are intact ?Psych: euthymic mood, full affect ? ? ?EKG:  EKG today   On 01/12/22   SR 74  LBBB ? ? ?Lipid Panel ?No results found for: CHOL, TRIG, HDL, CHOLHDL, VLDL, LDLCALC, LDLDIRECT ?  ? ?Wt Readings from Last 3 Encounters:  ?01/18/22 107 lb 12.8 oz (48.9 kg)  ?01/12/22 107 lb (48.5 kg)  ?12/28/21 108 lb (49 kg)  ?  ? ? ?ASSESSMENT AND PLAN: ? ?1  LBBB    Newly documenet   ON EKG as well today    ?She dnies dizziness or syncope    She was extremely active up until a few wks ago    ?Denies CP     ?From a cardiac standpoint I will plan to follow periodicaly to evaluate for further slowing    Currently she is asymptomatic.  ? ?I think she is low risk and OK to proceed with surgery without further testing ? ?2  HTN  BP is under adequate control ? ?Will follow up pt later this winter  ? ? ?Current medicines are reviewed at length with the patient today.  The patient does not have concerns regarding medicines. ? ?Signed, ?Dorris Carnes, MD  ?01/18/2022 4:25 PM    ?[facility] [place] ?[address], [place], [place]  [address] ?Phone: [phone]; Fax: [phone]  ? ? ?

## 2022-01-19 ENCOUNTER — Telehealth: Payer: Self-pay | Admitting: *Deleted

## 2022-01-19 NOTE — Anesthesia Preprocedure Evaluation (Addendum)
Anesthesia Evaluation  ?Patient identified by MRN, date of birth, ID band ?Patient awake ? ? ? ?Reviewed: ?Allergy & Precautions, NPO status , Patient's Chart, lab work & pertinent test results ? ?History of Anesthesia Complications ?(+) PONV and history of anesthetic complications ? ?Airway ?Mallampati: I ? ?TM Distance: >3 FB ?Neck ROM: Full ? ? ? Dental ? ?(+) Teeth Intact, Dental Advisory Given ?  ?Pulmonary ?former smoker,  ?  ?breath sounds clear to auscultation ? ? ? ? ? ? Cardiovascular ?hypertension,  ?Rhythm:Regular Rate:Normal ? ? ?  ?Neuro/Psych ? Headaches, negative psych ROS  ? GI/Hepatic ?negative GI ROS, Neg liver ROS,   ?Endo/Other  ?negative endocrine ROS ? Renal/GU ?negative Renal ROS  ? ?  ?Musculoskeletal ? ?(+) Arthritis ,  ? Abdominal ?Normal abdominal exam  (+)   ?Peds ? Hematology ?negative hematology ROS ?(+)   ?Anesthesia Other Findings ? ? Reproductive/Obstetrics ?negative OB ROS ? ?  ? ? ? ? ? ? ? ? ? ? ? ? ? ?  ?  ? ? ? ? ? ? ?Anesthesia Physical ?Anesthesia Plan ? ?ASA: 2 ? ?Anesthesia Plan: Spinal  ? ?Post-op Pain Management: Regional block*  ? ?Induction: Intravenous ? ?PONV Risk Score and Plan: 4 or greater and Ondansetron, Dexamethasone, Propofol infusion and TIVA ? ?Airway Management Planned: Natural Airway and Simple Face Mask ? ?Additional Equipment: None ? ?Intra-op Plan:  ? ?Post-operative Plan:  ? ?Informed Consent: I have reviewed the patients History and Physical, chart, labs and discussed the procedure including the risks, benefits and alternatives for the proposed anesthesia with the patient or authorized representative who has indicated his/her understanding and acceptance.  ? ? ? ? ? ?Plan Discussed with: CRNA ? ?Anesthesia Plan Comments: (See PAT note 01/12/2022 ? ?Lab Results ?     Component                Value               Date                 ?     WBC                      6.1                 01/12/2022           ?     HGB                       13.8                01/12/2022           ?     HCT                      40.9                01/12/2022           ?     MCV                      95.6                01/12/2022           ?     PLT  284                 01/12/2022           ?)  ? ? ? ? ?Anesthesia Quick Evaluation ? ?

## 2022-01-19 NOTE — Care Plan (Signed)
[facility] RNCM call to patient to discuss her upcoming Right total knee arthroplasty on 01/21/22 with Dr. Ninfa Linden. She is an Ortho bundle patient through [facility] and is agreeable to case management. She lives with her spouse, who will be assisting after surgery. She has a 3in1/BSC at home, but will need a RW. She has a walker without wheels. Can assist with this. Anticipate HHPT will be needed after a short hospital stay. Referral to [facility] after choice provided. She wishes to attend OPPT here at [facility] when appropriate. We discussed issues she has with sciatica and that it is currently causing her much pain on the same side as her upcoming knee replacement. CM will convey this to HHPT as well. Reviewed all post op care instructions. Will continue to follow for needs. ?

## 2022-01-19 NOTE — Telephone Encounter (Signed)
Ortho bundle pre-op call completed. 

## 2022-01-20 DIAGNOSIS — M1711 Unilateral primary osteoarthritis, right knee: Secondary | ICD-10-CM

## 2022-01-20 NOTE — H&P (Signed)
TOTAL KNEE ADMISSION H&P ? ?Patient is being admitted for right total knee arthroplasty. ? ?Subjective: ? ?Chief Complaint:right knee pain. ? ?HPI: [name], 76 y.o. female, has a history of pain and functional disability in the right knee due to arthritis and has failed non-surgical conservative treatments for greater than 12 weeks to includeNSAID's and/or analgesics, corticosteriod injections, viscosupplementation injections, flexibility and strengthening excercises, activity modification, and nerve ablation procedures .  Onset of symptoms was gradual, starting 3 years ago with gradually worsening course since that time. The patient noted prior procedures on the knee to include  arthroscopy on the right knee(s).  Patient currently rates pain in the right knee(s) at 10 out of 10 with activity. Patient has night pain, worsening of pain with activity and weight bearing, pain that interferes with activities of daily living, pain with passive range of motion, and crepitus.  Patient has evidence of subchondral sclerosis and joint space narrowing by imaging studies. There is no active infection. ? ?Patient Active Problem List  ? Diagnosis Date Noted  ? Arthritis of right knee 01/20/2022  ? Lung nodules 12/28/2021  ? Primary hypertension 05/13/2021  ? Thyroid nodule 05/13/2021  ? History of depression 05/13/2021  ? H/O: hysterectomy 05/13/2021  ? Postmenopausal 05/13/2021  ? Piriformis syndrome of left side 04/07/2021  ? Chronic pain of right knee 03/04/2021  ? H/O sciatica 11/04/2020  ? Age-related osteoporosis without current pathological fracture 11/04/2020  ? Vitamin D insufficiency 12/20/2019  ? Actinic keratosis 03/21/2017  ? Focal lymphocytic colitis 05/29/2013  ? Aneurysm artery, renal ([place]) 09/28/2012  ? Headache, chronic migraine without aura, intractable 03/02/2012  ? ?Past Medical History:  ?Diagnosis Date  ? Aneurysm of renal artery (HCC)   ? followed at [facility], stable with CT/angiogram 10/2021  ?  Fracture of right wrist 2011  ? Fell in bathroom.  ? Hypertension   ? Left thyroid nodule 2003  ? S/P biopsy - benign  ? Lymphocytic colitis 03/2013  ? Migraines   ? Osteoporosis   ? PONV (postoperative nausea and vomiting)   ? Sciatica   ? Visceral injury   ? detached due to recent fall  ?  ?Past Surgical History:  ?Procedure Laterality Date  ? ABDOMINOPLASTY  2010  ? APPENDECTOMY  1962  ? BREAST EXCISIONAL BIOPSY Bilateral 2001  ? x 3  ? CATARACT EXTRACTION, BILATERAL Bilateral 01/2020  ? Dental implants  1993  ? [place]  ? KNEE ARTHROSCOPY W/ DEBRIDEMENT Right 07/2020  ? Dr. Berenice Primas  ? VAGINAL HYSTERECTOMY  1974  ? Dysfunctional uterine bleeding.  ?  ?No current facility-administered medications for this encounter.  ? ?Current Outpatient Medications  ?Medication Sig Dispense Refill Last Dose  ? Acetylcysteine (NAC) 600 MG CAPS Take 600 mg by mouth daily.     ? amLODipine (NORVASC) 5 MG tablet Take 5 mg by mouth daily.     ? Ascorbic Acid (VITAMIN C PO) Take 500 mg by mouth daily.     ? budesonide (ENTOCORT EC) 3 MG 24 hr capsule Take 3 mg by mouth daily.     ? buPROPion (WELLBUTRIN XL) 150 MG 24 hr tablet Take 1 tablet (150 mg total) by mouth daily. 90 tablet 4   ? calcium carbonate (OS-CAL) 600 MG TABS Take 600 mg by mouth daily with breakfast.     ? cholecalciferol (VITAMIN D3) 25 MCG (1000 UNIT) tablet Take 1,000 Units by mouth daily.     ? diclofenac Sodium (VOLTAREN) 1 %  GEL Apply 1 application. topically daily as needed (pain).     ? diphenhydrAMINE (BENADRYL) 25 mg capsule Take 25 mg by mouth daily as needed for allergies.     ? finasteride (PROSCAR) 5 MG tablet Take 5 mg by mouth daily.     ? fluticasone (FLONASE) 50 MCG/ACT nasal spray Place 1 spray into both nostrils daily as needed for allergies.  5   ? losartan (COZAAR) 100 MG tablet Take 100 mg by mouth daily.     ? magnesium oxide (MAG-OX) 400 MG tablet Take 400 mg by mouth daily.     ? Melatonin 5 MG TABS Take 5 mg by mouth at  bedtime as needed (sleep).     ? Multiple Vitamin (MULTIVITAMIN) tablet Take 1 tablet by mouth daily.     ? NUCYNTA 50 MG tablet Take 50 mg by mouth 4 (four) times daily as needed for severe pain.     ? Omega-3 1000 MG CAPS Take 1,000 mg by mouth daily.     ? omeprazole (PRILOSEC) 20 MG capsule Take 20 mg by mouth 2 (two) times daily before a meal.     ? SUMAtriptan (IMITREX) 50 MG tablet Take 50 mg by mouth every 2 (two) hours as needed for migraine.     ? thiamine (VITAMIN B-1) 100 MG tablet Take 100 mg by mouth daily.     ? vitamin B-12 (CYANOCOBALAMIN) 1000 MCG tablet Take 1,000 mcg by mouth daily.     ? oxyCODONE (OXY IR/ROXICODONE) 5 MG immediate release tablet Take 5-10 mg by mouth every 6 (six) hours as needed for severe pain.     ? ?Allergies  ?Allergen Reactions  ? Caine-1 [Lidocaine]   ?  Allergic to Septocaine Articaine HCL 4% w/epi  ? Indomethacin Swelling  ? Articaine-Epinephrine Other (See Comments)  ? Hydrocodone Nausea Only  ? Hydrocodone-Acetaminophen Nausea Only  ? Latex   ?  Possible allergy.  ? Lamisil [Terbinafine] Rash  ?  ?Social History  ? ?Tobacco Use  ? Smoking status: Former  ?  Packs/day: 3.00  ?  Years: 10.00  ?  Pack years: 30.00  ?  Types: Cigarettes  ?  Quit date: 12/15/1982  ?  Years since quitting: 39.1  ? Smokeless tobacco: Never  ?Substance Use Topics  ? Alcohol use: Yes  ?  Alcohol/week: 14.0 standard drinks  ?  Types: 14 Standard drinks or equivalent per week  ?  ?Family History  ?Problem Relation Age of Onset  ? Hypertension Mother   ? Cancer Sister   ?     melanoma  ? Autoimmune disease Sister   ?     crest and sjor  ? Breast cancer Daughter 32  ? Lupus Brother   ?  ? ?Review of Systems  ?All other systems reviewed and are negative. ? ?Objective: ? ?Physical Exam ?Vitals reviewed.  ?Constitutional:   ?   Appearance: Normal appearance.  ?HENT:  ?   Head: Normocephalic and atraumatic.  ?Eyes:  ?   Extraocular Movements: Extraocular movements intact.  ?   Pupils: Pupils are equal,  round, and reactive to light.  ?Cardiovascular:  ?   Rate and Rhythm: Normal rate and regular rhythm.  ?Pulmonary:  ?   Effort: Pulmonary effort is normal.  ?   Breath sounds: Normal breath sounds.  ?Abdominal:  ?   Palpations: Abdomen is soft.  ?Musculoskeletal:  ?   Cervical back: Normal range of motion and neck supple.  ?  Right knee: Bony tenderness and crepitus present. Tenderness present over the medial joint line, lateral joint line and patellar tendon. Abnormal meniscus.  ?Neurological:  ?   Mental Status: She is alert and oriented to person, place, and time.  ?Psychiatric:     ?   Behavior: Behavior normal.  ? ? ?Vital signs in last 24 hours: ?  ? ?Labs: ? ? ?Estimated body mass index is 19.1 kg/m? as calculated from the following: ?  Height as of 01/18/22: '5\' 3"'$  (1.6 m). ?  Weight as of 01/18/22: 48.9 kg. ? ? ?Imaging Review ?Plain radiographs demonstrate moderate degenerative joint disease of the right knee(s). The overall alignment isneutral. The bone quality appears to be good for age and reported activity level. ? ? ? ? ? ?Assessment/Plan: ? ?End stage arthritis, right knee  ? ?The patient history, physical examination, clinical judgment of the provider and imaging studies are consistent with end stage degenerative joint disease of the right knee(s) and total knee arthroplasty is deemed medically necessary. The treatment options including medical management, injection therapy arthroscopy and arthroplasty were discussed at length. The risks and benefits of total knee arthroplasty were presented and reviewed. The risks due to aseptic loosening, infection, stiffness, patella tracking problems, thromboembolic complications and other imponderables were discussed. The patient acknowledged the explanation, agreed to proceed with the plan and consent was signed. Patient is being admitted for inpatient treatment for surgery, pain control, PT, OT, prophylactic antibiotics, VTE prophylaxis, progressive ambulation  and ADL's and discharge planning. The patient is planning to be discharged home with home health services ? ? ? ? ?

## 2022-01-21 ENCOUNTER — Encounter (HOSPITAL_COMMUNITY): Payer: Self-pay | Admitting: Orthopaedic Surgery

## 2022-01-21 ENCOUNTER — Inpatient Hospital Stay (HOSPITAL_COMMUNITY)
Admission: RE | Admit: 2022-01-21 | Discharge: 2022-01-24 | DRG: 470 | Disposition: A | Discharge status: Home Health | Payer: Medicare Other | Source: Ambulatory Visit | Attending: Orthopaedic Surgery | Admitting: Orthopaedic Surgery

## 2022-01-21 ENCOUNTER — Other Ambulatory Visit: Payer: Self-pay

## 2022-01-21 ENCOUNTER — Encounter (HOSPITAL_COMMUNITY)
Admission: RE | Disposition: A | Discharge status: Home Health | Payer: Self-pay | Source: Ambulatory Visit | Attending: Orthopaedic Surgery

## 2022-01-21 ENCOUNTER — Ambulatory Visit (HOSPITAL_COMMUNITY): Payer: Medicare Other | Admitting: Physician Assistant

## 2022-01-21 ENCOUNTER — Observation Stay (HOSPITAL_COMMUNITY): Payer: Medicare Other

## 2022-01-21 ENCOUNTER — Ambulatory Visit (HOSPITAL_BASED_OUTPATIENT_CLINIC_OR_DEPARTMENT_OTHER): Payer: Medicare Other | Admitting: Anesthesiology

## 2022-01-21 DIAGNOSIS — Z79899 Other long term (current) drug therapy: Secondary | ICD-10-CM | POA: Diagnosis not present

## 2022-01-21 DIAGNOSIS — Z832 Family history of diseases of the blood and blood-forming organs and certain disorders involving the immune mechanism: Secondary | ICD-10-CM

## 2022-01-21 DIAGNOSIS — Z91040 Latex allergy status: Secondary | ICD-10-CM | POA: Diagnosis not present

## 2022-01-21 DIAGNOSIS — Z885 Allergy status to narcotic agent status: Secondary | ICD-10-CM

## 2022-01-21 DIAGNOSIS — G8929 Other chronic pain: Secondary | ICD-10-CM | POA: Diagnosis present

## 2022-01-21 DIAGNOSIS — Z808 Family history of malignant neoplasm of other organs or systems: Secondary | ICD-10-CM

## 2022-01-21 DIAGNOSIS — M810 Age-related osteoporosis without current pathological fracture: Secondary | ICD-10-CM | POA: Diagnosis present

## 2022-01-21 DIAGNOSIS — M1711 Unilateral primary osteoarthritis, right knee: Secondary | ICD-10-CM

## 2022-01-21 DIAGNOSIS — Z96651 Presence of right artificial knee joint: Secondary | ICD-10-CM

## 2022-01-21 DIAGNOSIS — Z01818 Encounter for other preprocedural examination: Secondary | ICD-10-CM

## 2022-01-21 DIAGNOSIS — Z888 Allergy status to other drugs, medicaments and biological substances status: Secondary | ICD-10-CM | POA: Diagnosis not present

## 2022-01-21 DIAGNOSIS — R918 Other nonspecific abnormal finding of lung field: Secondary | ICD-10-CM | POA: Diagnosis present

## 2022-01-21 DIAGNOSIS — Z8249 Family history of ischemic heart disease and other diseases of the circulatory system: Secondary | ICD-10-CM

## 2022-01-21 DIAGNOSIS — I722 Aneurysm of renal artery: Secondary | ICD-10-CM | POA: Diagnosis present

## 2022-01-21 DIAGNOSIS — Z886 Allergy status to analgesic agent status: Secondary | ICD-10-CM | POA: Diagnosis not present

## 2022-01-21 DIAGNOSIS — G8918 Other acute postprocedural pain: Secondary | ICD-10-CM | POA: Diagnosis not present

## 2022-01-21 DIAGNOSIS — Z87891 Personal history of nicotine dependence: Secondary | ICD-10-CM

## 2022-01-21 DIAGNOSIS — I10 Essential (primary) hypertension: Secondary | ICD-10-CM | POA: Diagnosis present

## 2022-01-21 DIAGNOSIS — Z803 Family history of malignant neoplasm of breast: Secondary | ICD-10-CM

## 2022-01-21 HISTORY — PX: TOTAL KNEE ARTHROPLASTY: SHX125

## 2022-01-21 SURGERY — ARTHROPLASTY, KNEE, TOTAL
Anesthesia: Spinal | Site: Knee | Laterality: Right

## 2022-01-21 MED ORDER — LACTATED RINGERS IV SOLN: INTRAVENOUS | Status: DC

## 2022-01-21 MED ORDER — PHENOL 1.4 % MT LIQD: 1.0000 | OROMUCOSAL | Status: DC | PRN

## 2022-01-21 MED ORDER — BUPIVACAINE-EPINEPHRINE (PF) 0.25% -1:200000 IJ SOLN
INTRAMUSCULAR | Status: AC
  Filled 2022-01-21: qty 30

## 2022-01-21 MED ORDER — HYDROMORPHONE HCL 2 MG PO TABS
2.0000 mg | ORAL_TABLET | ORAL | Status: DC | PRN
  Administered 2022-01-21 – 2022-01-23 (×9): 2 mg via ORAL
  Filled 2022-01-21 (×6): qty 1

## 2022-01-21 MED ORDER — ONDANSETRON HCL 4 MG/2ML IJ SOLN
4.0000 mg | Freq: Four times a day (QID) | INTRAMUSCULAR | Status: DC | PRN
  Administered 2022-01-22: 4 mg via INTRAVENOUS
  Filled 2022-01-21: qty 2

## 2022-01-21 MED ORDER — ORAL CARE MOUTH RINSE: 15.0000 mL | Freq: Once | OROMUCOSAL | Status: AC

## 2022-01-21 MED ORDER — METOCLOPRAMIDE HCL 5 MG/ML IJ SOLN: 5.0000 mg | Freq: Three times a day (TID) | INTRAMUSCULAR | Status: DC | PRN

## 2022-01-21 MED ORDER — BUPIVACAINE IN DEXTROSE 0.75-8.25 % IT SOLN
INTRATHECAL | Status: DC | PRN
  Administered 2022-01-21: 1.6 mL via INTRATHECAL

## 2022-01-21 MED ORDER — LOSARTAN POTASSIUM 50 MG PO TABS
100.0000 mg | ORAL_TABLET | Freq: Every day | ORAL | Status: DC
  Administered 2022-01-22 – 2022-01-23 (×2): 100 mg via ORAL
  Filled 2022-01-21 (×3): qty 2

## 2022-01-21 MED ORDER — SODIUM CHLORIDE 0.9 % IR SOLN
Status: DC | PRN
  Administered 2022-01-21: 1000 mL

## 2022-01-21 MED ORDER — ADULT MULTIVITAMIN W/MINERALS CH
1.0000 | ORAL_TABLET | Freq: Every day | ORAL | Status: DC
  Administered 2022-01-22 – 2022-01-24 (×3): 1 via ORAL
  Filled 2022-01-21 (×3): qty 1

## 2022-01-21 MED ORDER — VITAMIN D 25 MCG (1000 UNIT) PO TABS
1000.0000 [IU] | ORAL_TABLET | Freq: Every day | ORAL | Status: DC
  Administered 2022-01-22 – 2022-01-24 (×3): 1000 [IU] via ORAL
  Filled 2022-01-21 (×3): qty 1

## 2022-01-21 MED ORDER — THIAMINE HCL 100 MG PO TABS
100.0000 mg | ORAL_TABLET | Freq: Every day | ORAL | Status: DC
  Administered 2022-01-22 – 2022-01-24 (×3): 100 mg via ORAL
  Filled 2022-01-21 (×4): qty 1

## 2022-01-21 MED ORDER — METHOCARBAMOL 500 MG IVPB - SIMPLE MED
500.0000 mg | Freq: Four times a day (QID) | INTRAVENOUS | Status: DC | PRN
  Filled 2022-01-21: qty 50

## 2022-01-21 MED ORDER — METHOCARBAMOL 500 MG PO TABS
500.0000 mg | ORAL_TABLET | Freq: Four times a day (QID) | ORAL | Status: DC | PRN
  Administered 2022-01-21 – 2022-01-22 (×3): 500 mg via ORAL
  Filled 2022-01-21 (×3): qty 1

## 2022-01-21 MED ORDER — FINASTERIDE 5 MG PO TABS
5.0000 mg | ORAL_TABLET | Freq: Every day | ORAL | Status: DC
  Administered 2022-01-22 – 2022-01-24 (×3): 5 mg via ORAL
  Filled 2022-01-21 (×3): qty 1

## 2022-01-21 MED ORDER — ONDANSETRON HCL 4 MG PO TABS
4.0000 mg | ORAL_TABLET | Freq: Four times a day (QID) | ORAL | Status: DC | PRN
  Administered 2022-01-23: 4 mg via ORAL
  Filled 2022-01-21 (×2): qty 1

## 2022-01-21 MED ORDER — EPHEDRINE SULFATE-NACL 50-0.9 MG/10ML-% IV SOSY
PREFILLED_SYRINGE | INTRAVENOUS | Status: DC | PRN
  Administered 2022-01-21 (×2): 5 mg via INTRAVENOUS

## 2022-01-21 MED ORDER — DOCUSATE SODIUM 100 MG PO CAPS
100.0000 mg | ORAL_CAPSULE | Freq: Two times a day (BID) | ORAL | Status: DC
  Administered 2022-01-22 – 2022-01-24 (×5): 100 mg via ORAL
  Filled 2022-01-21 (×6): qty 1

## 2022-01-21 MED ORDER — TRANEXAMIC ACID-NACL 1000-0.7 MG/100ML-% IV SOLN
INTRAVENOUS | Status: DC | PRN
  Administered 2022-01-21: 1000 mg via INTRAVENOUS

## 2022-01-21 MED ORDER — BUDESONIDE 3 MG PO CPEP
3.0000 mg | ORAL_CAPSULE | Freq: Every day | ORAL | Status: DC
  Administered 2022-01-22 – 2022-01-24 (×3): 3 mg via ORAL
  Filled 2022-01-21 (×3): qty 1

## 2022-01-21 MED ORDER — PROPOFOL 1000 MG/100ML IV EMUL
INTRAVENOUS | Status: AC
  Filled 2022-01-21: qty 100

## 2022-01-21 MED ORDER — POVIDONE-IODINE 10 % EX SWAB
2.0000 "application " | Freq: Once | CUTANEOUS | Status: AC
  Administered 2022-01-21: 2 via TOPICAL

## 2022-01-21 MED ORDER — ACETYLCYSTEINE 600 MG PO CAPS: 600.0000 mg | ORAL_CAPSULE | Freq: Every day | ORAL | Status: DC

## 2022-01-21 MED ORDER — APIXABAN 2.5 MG PO TABS
2.5000 mg | ORAL_TABLET | Freq: Two times a day (BID) | ORAL | Status: DC
  Administered 2022-01-22: 2.5 mg via ORAL
  Filled 2022-01-21: qty 1

## 2022-01-21 MED ORDER — VITAMIN B-12 1000 MCG PO TABS
1000.0000 ug | ORAL_TABLET | Freq: Every day | ORAL | Status: DC
  Administered 2022-01-22 – 2022-01-24 (×3): 1000 ug via ORAL
  Filled 2022-01-21 (×3): qty 1

## 2022-01-21 MED ORDER — SODIUM CHLORIDE 0.9 % IV SOLN: INTRAVENOUS | Status: DC

## 2022-01-21 MED ORDER — ACETAMINOPHEN 325 MG PO TABS
325.0000 mg | ORAL_TABLET | Freq: Four times a day (QID) | ORAL | Status: DC | PRN
  Administered 2022-01-21 – 2022-01-23 (×5): 650 mg via ORAL
  Filled 2022-01-21 (×5): qty 2

## 2022-01-21 MED ORDER — EPHEDRINE 5 MG/ML INJ
INTRAVENOUS | Status: AC
  Filled 2022-01-21: qty 5

## 2022-01-21 MED ORDER — STERILE WATER FOR IRRIGATION IR SOLN
Status: DC | PRN
  Administered 2022-01-21: 2000 mL

## 2022-01-21 MED ORDER — METOCLOPRAMIDE HCL 5 MG PO TABS
5.0000 mg | ORAL_TABLET | Freq: Three times a day (TID) | ORAL | Status: DC | PRN
  Filled 2022-01-21: qty 2

## 2022-01-21 MED ORDER — HYDROMORPHONE HCL 2 MG PO TABS
1.0000 mg | ORAL_TABLET | ORAL | Status: DC | PRN
  Administered 2022-01-21 – 2022-01-22 (×2): 2 mg via ORAL
  Administered 2022-01-24: 1 mg via ORAL
  Administered 2022-01-24: 2 mg via ORAL
  Filled 2022-01-21 (×7): qty 1

## 2022-01-21 MED ORDER — DIPHENHYDRAMINE HCL 12.5 MG/5ML PO ELIX
12.5000 mg | ORAL_SOLUTION | ORAL | Status: DC | PRN
  Administered 2022-01-21 – 2022-01-23 (×3): 25 mg via ORAL
  Filled 2022-01-21 (×3): qty 10

## 2022-01-21 MED ORDER — CEFAZOLIN SODIUM-DEXTROSE 2-4 GM/100ML-% IV SOLN
2.0000 g | INTRAVENOUS | Status: AC
  Administered 2022-01-21: 2 g via INTRAVENOUS
  Filled 2022-01-21: qty 100

## 2022-01-21 MED ORDER — AMLODIPINE BESYLATE 5 MG PO TABS
5.0000 mg | ORAL_TABLET | Freq: Every day | ORAL | Status: DC
  Administered 2022-01-22 – 2022-01-23 (×2): 5 mg via ORAL
  Filled 2022-01-21 (×3): qty 1

## 2022-01-21 MED ORDER — MENTHOL 3 MG MT LOZG: 1.0000 | LOZENGE | OROMUCOSAL | Status: DC | PRN

## 2022-01-21 MED ORDER — ALUM & MAG HYDROXIDE-SIMETH 200-200-20 MG/5ML PO SUSP: 30.0000 mL | ORAL | Status: DC | PRN

## 2022-01-21 MED ORDER — FENTANYL CITRATE PF 50 MCG/ML IJ SOSY
50.0000 ug | PREFILLED_SYRINGE | INTRAMUSCULAR | Status: DC
  Administered 2022-01-21: 50 ug via INTRAVENOUS
  Filled 2022-01-21: qty 2

## 2022-01-21 MED ORDER — PANTOPRAZOLE SODIUM 40 MG PO TBEC
40.0000 mg | DELAYED_RELEASE_TABLET | Freq: Every day | ORAL | Status: DC
  Administered 2022-01-22 – 2022-01-24 (×3): 40 mg via ORAL
  Filled 2022-01-21 (×3): qty 1

## 2022-01-21 MED ORDER — 0.9 % SODIUM CHLORIDE (POUR BTL) OPTIME
TOPICAL | Status: DC | PRN
Start: 2022-01-21 — End: 2022-01-21
  Administered 2022-01-21: 1000 mL

## 2022-01-21 MED ORDER — HYDROMORPHONE HCL 1 MG/ML IJ SOLN
0.5000 mg | INTRAMUSCULAR | Status: DC | PRN
  Administered 2022-01-21 (×2): 1 mg via INTRAVENOUS
  Administered 2022-01-22: 0.5 mg via INTRAVENOUS
  Filled 2022-01-21 (×3): qty 1

## 2022-01-21 MED ORDER — ONDANSETRON HCL 4 MG/2ML IJ SOLN
INTRAMUSCULAR | Status: AC
  Filled 2022-01-21: qty 2

## 2022-01-21 MED ORDER — BUPIVACAINE-EPINEPHRINE 0.25% -1:200000 IJ SOLN
INTRAMUSCULAR | Status: DC | PRN
  Administered 2022-01-21: 30 mL

## 2022-01-21 MED ORDER — PROPOFOL 10 MG/ML IV BOLUS
INTRAVENOUS | Status: DC | PRN
  Administered 2022-01-21 (×3): 10 mg via INTRAVENOUS

## 2022-01-21 MED ORDER — CHLORHEXIDINE GLUCONATE 0.12 % MT SOLN
15.0000 mL | Freq: Once | OROMUCOSAL | Status: AC
  Administered 2022-01-21: 15 mL via OROMUCOSAL

## 2022-01-21 MED ORDER — BUPROPION HCL ER (XL) 150 MG PO TB24
150.0000 mg | ORAL_TABLET | Freq: Every day | ORAL | Status: DC
  Administered 2022-01-22 – 2022-01-24 (×3): 150 mg via ORAL
  Filled 2022-01-21 (×2): qty 1

## 2022-01-21 MED ORDER — PHENYLEPHRINE HCL-NACL 20-0.9 MG/250ML-% IV SOLN
INTRAVENOUS | Status: DC | PRN
  Administered 2022-01-21: 35 ug/min via INTRAVENOUS

## 2022-01-21 MED ORDER — CALCIUM CARBONATE 1250 (500 CA) MG PO TABS
1250.0000 mg | ORAL_TABLET | Freq: Every day | ORAL | Status: DC
  Administered 2022-01-22 – 2022-01-24 (×3): 1250 mg via ORAL
  Filled 2022-01-21 (×3): qty 1

## 2022-01-21 MED ORDER — PROPOFOL 500 MG/50ML IV EMUL
INTRAVENOUS | Status: DC | PRN
Start: 2022-01-21 — End: 2022-01-21
  Administered 2022-01-21: 100 ug/kg/min via INTRAVENOUS

## 2022-01-21 MED ORDER — ONDANSETRON HCL 4 MG/2ML IJ SOLN
INTRAMUSCULAR | Status: DC | PRN
Start: 2022-01-21 — End: 2022-01-21
  Administered 2022-01-21: 4 mg via INTRAVENOUS

## 2022-01-21 MED ORDER — MAGNESIUM OXIDE -MG SUPPLEMENT 400 (240 MG) MG PO TABS
400.0000 mg | ORAL_TABLET | Freq: Every day | ORAL | Status: DC
  Administered 2022-01-22 – 2022-01-24 (×3): 400 mg via ORAL
  Filled 2022-01-21 (×3): qty 1

## 2022-01-21 SURGICAL SUPPLY — 63 items
APL SKNCLS STERI-STRIP NONHPOA (GAUZE/BANDAGES/DRESSINGS)
BAG COUNTER SPONGE SURGICOUNT (BAG) IMPLANT
BAG SPEC THK2 15X12 ZIP CLS (MISCELLANEOUS) ×1
BAG SPNG CNTER NS LX DISP (BAG)
BAG ZIPLOCK 12X15 (MISCELLANEOUS) ×2 IMPLANT
BENZOIN TINCTURE PRP APPL 2/3 (GAUZE/BANDAGES/DRESSINGS) IMPLANT
BLADE SAG 18X100X1.27 (BLADE) ×2 IMPLANT
BLADE SURG SZ10 CARB STEEL (BLADE) ×4 IMPLANT
BNDG ELASTIC 6X5.8 VLCR STR LF (GAUZE/BANDAGES/DRESSINGS) ×3 IMPLANT
BOWL SMART MIX CTS (DISPOSABLE) IMPLANT
BSPLAT TIB 5D C 16NT STM RT (Knees) ×1 IMPLANT
CEMENT BONE R 1X40 (Cement) ×2 IMPLANT
COMP FEM CMT CR PS 56 RT (Joint) ×2 IMPLANT
COMPONENT FEM CMT CR PS 56 RT (Joint) IMPLANT
COOLER ICEMAN CLASSIC (MISCELLANEOUS) ×2 IMPLANT
COVER SURGICAL LIGHT HANDLE (MISCELLANEOUS) ×2 IMPLANT
CUFF TOURN SGL QUICK 34 (TOURNIQUET CUFF) ×2
CUFF TRNQT CYL 34X4.125X (TOURNIQUET CUFF) ×1 IMPLANT
DRAPE INCISE IOBAN 66X45 STRL (DRAPES) ×2 IMPLANT
DRAPE U-SHAPE 47X51 STRL (DRAPES) ×2 IMPLANT
DRSG PAD ABDOMINAL 8X10 ST (GAUZE/BANDAGES/DRESSINGS) ×3 IMPLANT
DURAPREP 26ML APPLICATOR (WOUND CARE) ×2 IMPLANT
ELECT BLADE TIP CTD 4 INCH (ELECTRODE) ×2 IMPLANT
ELECT REM PT RETURN 15FT ADLT (MISCELLANEOUS) ×2 IMPLANT
GAUZE SPONGE 4X4 12PLY STRL (GAUZE/BANDAGES/DRESSINGS) ×2 IMPLANT
GAUZE XEROFORM 1X8 LF (GAUZE/BANDAGES/DRESSINGS) ×1 IMPLANT
GLOVE BIO SURGEON STRL SZ7.5 (GLOVE) ×2 IMPLANT
GLOVE BIOGEL PI IND STRL 8 (GLOVE) ×2 IMPLANT
GLOVE BIOGEL PI INDICATOR 8 (GLOVE) ×2
GLOVE ECLIPSE 8.0 STRL XLNG CF (GLOVE) ×2 IMPLANT
GOWN STRL REUS W/ TWL XL LVL3 (GOWN DISPOSABLE) ×2 IMPLANT
GOWN STRL REUS W/TWL XL LVL3 (GOWN DISPOSABLE) ×4
HANDPIECE INTERPULSE COAX TIP (DISPOSABLE) ×2
HDLS TROCR DRIL PIN KNEE 75 (PIN) ×8
HOLDER FOLEY CATH W/STRAP (MISCELLANEOUS) IMPLANT
IMMOBILIZER KNEE 20 (SOFTGOODS) ×2
IMMOBILIZER KNEE 20 THIGH 36 (SOFTGOODS) ×1 IMPLANT
INSERT TIB AS PERS SZ CD 12 RT (Insert) ×1 IMPLANT
KIT TURNOVER KIT A (KITS) IMPLANT
NS IRRIG 1000ML POUR BTL (IV SOLUTION) ×2 IMPLANT
PACK TOTAL KNEE CUSTOM (KITS) ×2 IMPLANT
PAD COLD SHLDR WRAP-ON (PAD) ×2 IMPLANT
PADDING CAST COTTON 6X4 STRL (CAST SUPPLIES) ×3 IMPLANT
PIN DRILL HDLS TROCAR 75 4PK (PIN) IMPLANT
PROTECTOR NERVE ULNAR (MISCELLANEOUS) ×2 IMPLANT
SCREW FEMALE HEX FIX 25X2.5 (ORTHOPEDIC DISPOSABLE SUPPLIES) ×1 IMPLANT
SET HNDPC FAN SPRY TIP SCT (DISPOSABLE) ×1 IMPLANT
SET PAD KNEE POSITIONER (MISCELLANEOUS) ×2 IMPLANT
SPIKE FLUID TRANSFER (MISCELLANEOUS) IMPLANT
STAPLER VISISTAT 35W (STAPLE) IMPLANT
STEM POLY PAT PLY 29M KNEE (Knees) ×1 IMPLANT
STEM TIBIA 5 DEG SZ C R KNEE (Knees) IMPLANT
STRIP CLOSURE SKIN 1/2X4 (GAUZE/BANDAGES/DRESSINGS) IMPLANT
SUT MNCRL AB 4-0 PS2 18 (SUTURE) IMPLANT
SUT VIC AB 0 CT1 27 (SUTURE) ×6
SUT VIC AB 0 CT1 27XBRD ANBCTR (SUTURE) IMPLANT
SUT VIC AB 0 CT1 27XBRD ANTBC (SUTURE) ×1 IMPLANT
SUT VIC AB 1 CT1 36 (SUTURE) ×4 IMPLANT
SUT VIC AB 2-0 CT1 27 (SUTURE) ×4
SUT VIC AB 2-0 CT1 TAPERPNT 27 (SUTURE) ×2 IMPLANT
TIBIA STEM 5 DEG SZ C R KNEE (Knees) ×2 IMPLANT
TRAY FOLEY MTR SLVR 16FR STAT (SET/KITS/TRAYS/PACK) IMPLANT
WATER STERILE IRR 1000ML POUR (IV SOLUTION) ×4 IMPLANT

## 2022-01-21 NOTE — Anesthesia Procedure Notes (Signed)
Spinal ? ?Start time: 01/21/2022 8:26 AM ?End time: 01/21/2022 8:28 AM ?Reason for block: surgical anesthesia ?Staffing ?Performed: anesthesiologist  ?Anesthesiologist: Effie Berkshire, MD ?Preanesthetic Checklist ?Completed: patient identified, IV checked, site marked, risks and benefits discussed, surgical consent, monitors and equipment checked, pre-op evaluation and timeout performed ?Spinal Block ?Patient position: sitting ?Prep: DuraPrep and site prepped and draped ?Location: L3-4 ?Injection technique: single-shot ?Needle ?Needle type: Pencan  ?Needle gauge: 24 G ?Needle length: 10 cm ?Needle insertion depth: 10 cm ?Additional Notes ?Patient tolerated well. No immediate complications. ? ?Functioning IV was confirmed and monitors were applied. Sterile prep and drape, including hand hygiene and sterile gloves were used. The patient was positioned and the back was prepped. The skin was anesthetized with lidocaine. Free flow of clear CSF was obtained prior to injecting local anesthetic into the CSF. The spinal needle aspirated freely following injection. The needle was carefully withdrawn. The patient tolerated the procedure well. ? ? ? ? ?

## 2022-01-21 NOTE — Brief Op Note (Signed)
01/21/2022 ? ?9:55 AM ? ?PATIENT:  Elaine Navarro  76 y.o. female ? ?PRE-OPERATIVE DIAGNOSIS:  RIGHT KNEE OSTEOARTHRITIS ? ?POST-OPERATIVE DIAGNOSIS:  RIGHT KNEE OSTEOARTHRITIS ? ?PROCEDURE:  Procedure(s) with comments: ?RIGHT TOTAL KNEE ARTHROPLASTY (Right) - RFNA ? ?SURGEON:  Surgeon(s) and Role: ?   Mcarthur Rossetti, MD - Primary ? ?PHYSICIAN ASSISTANT: Benjiman Core, PA-C ? ?ANESTHESIA:   local, regional, and spinal ? ?EBL:  25 mL  ? ?COUNTS:  YES ? ?TOURNIQUET:   ?Total Tourniquet Time Documented: ?Thigh (Right) - 50 minutes ?Total: Thigh (Right) - 50 minutes ? ? ?DICTATION: .Other Dictation: Dictation Number [id number] ? ?PLAN OF CARE: Admit for overnight observation ? ?PATIENT DISPOSITION:  PACU - hemodynamically stable. ?  ?Delay start of Pharmacological VTE agent (>24hrs) due to surgical blood loss or risk of bleeding: no ? ?

## 2022-01-21 NOTE — Transfer of Care (Signed)
Immediate Anesthesia Transfer of Care Note ? ?Patient: Elaine Navarro ? ?Procedure(s) Performed: RIGHT TOTAL KNEE ARTHROPLASTY (Right: Knee) ? ?Patient Location: PACU ? ?Anesthesia Type:MAC ? ?Level of Consciousness: awake, alert  and oriented ? ?Airway & Oxygen Therapy: Patient Spontanous Breathing and Patient connected to face mask oxygen ? ?Post-op Assessment: Report given to RN and Post -op Vital signs reviewed and stable ? ?Post vital signs: Reviewed and stable ? ?Last Vitals:  ?Vitals Value Taken Time  ?BP    ?Temp    ?Pulse 69 01/21/22 1020  ?Resp 21 01/21/22 1020  ?SpO2 100 % 01/21/22 1020  ?Vitals shown include unvalidated device data. ? ?Last Pain:  ?Vitals:  ? 01/21/22 0627  ?TempSrc:   ?PainSc: 4   ?   ? ?Patients Stated Pain Goal: 4 (01/21/22 4327) ? ?Complications: No notable events documented. ?

## 2022-01-21 NOTE — Discharge Instructions (Addendum)

## 2022-01-21 NOTE — Evaluation (Signed)
Physical Therapy Evaluation ?Patient Details ?Name: Elaine Navarro ?MRN: [id number] ?DOB: August 22, 1946 ?Today's Date: 01/21/2022 ? ?History of Present Illness ? Pt s/p R TKR and with hx of oseoporosis  ?Clinical Impression ? Pt s/p R TKR and presents with decreased R LE strength/ROM and post op pain limiting functional mobility.  Pt should progress to dc home with family assist. ?   ? ?Recommendations for follow up therapy are one component of a multi-disciplinary discharge planning process, led by the attending physician.  Recommendations may be updated based on patient status, additional functional criteria and insurance authorization. ? ?Follow Up Recommendations Follow physician's recommendations for discharge plan and follow up therapies ? ?  ?Assistance Recommended at Discharge Frequent or constant Supervision/Assistance  ?Patient can return home with the following ? A little help with walking and/or transfers;A little help with bathing/dressing/bathroom;Assist for transportation;Assistance with cooking/housework ? ?  ?Equipment Recommendations Rolling walker (2 wheels)  ?Recommendations for Other Services ?    ?  ?Functional Status Assessment Patient has had a recent decline in their functional status and demonstrates the ability to make significant improvements in function in a reasonable and predictable amount of time.  ? ?  ?Precautions / Restrictions Precautions ?Precautions: Knee;Fall ?Restrictions ?Weight Bearing Restrictions: No ?RLE Weight Bearing: Weight bearing as tolerated  ? ?  ? ?Mobility ? Bed Mobility ?Overal bed mobility: Needs Assistance ?Bed Mobility: Supine to Sit ?  ?  ?Supine to sit: Min assist ?  ?  ?General bed mobility comments: Increased time with cues for sequence and use of R LE to self assist ?  ? ?Transfers ?Overall transfer level: Needs assistance ?Equipment used: Rolling walker (2 wheels) ?Transfers: Sit to/from Stand ?Sit to Stand: Min assist ?  ?  ?  ?  ?  ?General transfer  comment: cues for LE management and use of UEs to self assist ?  ? ?Ambulation/Gait ?Ambulation/Gait assistance: Min assist ?Gait Distance (Feet): 62 Feet ?Assistive device: Rolling walker (2 wheels) ?Gait Pattern/deviations: Step-to pattern, Decreased step length - right, Decreased step length - left, Shuffle, Trunk flexed ?Gait velocity: decr ?  ?  ?General Gait Details: cues for sequence, posture and position from RW ? ?Stairs ?  ?  ?  ?  ?  ? ?Wheelchair Mobility ?  ? ?Modified Rankin (Stroke Patients Only) ?  ? ?  ? ?Balance Overall balance assessment: Needs assistance ?Sitting-balance support: No upper extremity supported, Feet supported ?Sitting balance-Leahy Scale: Fair ?  ?  ?Standing balance support: Single extremity supported ?Standing balance-Leahy Scale: Poor ?  ?  ?  ?  ?  ?  ?  ?  ?  ?  ?  ?  ?   ? ? ? ?Pertinent Vitals/Pain Pain Assessment ?Pain Assessment: 0-10 ?Pain Score: 6  ?Pain Location: L knee ?Pain Descriptors / Indicators: Aching, Sore, Grimacing, Guarding ?Pain Intervention(s): Limited activity within patient's tolerance, Monitored during session, Premedicated before session, Ice applied  ? ? ?Home Living Family/patient expects to be discharged to:: Private residence ?Living Arrangements: Spouse/significant other ?Available Help at Discharge: Family;Available 24 hours/day ?Type of Home: House ?Home Access: Elevator ?  ?  ?  ?Home Layout: One level ?Home Equipment: BSC/3in1;Cane - single point ?   ?  ?Prior Function Prior Level of Function : Independent/Modified Independent ?  ?  ?  ?  ?  ?  ?  ?  ?  ? ? ?Hand Dominance  ?   ? ?  ?Extremity/Trunk Assessment  ? Upper  Extremity Assessment ?Upper Extremity Assessment: Overall WFL for tasks assessed ?  ? ?Lower Extremity Assessment ?Lower Extremity Assessment: LLE deficits/detail ?  ? ?Cervical / Trunk Assessment ?Cervical / Trunk Assessment: Normal  ?Communication  ? Communication: No difficulties  ?Cognition Arousal/Alertness:  Awake/alert ?Behavior During Therapy: [facility] for tasks assessed/performed ?Overall Cognitive Status: Within Functional Limits for tasks assessed ?  ?  ?  ?  ?  ?  ?  ?  ?  ?  ?  ?  ?  ?  ?  ?  ?  ?  ?  ? ?  ?General Comments   ? ?  ?Exercises Total Joint Exercises ?Ankle Circles/Pumps: AROM, Both, Supine, 15 reps  ? ?Assessment/Plan  ?  ?PT Assessment Patient needs continued PT services  ?PT Problem List Decreased strength;Decreased range of motion;Decreased activity tolerance;Decreased balance;Decreased mobility;Decreased knowledge of use of DME;Pain ? ?   ?  ?PT Treatment Interventions DME instruction;Gait training;Functional mobility training;Therapeutic activities;Therapeutic exercise;Patient/family education   ? ?PT Goals (Current goals can be found in the Care Plan section)  ?Acute Rehab PT Goals ?Patient Stated Goal: Be able to ball room dance in the future ?PT Goal Formulation: With patient ?Time For Goal Achievement: 01/28/22 ?Potential to Achieve Goals: Good ? ?  ?Frequency 7X/week ?  ? ? ?Co-evaluation   ?  ?  ?  ?  ? ? ?  ?AM-PAC PT "6 Clicks" Mobility  ?Outcome Measure Help needed turning from your back to your side while in a flat bed without using bedrails?: A Little ?Help needed moving from lying on your back to sitting on the side of a flat bed without using bedrails?: A Little ?Help needed moving to and from a bed to a chair (including a wheelchair)?: A Little ?Help needed standing up from a chair using your arms (e.g., wheelchair or bedside chair)?: A Little ?Help needed to walk in hospital room?: A Little ?Help needed climbing 3-5 steps with a railing? : A Lot ?6 Click Score: 17 ? ?  ?End of Session Equipment Utilized During Treatment: Gait belt;Right knee immobilizer ?Activity Tolerance: Patient tolerated treatment well ?Patient left: in chair;with call bell/phone within reach;with family/visitor present ?Nurse Communication: Mobility status ?PT Visit Diagnosis: Difficulty in walking, not elsewhere  classified (R26.2) ?  ? ?Time: 1017-5102 ?PT Time Calculation (min) (ACUTE ONLY): 33 min ? ? ?Charges:   PT Evaluation ?$PT Eval Low Complexity: 1 Low ?PT Treatments ?$Gait Training: 8-22 mins ?  ?   ? ? ?Debe Coder PT ?Acute Rehabilitation Services ?Pager [phone] ?Office [phone] ? ? ?Donato Studley ?01/21/2022, 4:39 PM ? ?

## 2022-01-21 NOTE — Op Note (Signed)
NAME: Elaine Navarro, Elaine Navarro. ?MEDICAL RECORD NO: [id number] ?ACCOUNT NO: [id number] ?DATE OF BIRTH: 1946-08-07 ?FACILITY: [facility] ?LOCATION: [facility] ?PHYSICIAN: Lind Guest. Ninfa Linden, MD ? ?Operative Report  ? ?DATE OF PROCEDURE: 01/21/2022 ? ?PREOPERATIVE DIAGNOSIS:  Primary osteoarthritis and degenerative joint disease, right knee. ? ?POSTOPERATIVE DIAGNOSIS:  Primary osteoarthritis and degenerative joint disease, right knee. ? ?PROCEDURE:  Right total knee arthroplasty. ? ?IMPLANTS:  Biomet/Zimmer cemented Persona knee system with size right narrow CR femur, size C right tibial tray, medial congruent 12 mm thickness right polyethylene insert, size 29 patellar button. ? ?SURGEON:  Lind Guest. Ninfa Linden, MD ? ?ASSISTANT:  Benjiman Core, PA-C ? ?ANESTHESIA:   ?1.  Right lower extremity adductor canal block. ?2.  Spinal. ?3.  Local with 0.25% Marcaine with epinephrine around the arthrotomy. ? ?TOURNIQUET TIME:  Under 1 hour. ? ?ANTIBIOTICS:  2 g IV Ancef. ? ?ESTIMATED BLOOD LOSS:  Less than 100 mL ? ?COMPLICATIONS:  None. ? 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  She has had arthroscopic intervention by someone else on that knee as well as multiple steroid and hyaluronic acid  ?injections and even ablations around the nerves.  Given the arthritis she has in her knee and how it is affecting her dancing and her pain she does wish to proceed with a knee replacement, knowing that there is certainly a type of surgery.  She  ?understands there is risk of acute blood loss anemia, continued pain, nerve or vessel injury, fracture, infection, DVT and implant failure.  She understands our goals are to decrease pain, improve mobility and overall improve quality of life. ? ?DESCRIPTION OF PROCEDURE:  After informed consent was obtained, appropriate right knee was marked and adductor canal block was obtained in the right lower extremity in the  holding room.  She was then brought to the operating room and sat up on the  ?operating table where spinal anesthesia was obtained.  She was laid in supine position on the operating table.  Foley catheter was placed and a nonsterile tourniquet was placed around her upper right thigh.  Her right thigh, knee, leg, ankle and foot  ?were prepped and draped with DuraPrep and sterile drapes.  Timeout was called.  She was identified as correct patient, correct right knee.  We then used an Esmarch to wrap that leg and tourniquet was inflated to 300 mm of pressure.  I made a direct  ?midline incision over the patella and carried this proximally and distally, dissected down the knee joint, carried out a medial parapatellar arthrotomy.  With the knee in a flexed position, we removed osteophytes as well as remnants of the ACL, medial  ?and lateral meniscus.  We made our proximal tibia cut for taking 10 mm off the high side, correcting varus and valgus and a 3-degree slope.  We backed this down 2 more millimeters based on the size and thickness of our cut.  We then used an  ?intramedullary guide for making our distal femoral cut, setting this for a right knee at 5 degrees externally rotated and a 10 mm distal femoral cut.  We made that cut without difficulty, and brought the knee back down to full extension and achieved full ? extension with a 10 mm extension block.  We then went back to the femur and we put the femoral sizing guide based off the epicondylar axis.  Based off this, we chose a size 5  femur.  We put a 4-in-1 cutting block for a size 5 femur, made our anterior  ?and posterior cuts, followed by our chamfer cuts.  We then backed the tibia and chose a size C right tibial tray for coverage of the tibial plateau, we set the rotation off the tibial tubercle and the femur. We made our keel punch and drill hole off of  ?this.  With the trial size right C tibia, we trialed a 5 right femur.  We went with a 10 mm medial  congruent, fixed bearing, right polyethylene insert and went up to a 12 mm insert and for appreciated the stability and good range of motion.  We then made ? our patellar cut and drilled three holes for size 29 patellar button.  All trial instrumentation in the operative theater several cycles of motion I was pleased with range of motion and stability.  We then removed all trial instrumentation from the knee ? and irrigated the knee with normal saline solution.  We dried the knee real well and with the knee in a flexed position mixed our cement and then cemented our Biomet Zimmer right C tibial tray followed by placing our right size 5 narrow CR femur.  We  ?placed our 12 mm medial congruent fixed bearing polyethylene insert and cemented our size 29 patellar button.  I then held the knee fully extended and compressed while the cement hardened.  Once it hardened, we did place Marcaine with epinephrine around  ?the arthrotomy.  We then let the tourniquet down.  Hemostasis was obtained with electrocautery.  We closed the arthrotomy with interrupted #1 Vicryl suture followed by 0 Vicryl to close the deep tissue and 2-0 Vicryl to close the subcutaneous tissue.   ?The skin was closed with staples.  A well-padded sterile dressing was applied.  She was taken to recovery room in stable condition with all final counts being correct.  No complications noted.  Of note, Benjiman Core, PA-C did assist in entire case from  ?beginning to end.  His assistance was crucial for managing soft tissues as well as helping guide implant placement.  He was directly involved with a layered closure of the wound.  His assistance was medically necessary. ? ? ?PUS ?D: 01/21/2022 9:54:31 am T: 01/21/2022 12:57:00 pm  ?JOB: [id number]/ [id number]  ?

## 2022-01-21 NOTE — Anesthesia Postprocedure Evaluation (Signed)
Anesthesia Post Note ? ?Patient: [name] ? ?Procedure(s) Performed: RIGHT TOTAL KNEE ARTHROPLASTY (Right: Knee) ? ?  ? ?Patient location during evaluation: PACU ?Anesthesia Type: Spinal ?Level of consciousness: oriented and awake and alert ?Pain management: pain level controlled ?Vital Signs Assessment: post-procedure vital signs reviewed and stable ?Respiratory status: spontaneous breathing, respiratory function stable and patient connected to nasal cannula oxygen ?Cardiovascular status: blood pressure returned to baseline and stable ?Postop Assessment: no headache, no backache and no apparent nausea or vomiting ?Anesthetic complications: no ? ? ?No notable events documented. ? ?Last Vitals:  ?Vitals:  ? 01/21/22 1130 01/21/22 1141  ?BP:  (!) 124/59  ?Pulse:  63  ?Resp:  15  ?Temp: 36.5 ?C 36.5 ?C  ?SpO2:  100%  ?  ?Last Pain:  ?Vitals:  ? 01/21/22 1205  ?TempSrc:   ?PainSc: 2   ? ? ?  ?  ?  ?  ?  ?  ? ?Elaine Navarro ? ? ? ? ?

## 2022-01-21 NOTE — Plan of Care (Signed)

## 2022-01-21 NOTE — Anesthesia Procedure Notes (Signed)
Anesthesia Regional Block: Adductor canal block  ? ?Pre-Anesthetic Checklist: , timeout performed,  Correct Patient, Correct Site, Correct Laterality,  Correct Procedure, Correct Position, site marked,  Risks and benefits discussed,  Surgical consent,  Pre-op evaluation,  At surgeon's request and post-op pain management ? ?Laterality: Right ? ?Prep: chloraprep     ?  ?Needles:  ?Injection technique: Single-shot ? ?Needle Type: Echogenic Stimulator Needle   ? ? ?Needle Length: 9cm  ?Needle Gauge: 21  ? ? ? ?Additional Needles: ? ? ?Procedures:,,,, ultrasound used (permanent image in chart),,    ?Narrative:  ?Start time: 01/21/2022 8:05 AM ?End time: 01/21/2022 8:10 AM ?Injection made incrementally with aspirations every 5 mL. ? ?Performed by: Personally  ?Anesthesiologist: Effie Berkshire, MD ? ?Additional Notes: ?Patient tolerated the procedure well. Local anesthetic introduced in an incremental fashion under minimal resistance after negative aspirations. No paresthesias were elicited. After completion of the procedure, no acute issues were identified and patient continued to be monitored by RN.  ? ? ? ? ? ?

## 2022-01-21 NOTE — Care Plan (Signed)
Ortho Bundle Case Management Note ? ?Patient Details  ?Name: Elaine Navarro ?MRN: [id number] ?Date of Birth: [birth year] ? ?                ?[facility] RNCM call to patient to discuss her upcoming Right total knee arthroplasty on 01/21/22 with Dr. Ninfa Linden. She is an Ortho bundle patient through [facility] and is agreeable to case management. She lives with her spouse, who will be assisting after surgery. She has a 3in1/BSC at home, but will need a RW. She has a walker without wheels. Can assist with this. Anticipate HHPT will be needed after a short hospital stay. Referral to [facility] after choice provided. She wishes to attend OPPT here at [facility] when appropriate. We discussed issues she has with sciatica and that it is currently causing her much pain on the same side as her upcoming knee replacement. CM will convey this to HHPT as well. Reviewed all post op care instructions. Will continue to follow for needs. ? ? ?DME Arranged:  Walker rolling ?DME Agency:  [employer] ? ?HH Arranged:  PT ?[place] Agency:  [place] ? ?Additional Comments: ?Please contact me with any questions of if this plan should need to change. ? ?Jamse Arn, RN, BSN, [employer]  [phone] ?01/21/2022, 1:55 PM ?  ?

## 2022-01-21 NOTE — Progress Notes (Signed)
AssistedDr. Smith Robert with right, adductor canal block. Side rails up, monitors on throughout procedure. See vital signs in flow sheet. Tolerated Procedure well. ? ?

## 2022-01-21 NOTE — Interval H&P Note (Signed)
History and Physical Interval Note: The patient is here today for a right knee replacement to treat her right knee osteoarthritis.  She has failed all forms conservative treatment.  There has been no acute or interval change in her medical status.  Please see H&P.  The risks and benefits of surgery been explained in detail and informed consent is obtained.  The right knee has been marked. ? ?01/21/2022 ?7:00 AM ? ?Elaine Navarro  has presented today for surgery, with the diagnosis of RIGHT KNEE OSTEOARTHRITIS.  The various methods of treatment have been discussed with the patient and family. After consideration of risks, benefits and other options for treatment, the patient has consented to  Procedure(s) with comments: ?RIGHT TOTAL KNEE ARTHROPLASTY (Right) - RFNA as a surgical intervention.  The patient's history has been reviewed, patient examined, no change in status, stable for surgery.  I have reviewed the patient's chart and labs.  Questions were answered to the patient's satisfaction.   ? ? ?Mcarthur Rossetti ? ? ?

## 2022-01-22 LAB — CBC
HCT: 29.8 % — ABNORMAL LOW (ref 36.0–46.0)
Hemoglobin: 10 g/dL — ABNORMAL LOW (ref 12.0–15.0)
MCH: 32.9 pg (ref 26.0–34.0)
MCHC: 33.6 g/dL (ref 30.0–36.0)
MCV: 98 fL (ref 80.0–100.0)
Platelets: 191 10*3/uL (ref 150–400)
RBC: 3.04 MIL/uL — ABNORMAL LOW (ref 3.87–5.11)
RDW: 12.5 % (ref 11.5–15.5)
WBC: 6.5 10*3/uL (ref 4.0–10.5)
nRBC: 0 % (ref 0.0–0.2)

## 2022-01-22 LAB — BASIC METABOLIC PANEL
Anion gap: 6 (ref 5–15)
BUN: 10 mg/dL (ref 8–23)
CO2: 22 mmol/L (ref 22–32)
Calcium: 6.9 mg/dL — ABNORMAL LOW (ref 8.9–10.3)
Chloride: 105 mmol/L (ref 98–111)
Creatinine, Ser: 0.53 mg/dL (ref 0.44–1.00)
GFR, Estimated: >60 mL/min (ref >60)
Glucose, Bld: 125 mg/dL — ABNORMAL HIGH (ref 70–99)
Potassium: 3.5 mmol/L (ref 3.5–5.1)
Sodium: 133 mmol/L — ABNORMAL LOW (ref 135–145)

## 2022-01-22 MED ORDER — TIZANIDINE HCL 4 MG PO TABS: 4.0000 mg | ORAL_TABLET | Freq: Four times a day (QID) | ORAL | 0 refills | Status: DC | PRN

## 2022-01-22 MED ORDER — OXYCODONE HCL 5 MG PO TABS: 5.0000 mg | ORAL_TABLET | ORAL | 0 refills | Status: DC | PRN

## 2022-01-22 MED ORDER — ASPIRIN 81 MG PO CHEW
81.0000 mg | CHEWABLE_TABLET | Freq: Two times a day (BID) | ORAL | Status: DC
  Administered 2022-01-22 – 2022-01-24 (×4): 81 mg via ORAL
  Filled 2022-01-22 (×4): qty 1

## 2022-01-22 MED ORDER — KETOROLAC TROMETHAMINE 15 MG/ML IJ SOLN
7.5000 mg | Freq: Four times a day (QID) | INTRAMUSCULAR | Status: AC
  Administered 2022-01-22 – 2022-01-23 (×3): 7.5 mg via INTRAVENOUS
  Filled 2022-01-22 (×3): qty 1

## 2022-01-22 MED ORDER — TIZANIDINE HCL 4 MG PO TABS
4.0000 mg | ORAL_TABLET | Freq: Four times a day (QID) | ORAL | Status: DC | PRN
  Administered 2022-01-22 – 2022-01-24 (×7): 4 mg via ORAL
  Filled 2022-01-22 (×7): qty 1

## 2022-01-22 MED ORDER — ASPIRIN 81 MG PO CHEW
81.0000 mg | CHEWABLE_TABLET | Freq: Two times a day (BID) | ORAL | 1 refills | Status: DC
Start: 2022-01-22 — End: 2022-05-19

## 2022-01-22 MED ORDER — ONDANSETRON HCL 4 MG PO TABS: 4.0000 mg | ORAL_TABLET | Freq: Three times a day (TID) | ORAL | 0 refills | Status: DC | PRN

## 2022-01-22 NOTE — Plan of Care (Signed)
?  Problem: Pain Management: Goal: Pain level will decrease with appropriate interventions Outcome: Progressing   Problem: Skin Integrity: Goal: Will show signs of wound healing Outcome: Progressing   Problem: Activity: Goal: Ability to avoid complications of mobility impairment will improve Outcome: Progressing   

## 2022-01-22 NOTE — Progress Notes (Signed)
Physical Therapy Treatment ?Patient Details ?Name: Elaine Navarro ?MRN: [id number] ?DOB: Nov 27, 1945 ?Today's Date: 01/22/2022 ? ? ?History of Present Illness Pt s/p R TKR and with hx of oseoporosis ? ?  ?PT Comments  ? ? Pt cooperative but requiring increased time and assist for all tasks 2* ongoing pain/spasms.  RN aware.   ?Recommendations for follow up therapy are one component of a multi-disciplinary discharge planning process, led by the attending physician.  Recommendations may be updated based on patient status, additional functional criteria and insurance authorization. ? ?Follow Up Recommendations ? Follow physician's recommendations for discharge plan and follow up therapies ?  ?  ?Assistance Recommended at Discharge Frequent or constant Supervision/Assistance  ?Patient can return home with the following A little help with walking and/or transfers;A little help with bathing/dressing/bathroom;Assist for transportation;Assistance with cooking/housework ?  ?Equipment Recommendations ? Rolling walker (2 wheels)  ?  ?Recommendations for Other Services   ? ? ?  ?Precautions / Restrictions Precautions ?Precautions: Knee;Fall ?Restrictions ?Weight Bearing Restrictions: No ?RLE Weight Bearing: Weight bearing as tolerated  ?  ? ?Mobility ? Bed Mobility ?Overal bed mobility: Needs Assistance ?Bed Mobility: Supine to Sit ?  ?  ?Supine to sit: Min assist, Mod assist ?  ?  ?General bed mobility comments: Increased time with cues for sequence and use of R LE to self assist ?  ? ?Transfers ?Overall transfer level: Needs assistance ?Equipment used: Rolling walker (2 wheels) ?Transfers: Sit to/from Stand, Bed to chair/wheelchair/BSC ?Sit to Stand: Min assist ?  ?Step pivot transfers: Min assist, +2 safety/equipment, From elevated surface ?  ?  ?  ?General transfer comment: cues for LE management and use of UEs to self assist ?  ? ?Ambulation/Gait ?  ?  ?  ?  ?Gait velocity: decr ?  ?  ?General Gait Details: Pt tolerated  step/pvt bed to [facility] to recliner only ? ? ?Stairs ?  ?  ?  ?  ?  ? ? ?Wheelchair Mobility ?  ? ?Modified Rankin (Stroke Patients Only) ?  ? ? ?  ?Balance Overall balance assessment: Needs assistance ?Sitting-balance support: No upper extremity supported, Feet supported ?Sitting balance-Leahy Scale: Fair ?  ?  ?Standing balance support: Single extremity supported ?Standing balance-Leahy Scale: Poor ?  ?  ?  ?  ?  ?  ?  ?  ?  ?  ?  ?  ?  ? ?  ?Cognition Arousal/Alertness: Awake/alert ?Behavior During Therapy: [facility] for tasks assessed/performed ?Overall Cognitive Status: Within Functional Limits for tasks assessed ?  ?  ?  ?  ?  ?  ?  ?  ?  ?  ?  ?  ?  ?  ?  ?  ?  ?  ?  ? ?  ?Exercises   ? ?  ?General Comments   ?  ?  ? ?Pertinent Vitals/Pain Pain Assessment ?Pain Assessment: 0-10 ?Pain Score: 10-Worst pain ever ?Pain Location: L knee ?Pain Descriptors / Indicators: Aching, Sore, Grimacing, Guarding, Spasm ?Pain Intervention(s): Limited activity within patient's tolerance, Monitored during session, Premedicated before session, Ice applied  ? ? ?Home Living   ?  ?  ?  ?  ?  ?  ?  ?  ?  ?   ?  ?Prior Function    ?  ?  ?   ? ?PT Goals (current goals can now be found in the care plan section) Acute Rehab PT Goals ?Patient Stated Goal: Be able to ball room dance  in the future ?PT Goal Formulation: With patient ?Time For Goal Achievement: 01/28/22 ?Potential to Achieve Goals: Good ?Progress towards PT goals: Progressing toward goals ? ?  ?Frequency ? ? ? 7X/week ? ? ? ?  ?PT Plan Current plan remains appropriate  ? ? ?Co-evaluation   ?  ?  ?  ?  ? ?  ?AM-PAC PT "6 Clicks" Mobility   ?Outcome Measure ? Help needed turning from your back to your side while in a flat bed without using bedrails?: A Lot ?Help needed moving from lying on your back to sitting on the side of a flat bed without using bedrails?: A Lot ?Help needed moving to and from a bed to a chair (including a wheelchair)?: A Lot ?Help needed standing up from a chair  using your arms (e.g., wheelchair or bedside chair)?: A Lot ?Help needed to walk in hospital room?: Total ?Help needed climbing 3-5 steps with a railing? : Total ?6 Click Score: 10 ? ?  ?End of Session Equipment Utilized During Treatment: Gait belt;Right knee immobilizer ?Activity Tolerance: Patient tolerated treatment well ?Patient left: in chair;with call bell/phone within reach;with family/visitor present ?Nurse Communication: Mobility status ?PT Visit Diagnosis: Difficulty in walking, not elsewhere classified (R26.2) ?  ? ? ?Time: 3557-3220 ?PT Time Calculation (min) (ACUTE ONLY): 28 min ? ?Charges:  $Therapeutic Activity: 23-37 mins          ?          ? ?Debe Coder PT ?Acute Rehabilitation Services ?Pager [phone] ?Office [phone] ? ? ? ?Justine Dines ?01/22/2022, 2:16 PM ? ?

## 2022-01-22 NOTE — Progress Notes (Signed)
Subjective: ?1 Day Post-Op Procedure(s) (LRB): ?RIGHT TOTAL KNEE ARTHROPLASTY (Right) ?Patient reports pain as severe.   ? ?Objective: ?Vital signs in last 24 hours: ?Temp:  [97.5 ?F (36.4 ?C)-98.2 ?F (36.8 ?C)] 98.2 ?F (36.8 ?C) (05/13 8527) ?Pulse Rate:  [59-76] 75 (05/13 0939) ?Resp:  [15-18] 18 (05/13 0939) ?BP: (105-124)/(48-66) 105/48 (05/13 7824) ?SpO2:  [99 %-100 %] 99 % (05/13 0939) ? ?Intake/Output from previous day: ?05/12 0701 - 05/13 0700 ?In: 4186.8 [P.O.:1140; I.V.:2846.8; IV Piggyback:200] ?Out: 2353 [Urine:3700; Blood:25] ?Intake/Output this shift: ?Total I/O ?In: 717.7 [P.O.:600; I.V.:117.7] ?Out: -  ? ?Recent Labs  ?  01/22/22 ?0719  ?HGB 10.0*  ? ?Recent Labs  ?  01/22/22 ?0719  ?WBC 6.5  ?RBC 3.04*  ?HCT 29.8*  ?PLT 191  ? ?Recent Labs  ?  01/22/22 ?0719  ?NA 133*  ?K 3.5  ?CL 105  ?CO2 22  ?BUN 10  ?CREATININE 0.53  ?GLUCOSE 125*  ?CALCIUM 6.9*  ? ?No results for input(s): LABPT, INR in the last 72 hours. ? ?Sensation intact distally ?Intact pulses distally ?Dorsiflexion/Plantar flexion intact ?Incision: dressing C/D/I ?No cellulitis present ?Compartment soft ? ? ?Assessment/Plan: ?1 Day Post-Op Procedure(s) (LRB): ?RIGHT TOTAL KNEE ARTHROPLASTY (Right) ?Up with therapy ?Plan for discharge tomorrow ?Discharge home with home health ? ? ? ? ? ?Mcarthur Rossetti ?01/22/2022, 11:34 AM ? ?

## 2022-01-22 NOTE — TOC Transition Note (Signed)
Transition of Care (TOC) - CM/SW Discharge Note ? ? ?Patient Details  ?Name: Elaine Navarro ?MRN: [id number] ?Date of Birth: 1946-06-16 ? ?Transition of Care (TOC) CM/SW Contact:  ?Ross Ludwig, LCSW ?Phone Number: ?01/22/2022, 11:56 AM ? ? ?Clinical Narrative:    ? ?CSW spoke to patient to discuss equipment needs.  Per patient she needs a rolling walker.  CSW contacted Jasmine at [place], and she will arrange to have it delivered to the room before she discharges.  Patient has been prearranged with [facility] for home health.  CSW signing off, please reconsult if social work needs arise. ? ? ?Final next level of care: [place] ?Barriers to Discharge: Barriers Resolved ? ? ?Patient Goals and CMS Choice ?Patient states their goals for this hospitalization and ongoing recovery are:: To return back home with hom health. ?CMS Medicare.gov Compare Post Acute Care list provided to:: Patient ?Choice offered to / list presented to : Patient ? ?Discharge Placement ?  ?           ?  ?  ?  ?  ? ?Discharge Plan and Services ?  ?  ?           ?DME Arranged: Walker rolling ?DME Agency: [employer] ?Date DME Agency Contacted: 01/22/22 ?Time DME Agency Contacted: 1100 ?Representative spoke with at DME Agency: Delana Meyer ?HH Arranged: PT ?[place] Agency: [place] ?Date HH Agency Contacted: 01/17/22 ?Time [place]: [id number] ?Representative spoke with at [place]: Romie Jumper ? ?Social Determinants of Health (SDOH) Interventions ?  ? ? ?Readmission Risk Interventions ?   ? View : No data to display.  ?  ?  ?  ? ? ? ? ? ?

## 2022-01-23 DIAGNOSIS — Z91040 Latex allergy status: Secondary | ICD-10-CM | POA: Diagnosis not present

## 2022-01-23 DIAGNOSIS — M1711 Unilateral primary osteoarthritis, right knee: Secondary | ICD-10-CM | POA: Diagnosis present

## 2022-01-23 DIAGNOSIS — Z79899 Other long term (current) drug therapy: Secondary | ICD-10-CM | POA: Diagnosis not present

## 2022-01-23 DIAGNOSIS — Z803 Family history of malignant neoplasm of breast: Secondary | ICD-10-CM | POA: Diagnosis not present

## 2022-01-23 DIAGNOSIS — Z885 Allergy status to narcotic agent status: Secondary | ICD-10-CM | POA: Diagnosis not present

## 2022-01-23 DIAGNOSIS — M810 Age-related osteoporosis without current pathological fracture: Secondary | ICD-10-CM | POA: Diagnosis present

## 2022-01-23 DIAGNOSIS — Z87891 Personal history of nicotine dependence: Secondary | ICD-10-CM | POA: Diagnosis not present

## 2022-01-23 DIAGNOSIS — G8929 Other chronic pain: Secondary | ICD-10-CM | POA: Diagnosis present

## 2022-01-23 DIAGNOSIS — I722 Aneurysm of renal artery: Secondary | ICD-10-CM | POA: Diagnosis present

## 2022-01-23 DIAGNOSIS — Z888 Allergy status to other drugs, medicaments and biological substances status: Secondary | ICD-10-CM | POA: Diagnosis not present

## 2022-01-23 DIAGNOSIS — Z832 Family history of diseases of the blood and blood-forming organs and certain disorders involving the immune mechanism: Secondary | ICD-10-CM | POA: Diagnosis not present

## 2022-01-23 DIAGNOSIS — Z808 Family history of malignant neoplasm of other organs or systems: Secondary | ICD-10-CM | POA: Diagnosis not present

## 2022-01-23 DIAGNOSIS — I10 Essential (primary) hypertension: Secondary | ICD-10-CM | POA: Diagnosis present

## 2022-01-23 DIAGNOSIS — R918 Other nonspecific abnormal finding of lung field: Secondary | ICD-10-CM | POA: Diagnosis present

## 2022-01-23 DIAGNOSIS — Z886 Allergy status to analgesic agent status: Secondary | ICD-10-CM | POA: Diagnosis not present

## 2022-01-23 DIAGNOSIS — Z8249 Family history of ischemic heart disease and other diseases of the circulatory system: Secondary | ICD-10-CM | POA: Diagnosis not present

## 2022-01-23 LAB — URINALYSIS, ROUTINE W REFLEX MICROSCOPIC
Bacteria, UA: NONE SEEN
Bilirubin Urine: NEGATIVE
Glucose, UA: NEGATIVE mg/dL
Ketones, ur: NEGATIVE mg/dL
Nitrite: NEGATIVE
Protein, ur: NEGATIVE mg/dL
Specific Gravity, Urine: 1.003 — ABNORMAL LOW (ref 1.005–1.030)
pH: 6 (ref 5.0–8.0)

## 2022-01-23 MED ORDER — SENNA 8.6 MG PO TABS: 1.0000 | ORAL_TABLET | Freq: Every evening | ORAL | Status: DC | PRN

## 2022-01-23 MED ORDER — SULFAMETHOXAZOLE-TRIMETHOPRIM 800-160 MG PO TABS
1.0000 | ORAL_TABLET | Freq: Two times a day (BID) | ORAL | Status: DC
  Administered 2022-01-23 – 2022-01-24 (×2): 1 via ORAL
  Filled 2022-01-23 (×2): qty 1

## 2022-01-23 NOTE — Plan of Care (Signed)
?  Problem: Pain Management: ?Goal: Pain level will decrease with appropriate interventions ?Outcome: Progressing ?  ?Problem: Skin Integrity: ?Goal: Will show signs of wound healing ?Outcome: Progressing ?  ?Problem: Activity: ?Goal: Ability to avoid complications of mobility impairment will improve ?Outcome: Progressing ?  ?Problem: Clinical Measurements: ?Goal: Ability to maintain clinical measurements within normal limits will improve ?Outcome: Progressing ?  ?

## 2022-01-23 NOTE — Progress Notes (Signed)
Dr. Sammuel Hines notified of pt ua results and also that pt is constipated as well. [place] ordered and will be given tonight. Md wants to monitor pt urine results for now and not start antibiotics.  ?

## 2022-01-23 NOTE — Progress Notes (Signed)
Physical Therapy Treatment ?Patient Details ?Name: Elaine Navarro ?MRN: [id number] ?DOB: 15-Dec-1945 ?Today's Date: 01/23/2022 ? ? ?History of Present Illness Pt s/p R TKR and with hx of oseoporosis ? ?  ?PT Comments  ? ? Pt very cooperative and with marked improvement in pain control and activity tolerance.  Pt up to ambulate ltd distance in hall, to bathroom for toileting and hygiene at sink, and initiated therex program.     ?Recommendations for follow up therapy are one component of a multi-disciplinary discharge planning process, led by the attending physician.  Recommendations may be updated based on patient status, additional functional criteria and insurance authorization. ? ?Follow Up Recommendations ? Follow physician's recommendations for discharge plan and follow up therapies ?  ?  ?Assistance Recommended at Discharge Frequent or constant Supervision/Assistance  ?Patient can return home with the following A little help with walking and/or transfers;A little help with bathing/dressing/bathroom;Assist for transportation;Assistance with cooking/housework ?  ?Equipment Recommendations ? Rolling walker (2 wheels)  ?  ?Recommendations for Other Services   ? ? ?  ?Precautions / Restrictions Precautions ?Precautions: Knee;Fall ?Restrictions ?Weight Bearing Restrictions: No ?RLE Weight Bearing: Weight bearing as tolerated  ?  ? ?Mobility ? Bed Mobility ?Overal bed mobility: Needs Assistance ?Bed Mobility: Sit to Supine ?  ?  ?  ?Sit to supine: Min assist ?  ?General bed mobility comments: Increased time with cues for sequence and use of R LE to self assist ?  ? ?Transfers ?Overall transfer level: Needs assistance ?Equipment used: Rolling walker (2 wheels) ?Transfers: Sit to/from Stand ?Sit to Stand: Min assist ?  ?Step pivot transfers: Min assist ?  ?  ?  ?General transfer comment: cues for LE management and use of UEs to self assist ?  ? ?Ambulation/Gait ?Ambulation/Gait assistance: Min assist ?Gait Distance  (Feet): 40 Feet (and 15' back from bathroom) ?Assistive device: Rolling walker (2 wheels) ?Gait Pattern/deviations: Step-to pattern, Decreased step length - right, Decreased step length - left, Shuffle, Trunk flexed ?Gait velocity: decr ?  ?  ?General Gait Details: step by step sequence cues with pt tolerating limited wt on R LE ? ? ?Stairs ?  ?  ?  ?  ?  ? ? ?Wheelchair Mobility ?  ? ?Modified Rankin (Stroke Patients Only) ?  ? ? ?  ?Balance Overall balance assessment: Needs assistance ?Sitting-balance support: No upper extremity supported, Feet supported ?Sitting balance-Leahy Scale: Fair ?  ?  ?Standing balance support: Bilateral upper extremity supported ?Standing balance-Leahy Scale: Poor ?  ?  ?  ?  ?  ?  ?  ?  ?  ?  ?  ?  ?  ? ?  ?Cognition Arousal/Alertness: Awake/alert ?Behavior During Therapy: [facility] for tasks assessed/performed ?Overall Cognitive Status: Within Functional Limits for tasks assessed ?  ?  ?  ?  ?  ?  ?  ?  ?  ?  ?  ?  ?  ?  ?  ?  ?  ?  ?  ? ?  ?Exercises Total Joint Exercises ?Ankle Circles/Pumps: AROM, Both, Supine, 15 reps ?Quad Sets: AROM, Both, 5 reps, Supine ?Heel Slides: AAROM, Right, 10 reps, Supine ?Straight Leg Raises: AAROM, Right, 10 reps, Supine ?Goniometric ROM: AAROM at R knee -5 - 35 ? ?  ?General Comments   ?  ?  ? ?Pertinent Vitals/Pain Pain Assessment ?Pain Assessment: 0-10 ?Pain Score: 6  ?Pain Location: L knee ?Pain Descriptors / Indicators: Aching, Sore, Grimacing, Guarding ?Pain Intervention(s): Limited activity  within patient's tolerance, Monitored during session, Premedicated before session, Ice applied  ? ? ?Home Living   ?  ?  ?  ?  ?  ?  ?  ?  ?  ?   ?  ?Prior Function    ?  ?  ?   ? ?PT Goals (current goals can now be found in the care plan section) Acute Rehab PT Goals ?Patient Stated Goal: Be able to ball room dance in the future ?PT Goal Formulation: With patient ?Time For Goal Achievement: 01/28/22 ?Potential to Achieve Goals: Good ?Progress towards PT goals:  Progressing toward goals ? ?  ?Frequency ? ? ? 7X/week ? ? ? ?  ?PT Plan Current plan remains appropriate  ? ? ?Co-evaluation   ?  ?  ?  ?  ? ?  ?AM-PAC PT "6 Clicks" Mobility   ?Outcome Measure ? Help needed turning from your back to your side while in a flat bed without using bedrails?: A Lot ?Help needed moving from lying on your back to sitting on the side of a flat bed without using bedrails?: A Little ?Help needed moving to and from a bed to a chair (including a wheelchair)?: A Little ?Help needed standing up from a chair using your arms (e.g., wheelchair or bedside chair)?: A Little ?Help needed to walk in hospital room?: A Little ?Help needed climbing 3-5 steps with a railing? : A Lot ?6 Click Score: 16 ? ?  ?End of Session Equipment Utilized During Treatment: Gait belt;Right knee immobilizer ?Activity Tolerance: Patient tolerated treatment well;Patient limited by pain ?Patient left: in bed;with call bell/phone within reach;with chair alarm set;with family/visitor present ?Nurse Communication: Mobility status ?PT Visit Diagnosis: Difficulty in walking, not elsewhere classified (R26.2) ?  ? ? ?Time: 8916-9450 ?PT Time Calculation (min) (ACUTE ONLY): 38 min ? ?Charges:  $Gait Training: 8-22 mins ?$Therapeutic Exercise: 8-22 mins ?$Therapeutic Activity: 8-22 mins          ?          ? ?Debe Coder PT ?Acute Rehabilitation Services ?Pager [phone] ?Office [phone] ? ? ? ?Marlaya Turck ?01/23/2022, 1:14 PM ? ?

## 2022-01-23 NOTE — Progress Notes (Signed)
Subjective: ?2 Days Post-Op Procedure(s) (LRB): ?RIGHT TOTAL KNEE ARTHROPLASTY (Right) ?Patient reports pain as severe.  Being very anxious about her recovery.  She is complaining of suprapubic and urethral sharp burning pain. ? ?Objective: ?Vital signs in last 24 hours: ?Temp:  [97.5 ?F (36.4 ?C)-98.4 ?F (36.9 ?C)] 98.4 ?F (36.9 ?C) (05/14 0600) ?Pulse Rate:  [68-84] 84 (05/14 0600) ?Resp:  [18] 18 (05/14 0600) ?BP: (99-139)/(48-62) 139/62 (05/14 0600) ?SpO2:  [96 %-100 %] 100 % (05/14 0600) ? ?Intake/Output from previous day: ?05/13 0701 - 05/14 0700 ?In: 2397.7 [P.O.:2280; I.V.:117.7] ?Out: -  ?Intake/Output this shift: ?Total I/O ?In: 360 [P.O.:360] ?Out: -  ? ?Recent Labs  ?  01/22/22 ?0719  ?HGB 10.0*  ? ? ?Recent Labs  ?  01/22/22 ?0719  ?WBC 6.5  ?RBC 3.04*  ?HCT 29.8*  ?PLT 191  ? ? ?Recent Labs  ?  01/22/22 ?0719  ?NA 133*  ?K 3.5  ?CL 105  ?CO2 22  ?BUN 10  ?CREATININE 0.53  ?GLUCOSE 125*  ?CALCIUM 6.9*  ? ? ?No results for input(s): LABPT, INR in the last 72 hours. ? ?Sensation intact distally ?Intact pulses distally ?Dorsiflexion/Plantar flexion intact ?Incision: dressing C/D/I ?No cellulitis present ?Compartment soft ? ? ?Assessment/Plan: ?2 Days Post-Op Procedure(s) (LRB): ?RIGHT TOTAL KNEE ARTHROPLASTY (Right) ?Plan to continue to mobilize with physical therapy.  We will plan for a clean-catch urine culture given her suprapubic pain and burning.  We will hopefully continue to mobilize although given her current pain she may require an additional day in the hospital ? ? ? ?Vanetta Mulders ?01/23/2022, 8:25 AM ? ?

## 2022-01-23 NOTE — Progress Notes (Signed)
Physical Therapy Treatment ?Patient Details ?Name: Elaine Navarro ?MRN: [id number] ?DOB: [birth year] ?Today's Date: 01/23/2022 ? ? ?History of Present Illness Pt s/p R TKR and with hx of oseoporosis ? ?  ?PT Comments  ? ? Pt continues motivated and with noted improvement in balance, activity tolerance and R LE WB tolerance.  Pt hopeful for dc home tomorrow.   ?Recommendations for follow up therapy are one component of a multi-disciplinary discharge planning process, led by the attending physician.  Recommendations may be updated based on patient status, additional functional criteria and insurance authorization. ? ?Follow Up Recommendations ? Follow physician's recommendations for discharge plan and follow up therapies ?  ?  ?Assistance Recommended at Discharge Frequent or constant Supervision/Assistance  ?Patient can return home with the following A little help with walking and/or transfers;A little help with bathing/dressing/bathroom;Assist for transportation;Assistance with cooking/housework ?  ?Equipment Recommendations ? Rolling walker (2 wheels)  ?  ?Recommendations for Other Services   ? ? ?  ?Precautions / Restrictions Precautions ?Precautions: Knee;Fall ?Restrictions ?Weight Bearing Restrictions: No ?RLE Weight Bearing: Weight bearing as tolerated  ?  ? ?Mobility ? Bed Mobility ?Overal bed mobility: Needs Assistance ?Bed Mobility: Sit to Supine, Supine to Sit ?  ?  ?Supine to sit: Min assist ?Sit to supine: Min assist ?  ?General bed mobility comments: Increased time with cues for sequence and use of R LE to self assist ?  ? ?Transfers ?Overall transfer level: Needs assistance ?Equipment used: Rolling walker (2 wheels) ?Transfers: Sit to/from Stand ?Sit to Stand: Min guard ?  ?Step pivot transfers: Min assist ?  ?  ?  ?General transfer comment: cues for LE management and use of UEs to self assist ?  ? ?Ambulation/Gait ?Ambulation/Gait assistance: Min assist, Min guard ?Gait Distance (Feet): 80 Feet (with  one standing rest break) ?Assistive device: Rolling walker (2 wheels) ?Gait Pattern/deviations: Step-to pattern, Decreased step length - right, Decreased step length - left, Shuffle, Trunk flexed ?Gait velocity: decr ?  ?  ?General Gait Details: cues for sequence, posture, position from RW and to encourage increased WB on operative LE ? ? ?Stairs ?  ?  ?  ?  ?  ? ? ?Wheelchair Mobility ?  ? ?Modified Rankin (Stroke Patients Only) ?  ? ? ?  ?Balance Overall balance assessment: Needs assistance ?Sitting-balance support: No upper extremity supported, Feet supported ?Sitting balance-Leahy Scale: Good ?  ?  ?Standing balance support: Single extremity supported ?Standing balance-Leahy Scale: Poor ?  ?  ?  ?  ?  ?  ?  ?  ?  ?  ?  ?  ?  ? ?  ?Cognition Arousal/Alertness: Awake/alert ?Behavior During Therapy: [facility] for tasks assessed/performed ?Overall Cognitive Status: Within Functional Limits for tasks assessed ?  ?  ?  ?  ?  ?  ?  ?  ?  ?  ?  ?  ?  ?  ?  ?  ?  ?  ?  ? ?  ?Exercises Total Joint Exercises ?Ankle Circles/Pumps: AROM, Both, Supine, 15 reps ?Quad Sets: AROM, Both, 5 reps, Supine ?Heel Slides: AAROM, Right, 10 reps, Supine ?Straight Leg Raises: AAROM, Right, 10 reps, Supine ?Goniometric ROM: AAROM at R knee -5 - 35 ? ?  ?General Comments   ?  ?  ? ?Pertinent Vitals/Pain Pain Assessment ?Pain Assessment: 0-10 ?Pain Score: 5  ?Pain Location: L knee ?Pain Descriptors / Indicators: Aching, Sore, Grimacing, Guarding ?Pain Intervention(s): Limited activity within patient's tolerance, Monitored  during session, Premedicated before session, Ice applied  ? ? ?Home Living   ?  ?  ?  ?  ?  ?  ?  ?  ?  ?   ?  ?Prior Function    ?  ?  ?   ? ?PT Goals (current goals can now be found in the care plan section) Acute Rehab PT Goals ?Patient Stated Goal: Be able to ball room dance in the future ?PT Goal Formulation: With patient ?Time For Goal Achievement: 01/28/22 ?Potential to Achieve Goals: Good ?Progress towards PT goals:  Progressing toward goals ? ?  ?Frequency ? ? ? 7X/week ? ? ? ?  ?PT Plan Current plan remains appropriate  ? ? ?Co-evaluation   ?  ?  ?  ?  ? ?  ?AM-PAC PT "6 Clicks" Mobility   ?Outcome Measure ? Help needed turning from your back to your side while in a flat bed without using bedrails?: A Little ?Help needed moving from lying on your back to sitting on the side of a flat bed without using bedrails?: A Little ?Help needed moving to and from a bed to a chair (including a wheelchair)?: A Little ?Help needed standing up from a chair using your arms (e.g., wheelchair or bedside chair)?: A Little ?Help needed to walk in hospital room?: A Little ?Help needed climbing 3-5 steps with a railing? : A Little ?6 Click Score: 18 ? ?  ?End of Session Equipment Utilized During Treatment: Gait belt;Right knee immobilizer ?Activity Tolerance: Patient tolerated treatment well ?Patient left: in bed;with call bell/phone within reach;with chair alarm set;with family/visitor present ?Nurse Communication: Mobility status ?PT Visit Diagnosis: Difficulty in walking, not elsewhere classified (R26.2) ?  ? ? ?Time: 1884-1660 ?PT Time Calculation (min) (ACUTE ONLY): 35 min ? ?Charges:  $Gait Training: 23-37 mins ?$Therapeutic Exercise: 8-22 mins ?$Therapeutic Activity: 8-22 mins          ?          ? ?Debe Coder PT ?Acute Rehabilitation Services ?Pager [phone] ?Office [phone] ? ? ? ?Margarete Horace ?01/23/2022, 4:49 PM ? ?

## 2022-01-23 NOTE — Progress Notes (Signed)
Dr. Sammuel Hines called concerning pt urinary burning and irritation. Antibiotic ordered and will start tonight.  ?

## 2022-01-24 ENCOUNTER — Encounter (HOSPITAL_COMMUNITY): Payer: Self-pay | Admitting: Orthopaedic Surgery

## 2022-01-24 LAB — URINE CULTURE: Culture: NO GROWTH

## 2022-01-24 MED ORDER — SULFAMETHOXAZOLE-TRIMETHOPRIM 800-160 MG PO TABS: 1.0000 | ORAL_TABLET | Freq: Two times a day (BID) | ORAL | 0 refills | Status: DC

## 2022-01-24 MED ORDER — SODIUM CHLORIDE 0.9 % IV BOLUS
250.0000 mL | Freq: Once | INTRAVENOUS | Status: AC
  Administered 2022-01-24: 250 mL via INTRAVENOUS

## 2022-01-24 MED ORDER — SULFAMETHOXAZOLE-TRIMETHOPRIM 800-160 MG PO TABS
1.0000 | ORAL_TABLET | Freq: Two times a day (BID) | ORAL | 0 refills | Status: DC
Start: 2022-01-24 — End: 2022-05-19

## 2022-01-24 NOTE — Plan of Care (Signed)
  Problem: Education: Goal: Knowledge of the prescribed therapeutic regimen will improve Outcome: Progressing Goal: Individualized Educational Video(s) Outcome: Progressing   Problem: Activity: Goal: Ability to avoid complications of mobility impairment will improve Outcome: Progressing Goal: Range of joint motion will improve Outcome: Progressing   Problem: Pain Management: Goal: Pain level will decrease with appropriate interventions Outcome: Progressing   

## 2022-01-24 NOTE — Progress Notes (Signed)
?   01/24/22 0800  ?Assess: MEWS Score  ?Temp 98.4 ?F (36.9 ?C)  ?BP (!) 72/46  ?Pulse Rate 62  ?Resp 18  ?Level of Consciousness Alert  ?SpO2 98 %  ?O2 Device Room Air  ?Patient Activity (if Appropriate) In bed  ?Assess: MEWS Score  ?MEWS Temp 0  ?MEWS Systolic 2  ?MEWS Pulse 0  ?MEWS RR 0  ?MEWS LOC 0  ?MEWS Score 2  ?MEWS Score Color Yellow  ?Assess: if the MEWS score is Yellow or Red  ?Were vital signs taken at a resting state? No  ?Focused Assessment No change from prior assessment  ?Does the patient meet 2 or more of the SIRS criteria? No  ?MEWS guidelines implemented *See Row Information* Yes  ?Treat  ?Pain Scale 0-10  ?Pain Score 4  ?Pain Type Surgical pain  ?Pain Location Knee  ?Pain Orientation Right  ?Pain Descriptors / Indicators Aching  ?Pain Frequency Intermittent  ?Pain Onset On-going  ?Patients Stated Pain Goal 2  ?Pain Intervention(s) Cold applied;Repositioned  ?Escalate  ?MEWS: Escalate Yellow: discuss with charge nurse/RN and consider discussing with provider and RRT  ?Notify: Charge Nurse/RN  ?Name of Charge Nurse/RN Notified Evelena Peat RN  ?Date Charge Nurse/RN Notified 01/24/22  ?Time Charge Nurse/RN Notified 0900  ?Notify: Provider  ?Provider Name/Title Dr Ninfa Linden  ?Date Provider Notified 01/24/22  ?Time Provider Notified [phone]  ?Method of Notification Call  ?Notification Reason Other (Comment) ?(bp low)  ?Provider response See new orders  ?Date of Provider Response 01/24/22  ?Time of Provider Response [phone]  ?Notify: Rapid Response  ?Name of Rapid Response RN Notified n/a  ?Document  ?Patient Outcome Stabilized after interventions ?(bolus guven, BP rechecked)  ?Progress note created (see row info) Yes  ?Assess: SIRS CRITERIA  ?SIRS Temperature  0  ?SIRS Pulse 0  ?SIRS Respirations  0  ?SIRS WBC 0  ?SIRS Score Sum  0  ? ? ?Pt is alert and oriented, not in acute distress. MD notified, received and carried out new orders, vital signs rechecked. Pt monitored. ?

## 2022-01-24 NOTE — Progress Notes (Signed)
Physical Therapy Treatment ?Patient Details ?Name: Elaine Navarro ?MRN: [id number] ?DOB: 02/22/1946 ?Today's Date: 01/24/2022 ? ? ?History of Present Illness Pt s/p R TKR and with hx of oseoporosis ? ?  ?PT Comments  ? ? Pt progressing very well. Reviewed gait/transfer safety-including car transfers, and TKA HEP with pt and husband. BP has been soft this am however pt is asymptomatic during PT session. Ready to d/c from PT standpoint with husband assist as needed  ?  ?Recommendations for follow up therapy are one component of a multi-disciplinary discharge planning process, led by the attending physician.  Recommendations may be updated based on patient status, additional functional criteria and insurance authorization. ? ?Follow Up Recommendations ? Follow physician's recommendations for discharge plan and follow up therapies ?  ?  ?Assistance Recommended at Discharge Frequent or constant Supervision/Assistance  ?Patient can return home with the following A little help with walking and/or transfers;A little help with bathing/dressing/bathroom;Assist for transportation;Assistance with cooking/housework ?  ?Equipment Recommendations ? Rolling walker (2 wheels)  ?  ?Recommendations for Other Services   ? ? ?  ?Precautions / Restrictions Precautions ?Precautions: Knee;Fall ?Precaution Comments: KI not utilized today, improved quad activation, nearing IND SLR ?Required Braces or Orthoses: Knee Immobilizer - Right ?Restrictions ?Weight Bearing Restrictions: No ?RLE Weight Bearing: Weight bearing as tolerated  ?  ? ?Mobility ? Bed Mobility ?Overal bed mobility: Needs Assistance ?Bed Mobility: Sit to Supine, Supine to Sit ?  ?  ?Supine to sit: Min guard, Supervision ?Sit to supine: Supervision, Min guard ?  ?General bed mobility comments: Increased time with cues for sequence and use of gait belt to self assist ?  ? ?Transfers ?Overall transfer level: Needs assistance ?Equipment used: Rolling walker (2  wheels) ?Transfers: Sit to/from Stand ?Sit to Stand: Min guard, Supervision ?  ?  ?  ?  ?  ?General transfer comment: cues for LE management and correct hand placement ?  ? ?Ambulation/Gait ?Ambulation/Gait assistance: Min assist, Min guard ?  ?Assistive device: Rolling walker (2 wheels) ?Gait Pattern/deviations: Step-to pattern, Decreased step length - right, Decreased step length - left, Shuffle, Trunk flexed ?Gait velocity: decr ?  ?  ?General Gait Details: cues for sequence, posture, position from RW and to encourage increased WB on operative LE ? ? ?Stairs ?  ?  ?  ?  ?  ? ? ?Wheelchair Mobility ?  ? ?Modified Rankin (Stroke Patients Only) ?  ? ? ?  ?Balance Overall balance assessment: Needs assistance ?Sitting-balance support: No upper extremity supported, Feet supported ?Sitting balance-Leahy Scale: Good ?  ?  ?Standing balance support: Single extremity supported ?Standing balance-Leahy Scale: Poor ?  ?  ?  ?  ?  ?  ?  ?  ?  ?  ?  ?  ?  ? ?  ?Cognition Arousal/Alertness: Awake/alert ?Behavior During Therapy: [facility] for tasks assessed/performed ?Overall Cognitive Status: Within Functional Limits for tasks assessed ?  ?  ?  ?  ?  ?  ?  ?  ?  ?  ?  ?  ?  ?  ?  ?  ?  ?  ?  ? ?  ?Exercises Total Joint Exercises ?Ankle Circles/Pumps: AROM, Both, Supine, 15 reps ?Quad Sets: AROM, Both, 5 reps, Supine ?Heel Slides: AAROM, Right, 5 reps (instructed husband in assist) ?Straight Leg Raises: AAROM, AROM, Right, 5 reps ? ?  ?General Comments   ?  ?  ? ?Pertinent Vitals/Pain Pain Assessment ?Pain Assessment: 0-10 ?Pain Score: 5  ?  Pain Location: R knee ?Pain Descriptors / Indicators: Aching, Sore, Grimacing, Guarding ?Pain Intervention(s): Limited activity within patient's tolerance, Monitored during session, Premedicated before session, Repositioned, Ice applied  ? ? ?Home Living   ?  ?  ?  ?  ?  ?  ?  ?  ?  ?   ?  ?Prior Function    ?  ?  ?   ? ?PT Goals (current goals can now be found in the care plan section) Acute Rehab  PT Goals ?Patient Stated Goal: Be able to ball room dance in the future ?PT Goal Formulation: With patient ?Time For Goal Achievement: 01/28/22 ?Potential to Achieve Goals: Good ?Progress towards PT goals: Progressing toward goals ? ?  ?Frequency ? ? ? 7X/week ? ? ? ?  ?PT Plan Current plan remains appropriate  ? ? ?Co-evaluation   ?  ?  ?  ?  ? ?  ?AM-PAC PT "6 Clicks" Mobility   ?Outcome Measure ? Help needed turning from your back to your side while in a flat bed without using bedrails?: A Little ?Help needed moving from lying on your back to sitting on the side of a flat bed without using bedrails?: A Little ?Help needed moving to and from a bed to a chair (including a wheelchair)?: A Little ?Help needed standing up from a chair using your arms (e.g., wheelchair or bedside chair)?: A Little ?Help needed to walk in hospital room?: A Little ?Help needed climbing 3-5 steps with a railing? : A Little ?6 Click Score: 18 ? ?  ?End of Session Equipment Utilized During Treatment: Gait belt ?Activity Tolerance: Patient tolerated treatment well ?Patient left: with call bell/phone within reach;with family/visitor present;in bed ?Nurse Communication: Mobility status ?PT Visit Diagnosis: Difficulty in walking, not elsewhere classified (R26.2) ?  ? ? ?Time: 2542-7062 ?PT Time Calculation (min) (ACUTE ONLY): 25 min ? ?Charges:  $Gait Training: 8-22 mins ?$Therapeutic Exercise: 8-22 mins          ?          ? Baxter Flattery, PT ? ?Acute Rehab Dept [facility]) [phone] ?Pager [phone] ? ?01/24/2022 ? ? ? ?Elaine Navarro ?01/24/2022, 12:00 PM ? ?

## 2022-01-24 NOTE — Progress Notes (Signed)
Patient ID: Elaine Navarro, female   DOB: December 02, 1945, 76 y.o.   MRN: [id number] ?No acute changes overnight.  Vitals stable and right knee stable.  Working slowly with PT and mobility.  Plan to discharge to home later today. ?

## 2022-01-24 NOTE — Discharge Summary (Signed)
Patient ID: ?Elaine Navarro ?MRN: [id number] ?DOB/AGE: [birth year] 77 y.o. ? ?Admit date: 01/21/2022 ?Discharge date: 01/24/2022 ? ?Admission Diagnoses:  ?Principal Problem: ?  Arthritis of right knee ?Active Problems: ?  Status post right knee replacement ? ? ?Discharge Diagnoses:  ?Same ? ?Past Medical History:  ?Diagnosis Date  ? Aneurysm of renal artery (HCC)   ? followed at [facility], stable with CT/angiogram 10/2021  ? Fracture of right wrist 2011  ? Fell in bathroom.  ? Hypertension   ? Left thyroid nodule 2003  ? S/P biopsy - benign  ? Lymphocytic colitis 03/2013  ? Migraines   ? Osteoporosis   ? PONV (postoperative nausea and vomiting)   ? Sciatica   ? Visceral injury   ? detached due to recent fall  ? ? ?Surgeries: Procedure(s): ?RIGHT TOTAL KNEE ARTHROPLASTY on 01/21/2022 ?  ?Consultants:  ? ?Discharged Condition: Improved ? ?Hospital Course: Elaine Navarro is an 76 y.o. female who was admitted 01/21/2022 for operative treatment ofArthritis of right knee. Patient has severe unremitting pain that affects sleep, daily activities, and work/hobbies. After pre-op clearance the patient was taken to the operating room on 01/21/2022 and underwent  Procedure(s): ?RIGHT TOTAL KNEE ARTHROPLASTY.   ? ?Patient was given perioperative antibiotics:  ?Anti-infectives (From admission, onward)  ? ? Start     Dose/Rate Route Frequency Ordered Stop  ? 01/24/22 0000  sulfamethoxazole-trimethoprim (BACTRIM DS) 800-160 MG tablet  Status:  Discontinued       ? 1 tablet Oral Every 12 hours 01/24/22 0736 01/24/22   ? 01/24/22 0000  sulfamethoxazole-trimethoprim (BACTRIM DS) 800-160 MG tablet       ? 1 tablet Oral Every 12 hours 01/24/22 1033    ? 01/23/22 2200  sulfamethoxazole-trimethoprim (BACTRIM DS) 800-160 MG per tablet 1 tablet       ? 1 tablet Oral Every 12 hours 01/23/22 1928    ? 01/21/22 0615  ceFAZolin (ANCEF) IVPB 2g/100 mL premix       ? 2 g ?200 mL/hr over 30 Minutes Intravenous On call to O.R. 01/21/22 0609 01/21/22  0830  ? ?  ?  ? ?Patient was given sequential compression devices, early ambulation, and chemoprophylaxis to prevent DVT. ? ?Patient benefited maximally from hospital stay and there were no complications.   ? ?Recent vital signs: Patient Vitals for the past 24 hrs: ? BP Temp Temp src Pulse Resp SpO2  ?01/24/22 1047 (!) 108/51 (!) 97.5 ?F (36.4 ?C) Axillary 73 20 100 %  ?01/24/22 1000 (!) 120/43 -- -- 68 -- --  ?01/24/22 0800 (!) 72/46 98.4 ?F (36.9 ?C) Oral 62 18 98 %  ?01/24/22 0618 (!) 97/44 98.3 ?F (36.8 ?C) Oral 73 17 98 %  ?01/24/22 0024 -- 98.7 ?F (37.1 ?C) Oral -- -- --  ?01/23/22 2324 -- 99 ?F (37.2 ?C) Oral -- -- --  ?01/23/22 2146 (!) 137/59 98.8 ?F (37.1 ?C) Oral 98 17 100 %  ?  ? ?Recent laboratory studies:  ?Recent Labs  ?  01/22/22 ?0719  ?WBC 6.5  ?HGB 10.0*  ?HCT 29.8*  ?PLT 191  ?NA 133*  ?K 3.5  ?CL 105  ?CO2 22  ?BUN 10  ?CREATININE 0.53  ?GLUCOSE 125*  ?CALCIUM 6.9*  ? ? ? ?Discharge Medications:   ?Allergies as of 01/24/2022   ? ?   Reactions  ? Caine-1 [lidocaine]   ? Allergic to Septocaine Articaine HCL 4% w/epi  ? Indomethacin Swelling  ? Articaine-epinephrine Other (See Comments)  ?  Hydrocodone Nausea Only  ? Hydrocodone-acetaminophen Nausea Only  ? Latex   ? Possible allergy.  ? Lamisil [terbinafine] Rash  ? ?  ? ?  ?Medication List  ?  ? ?STOP taking these medications   ? ?Nucynta 50 MG tablet ?Generic drug: tapentadol ?  ? ?  ? ?TAKE these medications   ? ?amLODipine 5 MG tablet ?Commonly known as: NORVASC ?Take 5 mg by mouth daily. ?  ?aspirin 81 MG chewable tablet ?Chew 1 tablet (81 mg total) by mouth 2 (two) times daily. ?  ?budesonide 3 MG 24 hr capsule ?Commonly known as: ENTOCORT EC ?Take 3 mg by mouth daily. ?  ?buPROPion 150 MG 24 hr tablet ?Commonly known as: WELLBUTRIN XL ?Take 1 tablet (150 mg total) by mouth daily. ?  ?calcium carbonate 600 MG Tabs tablet ?Commonly known as: OS-CAL ?Take 600 mg by mouth daily with breakfast. ?  ?cholecalciferol 25 MCG (1000 UNIT)  tablet ?Commonly known as: VITAMIN D3 ?Take 1,000 Units by mouth daily. ?  ?diclofenac Sodium 1 % Gel ?Commonly known as: VOLTAREN ?Apply 1 application. topically daily as needed (pain). ?  ?diphenhydrAMINE 25 mg capsule ?Commonly known as: BENADRYL ?Take 25 mg by mouth daily as needed for allergies. ?  ?finasteride 5 MG tablet ?Commonly known as: PROSCAR ?Take 5 mg by mouth daily. ?  ?fluticasone 50 MCG/ACT nasal spray ?Commonly known as: FLONASE ?Place 1 spray into both nostrils daily as needed for allergies. ?  ?losartan 100 MG tablet ?Commonly known as: COZAAR ?Take 100 mg by mouth daily. ?  ?magnesium oxide 400 MG tablet ?Commonly known as: MAG-OX ?Take 400 mg by mouth daily. ?  ?melatonin 5 MG Tabs ?Take 5 mg by mouth at bedtime as needed (sleep). ?  ?multivitamin tablet ?Take 1 tablet by mouth daily. ?  ?NAC 600 MG Caps ?Generic drug: Acetylcysteine ?Take 600 mg by mouth daily. ?  ?Omega-3 1000 MG Caps ?Take 1,000 mg by mouth daily. ?  ?omeprazole 20 MG capsule ?Commonly known as: PRILOSEC ?Take 20 mg by mouth 2 (two) times daily before a meal. ?  ?ondansetron 4 MG tablet ?Commonly known as: ZOFRAN ?Take 1 tablet (4 mg total) by mouth every 8 (eight) hours as needed for nausea or vomiting. ?  ?oxyCODONE 5 MG immediate release tablet ?Commonly known as: Roxicodone ?Take 1 tablet (5 mg total) by mouth every 4 (four) hours as needed for severe pain. ?What changed:  ?how much to take ?when to take this ?  ?sulfamethoxazole-trimethoprim 800-160 MG tablet ?Commonly known as: BACTRIM DS ?Take 1 tablet by mouth every 12 (twelve) hours. ?  ?SUMAtriptan 50 MG tablet ?Commonly known as: IMITREX ?Take 50 mg by mouth every 2 (two) hours as needed for migraine. ?  ?thiamine 100 MG tablet ?Commonly known as: Vitamin B-1 ?Take 100 mg by mouth daily. ?  ?tiZANidine 4 MG tablet ?Commonly known as: ZANAFLEX ?Take 1 tablet (4 mg total) by mouth every 6 (six) hours as needed for muscle spasms. ?  ?vitamin B-12 1000 MCG  tablet ?Commonly known as: CYANOCOBALAMIN ?Take 1,000 mcg by mouth daily. ?  ?VITAMIN C PO ?Take 500 mg by mouth daily. ?  ? ?  ? ?  ?  ? ? ?  ?Durable Medical Equipment  ?(From admission, onward)  ?  ? ? ?  ? ?  Start     Ordered  ? 01/21/22 1137  DME 3 n 1  Once       ? 01/21/22 1136  ?  01/21/22 1137  DME Walker rolling  Once       ?Question Answer Comment  ?Walker: With 5 Inch Wheels   ?Patient needs a walker to treat with the following condition Status post total right knee replacement   ?  ? 01/21/22 1136  ? ?  ?  ? ?  ? ? ?Diagnostic Studies: DG Knee Right Port ? ?Result Date: 01/21/2022 ?CLINICAL DATA:  Status post right knee arthroplasty. EXAM: PORTABLE RIGHT KNEE - 1-2 VIEW COMPARISON:  01/07/2022. FINDINGS: Postoperative changes from right total knee arthroplasty. Gas is identified within the soft tissues around the distal femur and proximal tibia as well as within the joint space. Hardware components are in anatomic alignment. IMPRESSION: Status post right total knee arthroplasty. Electronically Signed   By: Kerby Moors M.D.   On: 01/21/2022 10:51   ? ?Disposition: Discharge disposition: 01-Home or Self Care ? ? ? ? ? ? ? ? ? Follow-up Information   ? ? Mcarthur Rossetti, MD. Go on 02/03/2022.   ?Specialty: Orthopedic Surgery ?Why: at 1:45 pm for your first in office appointment with Dr. Ninfa Linden ?Contact information: ?[address] ?[place] [address] ?[phone] ? ? ?  ?  ? ?  ?  ? ?  ? ? ? ?Signed: ?Mcarthur Rossetti ?01/24/2022, 1:52 PM ? ? ? ?

## 2022-01-24 NOTE — Progress Notes (Signed)
Discharge instructions reviewed with patient and husband, all questions answered. No specific concerns voiced. Medicated prior to discharge. ?

## 2022-01-25 ENCOUNTER — Telehealth: Payer: Self-pay | Admitting: *Deleted

## 2022-01-25 DIAGNOSIS — Z87891 Personal history of nicotine dependence: Secondary | ICD-10-CM | POA: Diagnosis not present

## 2022-01-25 DIAGNOSIS — K52832 Lymphocytic colitis: Secondary | ICD-10-CM | POA: Diagnosis not present

## 2022-01-25 DIAGNOSIS — F32A Depression, unspecified: Secondary | ICD-10-CM | POA: Diagnosis not present

## 2022-01-25 DIAGNOSIS — E041 Nontoxic single thyroid nodule: Secondary | ICD-10-CM | POA: Diagnosis not present

## 2022-01-25 DIAGNOSIS — E559 Vitamin D deficiency, unspecified: Secondary | ICD-10-CM | POA: Diagnosis not present

## 2022-01-25 DIAGNOSIS — Z90710 Acquired absence of both cervix and uterus: Secondary | ICD-10-CM | POA: Diagnosis not present

## 2022-01-25 DIAGNOSIS — Z7952 Long term (current) use of systemic steroids: Secondary | ICD-10-CM | POA: Diagnosis not present

## 2022-01-25 DIAGNOSIS — Z792 Long term (current) use of antibiotics: Secondary | ICD-10-CM | POA: Diagnosis not present

## 2022-01-25 DIAGNOSIS — I722 Aneurysm of renal artery: Secondary | ICD-10-CM | POA: Diagnosis not present

## 2022-01-25 DIAGNOSIS — I10 Essential (primary) hypertension: Secondary | ICD-10-CM | POA: Diagnosis not present

## 2022-01-25 DIAGNOSIS — M810 Age-related osteoporosis without current pathological fracture: Secondary | ICD-10-CM | POA: Diagnosis not present

## 2022-01-25 DIAGNOSIS — Z96651 Presence of right artificial knee joint: Secondary | ICD-10-CM | POA: Diagnosis not present

## 2022-01-25 DIAGNOSIS — Z471 Aftercare following joint replacement surgery: Secondary | ICD-10-CM | POA: Diagnosis not present

## 2022-01-25 DIAGNOSIS — G43719 Chronic migraine without aura, intractable, without status migrainosus: Secondary | ICD-10-CM | POA: Diagnosis not present

## 2022-01-25 DIAGNOSIS — G8929 Other chronic pain: Secondary | ICD-10-CM | POA: Diagnosis not present

## 2022-01-25 DIAGNOSIS — Z7982 Long term (current) use of aspirin: Secondary | ICD-10-CM | POA: Diagnosis not present

## 2022-01-25 NOTE — Telephone Encounter (Signed)
Ortho bundle D/C call completed. 

## 2022-01-27 ENCOUNTER — Other Ambulatory Visit: Payer: Self-pay | Admitting: Orthopaedic Surgery

## 2022-01-27 ENCOUNTER — Telehealth: Payer: Self-pay | Admitting: Orthopaedic Surgery

## 2022-01-27 DIAGNOSIS — I10 Essential (primary) hypertension: Secondary | ICD-10-CM | POA: Diagnosis not present

## 2022-01-27 DIAGNOSIS — Z471 Aftercare following joint replacement surgery: Secondary | ICD-10-CM | POA: Diagnosis not present

## 2022-01-27 DIAGNOSIS — F32A Depression, unspecified: Secondary | ICD-10-CM | POA: Diagnosis not present

## 2022-01-27 DIAGNOSIS — G8929 Other chronic pain: Secondary | ICD-10-CM | POA: Diagnosis not present

## 2022-01-27 DIAGNOSIS — M810 Age-related osteoporosis without current pathological fracture: Secondary | ICD-10-CM | POA: Diagnosis not present

## 2022-01-27 DIAGNOSIS — Z96651 Presence of right artificial knee joint: Secondary | ICD-10-CM | POA: Diagnosis not present

## 2022-01-27 NOTE — Telephone Encounter (Signed)
Pt called requesting a call back. Pt states she had surgery last week and started physical therapy and her pain level is worse. Pt is asking for a call back for medical advice. She is unsure of what to do. Please call pt at [phone].

## 2022-01-28 ENCOUNTER — Other Ambulatory Visit: Payer: Self-pay | Admitting: Orthopaedic Surgery

## 2022-01-28 ENCOUNTER — Telehealth: Payer: Self-pay | Admitting: Orthopaedic Surgery

## 2022-01-28 DIAGNOSIS — Z96651 Presence of right artificial knee joint: Secondary | ICD-10-CM | POA: Diagnosis not present

## 2022-01-28 DIAGNOSIS — I10 Essential (primary) hypertension: Secondary | ICD-10-CM | POA: Diagnosis not present

## 2022-01-28 DIAGNOSIS — M810 Age-related osteoporosis without current pathological fracture: Secondary | ICD-10-CM | POA: Diagnosis not present

## 2022-01-28 DIAGNOSIS — F32A Depression, unspecified: Secondary | ICD-10-CM | POA: Diagnosis not present

## 2022-01-28 DIAGNOSIS — G8929 Other chronic pain: Secondary | ICD-10-CM | POA: Diagnosis not present

## 2022-01-28 DIAGNOSIS — Z471 Aftercare following joint replacement surgery: Secondary | ICD-10-CM | POA: Diagnosis not present

## 2022-01-28 MED ORDER — OXYCODONE HCL 5 MG PO TABS: 5.0000 mg | ORAL_TABLET | ORAL | 0 refills | Status: DC | PRN

## 2022-01-28 NOTE — Telephone Encounter (Signed)
Pt's husband Trilby Drummer called requesting a refill of oxycodone. Please send to pharmacy on file and call Trilby Drummer when script has been sent in. Pt phone number is [phone]

## 2022-01-30 ENCOUNTER — Telehealth: Payer: Self-pay | Admitting: Orthopaedic Surgery

## 2022-01-31 ENCOUNTER — Other Ambulatory Visit: Payer: Self-pay | Admitting: *Deleted

## 2022-01-31 DIAGNOSIS — Z471 Aftercare following joint replacement surgery: Secondary | ICD-10-CM | POA: Diagnosis not present

## 2022-01-31 DIAGNOSIS — M810 Age-related osteoporosis without current pathological fracture: Secondary | ICD-10-CM | POA: Diagnosis not present

## 2022-01-31 DIAGNOSIS — F32A Depression, unspecified: Secondary | ICD-10-CM | POA: Diagnosis not present

## 2022-01-31 DIAGNOSIS — Z96651 Presence of right artificial knee joint: Secondary | ICD-10-CM

## 2022-01-31 DIAGNOSIS — I10 Essential (primary) hypertension: Secondary | ICD-10-CM | POA: Diagnosis not present

## 2022-01-31 DIAGNOSIS — G8929 Other chronic pain: Secondary | ICD-10-CM | POA: Diagnosis not present

## 2022-01-31 NOTE — Telephone Encounter (Signed)
Spoke with patient and she said the pharmacy called her and they do have the zanaflex ready

## 2022-01-31 NOTE — Telephone Encounter (Signed)
[name] received message from pharmacy stating the prescribed medication was not authorized. She called to check on status & I informed her that the drug store had sent a message yesterday & we are waiting for Dr. Ninfa Linden to see the message.

## 2022-02-02 DIAGNOSIS — F32A Depression, unspecified: Secondary | ICD-10-CM | POA: Diagnosis not present

## 2022-02-02 DIAGNOSIS — I10 Essential (primary) hypertension: Secondary | ICD-10-CM | POA: Diagnosis not present

## 2022-02-02 DIAGNOSIS — M810 Age-related osteoporosis without current pathological fracture: Secondary | ICD-10-CM | POA: Diagnosis not present

## 2022-02-02 DIAGNOSIS — Z96651 Presence of right artificial knee joint: Secondary | ICD-10-CM | POA: Diagnosis not present

## 2022-02-02 DIAGNOSIS — Z471 Aftercare following joint replacement surgery: Secondary | ICD-10-CM | POA: Diagnosis not present

## 2022-02-02 DIAGNOSIS — G8929 Other chronic pain: Secondary | ICD-10-CM | POA: Diagnosis not present

## 2022-02-03 ENCOUNTER — Telehealth: Payer: Self-pay | Admitting: *Deleted

## 2022-02-03 ENCOUNTER — Encounter: Payer: Self-pay | Admitting: Orthopaedic Surgery

## 2022-02-03 ENCOUNTER — Telehealth: Payer: Self-pay | Admitting: Orthopaedic Surgery

## 2022-02-03 ENCOUNTER — Other Ambulatory Visit: Payer: Self-pay | Admitting: Orthopaedic Surgery

## 2022-02-03 ENCOUNTER — Ambulatory Visit (INDEPENDENT_AMBULATORY_CARE_PROVIDER_SITE_OTHER): Payer: Medicare Other | Admitting: Orthopaedic Surgery

## 2022-02-03 DIAGNOSIS — Z96651 Presence of right artificial knee joint: Secondary | ICD-10-CM

## 2022-02-03 MED ORDER — GABAPENTIN 100 MG PO CAPS: 100.0000 mg | ORAL_CAPSULE | Freq: Three times a day (TID) | ORAL | 1 refills | Status: DC | PRN

## 2022-02-03 MED ORDER — OXYCODONE HCL 5 MG PO TABS: 5.0000 mg | ORAL_TABLET | ORAL | 0 refills | Status: DC | PRN

## 2022-02-03 MED ORDER — TIZANIDINE HCL 4 MG PO TABS: ORAL_TABLET | ORAL | 0 refills | Status: DC

## 2022-02-03 NOTE — Telephone Encounter (Signed)
Pt called requesting a refill of oxycodone. Please send to pharmacy on file. Pt phone number is [phone]

## 2022-02-03 NOTE — Telephone Encounter (Signed)
14 day in office Ortho bundle meeting completed. No new CM needs at this time.

## 2022-02-03 NOTE — Progress Notes (Signed)
The patient will be 2 weeks tomorrow status post a right total knee arthroplasty.  She has a significant issue with postoperative pain management in terms of getting her pain under control.  On exam today she does have almost full extension but I can only flex her to about 75 degrees.  She is 76 years old.  I stressed the importance of pushing herself through therapy at home on a daily basis and we need to get her set up for outpatient therapy.  The staples have been removed and Steri-Strips applied.  She can stop her aspirin twice daily.  We did refill her oxycodone earlier and will refill her Zanaflex.  I will try Neurontin 100 mg up to 3 times a day.  She will continue over-the-counter oral anti-inflammatories.  I like to see her back in 4 weeks to see how she is coming along from a mobility and range of motion standpoint.  No x-rays are needed.

## 2022-02-03 NOTE — Telephone Encounter (Signed)
Please advise 

## 2022-02-04 DIAGNOSIS — Z96651 Presence of right artificial knee joint: Secondary | ICD-10-CM | POA: Diagnosis not present

## 2022-02-04 DIAGNOSIS — G8929 Other chronic pain: Secondary | ICD-10-CM | POA: Diagnosis not present

## 2022-02-04 DIAGNOSIS — I10 Essential (primary) hypertension: Secondary | ICD-10-CM | POA: Diagnosis not present

## 2022-02-04 DIAGNOSIS — Z471 Aftercare following joint replacement surgery: Secondary | ICD-10-CM | POA: Diagnosis not present

## 2022-02-04 DIAGNOSIS — M810 Age-related osteoporosis without current pathological fracture: Secondary | ICD-10-CM | POA: Diagnosis not present

## 2022-02-04 DIAGNOSIS — F32A Depression, unspecified: Secondary | ICD-10-CM | POA: Diagnosis not present

## 2022-02-08 DIAGNOSIS — Z96651 Presence of right artificial knee joint: Secondary | ICD-10-CM | POA: Diagnosis not present

## 2022-02-08 DIAGNOSIS — F32A Depression, unspecified: Secondary | ICD-10-CM | POA: Diagnosis not present

## 2022-02-08 DIAGNOSIS — M461 Sacroiliitis, not elsewhere classified: Secondary | ICD-10-CM | POA: Diagnosis not present

## 2022-02-08 DIAGNOSIS — M25561 Pain in right knee: Secondary | ICD-10-CM | POA: Diagnosis not present

## 2022-02-08 DIAGNOSIS — G894 Chronic pain syndrome: Secondary | ICD-10-CM | POA: Diagnosis not present

## 2022-02-08 DIAGNOSIS — M810 Age-related osteoporosis without current pathological fracture: Secondary | ICD-10-CM | POA: Diagnosis not present

## 2022-02-08 DIAGNOSIS — I10 Essential (primary) hypertension: Secondary | ICD-10-CM | POA: Diagnosis not present

## 2022-02-08 DIAGNOSIS — M71552 Other bursitis, not elsewhere classified, left hip: Secondary | ICD-10-CM | POA: Diagnosis not present

## 2022-02-08 DIAGNOSIS — G8929 Other chronic pain: Secondary | ICD-10-CM | POA: Diagnosis not present

## 2022-02-08 DIAGNOSIS — Z471 Aftercare following joint replacement surgery: Secondary | ICD-10-CM | POA: Diagnosis not present

## 2022-02-08 NOTE — Therapy (Signed)
OUTPATIENT PHYSICAL THERAPY LOWER EXTREMITY EVALUATION   Patient Name: Elaine Navarro MRN: [id number] DOB:06-Jul-1946, 76 y.o., female Today's Date: 02/08/2022    Past Medical History:  Diagnosis Date   Aneurysm of renal artery ([place])    followed at [facility], stable with CT/angiogram 10/2021   Fracture of right wrist 2011   Fell in bathroom.   Hypertension    Left thyroid nodule 2003   S/P biopsy - benign   Lymphocytic colitis 03/2013   Migraines    Osteoporosis    PONV (postoperative nausea and vomiting)    Sciatica    Visceral injury    detached due to recent fall   Past Surgical History:  Procedure Laterality Date   ABDOMINOPLASTY  2010   [place] EXCISIONAL BIOPSY Bilateral 2001   x 3   CATARACT EXTRACTION, BILATERAL Bilateral 01/2020   Dental implants  1993   HEMORRHOID SURGERY  2000   KNEE ARTHROSCOPY W/ DEBRIDEMENT Right 07/2020   Dr. Berenice Primas   TOTAL KNEE ARTHROPLASTY Right 01/21/2022   Procedure: RIGHT TOTAL KNEE ARTHROPLASTY;  Surgeon: Mcarthur Rossetti, MD;  Location: [facility];  Service: Orthopedics;  Laterality: Right;  RFNA   VAGINAL HYSTERECTOMY  1974   Dysfunctional uterine bleeding.   Patient Active Problem List   Diagnosis Date Noted   Status post right knee replacement 01/21/2022   Arthritis of right knee 01/20/2022   Lung nodules 12/28/2021   Primary hypertension 05/13/2021   Thyroid nodule 05/13/2021   History of depression 05/13/2021   H/O: hysterectomy 05/13/2021   Postmenopausal 05/13/2021   Piriformis syndrome of left side 04/07/2021   Chronic pain of right knee 03/04/2021   H/O sciatica 11/04/2020   Age-related osteoporosis without current pathological fracture 11/04/2020   Vitamin D insufficiency 12/20/2019   Actinic keratosis 03/21/2017   Focal lymphocytic colitis 05/29/2013   Aneurysm artery, renal ([place]) 09/28/2012   Headache, chronic migraine without aura, intractable 03/02/2012    PCP: ***  REFERRING  PROVIDER: Mcarthur Rossetti, MD  REFERRING DIAG: [id number] (ICD-10-CM) - Status post right knee replacement  THERAPY DIAG:  No diagnosis found.  Rationale for Evaluation and Treatment Rehabilitation  ONSET DATE: Rt TKA 01/21/2022  SUBJECTIVE:   SUBJECTIVE STATEMENT: ***  PERTINENT HISTORY: Osteoporosis, HTN  PAIN:  Yes: NPRS scale: ***/10 Pain location: *** Pain description: *** Aggravating factors: *** Relieving factors: ***  PRECAUTIONS: None  WEIGHT BEARING RESTRICTIONS No  FALLS:  Has patient fallen in last 6 months? No  LIVING ENVIRONMENT: Lives with: {[facility] lives with:25569::"lives with their family"} Lives in: {Lives in:25570} Stairs: {opstairs:27293} Has following equipment at home: {Assistive devices:23999}  OCCUPATION: ***  PLOF: Independent  PATIENT GOALS Reduce pain   OBJECTIVE:   PATIENT SURVEYS:  FOTO intake      predicted  COGNITION:  Overall cognitive status: Within functional limits for tasks assessed     SENSATION: {sensation:27233}  EDEMA:  Localized edema noted Rt knee/leg  MUSCLE LENGTH: Hamstrings: Right *** deg; Left *** deg Marcello Moores test: Right *** deg; Left *** deg  POSTURE: {posture:25561}  PALPATION: ***  LOWER EXTREMITY ROM:  Active ROM Right eval Left eval  Hip flexion    Hip extension    Hip abduction    Hip adduction    Hip internal rotation    Hip external rotation    Knee flexion    Knee extension    Ankle dorsiflexion    Ankle plantarflexion    Ankle inversion  Ankle eversion     (Blank rows = not tested)  LOWER EXTREMITY MMT:  MMT Right eval Left eval  Hip flexion    Hip extension    Hip abduction    Hip adduction    Hip internal rotation    Hip external rotation    Knee flexion    Knee extension    Ankle dorsiflexion    Ankle plantarflexion    Ankle inversion    Ankle eversion     (Blank rows = not tested)  FUNCTIONAL TESTS:  18 inch chair transfer: Lt SLS:  Rt  SLS:  GAIT: Distance walked: *** Assistive device utilized: {Assistive devices:23999} Level of assistance: {Levels of assistance:24026} Comments: ***    TODAY'S TREATMENT: Therex:  HEP instruction/performance c cues for techniques, handout provided.  Trial set performed of each for comprehension and symptom assessment.  See below for exercise list.   PATIENT EDUCATION:  Education details: *** Person educated: {Person educated:25204} Education method: {Education Method:25205} Education comprehension: {Education Comprehension:25206}   HOME EXERCISE PROGRAM: ***  ASSESSMENT:  CLINICAL IMPRESSION: Patient is a 76 y.o. who comes to clinic with complaints of Rt knee pain s/p Rt TKA 01/21/2022 with mobility, strength and movement coordination deficits that impair their ability to perform usual daily and recreational functional activities without increase difficulty/symptoms at this time.  Patient to benefit from skilled PT services to address impairments and limitations to improve to previous level of function without restriction secondary to condition.    OBJECTIVE IMPAIRMENTS {opptimpairments:25111}.   ACTIVITY LIMITATIONS {activitylimitations:27494}  PARTICIPATION LIMITATIONS: {participationrestrictions:25113}  PERSONAL FACTORS {Personal factors:25162} are also affecting patient's functional outcome.   REHAB POTENTIAL: {rehabpotential:25112}  CLINICAL DECISION MAKING: Stable/uncomplicated  EVALUATION COMPLEXITY: Low   GOALS: Goals reviewed with patient? Yes  Short term PT Goals (target date for Short term goals are 3 weeks ***) Patient will demonstrate independent use of home exercise program to maintain progress from in clinic treatments. Goal status: New   Long term PT goals (target dates for all long term goals are 10 weeks  *** )  1. Patient will demonstrate/report pain at worst less than or equal to 2/10 to facilitate minimal limitation in daily activity  secondary to pain symptoms. Goal status: New  2. Patient will demonstrate independent use of home exercise program to facilitate ability to maintain/progress functional gains from skilled physical therapy services. Goal status: New  3. Patient will demonstrate FOTO outcome > or = *** % to indicate reduced disability due to condition. Goal status: New  4.  Patient will demonstrate Rt  knee AROM 0-110 degrees to facilitate ability to perform transfers, sitting, ambulation, stair navigation s restriction due to mobility. Goal status: New  5.  Patient will demonstrate Rt LE MMT 5/5 throughout to facilitate ability to perform usual standing, walking, stairs at PLOF s limitation due to symptoms.   Goal status: New  6.  Patient will demonstrate independent ambulation community distances > 300 ft to facilitate community integration at [facility].   Goal status: New  7.  *** a.  Goal Status: New   PLAN: PT FREQUENCY: {rehab frequency:25116}  PT DURATION: {rehab duration:25117}  PLANNED INTERVENTIONS: Therapeutic exercises, Therapeutic activity, Neuro Muscular re-education, Balance training, Gait training, Patient/Family education, Joint mobilization, Stair training, DME instructions, Dry Needling, Electrical stimulation, Cryotherapy, Moist heat, Taping, Ultrasound, Ionotophoresis '4mg'$ /ml Dexamethasone, and Manual therapy.  All included unless contraindicated  PLAN FOR NEXT SESSION: ***   Girtha Rm, PT 02/08/2022, 9:25 AM

## 2022-02-09 ENCOUNTER — Ambulatory Visit (INDEPENDENT_AMBULATORY_CARE_PROVIDER_SITE_OTHER): Payer: Medicare Other | Admitting: Rehabilitative and Restorative Service Providers"

## 2022-02-09 ENCOUNTER — Encounter: Payer: Self-pay | Admitting: Rehabilitative and Restorative Service Providers"

## 2022-02-09 ENCOUNTER — Other Ambulatory Visit: Payer: Self-pay

## 2022-02-09 DIAGNOSIS — R600 Localized edema: Secondary | ICD-10-CM | POA: Diagnosis not present

## 2022-02-09 DIAGNOSIS — M25561 Pain in right knee: Secondary | ICD-10-CM

## 2022-02-09 DIAGNOSIS — M25661 Stiffness of right knee, not elsewhere classified: Secondary | ICD-10-CM

## 2022-02-09 DIAGNOSIS — G8929 Other chronic pain: Secondary | ICD-10-CM

## 2022-02-09 DIAGNOSIS — M6281 Muscle weakness (generalized): Secondary | ICD-10-CM | POA: Diagnosis not present

## 2022-02-09 DIAGNOSIS — R262 Difficulty in walking, not elsewhere classified: Secondary | ICD-10-CM

## 2022-02-10 ENCOUNTER — Encounter: Payer: Self-pay | Admitting: Rehabilitative and Restorative Service Providers"

## 2022-02-10 ENCOUNTER — Ambulatory Visit (INDEPENDENT_AMBULATORY_CARE_PROVIDER_SITE_OTHER): Payer: Medicare Other | Admitting: Rehabilitative and Restorative Service Providers"

## 2022-02-10 DIAGNOSIS — R600 Localized edema: Secondary | ICD-10-CM | POA: Diagnosis not present

## 2022-02-10 DIAGNOSIS — M6281 Muscle weakness (generalized): Secondary | ICD-10-CM

## 2022-02-10 DIAGNOSIS — M25661 Stiffness of right knee, not elsewhere classified: Secondary | ICD-10-CM | POA: Diagnosis not present

## 2022-02-10 DIAGNOSIS — G8929 Other chronic pain: Secondary | ICD-10-CM | POA: Diagnosis not present

## 2022-02-10 DIAGNOSIS — R262 Difficulty in walking, not elsewhere classified: Secondary | ICD-10-CM | POA: Diagnosis not present

## 2022-02-10 DIAGNOSIS — M25561 Pain in right knee: Secondary | ICD-10-CM

## 2022-02-10 NOTE — Therapy (Signed)
OUTPATIENT PHYSICAL THERAPY TREATMENT NOTE   Patient Name: Elaine Navarro MRN: [id number] DOB:Feb 14, 1946, 76 y.o., female Today's Date: 02/10/2022  PCP: Vernie Shanks, MD REFERRING PROVIDER: Mcarthur Rossetti, MD  END OF SESSION:   PT End of Session - 02/10/22 0931     Visit Number 2    Number of Visits 20    Date for PT Re-Evaluation 04/20/22    Authorization Type Medicare, KX at 15    Progress Note Due on Visit 10    PT Start Time 0845    PT Stop Time 0937    PT Time Calculation (min) 52 min    Activity Tolerance Patient tolerated treatment well    Behavior During Therapy [facility] for tasks assessed/performed             Past Medical History:  Diagnosis Date   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, stable with CT/angiogram 10/2021   Fracture of right wrist 2011   Fell in bathroom.   Hypertension    Left thyroid nodule 2003   S/P biopsy - benign   Lymphocytic colitis 03/2013   Migraines    Osteoporosis    PONV (postoperative nausea and vomiting)    Sciatica    Visceral injury    detached due to recent fall   Past Surgical History:  Procedure Laterality Date   ABDOMINOPLASTY  2010   [place] EXCISIONAL BIOPSY Bilateral 2001   x 3   CATARACT EXTRACTION, BILATERAL Bilateral 01/2020   Dental implants  1993   HEMORRHOID SURGERY  2000   KNEE ARTHROSCOPY W/ DEBRIDEMENT Right 07/2020   Dr. Berenice Primas   TOTAL KNEE ARTHROPLASTY Right 01/21/2022   Procedure: RIGHT TOTAL KNEE ARTHROPLASTY;  Surgeon: Mcarthur Rossetti, MD;  Location: [facility];  Service: Orthopedics;  Laterality: Right;  RFNA   VAGINAL HYSTERECTOMY  1974   Dysfunctional uterine bleeding.   Patient Active Problem List   Diagnosis Date Noted   Status post right knee replacement 01/21/2022   Arthritis of right knee 01/20/2022   Lung nodules 12/28/2021   Primary hypertension 05/13/2021   Thyroid nodule 05/13/2021   History of depression 05/13/2021   H/O:  hysterectomy 05/13/2021   Postmenopausal 05/13/2021   Piriformis syndrome of left side 04/07/2021   Chronic pain of right knee 03/04/2021   H/O sciatica 11/04/2020   Age-related osteoporosis without current pathological fracture 11/04/2020   Vitamin D insufficiency 12/20/2019   Actinic keratosis 03/21/2017   Focal lymphocytic colitis 05/29/2013   Aneurysm artery, renal ([place]) 09/28/2012   Headache, chronic migraine without aura, intractable 03/02/2012    REFERRING DIAG:  A35.573 (ICD-10-CM) - Status post right knee replacement  THERAPY DIAG:  Difficulty in walking, not elsewhere classified  Muscle weakness (generalized)  Localized edema  Stiffness of right knee, not elsewhere classified  Chronic pain of right knee  Rationale for Evaluation and Treatment Rehabilitation  PERTINENT HISTORY: Osteoporosis, HTN  PRECAUTIONS: None  SUBJECTIVE: [name] reports early HEP compliance.  She is getting 2-3 hours of uninterrupted sleep before needing additional pain medication.  PAIN:  Are you having pain? Yes: NPRS scale: 2-8/10 this week on a scale of 0-10/10 Pain location: R knee Pain description: Ache, sore Aggravating factors: Prolonged postures  Relieving factors: Ice, exercises, pain meds   OBJECTIVE: (objective measures completed at initial evaluation unless otherwise dated) OBJECTIVE:    PATIENT SURVEYS:  02/09/2022 FOTO intake  21    predicted 43  COGNITION: 02/09/2022  Overall cognitive status: Within functional limits for tasks assessed                              EDEMA:  02/09/2022  Localized edema noted Rt knee/leg   PALPATION: 02/09/2022 Tenderness noted anterior knee joint bilateral, incision region, posterior knee/distal thigh Rt   LOWER EXTREMITY ROM:   Active ROM Right eval Left eval Right 02/10/2022  Hip flexion       Hip extension       Hip abduction       Hip adduction       Hip internal rotation       Hip external rotation       Knee flexion  AROM in supine heel slide  67    AROM supine 71  Knee extension -10 in seated LAQ AROM   -6 in supine heel prop PROM   AROM supine -5  Ankle dorsiflexion       Ankle plantarflexion       Ankle inversion       Ankle eversion        (Blank rows = not tested)   LOWER EXTREMITY MMT:   MMT Right eval Left eval  Hip flexion 4/5 5/5  Hip extension      Hip abduction      Hip adduction      Hip internal rotation      Hip external rotation      Knee flexion 4/5 5/5  Knee extension 3+/5 5/5  Ankle dorsiflexion 5/5 5/5  Ankle plantarflexion      Ankle inversion      Ankle eversion       (Blank rows = not tested)   FUNCTIONAL TESTS:  02/09/2022  18 inch chair transfer: able to perform s UE assist but most WB on Lt leg Lt SLS:             15 seconds     Rt SLS: 3 seconds   GAIT: 02/09/2022  Distance walked: Household distances Assistive device utilized:  [employer] Comments: maintained mid range knee flexion throughout, reduced stance on Rt c limited hip extension/terminal stance phase.       TODAY'S TREATMENT: 02/10/2022 Tailgate knee flexion 1 minute  Seated knee flexion AAROM (L pushes R into flexion) 10X 10 seconds  Supine knee flexion AAROM with belt 10X 10 seconds  Quadriceps sets with heel prop 2 sets of 10 for 5 seconds  Seated knee extension stretch (foot in chair) 3 minutes  Recumbent bike Seat 4 for 8 minutes AAROM     Vasopneumatic Medium Pressure R knee 10 minutes 34*      02/09/2022  Therex:            HEP instruction/performance c cues for techniques, handout provided.  Trial set performed of each for comprehension and symptom assessment.  See below for exercise list.  Spend additional time for education on frequency and consistency of intervention to ensure proper gains.      PATIENT EDUCATION:  02/10/2022   02/09/2022  Education details: HEP, POC Person educated: Patient Education method: Consulting civil engineer, Demonstration, Verbal cues, and Handouts Education  comprehension: verbalized understanding, returned demonstration, and verbal cues required     HOME EXERCISE PROGRAM: Access Code: [email or web address] URL: [email or web address] Date: 02/10/2022 Prepared by: Vista Mink  Exercises - Supine Heel Slide with Strap (Mirrored)  - 2-3 x daily - 7 x weekly -  1 sets - 10 reps - 5 hold - Supine Knee Extension Mobilization with Weight  - 5 x daily - 7 x weekly - 1 sets - 5 reps - to tolerance up to 15 mins hold - Seated Long Arc Quad  - 3-5 x daily - 7 x weekly - 1-2 sets - 10 reps - 2 hold - Seated Quad Set  - 3-5 x daily - 7 x weekly - 1 sets - 10 reps - 5 hold - Seated Passive Knee Extension with Weight  - 5 x daily - 7 x weekly - 1 sets - 1 reps - to tolerance hold - Seated Knee Flexion AAROM  - 3-5 x daily - 7 x weekly - 1 sets - 1 reps - 3 minutes hold   ASSESSMENT:   CLINICAL IMPRESSION: [name] reports good early HEP compliance.  Knee extension AROM is improved vs day 1 as is flexion.  Edema control, knee AROM and quadriceps strength remain the early emphasis with Tylea's rehabilitation post-TKA.     OBJECTIVE IMPAIRMENTS Abnormal gait, decreased activity tolerance, decreased balance, decreased coordination, decreased endurance, decreased mobility, difficulty walking, decreased ROM, decreased strength, hypomobility, increased edema, impaired perceived functional ability, impaired flexibility, improper body mechanics, and pain.    ACTIVITY LIMITATIONS carrying, lifting, bending, sitting, standing, squatting, sleeping, stairs, transfers, bed mobility, and locomotion level   PARTICIPATION LIMITATIONS: cleaning, laundry, driving, shopping, community activity, and yard work   PERSONAL FACTORS  Osteoporosis, HTN  are also affecting patient's functional outcome.    REHAB POTENTIAL: Good   CLINICAL DECISION MAKING: Stable/uncomplicated   EVALUATION COMPLEXITY: Low     GOALS: Goals reviewed with patient? Yes   Short term PT  Goals (target date for Short term goals are 3 weeks 03/02/2022) Patient will demonstrate independent use of home exercise program to maintain progress from in clinic treatments. Goal status: New   Long term PT goals (target dates for all long term goals are 10 weeks  04/20/2022 )   1. Patient will demonstrate/report pain at worst less than or equal to 2/10 to facilitate minimal limitation in daily activity secondary to pain symptoms. Goal status: New   2. Patient will demonstrate independent use of home exercise program to facilitate ability to maintain/progress functional gains from skilled physical therapy services. Goal status: New   3. Patient will demonstrate FOTO outcome > or = 43 % to indicate reduced disability due to condition. Goal status: New   4.  Patient will demonstrate Rt  knee AROM 0-110 degrees to facilitate ability to perform transfers, sitting, ambulation, stair navigation s restriction due to mobility. Goal status: New   5.  Patient will demonstrate Rt LE MMT 5/5 throughout to facilitate ability to perform usual standing, walking, stairs at PLOF s limitation due to symptoms.   Goal status: New   6.  Patient will demonstrate independent ambulation community distances > 300 ft to facilitate community integration at [facility].   Goal status: New   7.  Patient will demonstrate ascending/descending stairs c reciprocal gait pattern without handrail assist for house entry.  a.  Goal Status: New     PLAN: PT FREQUENCY: 2x-3x /week (3x week for first 1-2 weeks)   PT DURATION: 12 weeks   PLANNED INTERVENTIONS: Therapeutic exercises, Therapeutic activity, Neuro Muscular re-education, Balance training, Gait training, Patient/Family education, Joint mobilization, Stair training, DME instructions, Dry Needling, Electrical stimulation, Cryotherapy, Moist heat, Taping, Ultrasound, Ionotophoresis '4mg'$ /ml Dexamethasone, and Manual therapy.  All included unless contraindicated  PLAN FOR  NEXT SESSION: Review HEP.  Progress quadriceps strength, range of motion.  Early static balance and vaso.      Farley Ly, PT, MPT 02/10/2022, 9:45 AM

## 2022-02-15 ENCOUNTER — Encounter: Payer: Self-pay | Admitting: Rehabilitative and Restorative Service Providers"

## 2022-02-15 ENCOUNTER — Ambulatory Visit (INDEPENDENT_AMBULATORY_CARE_PROVIDER_SITE_OTHER): Payer: Medicare Other | Admitting: Rehabilitative and Restorative Service Providers"

## 2022-02-15 DIAGNOSIS — R262 Difficulty in walking, not elsewhere classified: Secondary | ICD-10-CM | POA: Diagnosis not present

## 2022-02-15 DIAGNOSIS — M6281 Muscle weakness (generalized): Secondary | ICD-10-CM | POA: Diagnosis not present

## 2022-02-15 DIAGNOSIS — G8929 Other chronic pain: Secondary | ICD-10-CM

## 2022-02-15 DIAGNOSIS — M25661 Stiffness of right knee, not elsewhere classified: Secondary | ICD-10-CM

## 2022-02-15 DIAGNOSIS — M25561 Pain in right knee: Secondary | ICD-10-CM | POA: Diagnosis not present

## 2022-02-15 DIAGNOSIS — R600 Localized edema: Secondary | ICD-10-CM | POA: Diagnosis not present

## 2022-02-15 NOTE — Therapy (Addendum)
OUTPATIENT PHYSICAL THERAPY TREATMENT NOTE   Patient Name: Elaine Navarro MRN: [id number] DOB:02-13-1946, 76 y.o., female Today's Date: 02/15/2022  PCP: Elaine Shanks, MD REFERRING PROVIDER: Mcarthur Rossetti, MD  END OF SESSION:   PT End of Session - 02/15/22 1402     Visit Number 3    Number of Visits 20    Date for PT Re-Evaluation 04/20/22    Authorization Type Medicare, KX at 15    Progress Note Due on Visit 10    PT Start Time 1343    PT Stop Time 1433    PT Time Calculation (min) 50 min    Activity Tolerance Patient tolerated treatment well    Behavior During Therapy [facility] for tasks assessed/performed              Past Medical History:  Diagnosis Date   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   Hypertension    Left thyroid nodule 2003   S/P biopsy - benign   Lymphocytic colitis 03/2013   Migraines    Osteoporosis    PONV (postoperative nausea and vomiting)    Sciatica    Visceral injury    detached due to recent fall   Past Surgical History:  Procedure Laterality Date   ABDOMINOPLASTY  2010   [place] EXCISIONAL BIOPSY Bilateral 2001   x 3   CATARACT EXTRACTION, BILATERAL Bilateral 01/2020   Dental implants  1993   HEMORRHOID SURGERY  2000   KNEE ARTHROSCOPY W/ DEBRIDEMENT Right 07/2020   Dr. Berenice Primas   TOTAL KNEE ARTHROPLASTY Right 01/21/2022   Procedure: RIGHT TOTAL KNEE ARTHROPLASTY;  Surgeon: Elaine Rossetti, MD;  Location: [facility];  Service: Orthopedics;  Laterality: Right;  RFNA   VAGINAL HYSTERECTOMY  1974   Dysfunctional uterine bleeding.   Patient Active Problem List   Diagnosis Date Noted   Status post right knee replacement 01/21/2022   Arthritis of right knee 01/20/2022   Lung nodules 12/28/2021   Primary hypertension 05/13/2021   Thyroid nodule 05/13/2021   History of depression 05/13/2021   H/O:  hysterectomy 05/13/2021   Postmenopausal 05/13/2021   Piriformis syndrome of left side 04/07/2021   Chronic pain of right knee 03/04/2021   H/O sciatica 11/04/2020   Age-related osteoporosis without current pathological fracture 11/04/2020   Vitamin D insufficiency 12/20/2019   Actinic keratosis 03/21/2017   Focal lymphocytic colitis 05/29/2013   Aneurysm artery, renal ([place]) 09/28/2012   Headache, chronic migraine without aura, intractable 03/02/2012    REFERRING DIAG:  T46.568 (ICD-10-CM) - Status post right knee replacement  THERAPY DIAG:  Chronic pain of right knee  Stiffness of right knee, not elsewhere classified  Muscle weakness (generalized)  Difficulty in walking, not elsewhere classified  Localized edema  Rationale for Evaluation and Treatment Rehabilitation  PERTINENT HISTORY: Osteoporosis, HTN  PRECAUTIONS: None  SUBJECTIVE: Elaine Navarro reports early HEP compliance.  She is getting 2-3 hours of uninterrupted sleep before needing additional pain medication.  PAIN:  NPRS scale:7/10 in Rt knee, Rt posterior/lateral leg 8-9/10 Pain location: Rt knee, Rt leg Pain description: Ache, sore Aggravating factors: Prolonged postures,consistent through day Relieving factors: Ice, exercises, pain meds   OBJECTIVE: (objective measures completed at initial evaluation unless otherwise dated) OBJECTIVE:    PATIENT SURVEYS:  02/09/2022 FOTO intake  21    predicted 43  COGNITION: 02/09/2022  Overall cognitive status: Within functional limits for tasks assessed                              EDEMA:  02/09/2022  Localized edema noted Rt knee/leg   PALPATION: 02/09/2022 Tenderness noted anterior knee joint bilateral, incision region, posterior knee/distal thigh Rt   LOWER EXTREMITY ROM:   Active ROM Right eval Left eval Right 02/10/2022 Right 02/15/2022  Hip flexion        Hip extension        Hip abduction        Hip adduction        Hip internal rotation        Hip  external rotation        Knee flexion AROM in supine heel slide  67    AROM supine 71 AROM supine 84  Knee extension -10 in seated LAQ AROM   -6 in supine heel prop PROM   AROM supine -5   Ankle dorsiflexion        Ankle plantarflexion        Ankle inversion        Ankle eversion         (Blank rows = not tested)   LOWER EXTREMITY MMT:   MMT Right eval Left eval  Hip flexion 4/5 5/5  Hip extension      Hip abduction      Hip adduction      Hip internal rotation      Hip external rotation      Knee flexion 4/5 5/5  Knee extension 3+/5 5/5  Ankle dorsiflexion 5/5 5/5  Ankle plantarflexion      Ankle inversion      Ankle eversion       (Blank rows = not tested)   FUNCTIONAL TESTS:  02/09/2022  18 inch chair transfer: able to perform s UE assist but most WB on Lt leg Lt SLS:             15 seconds     Rt SLS: 3 seconds   GAIT: 02/09/2022  Distance walked: Household distances Assistive device utilized:  [employer] Comments: maintained mid range knee flexion throughout, reduced stance on Rt c limited hip extension/terminal stance phase.       TODAY'S TREATMENT: 02/15/2022:  Therex:   Seated alternating isometric flexion/extension holds Rt knee in mid range 5 sec each direction x 12 each   Seated Rt knee flexion c Lt leg overpressure 15 sec x 3   Supine AROM heel slide c quad set combo 5 sec hold each x 10   Supine single knee to opposite shoulder 30 sec x 2 (instruction for home)   Supine bridge x 5 - 5 sec hold Seated gastroc stretch with strap Rt leg 30 sec x1   Neuro re-ed   Church pew anterior/posterior ankle strategy 2 mins   Retro step x20 bilateral   Gait   SPC Lt UE house hold distances < 150 ft within clinic throughout visit c sequencing and cues for placement, SBA.   Manual:   Seated Rt knee mobilization c movement for Rt knee flexion c contralateral leg movement opposite  Vasopneumatic Medium Pressure Rt knee 10 minutes 34*  02/10/2022 Tailgate knee flexion 1  minute  Seated knee flexion AAROM (L pushes R into flexion) 10X 10 seconds  Supine knee flexion AAROM with belt 10X 10 seconds  Quadriceps  sets with heel prop 2 sets of 10 for 5 seconds  Seated knee extension stretch (foot in chair) 3 minutes  Recumbent bike Seat 4 for 8 minutes AAROM           02/09/2022  Therex:            HEP instruction/performance c cues for techniques, handout provided.  Trial set performed of each for comprehension and symptom assessment.  See below for exercise list.  Spend additional time for education on frequency and consistency of intervention to ensure proper gains.      PATIENT EDUCATION:  02/15/2022 Education details: HEP, POC Person educated: Patient Education method: Consulting civil engineer, Demonstration, Verbal cues, and Handouts Education comprehension: verbalized understanding, returned demonstration, and verbal cues required     HOME EXERCISE PROGRAM: Access Code: [email or web address] URL: [email or web address] Date: 02/15/2022 Prepared by: Scot Jun  Exercises - Supine Heel Slide with Strap (Mirrored)  - 2-3 x daily - 7 x weekly - 1 sets - 10 reps - 5 hold - Seated Long Arc Quad  - 3-5 x daily - 7 x weekly - 1-2 sets - 10 reps - 2 hold - Seated Quad Set  - 3-5 x daily - 7 x weekly - 1 sets - 10 reps - 5 hold - Seated Passive Knee Extension with Weight  - 5 x daily - 7 x weekly - 1 sets - 1 reps - to tolerance hold - Seated Knee Flexion AAROM  - 3-5 x daily - 7 x weekly - 1 sets - 1 reps - 3 minutes hold - [employer]  - 3-5 x daily - 7 x weekly - 1 sets - 1-2 mins hold   ASSESSMENT:   CLINICAL IMPRESSION: Noted gain in Rt knee mobility for flexion as compared to previous.  Transitioning in clinic to [facility] use c good overall control after review today.  Pt to benefit from continued skilled PT services at this time to continue progression.      OBJECTIVE IMPAIRMENTS Abnormal gait, decreased activity tolerance, decreased balance, decreased  coordination, decreased endurance, decreased mobility, difficulty walking, decreased ROM, decreased strength, hypomobility, increased edema, impaired perceived functional ability, impaired flexibility, improper body mechanics, and pain.    ACTIVITY LIMITATIONS carrying, lifting, bending, sitting, standing, squatting, sleeping, stairs, transfers, bed mobility, and locomotion level   PARTICIPATION LIMITATIONS: cleaning, laundry, driving, shopping, community activity, and yard work   PERSONAL FACTORS  Osteoporosis, HTN  are also affecting patient's functional outcome.    REHAB POTENTIAL: Good   CLINICAL DECISION MAKING: Stable/uncomplicated   EVALUATION COMPLEXITY: Low     GOALS: Goals reviewed with patient? Yes   Short term PT Goals (target date for Short term goals are 3 weeks 03/02/2022) Patient will demonstrate independent use of home exercise program to maintain progress from in clinic treatments. Goal status: on going  - assessed 02/15/2022   Long term PT goals (target dates for all long term goals are 10 weeks  04/20/2022 )   1. Patient will demonstrate/report pain at worst less than or equal to 2/10 to facilitate minimal limitation in daily activity secondary to pain symptoms. Goal status: New   2. Patient will demonstrate independent use of home exercise program to facilitate ability to maintain/progress functional gains from skilled physical therapy services. Goal status: New   3. Patient will demonstrate FOTO outcome > or = 43 % to indicate reduced disability due to condition. Goal status: New   4.  Patient will demonstrate Rt  knee AROM 0-110  degrees to facilitate ability to perform transfers, sitting, ambulation, stair navigation s restriction due to mobility. Goal status: New   5.  Patient will demonstrate Rt LE MMT 5/5 throughout to facilitate ability to perform usual standing, walking, stairs at PLOF s limitation due to symptoms.   Goal status: New   6.  Patient will  demonstrate independent ambulation community distances > 300 ft to facilitate community integration at [facility].   Goal status: New   7.  Patient will demonstrate ascending/descending stairs c reciprocal gait pattern without handrail assist for house entry.  a.  Goal Status: New     PLAN: PT FREQUENCY: 2x-3x /week (3x week for first 1-2 weeks)   PT DURATION: 12 weeks   PLANNED INTERVENTIONS: Therapeutic exercises, Therapeutic activity, Neuro Muscular re-education, Balance training, Gait training, Patient/Family education, Joint mobilization, Stair training, DME instructions, Dry Needling, Electrical stimulation, Cryotherapy, Moist heat, Taping, Ultrasound, Ionotophoresis '4mg'$ /ml Dexamethasone, and Manual therapy.  All included unless contraindicated   PLAN FOR NEXT SESSION: Static balance, leg press, continued ROM gains.      Scot Jun, PT, DPT, OCS, ATC 02/15/22  2:25 PM

## 2022-02-16 ENCOUNTER — Ambulatory Visit (INDEPENDENT_AMBULATORY_CARE_PROVIDER_SITE_OTHER): Payer: Medicare Other | Admitting: Physical Therapy

## 2022-02-16 ENCOUNTER — Encounter: Payer: Self-pay | Admitting: Physical Therapy

## 2022-02-16 DIAGNOSIS — R600 Localized edema: Secondary | ICD-10-CM | POA: Diagnosis not present

## 2022-02-16 DIAGNOSIS — R262 Difficulty in walking, not elsewhere classified: Secondary | ICD-10-CM

## 2022-02-16 DIAGNOSIS — R152 Fecal urgency: Secondary | ICD-10-CM | POA: Diagnosis not present

## 2022-02-16 DIAGNOSIS — M25661 Stiffness of right knee, not elsewhere classified: Secondary | ICD-10-CM | POA: Diagnosis not present

## 2022-02-16 DIAGNOSIS — R151 Fecal smearing: Secondary | ICD-10-CM | POA: Diagnosis not present

## 2022-02-16 DIAGNOSIS — M6281 Muscle weakness (generalized): Secondary | ICD-10-CM

## 2022-02-16 DIAGNOSIS — K52832 Lymphocytic colitis: Secondary | ICD-10-CM | POA: Diagnosis not present

## 2022-02-16 DIAGNOSIS — Z8711 Personal history of peptic ulcer disease: Secondary | ICD-10-CM | POA: Diagnosis not present

## 2022-02-16 NOTE — Therapy (Signed)
OUTPATIENT PHYSICAL THERAPY TREATMENT NOTE   Patient Name: Elaine Navarro MRN: [id number] DOB:Jan 22, 1946, 76 y.o., female Today's Date: 02/16/2022  PCP: Vernie Shanks, MD REFERRING PROVIDER: Mcarthur Rossetti, MD  END OF SESSION:   PT End of Session - 02/16/22 1349     Visit Number 4    Number of Visits 20    Date for PT Re-Evaluation 04/20/22    Authorization Type Medicare, KX at 15    Progress Note Due on Visit 10    PT Start Time 4650    PT Stop Time 1440    PT Time Calculation (min) 55 min    Activity Tolerance Patient tolerated treatment well    Behavior During Therapy [facility] for tasks assessed/performed               Past Medical History:  Diagnosis Date   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   Hypertension    Left thyroid nodule 2003   S/P biopsy - benign   Lymphocytic colitis 03/2013   Migraines    Osteoporosis    PONV (postoperative nausea and vomiting)    Sciatica    Visceral injury    detached due to recent fall   Past Surgical History:  Procedure Laterality Date   ABDOMINOPLASTY  2010   [place] EXCISIONAL BIOPSY Bilateral 2001   x 3   CATARACT EXTRACTION, BILATERAL Bilateral 01/2020   Dental implants  1993   HEMORRHOID SURGERY  2000   KNEE ARTHROSCOPY W/ DEBRIDEMENT Right 07/2020   Dr. Berenice Primas   TOTAL KNEE ARTHROPLASTY Right 01/21/2022   Procedure: RIGHT TOTAL KNEE ARTHROPLASTY;  Surgeon: Mcarthur Rossetti, MD;  Location: [facility];  Service: Orthopedics;  Laterality: Right;  RFNA   VAGINAL HYSTERECTOMY  1974   Dysfunctional uterine bleeding.   Patient Active Problem List   Diagnosis Date Noted   Status post right knee replacement 01/21/2022   Arthritis of right knee 01/20/2022   Lung nodules 12/28/2021   Primary hypertension 05/13/2021   Thyroid nodule 05/13/2021   History of depression 05/13/2021   H/O:  hysterectomy 05/13/2021   Postmenopausal 05/13/2021   Piriformis syndrome of left side 04/07/2021   Chronic pain of right knee 03/04/2021   H/O sciatica 11/04/2020   Age-related osteoporosis without current pathological fracture 11/04/2020   Vitamin D insufficiency 12/20/2019   Actinic keratosis 03/21/2017   Focal lymphocytic colitis 05/29/2013   Aneurysm artery, renal ([place]) 09/28/2012   Headache, chronic migraine without aura, intractable 03/02/2012    REFERRING DIAG:  P54.656 (ICD-10-CM) - Status post right knee replacement  THERAPY DIAG:  Stiffness of right knee, not elsewhere classified  Muscle weakness (generalized)  Difficulty in walking, not elsewhere classified  Localized edema  Rationale for Evaluation and Treatment Rehabilitation  PERTINENT HISTORY: Osteoporosis, HTN  PRECAUTIONS: None  SUBJECTIVE: sitting in car for ~40 min to MD appt caused hamstring & right glute pain.    PAIN:  NPRS scale:  3-4/10 in Rt knee, Rt posterior/lateral leg 7-8/10 Pain location: Rt knee, Rt leg Pain description: Ache, sore Aggravating factors: Prolonged postures,consistent through day Relieving factors: Ice, exercises, pain meds   OBJECTIVE: (objective measures completed at initial evaluation unless otherwise dated) OBJECTIVE:    PATIENT SURVEYS:  02/09/2022 FOTO intake  21    predicted 43    COGNITION: 02/09/2022  Overall cognitive  status: Within functional limits for tasks assessed                              EDEMA:  02/09/2022  Localized edema noted Rt knee/leg   PALPATION: 02/09/2022 Tenderness noted anterior knee joint bilateral, incision region, posterior knee/distal thigh Rt   LOWER EXTREMITY ROM:   Active ROM Right Eval 02/09/22 Left Eval 02/09/22 Right 02/10/22 Right 02/15/22  Hip flexion        Hip extension        Hip abduction        Hip adduction        Hip internal rotation        Hip external rotation        Knee flexion AROM in supine heel slide  67     AROM supine 71 AROM supine 84  Knee extension -10 in seated LAQ AROM   -6 in supine heel prop PROM   AROM supine -5   Ankle dorsiflexion        Ankle plantarflexion        Ankle inversion        Ankle eversion         (Blank rows = not tested)   LOWER EXTREMITY MMT:   MMT Right Eval 02/09/22 Left Eval 02/09/22  Hip flexion 4/5 5/5  Hip extension      Hip abduction      Hip adduction      Hip internal rotation      Hip external rotation      Knee flexion 4/5 5/5  Knee extension 3+/5 5/5  Ankle dorsiflexion 5/5 5/5  Ankle plantarflexion      Ankle inversion      Ankle eversion       (Blank rows = not tested)   FUNCTIONAL TESTS:  02/09/2022  18 inch chair transfer: able to perform s UE assist but most WB on Lt leg Lt SLS:             15 seconds     Rt SLS: 3 seconds   GAIT: 02/09/2022  Distance walked: Household distances Assistive device utilized:  [employer] Comments: maintained mid range knee flexion throughout, reduced stance on Rt c limited hip extension/terminal stance phase.       TODAY'S TREATMENT: 02/16/2022 Therex:  Nustep seat 4 with BLEs & BUEs level 5 with focus on right knee flexion for 8 mn.    Seated alternating isometric flexion/extension holds Rt knee in mid range 5 sec each direction x 12 each  Seated Heel slides with Rt foot on pillow case ext quad set 5 sec & flex with end range LLE assist 5 sec for 15 reps.    Supine AAROM foot on 55cm ball with strap assist heel slide c quad set combo 5 sec hold each x 10  Supine single knee to opposite shoulder with 55cm ball & strap 30 sec x 2  Supine bridge x 10 reps - 5 sec hold Supine SLR with quad set 10 reps.   Neuro re-ed  [facility] pew anterior/posterior ankle strategy 2 mins  Retro step x 20 bilateral  Gait  Pre-gait weight shift with LLE in initial contact for weight acceptance & terminal stance for knee flexion. Pt performed 15 reps ea with cane & mat table support with tactile cues.   SPC with tactile  cues for house hold distances < 150 ft within clinic throughout  visit c sequencing and cues for placement, SBA.    Manual:   Seated Rt knee mobilization c movement for Rt knee flexion c contralateral leg movement opposite  Modalities:  Vaso left knee medium compression 34* 10 min with elevation  02/15/2022:  Therex:   Seated alternating isometric flexion/extension holds Rt knee in mid range 5 sec each direction x 12 each   Seated Rt knee flexion c Lt leg overpressure 15 sec x 3   Supine AROM heel slide c quad set combo 5 sec hold each x 10   Supine single knee to opposite shoulder 30 sec x 2 (instruction for home)   Supine bridge x 5 - 5 sec hold Seated gastroc stretch with strap Rt leg 30 sec x1   Neuro re-ed   [employer] pew anterior/posterior ankle strategy 2 mins   Retro step x20 bilateral   Gait   SPC Lt UE house hold distances < 150 ft within clinic throughout visit c sequencing and cues for placement, SBA.   Manual:   Seated Rt knee mobilization c movement for Rt knee flexion c contralateral leg movement opposite  02/10/2022 [place] knee flexion 1 minute  Seated knee flexion AAROM (L pushes R into flexion) 10X 10 seconds  Supine knee flexion AAROM with belt 10X 10 seconds  Quadriceps sets with heel prop 2 sets of 10 for 5 seconds  Seated knee extension stretch (foot in chair) 3 minutes  Recumbent bike Seat 4 for 8 minutes AAROM     Vasopneumatic Medium Pressure R knee 10 minutes 34*        HOME EXERCISE PROGRAM: Access Code: [email or web address] URL: [email or web address] Date: 02/15/2022 Prepared by: Scot Jun  Exercises - Supine Heel Slide with Strap (Mirrored)  - 2-3 x daily - 7 x weekly - 1 sets - 10 reps - 5 hold - Seated Long Arc Quad  - 3-5 x daily - 7 x weekly - 1-2 sets - 10 reps - 2 hold - Seated Quad Set  - 3-5 x daily - 7 x weekly - 1 sets - 10 reps - 5 hold - Seated Passive Knee Extension with Weight  - 5 x daily - 7 x weekly - 1 sets - 1 reps  - to tolerance hold - Seated Knee Flexion AAROM  - 3-5 x daily - 7 x weekly - 1 sets - 1 reps - 3 minutes hold - [employer] P[employer]  - 3-5 x daily - 7 x weekly - 1 sets - 1-2 mins hold   ASSESSMENT:   CLINICAL IMPRESSION: Patient tolerated slight progression in exercises & therapeutic activities with repetition & slow cautious movements.  She improved gait with pre-gait activities. She continues to benefit from skilled PT to recover s/p TKR.    OBJECTIVE IMPAIRMENTS Abnormal gait, decreased activity tolerance, decreased balance, decreased coordination, decreased endurance, decreased mobility, difficulty walking, decreased ROM, decreased strength, hypomobility, increased edema, impaired perceived functional ability, impaired flexibility, improper body mechanics, and pain.    ACTIVITY LIMITATIONS carrying, lifting, bending, sitting, standing, squatting, sleeping, stairs, transfers, bed mobility, and locomotion level   PARTICIPATION LIMITATIONS: cleaning, laundry, driving, shopping, community activity, and yard work   PERSONAL FACTORS  Osteoporosis, HTN  are also affecting patient's functional outcome.    REHAB POTENTIAL: Good   CLINICAL DECISION MAKING: Stable/uncomplicated   EVALUATION COMPLEXITY: Low     GOALS: Goals reviewed with patient? Yes   Short term PT Goals (target date for Short term goals are 3 weeks 03/02/2022) Patient will  demonstrate independent use of home exercise program to maintain progress from in clinic treatments. Goal status: on going  - assessed 02/15/2022   Long term PT goals (target dates for all long term goals are 10 weeks  04/20/2022 )   1. Patient will demonstrate/report pain at worst less than or equal to 2/10 to facilitate minimal limitation in daily activity secondary to pain symptoms. Goal status: New   2. Patient will demonstrate independent use of home exercise program to facilitate ability to maintain/progress functional gains from skilled physical therapy  services. Goal status: New   3. Patient will demonstrate FOTO outcome > or = 43 % to indicate reduced disability due to condition. Goal status: New   4.  Patient will demonstrate Rt  knee AROM 0-110 degrees to facilitate ability to perform transfers, sitting, ambulation, stair navigation s restriction due to mobility. Goal status: New   5.  Patient will demonstrate Rt LE MMT 5/5 throughout to facilitate ability to perform usual standing, walking, stairs at PLOF s limitation due to symptoms.   Goal status: New   6.  Patient will demonstrate independent ambulation community distances > 300 ft to facilitate community integration at [facility].   Goal status: New   7.  Patient will demonstrate ascending/descending stairs c reciprocal gait pattern without handrail assist for house entry.  a.  Goal Status: New     PLAN: PT FREQUENCY: 2x-3x /week (3x week for first 1-2 weeks)   PT DURATION: 12 weeks   PLANNED INTERVENTIONS: Therapeutic exercises, Therapeutic activity, Neuro Muscular re-education, Balance training, Gait training, Patient/Family education, Joint mobilization, Stair training, DME instructions, Dry Needling, Electrical stimulation, Cryotherapy, Moist heat, Taping, Ultrasound, Ionotophoresis '4mg'$ /ml Dexamethasone, and Manual therapy.  All included unless contraindicated   PLAN FOR NEXT SESSION: progress functional activities & exercise including Static balance, leg press, continue ROM gains. Vaso to end     Jamey Reas, PT, DPT 02/16/22  4:33 PM

## 2022-02-17 ENCOUNTER — Encounter: Payer: Medicare Other | Admitting: Physical Therapy

## 2022-02-17 ENCOUNTER — Telehealth: Payer: Self-pay | Admitting: Orthopaedic Surgery

## 2022-02-17 ENCOUNTER — Other Ambulatory Visit: Payer: Self-pay | Admitting: Orthopaedic Surgery

## 2022-02-17 NOTE — Telephone Encounter (Signed)
Pt called for a refill on zanaflex.

## 2022-02-17 NOTE — Telephone Encounter (Signed)
Please advise 

## 2022-02-21 ENCOUNTER — Encounter: Payer: Self-pay | Admitting: Rehabilitative and Restorative Service Providers"

## 2022-02-21 ENCOUNTER — Ambulatory Visit (INDEPENDENT_AMBULATORY_CARE_PROVIDER_SITE_OTHER): Payer: Medicare Other | Admitting: Rehabilitative and Restorative Service Providers"

## 2022-02-21 DIAGNOSIS — M6281 Muscle weakness (generalized): Secondary | ICD-10-CM | POA: Diagnosis not present

## 2022-02-21 DIAGNOSIS — R262 Difficulty in walking, not elsewhere classified: Secondary | ICD-10-CM | POA: Diagnosis not present

## 2022-02-21 DIAGNOSIS — M25561 Pain in right knee: Secondary | ICD-10-CM

## 2022-02-21 DIAGNOSIS — M25661 Stiffness of right knee, not elsewhere classified: Secondary | ICD-10-CM

## 2022-02-21 DIAGNOSIS — R600 Localized edema: Secondary | ICD-10-CM

## 2022-02-21 DIAGNOSIS — G8929 Other chronic pain: Secondary | ICD-10-CM | POA: Diagnosis not present

## 2022-02-21 NOTE — Therapy (Signed)
OUTPATIENT PHYSICAL THERAPY TREATMENT NOTE   Patient Name: Elaine Navarro MRN: [id number] DOB:02/14/1946, 76 y.o., female Today's Date: 02/21/2022  PCP: Vernie Shanks, MD REFERRING PROVIDER: Mcarthur Rossetti, MD  END OF SESSION:   PT End of Session - 02/21/22 1138     Visit Number 5    Number of Visits 20    Date for PT Re-Evaluation 04/20/22    Authorization Type Medicare, KX at 15    Progress Note Due on Visit 10    PT Start Time 1138    PT Stop Time 1228    PT Time Calculation (min) 50 min    Activity Tolerance Patient tolerated treatment well    Behavior During Therapy [facility] for tasks assessed/performed                Past Medical History:  Diagnosis Date   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   Hypertension    Left thyroid nodule 2003   S/P biopsy - benign   Lymphocytic colitis 03/2013   Migraines    Osteoporosis    PONV (postoperative nausea and vomiting)    Sciatica    Visceral injury    detached due to recent fall   Past Surgical History:  Procedure Laterality Date   ABDOMINOPLASTY  2010   [place] EXCISIONAL BIOPSY Bilateral 2001   x 3   CATARACT EXTRACTION, BILATERAL Bilateral 01/2020   Dental implants  1993   HEMORRHOID SURGERY  2000   KNEE ARTHROSCOPY W/ DEBRIDEMENT Right 07/2020   Dr. Berenice Primas   TOTAL KNEE ARTHROPLASTY Right 01/21/2022   Procedure: RIGHT TOTAL KNEE ARTHROPLASTY;  Surgeon: Mcarthur Rossetti, MD;  Location: [facility];  Service: Orthopedics;  Laterality: Right;  RFNA   VAGINAL HYSTERECTOMY  1974   Dysfunctional uterine bleeding.   Patient Active Problem List   Diagnosis Date Noted   Status post right knee replacement 01/21/2022   Arthritis of right knee 01/20/2022   Lung nodules 12/28/2021   Primary hypertension 05/13/2021   Thyroid nodule 05/13/2021   History of depression 05/13/2021   H/O:  hysterectomy 05/13/2021   Postmenopausal 05/13/2021   Piriformis syndrome of left side 04/07/2021   Chronic pain of right knee 03/04/2021   H/O sciatica 11/04/2020   Age-related osteoporosis without current pathological fracture 11/04/2020   Vitamin D insufficiency 12/20/2019   Actinic keratosis 03/21/2017   Focal lymphocytic colitis 05/29/2013   Aneurysm artery, renal ([place]) 09/28/2012   Headache, chronic migraine without aura, intractable 03/02/2012    REFERRING DIAG:  F62.130 (ICD-10-CM) - Status post right knee replacement  THERAPY DIAG:  Chronic pain of right knee  Muscle weakness (generalized)  Difficulty in walking, not elsewhere classified  Stiffness of right knee, not elsewhere classified  Localized edema  Rationale for Evaluation and Treatment Rehabilitation  PERTINENT HISTORY: Osteoporosis, HTN  PRECAUTIONS: None  SUBJECTIVE: Pt indicated no pain upon arrival today.  Pt indicated she took some time off from exercise and woke up and felt stiffer than when she had done the HEP.    PAIN:  NPRS scale:  0/10 pain in knee upon arrival.  Pain location: Rt knee, Rt leg Pain description: Ache, sore Aggravating factors: Prolonged postures,consistent through day Relieving factors: Ice, exercises, pain meds   OBJECTIVE:    PATIENT SURVEYS:  02/09/2022 FOTO intake  21  predicted 40    COGNITION: 02/09/2022  Overall cognitive status: Within functional limits for tasks assessed                              EDEMA:  02/09/2022  Localized edema noted Rt knee/leg   PALPATION: 02/09/2022 Tenderness noted anterior knee joint bilateral, incision region, posterior knee/distal thigh Rt   LOWER EXTREMITY ROM:   Active ROM Right Eval 02/09/22 Left Eval 02/09/22 Right 02/10/22 Right 02/15/22 Right 02/21/2022  Hip flexion         Hip extension         Hip abduction         Hip adduction         Hip internal rotation         Hip external rotation         Knee flexion AROM  in supine heel slide  67    AROM supine 71 AROM supine 84 AROM Supine 95  Knee extension -10 in seated LAQ AROM   -6 in supine heel prop PROM   AROM supine -5  AROM supine quad set - 0 degrees  Ankle dorsiflexion         Ankle plantarflexion         Ankle inversion         Ankle eversion          (Blank rows = not tested)   LOWER EXTREMITY MMT:   MMT Right Eval 02/09/22 Left Eval 02/09/22  Hip flexion 4/5 5/5  Hip extension      Hip abduction      Hip adduction      Hip internal rotation      Hip external rotation      Knee flexion 4/5 5/5  Knee extension 3+/5 5/5  Ankle dorsiflexion 5/5 5/5  Ankle plantarflexion      Ankle inversion      Ankle eversion       (Blank rows = not tested)   FUNCTIONAL TESTS:  02/09/2022  18 inch chair transfer: able to perform s UE assist but most WB on Lt leg Lt SLS:             15 seconds     Rt SLS: 3 seconds   GAIT: 02/21/2022:  Use of SPC in Lt UE while in clinic c supervision provided  02/09/2022  Distance walked: Household distances Assistive device utilized:  [employer] Comments: maintained mid range knee flexion throughout, reduced stance on Rt c limited hip extension/terminal stance phase.       TODAY'S TREATMENT: 02/21/2022: Therex:  UBE partial circles LE 3 mins, full revolutions forward 5 mins lvl 1.0 c UE assist in movement 8 mins  Incline board gastroc bilateral 30 sec x 3  Leg press double leg 50 lbs x 18 , Rt single leg 25 lbs x 15  Supine AROM Rt heel slide 2-3 sec hold x 10   Seated LAQ 3 lb c pause in extension and flexion end ranges 2 x 15  Neuro re-ed  Retro step over 4 inch block to encourage knee flexion x 15 bilateral, SBA  Tandem stance 1 min x 1 bilateral c SBA  Vasopneumatic  Rt knee 10 mins 34 deg medium compression  02/16/2022 Therex:  Nustep seat 4 with BLEs & BUEs level 5 with focus on right knee flexion for 8 mn.    Seated alternating isometric flexion/extension holds Rt  knee in mid range 5 sec each  direction x 12 each  Seated Heel slides with Rt foot on pillow case ext quad set 5 sec & flex with end range LLE assist 5 sec for 15 reps.    Supine AAROM foot on 55cm ball with strap assist heel slide c quad set combo 5 sec hold each x 10  Supine single knee to opposite shoulder with 55cm ball & strap 30 sec x 2  Supine bridge x 10 reps - 5 sec hold Supine SLR with quad set 10 reps.   Neuro re-ed  [facility] pew anterior/posterior ankle strategy 2 mins  Retro step x 20 bilateral  Gait  Pre-gait weight shift with LLE in initial contact for weight acceptance & terminal stance for knee flexion. Pt performed 15 reps ea with cane & mat table support with tactile cues.   SPC with tactile cues for house hold distances < 150 ft within clinic throughout visit c sequencing and cues for placement, SBA.    Manual:   Seated Rt knee mobilization c movement for Rt knee flexion c contralateral leg movement opposite  Modalities:  Vaso left knee medium compression 34* 10 min with elevation  02/15/2022:  Therex:   Seated alternating isometric flexion/extension holds Rt knee in mid range 5 sec each direction x 12 each   Seated Rt knee flexion c Lt leg overpressure 15 sec x 3   Supine AROM heel slide c quad set combo 5 sec hold each x 10   Supine single knee to opposite shoulder 30 sec x 2 (instruction for home)   Supine bridge x 5 - 5 sec hold Seated gastroc stretch with strap Rt leg 30 sec x1   Neuro re-ed   [employer] pew anterior/posterior ankle strategy 2 mins   Retro step x20 bilateral   Gait   SPC Lt UE house hold distances < 150 ft within clinic throughout visit c sequencing and cues for placement, SBA.   Manual:   Seated Rt knee mobilization c movement for Rt knee flexion c contralateral leg movement opposite Modalities:   Vaso left knee medium compression 34* 10 min with elevation   HOME EXERCISE PROGRAM: Access Code: [email or web address] URL: [email or web address] Date:  02/15/2022 Prepared by: Scot Jun  Exercises - Supine Heel Slide with Strap (Mirrored)  - 2-3 x daily - 7 x weekly - 1 sets - 10 reps - 5 hold - Seated Long Arc Quad  - 3-5 x daily - 7 x weekly - 1-2 sets - 10 reps - 2 hold - Seated Quad Set  - 3-5 x daily - 7 x weekly - 1 sets - 10 reps - 5 hold - Seated Passive Knee Extension with Weight  - 5 x daily - 7 x weekly - 1 sets - 1 reps - to tolerance hold - Seated Knee Flexion AAROM  - 3-5 x daily - 7 x weekly - 1 sets - 1 reps - 3 minutes hold - [employer]  - 3-5 x daily - 7 x weekly - 1 sets - 1-2 mins hold   ASSESSMENT:   CLINICAL IMPRESSION: Continue progression in AROM as noted today.  SPC use in clinic acceptable and safe with deviations in stance phase and step lengths.  Continued skilled PT services indicated to progress mobility, strength and balance for functional activity improvements.    OBJECTIVE IMPAIRMENTS Abnormal gait, decreased activity tolerance, decreased balance, decreased coordination, decreased endurance, decreased mobility, difficulty walking, decreased ROM, decreased strength, hypomobility,  increased edema, impaired perceived functional ability, impaired flexibility, improper body mechanics, and pain.    ACTIVITY LIMITATIONS carrying, lifting, bending, sitting, standing, squatting, sleeping, stairs, transfers, bed mobility, and locomotion level   PARTICIPATION LIMITATIONS: cleaning, laundry, driving, shopping, community activity, and yard work   PERSONAL FACTORS  Osteoporosis, HTN  are also affecting patient's functional outcome.    REHAB POTENTIAL: Good   CLINICAL DECISION MAKING: Stable/uncomplicated   EVALUATION COMPLEXITY: Low     GOALS: Goals reviewed with patient? Yes   Short term PT Goals (target date for Short term goals are 3 weeks 03/02/2022) Patient will demonstrate independent use of home exercise program to maintain progress from in clinic treatments. Goal status: on going  - assessed  02/15/2022   Long term PT goals (target dates for all long term goals are 10 weeks  04/20/2022 )   1. Patient will demonstrate/report pain at worst less than or equal to 2/10 to facilitate minimal limitation in daily activity secondary to pain symptoms. Goal status: New   2. Patient will demonstrate independent use of home exercise program to facilitate ability to maintain/progress functional gains from skilled physical therapy services. Goal status: New   3. Patient will demonstrate FOTO outcome > or = 43 % to indicate reduced disability due to condition. Goal status: New   4.  Patient will demonstrate Rt  knee AROM 0-110 degrees to facilitate ability to perform transfers, sitting, ambulation, stair navigation s restriction due to mobility. Goal status: New   5.  Patient will demonstrate Rt LE MMT 5/5 throughout to facilitate ability to perform usual standing, walking, stairs at PLOF s limitation due to symptoms.   Goal status: New   6.  Patient will demonstrate independent ambulation community distances > 300 ft to facilitate community integration at [facility].   Goal status: New   7.  Patient will demonstrate ascending/descending stairs c reciprocal gait pattern without handrail assist for house entry.  a.  Goal Status: New     PLAN: PT FREQUENCY: 2x-3x /week (3x week for first 1-2 weeks)   PT DURATION: 12 weeks   PLANNED INTERVENTIONS: Therapeutic exercises, Therapeutic activity, Neuro Muscular re-education, Balance training, Gait training, Patient/Family education, Joint mobilization, Stair training, DME instructions, Dry Needling, Electrical stimulation, Cryotherapy, Moist heat, Taping, Ultrasound, Ionotophoresis '4mg'$ /ml Dexamethasone, and Manual therapy.  All included unless contraindicated   PLAN FOR NEXT SESSION: Static and dynamic balance improvements, WB strengthening, mobility gains for flexion.  LTGs assessment.   Scot Jun, PT, DPT, OCS, ATC 02/21/22  12:28 PM

## 2022-02-23 ENCOUNTER — Ambulatory Visit (INDEPENDENT_AMBULATORY_CARE_PROVIDER_SITE_OTHER): Payer: Medicare Other | Admitting: Rehabilitative and Restorative Service Providers"

## 2022-02-23 ENCOUNTER — Encounter: Payer: Self-pay | Admitting: Rehabilitative and Restorative Service Providers"

## 2022-02-23 DIAGNOSIS — R262 Difficulty in walking, not elsewhere classified: Secondary | ICD-10-CM | POA: Diagnosis not present

## 2022-02-23 DIAGNOSIS — M71552 Other bursitis, not elsewhere classified, left hip: Secondary | ICD-10-CM | POA: Diagnosis not present

## 2022-02-23 DIAGNOSIS — R600 Localized edema: Secondary | ICD-10-CM | POA: Diagnosis not present

## 2022-02-23 DIAGNOSIS — M6281 Muscle weakness (generalized): Secondary | ICD-10-CM

## 2022-02-23 DIAGNOSIS — M25661 Stiffness of right knee, not elsewhere classified: Secondary | ICD-10-CM | POA: Diagnosis not present

## 2022-02-23 DIAGNOSIS — M461 Sacroiliitis, not elsewhere classified: Secondary | ICD-10-CM | POA: Diagnosis not present

## 2022-02-23 DIAGNOSIS — G8929 Other chronic pain: Secondary | ICD-10-CM | POA: Diagnosis not present

## 2022-02-23 DIAGNOSIS — M25561 Pain in right knee: Secondary | ICD-10-CM | POA: Diagnosis not present

## 2022-02-23 DIAGNOSIS — G894 Chronic pain syndrome: Secondary | ICD-10-CM | POA: Diagnosis not present

## 2022-02-23 NOTE — Therapy (Signed)
OUTPATIENT PHYSICAL THERAPY TREATMENT NOTE   Patient Name: Elaine Navarro MRN: [id number] DOB:05-30-1946, 76 y.o., female Today's Date: 02/23/2022  PCP: Vernie Shanks, MD REFERRING PROVIDER: Mcarthur Rossetti, MD  END OF SESSION:   PT End of Session - 02/23/22 1148     Visit Number 6    Number of Visits 20    Date for PT Re-Evaluation 04/20/22    Authorization Type Medicare, KX at 15    Progress Note Due on Visit 10    PT Start Time 1140    PT Stop Time 1230    PT Time Calculation (min) 50 min    Activity Tolerance Patient tolerated treatment well    Behavior During Therapy [facility] for tasks assessed/performed                 Past Medical History:  Diagnosis Date   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   Hypertension    Left thyroid nodule 2003   S/P biopsy - benign   Lymphocytic colitis 03/2013   Migraines    Osteoporosis    PONV (postoperative nausea and vomiting)    Sciatica    Visceral injury    detached due to recent fall   Past Surgical History:  Procedure Laterality Date   ABDOMINOPLASTY  2010   [place] EXCISIONAL BIOPSY Bilateral 2001   x 3   CATARACT EXTRACTION, BILATERAL Bilateral 01/2020   Dental implants  1993   HEMORRHOID SURGERY  2000   KNEE ARTHROSCOPY W/ DEBRIDEMENT Right 07/2020   Dr. Berenice Primas   TOTAL KNEE ARTHROPLASTY Right 01/21/2022   Procedure: RIGHT TOTAL KNEE ARTHROPLASTY;  Surgeon: Mcarthur Rossetti, MD;  Location: [facility];  Service: Orthopedics;  Laterality: Right;  RFNA   VAGINAL HYSTERECTOMY  1974   Dysfunctional uterine bleeding.   Patient Active Problem List   Diagnosis Date Noted   Status post right knee replacement 01/21/2022   Arthritis of right knee 01/20/2022   Lung nodules 12/28/2021   Primary hypertension 05/13/2021   Thyroid nodule 05/13/2021   History of depression 05/13/2021    H/O: hysterectomy 05/13/2021   Postmenopausal 05/13/2021   Piriformis syndrome of left side 04/07/2021   Chronic pain of right knee 03/04/2021   H/O sciatica 11/04/2020   Age-related osteoporosis without current pathological fracture 11/04/2020   Vitamin D insufficiency 12/20/2019   Actinic keratosis 03/21/2017   Focal lymphocytic colitis 05/29/2013   Aneurysm artery, renal ([place]) 09/28/2012   Headache, chronic migraine without aura, intractable 03/02/2012    REFERRING DIAG:  R41.638 (ICD-10-CM) - Status post right knee replacement  THERAPY DIAG:  Chronic pain of right knee  Muscle weakness (generalized)  Difficulty in walking, not elsewhere classified  Stiffness of right knee, not elsewhere classified  Localized edema  Rationale for Evaluation and Treatment Rehabilitation  PERTINENT HISTORY: Osteoporosis, HTN  PRECAUTIONS: None  SUBJECTIVE:   Pt stated doing a lot of walking the other day and had swelling increase that night.  Pt indicated a little pain upon arrival.      PAIN:  NPRS scale:  3-4/10 pain in knee upon arrival.  Pain location: Rt knee, Rt leg Pain description: Ache, sore Aggravating factors: Prolonged walking Relieving factors: Ice, exercises, pain meds   OBJECTIVE:    PATIENT SURVEYS:  02/09/2022 FOTO intake  21    predicted 43  COGNITION: 02/09/2022  Overall cognitive status: Within functional limits for tasks assessed                              EDEMA:  02/09/2022  Localized edema noted Rt knee/leg   PALPATION: 02/09/2022 Tenderness noted anterior knee joint bilateral, incision region, posterior knee/distal thigh Rt   LOWER EXTREMITY ROM:   Active ROM Right Eval 02/09/22 Left Eval 02/09/22 Right 02/10/22 Right 02/15/22 Right 02/21/2022  Hip flexion         Hip extension         Hip abduction         Hip adduction         Hip internal rotation         Hip external rotation         Knee flexion AROM in supine heel slide  67    AROM supine  71 AROM supine 84 AROM Supine 95  Knee extension -10 in seated LAQ AROM   -6 in supine heel prop PROM   AROM supine -5  AROM supine quad set - 0 degrees  Ankle dorsiflexion         Ankle plantarflexion         Ankle inversion         Ankle eversion          (Blank rows = not tested)   LOWER EXTREMITY MMT:   MMT Right Eval 02/09/22 Left Eval 02/09/22  Hip flexion 4/5 5/5  Hip extension      Hip abduction      Hip adduction      Hip internal rotation      Hip external rotation      Knee flexion 4/5 5/5  Knee extension 3+/5 5/5  Ankle dorsiflexion 5/5 5/5  Ankle plantarflexion      Ankle inversion      Ankle eversion       (Blank rows = not tested)   FUNCTIONAL TESTS:  02/09/2022  18 inch chair transfer: able to perform s UE assist but most WB on Lt leg Lt SLS:             15 seconds     Rt SLS: 3 seconds   GAIT: 02/21/2022:  Use of SPC in Lt UE while in clinic c supervision provided  02/09/2022  Distance walked: Household distances Assistive device utilized:  [employer] Comments: maintained mid range knee flexion throughout, reduced stance on Rt c limited hip extension/terminal stance phase.       TODAY'S TREATMENT: 02/23/2022: Therex:  UBE UE/LE full range 8 mins lvl 1.0  Incline board gastroc bilateral 30 sec x 3  Leg press double leg 56 lbs x 18 , Rt single leg 31 lbs 2 x 15  Supine AROM Rt heel slide/SLR combo x 15  Seated LAQ 4 lb c pause in extension and flexion end ranges 2 x 15     Neuro re-ed  Tandem stance 1 min x 1 bilateral c SBA  Tandem ambulation in // bars 10 ft x 8 fwd  [employer] pew anterior/posterior ankle strategy on floor 2 mins  Manual:  Supine Rt knee flexion c distraction/IR for knee mobility gains.  G3 IR/ER proximal tibia mobilizations  Vasopneumatic  Rt knee 10 mins 34 deg medium compression  02/21/2022: Therex:  UBE partial circles LE 3 mins, full revolutions forward 5 mins lvl 1.0 c UE assist in movement  8 mins  Incline board gastroc  bilateral 30 sec x 3  Leg press double leg 50 lbs x 18 , Rt single leg 25 lbs x 15  Supine AROM Rt heel slide 2-3 sec hold x 10   Seated LAQ 3 lb c pause in extension and flexion end ranges 2 x 15  Neuro re-ed  Retro step over 4 inch block to encourage knee flexion x 15 bilateral, SBA  Tandem stance 1 min x 1 bilateral c SBA  Vasopneumatic  Rt knee 10 mins 34 deg medium compression  02/16/2022 Therex:  Nustep seat 4 with BLEs & BUEs level 5 with focus on right knee flexion for 8 mn.    Seated alternating isometric flexion/extension holds Rt knee in mid range 5 sec each direction x 12 each  Seated Heel slides with Rt foot on pillow case ext quad set 5 sec & flex with end range LLE assist 5 sec for 15 reps.    Supine AAROM foot on 55cm ball with strap assist heel slide c quad set combo 5 sec hold each x 10  Supine single knee to opposite shoulder with 55cm ball & strap 30 sec x 2  Supine bridge x 10 reps - 5 sec hold Supine SLR with quad set 10 reps.   Neuro re-ed  [facility] pew anterior/posterior ankle strategy 2 mins  Retro step x 20 bilateral  Gait  Pre-gait weight shift with LLE in initial contact for weight acceptance & terminal stance for knee flexion. Pt performed 15 reps ea with cane & mat table support with tactile cues.   SPC with tactile cues for house hold distances < 150 ft within clinic throughout visit c sequencing and cues for placement, SBA.    Manual:   Seated Rt knee mobilization c movement for Rt knee flexion c contralateral leg movement opposite  Modalities:  Vaso left knee medium compression 34* 10 min with elevation   HOME EXERCISE PROGRAM: Access Code: [email or web address] URL: [email or web address] Date: 02/15/2022 Prepared by: Scot Jun  Exercises - Supine Heel Slide with Strap (Mirrored)  - 2-3 x daily - 7 x weekly - 1 sets - 10 reps - 5 hold - Seated Long Arc Quad  - 3-5 x daily - 7 x weekly - 1-2 sets - 10 reps - 2 hold - Seated Quad Set  -  3-5 x daily - 7 x weekly - 1 sets - 10 reps - 5 hold - Seated Passive Knee Extension with Weight  - 5 x daily - 7 x weekly - 1 sets - 1 reps - to tolerance hold - Seated Knee Flexion AAROM  - 3-5 x daily - 7 x weekly - 1 sets - 1 reps - 3 minutes hold - [employer]  - 3-5 x daily - 7 x weekly - 1 sets - 1-2 mins hold   ASSESSMENT:   CLINICAL IMPRESSION: SPC use to and in clinic today.  Discussion about moderation in activity as she improves to avoid exacerbation of swelling response by too much activity.  Continued progressive strengthening and balance improved to aid patient presentation and gains towards goals.    OBJECTIVE IMPAIRMENTS Abnormal gait, decreased activity tolerance, decreased balance, decreased coordination, decreased endurance, decreased mobility, difficulty walking, decreased ROM, decreased strength, hypomobility, increased edema, impaired perceived functional ability, impaired flexibility, improper body mechanics, and pain.    ACTIVITY LIMITATIONS carrying, lifting, bending, sitting, standing, squatting, sleeping, stairs, transfers, bed mobility, and locomotion level   PARTICIPATION LIMITATIONS: cleaning,  laundry, driving, shopping, community activity, and yard work   PERSONAL FACTORS  Osteoporosis, HTN  are also affecting patient's functional outcome.    REHAB POTENTIAL: Good   CLINICAL DECISION MAKING: Stable/uncomplicated   EVALUATION COMPLEXITY: Low     GOALS: Goals reviewed with patient? Yes   Short term PT Goals (target date for Short term goals are 3 weeks 03/02/2022) Patient will demonstrate independent use of home exercise program to maintain progress from in clinic treatments. Goal status: MET - assessed 02/23/2022   Long term PT goals (target dates for all long term goals are 10 weeks  04/20/2022 )   1. Patient will demonstrate/report pain at worst less than or equal to 2/10 to facilitate minimal limitation in daily activity secondary to pain symptoms. Goal  status: New   2. Patient will demonstrate independent use of home exercise program to facilitate ability to maintain/progress functional gains from skilled physical therapy services. Goal status: New   3. Patient will demonstrate FOTO outcome > or = 43 % to indicate reduced disability due to condition. Goal status: New   4.  Patient will demonstrate Rt  knee AROM 0-110 degrees to facilitate ability to perform transfers, sitting, ambulation, stair navigation s restriction due to mobility. Goal status: New   5.  Patient will demonstrate Rt LE MMT 5/5 throughout to facilitate ability to perform usual standing, walking, stairs at PLOF s limitation due to symptoms.   Goal status: New   6.  Patient will demonstrate independent ambulation community distances > 300 ft to facilitate community integration at [facility]   Goal status: New   7.  Patient will demonstrate ascending/descending stairs c reciprocal gait pattern without handrail assist for house entry.  a.  Goal Status: New     PLAN: PT FREQUENCY: 2x-3x /week (3x week for first 1-2 weeks)   PT DURATION: 12 weeks   PLANNED INTERVENTIONS: Therapeutic exercises, Therapeutic activity, Neuro Muscular re-education, Balance training, Gait training, Patient/Family education, Joint mobilization, Stair training, DME instructions, Dry Needling, Electrical stimulation, Cryotherapy, Moist heat, Taping, Ultrasound, Ionotophoresis 75m/ml Dexamethasone, and Manual therapy.  All included unless contraindicated   PLAN FOR NEXT SESSION: Progressive strengthening and balance improvements.   Flexion mobility gains in manual and ther ex.   MScot Jun PT, DPT, OCS, ATC 02/23/22  12:21 PM

## 2022-02-25 ENCOUNTER — Encounter: Payer: Self-pay | Admitting: Rehabilitative and Restorative Service Providers"

## 2022-02-25 ENCOUNTER — Ambulatory Visit (INDEPENDENT_AMBULATORY_CARE_PROVIDER_SITE_OTHER): Payer: Medicare Other | Admitting: Rehabilitative and Restorative Service Providers"

## 2022-02-25 DIAGNOSIS — M6281 Muscle weakness (generalized): Secondary | ICD-10-CM | POA: Diagnosis not present

## 2022-02-25 DIAGNOSIS — R600 Localized edema: Secondary | ICD-10-CM | POA: Diagnosis not present

## 2022-02-25 DIAGNOSIS — R262 Difficulty in walking, not elsewhere classified: Secondary | ICD-10-CM

## 2022-02-25 DIAGNOSIS — G8929 Other chronic pain: Secondary | ICD-10-CM | POA: Diagnosis not present

## 2022-02-25 DIAGNOSIS — M25661 Stiffness of right knee, not elsewhere classified: Secondary | ICD-10-CM

## 2022-02-25 DIAGNOSIS — M25561 Pain in right knee: Secondary | ICD-10-CM

## 2022-02-25 NOTE — Therapy (Signed)
OUTPATIENT PHYSICAL THERAPY TREATMENT NOTE   Patient Name: Elaine Navarro MRN: [id number] DOB:07-Oct-1945, 76 y.o., female Today's Date: 02/25/2022  PCP: Vernie Shanks, MD REFERRING PROVIDER: Mcarthur Rossetti, MD  END OF SESSION:   PT End of Session - 02/25/22 1141     Visit Number 7    Number of Visits 20    Date for PT Re-Evaluation 04/20/22    Authorization Type Medicare, KX at 15    Progress Note Due on Visit 10    PT Start Time 1137    PT Stop Time 1227    PT Time Calculation (min) 50 min    Activity Tolerance Patient tolerated treatment well    Behavior During Therapy [facility] for tasks assessed/performed                  Past Medical History:  Diagnosis Date   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   Hypertension    Left thyroid nodule 2003   S/P biopsy - benign   Lymphocytic colitis 03/2013   Migraines    Osteoporosis    PONV (postoperative nausea and vomiting)    Sciatica    Visceral injury    detached due to recent fall   Past Surgical History:  Procedure Laterality Date   ABDOMINOPLASTY  2010   [place] EXCISIONAL BIOPSY Bilateral 2001   x 3   CATARACT EXTRACTION, BILATERAL Bilateral 01/2020   Dental implants  1993   HEMORRHOID SURGERY  2000   KNEE ARTHROSCOPY W/ DEBRIDEMENT Right 07/2020   Dr. Berenice Primas   TOTAL KNEE ARTHROPLASTY Right 01/21/2022   Procedure: RIGHT TOTAL KNEE ARTHROPLASTY;  Surgeon: Mcarthur Rossetti, MD;  Location: [facility];  Service: Orthopedics;  Laterality: Right;  RFNA   VAGINAL HYSTERECTOMY  1974   Dysfunctional uterine bleeding.   Patient Active Problem List   Diagnosis Date Noted   Status post right knee replacement 01/21/2022   Arthritis of right knee 01/20/2022   Lung nodules 12/28/2021   Primary hypertension 05/13/2021   Thyroid nodule 05/13/2021   History of depression 05/13/2021    H/O: hysterectomy 05/13/2021   Postmenopausal 05/13/2021   Piriformis syndrome of left side 04/07/2021   Chronic pain of right knee 03/04/2021   H/O sciatica 11/04/2020   Age-related osteoporosis without current pathological fracture 11/04/2020   Vitamin D insufficiency 12/20/2019   Actinic keratosis 03/21/2017   Focal lymphocytic colitis 05/29/2013   Aneurysm artery, renal ([place]) 09/28/2012   Headache, chronic migraine without aura, intractable 03/02/2012    REFERRING DIAG:  S28.315 (ICD-10-CM) - Status post right knee replacement  THERAPY DIAG:  Chronic pain of right knee  Muscle weakness (generalized)  Difficulty in walking, not elsewhere classified  Stiffness of right knee, not elsewhere classified  Localized edema  Rationale for Evaluation and Treatment Rehabilitation  PERTINENT HISTORY: Osteoporosis, HTN  PRECAUTIONS: None  SUBJECTIVE:   Pt indicated not painful just stiffness mainly.  Reported walking more with cane at this time. Pt indicated the Rt ankle has been hurting at night.   PAIN:  NPRS scale:  0/10 upon arrival in Rt knee Pain location: Rt knee, Rt leg Pain description: Ache, sore Aggravating factors: Prolonged walking Relieving factors: Ice, exercises, pain meds   OBJECTIVE:    PATIENT SURVEYS:  02/09/2022 FOTO intake  21    predicted 43  COGNITION: 02/09/2022  Overall cognitive status: Within functional limits for tasks assessed                              EDEMA:  02/09/2022  Localized edema noted Rt knee/leg   PALPATION: 02/09/2022 Tenderness noted anterior knee joint bilateral, incision region, posterior knee/distal thigh Rt   LOWER EXTREMITY ROM:   Active ROM Right Eval 02/09/22 Left Eval 02/09/22 Right 02/10/22 Right 02/15/22 Right 02/21/2022  Hip flexion         Hip extension         Hip abduction         Hip adduction         Hip internal rotation         Hip external rotation         Knee flexion AROM in supine heel slide  67     AROM supine 71 AROM supine 84 AROM Supine 95  Knee extension -10 in seated LAQ AROM   -6 in supine heel prop PROM   AROM supine -5  AROM supine quad set - 0 degrees  Ankle dorsiflexion         Ankle plantarflexion         Ankle inversion         Ankle eversion          (Blank rows = not tested)   LOWER EXTREMITY MMT:   MMT Right Eval 02/09/22 Left Eval 02/09/22  Hip flexion 4/5 5/5  Hip extension      Hip abduction      Hip adduction      Hip internal rotation      Hip external rotation      Knee flexion 4/5 5/5  Knee extension 3+/5 5/5  Ankle dorsiflexion 5/5 5/5  Ankle plantarflexion      Ankle inversion      Ankle eversion       (Blank rows = not tested)   FUNCTIONAL TESTS:  02/09/2022  18 inch chair transfer: able to perform s UE assist but most WB on Lt leg Lt SLS:             15 seconds     Rt SLS: 3 seconds   GAIT: 02/21/2022:  Use of SPC in Lt UE while in clinic c supervision provided  02/09/2022  Distance walked: Household distances Assistive device utilized:  [employer] Comments: maintained mid range knee flexion throughout, reduced stance on Rt c limited hip extension/terminal stance phase.       TODAY'S TREATMENT: 02/25/2022: Therex:  UBE UE/LE full range 10 mins lvl 2.0  Incline board gastroc bilateral 30 sec x 3  Seated Rt knee flexion c overpressure from Lt leg 15 sec hold x 5  Seated LAQ 5 lb c pause in extension and flexion end ranges 2 x 15 Seated SLR c quad set emphasis 3 x 5 on Rt  TherActivity (to improve functional movements, stairs, transfers)  Fwd step up on Rt leg no hands x 15 4 inch step   Lateral step up /down on Rt leg no hands x 15 4 inch step (cues for slow lowering)    Neuro re-ed  Tandem stance 1 min x 2 bilateral on foam c SBA  Retro step Rt leg posterior x 20  Manual:  Supine Rt knee flexion c distraction/IR for knee mobility gains.  G4 IR/ER proximal tibia mobilizations.  Contract/relax to Rt  quad for knee flexion  mobility  Vasopneumatic  Rt knee 10 mins 34 deg medium compression  02/23/2022: Therex:  UBE UE/LE full range 8 mins lvl 1.0  Incline board gastroc bilateral 30 sec x 3  Leg press double leg 56 lbs x 18 , Rt single leg 31 lbs 2 x 15  Supine AROM Rt heel slide/SLR combo x 15  Seated LAQ 4 lb c pause in extension and flexion end ranges 2 x 15     Neuro re-ed  Tandem stance 1 min x 1 bilateral c SBA  Tandem ambulation in // bars 10 ft x 8 fwd  [employer] pew anterior/posterior ankle strategy on floor 2 mins  Manual:  Supine Rt knee flexion c distraction/IR for knee mobility gains.  G3 IR/ER proximal tibia mobilizations  Vasopneumatic  Rt knee 10 mins 34 deg medium compression  02/21/2022: Therex:  UBE partial circles LE 3 mins, full revolutions forward 5 mins lvl 1.0 c UE assist in movement 8 mins  Incline board gastroc bilateral 30 sec x 3  Leg press double leg 50 lbs x 18 , Rt single leg 25 lbs x 15  Supine AROM Rt heel slide 2-3 sec hold x 10   Seated LAQ 3 lb c pause in extension and flexion end ranges 2 x 15  Neuro re-ed  Retro step over 4 inch block to encourage knee flexion x 15 bilateral, SBA  Tandem stance 1 min x 1 bilateral c SBA  Vasopneumatic  Rt knee 10 mins 34 deg medium compression   HOME EXERCISE PROGRAM: Access Code: [email or web address] URL: [email or web address] Date: 02/15/2022 Prepared by: Scot Jun  Exercises - Supine Heel Slide with Strap (Mirrored)  - 2-3 x daily - 7 x weekly - 1 sets - 10 reps - 5 hold - Seated Long Arc Quad  - 3-5 x daily - 7 x weekly - 1-2 sets - 10 reps - 2 hold - Seated Quad Set  - 3-5 x daily - 7 x weekly - 1 sets - 10 reps - 5 hold - Seated Passive Knee Extension with Weight  - 5 x daily - 7 x weekly - 1 sets - 1 reps - to tolerance hold - Seated Knee Flexion AAROM  - 3-5 x daily - 7 x weekly - 1 sets - 1 reps - 3 minutes hold - [employer]  - 3-5 x daily - 7 x weekly - 1 sets - 1-2 mins hold   ASSESSMENT:    CLINICAL IMPRESSION: Improving control in ambulation, performing household distances within clinic independently from cane use.  Continued end range pain/tightness for flexion but progression over time noted.  Continued skilled PT services indicated to address mobility, strength and movement coordination deficits.    OBJECTIVE IMPAIRMENTS Abnormal gait, decreased activity tolerance, decreased balance, decreased coordination, decreased endurance, decreased mobility, difficulty walking, decreased ROM, decreased strength, hypomobility, increased edema, impaired perceived functional ability, impaired flexibility, improper body mechanics, and pain.    ACTIVITY LIMITATIONS carrying, lifting, bending, sitting, standing, squatting, sleeping, stairs, transfers, bed mobility, and locomotion level   PARTICIPATION LIMITATIONS: cleaning, laundry, driving, shopping, community activity, and yard work   PERSONAL FACTORS  Osteoporosis, HTN  are also affecting patient's functional outcome.    REHAB POTENTIAL: Good   CLINICAL DECISION MAKING: Stable/uncomplicated   EVALUATION COMPLEXITY: Low     GOALS: Goals reviewed with patient? Yes   Short term PT Goals (target date for Short term goals are 3 weeks 03/02/2022) Patient will demonstrate independent  use of home exercise program to maintain progress from in clinic treatments. Goal status: MET - assessed 02/23/2022   Long term PT goals (target dates for all long term goals are 10 weeks  04/20/2022 )   1. Patient will demonstrate/report pain at worst less than or equal to 2/10 to facilitate minimal limitation in daily activity secondary to pain symptoms. Goal status: on going - assessed 02/25/2022   2. Patient will demonstrate independent use of home exercise program to facilitate ability to maintain/progress functional gains from skilled physical therapy services. Goal status:  on going - assessed 02/25/2022   3. Patient will demonstrate FOTO outcome > or =  43 % to indicate reduced disability due to condition. Goal status:  on going - assessed 02/25/2022   4.  Patient will demonstrate Rt  knee AROM 0-110 degrees to facilitate ability to perform transfers, sitting, ambulation, stair navigation s restriction due to mobility. Goal status:  on going - assessed 02/25/2022   5.  Patient will demonstrate Rt LE MMT 5/5 throughout to facilitate ability to perform usual standing, walking, stairs at PLOF s limitation due to symptoms.   Goal status:  on going - assessed 02/25/2022   6.  Patient will demonstrate independent ambulation community distances > 300 ft to facilitate community integration at [facility].   Goal status:  on going - assessed 02/25/2022   7.  Patient will demonstrate ascending/descending stairs c reciprocal gait pattern without handrail assist for house entry.  a.  Goal Status:  on going - assessed 02/25/2022     PLAN: PT FREQUENCY: 2x-3x /week (3x week for first 1-2 weeks)   PT DURATION: 12 weeks   PLANNED INTERVENTIONS: Therapeutic exercises, Therapeutic activity, Neuro Muscular re-education, Balance training, Gait training, Patient/Family education, Joint mobilization, Stair training, DME instructions, Dry Needling, Electrical stimulation, Cryotherapy, Moist heat, Taping, Ultrasound, Ionotophoresis 32m/ml Dexamethasone, and Manual therapy.  All included unless contraindicated   PLAN FOR NEXT SESSION:  Independent ambulation within clinic.  Dynamic balance (lateral/reverse stepping).  Progressive mobility/strengthening continued.    MScot Jun PT, DPT, OCS, ATC 02/25/22  12:17 PM

## 2022-02-28 ENCOUNTER — Ambulatory Visit (INDEPENDENT_AMBULATORY_CARE_PROVIDER_SITE_OTHER): Payer: Medicare Other | Admitting: Rehabilitative and Restorative Service Providers"

## 2022-02-28 ENCOUNTER — Encounter: Payer: Self-pay | Admitting: Rehabilitative and Restorative Service Providers"

## 2022-02-28 DIAGNOSIS — G8929 Other chronic pain: Secondary | ICD-10-CM | POA: Diagnosis not present

## 2022-02-28 DIAGNOSIS — M25661 Stiffness of right knee, not elsewhere classified: Secondary | ICD-10-CM | POA: Diagnosis not present

## 2022-02-28 DIAGNOSIS — M6281 Muscle weakness (generalized): Secondary | ICD-10-CM | POA: Diagnosis not present

## 2022-02-28 DIAGNOSIS — R262 Difficulty in walking, not elsewhere classified: Secondary | ICD-10-CM | POA: Diagnosis not present

## 2022-02-28 DIAGNOSIS — R600 Localized edema: Secondary | ICD-10-CM

## 2022-02-28 DIAGNOSIS — M25561 Pain in right knee: Secondary | ICD-10-CM

## 2022-02-28 NOTE — Therapy (Signed)
OUTPATIENT PHYSICAL THERAPY TREATMENT NOTE   Patient Name: Elaine Navarro MRN: [id number] DOB:May 08, 1946, 76 y.o., female Today's Date: 02/28/2022  PCP: Vernie Shanks, MD REFERRING PROVIDER: Mcarthur Rossetti, MD  END OF SESSION:   PT End of Session - 02/28/22 1147     Visit Number 8    Number of Visits 20    Date for PT Re-Evaluation 04/20/22    Authorization Type Medicare, KX at 15    Progress Note Due on Visit 10    PT Start Time 1139    PT Stop Time 1229    PT Time Calculation (min) 50 min    Activity Tolerance Patient tolerated treatment well    Behavior During Therapy [facility] for tasks assessed/performed                   Past Medical History:  Diagnosis Date   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   Hypertension    Left thyroid nodule 2003   S/P biopsy - benign   Lymphocytic colitis 03/2013   Migraines    Osteoporosis    PONV (postoperative nausea and vomiting)    Sciatica    Visceral injury    detached due to recent fall   Past Surgical History:  Procedure Laterality Date   ABDOMINOPLASTY  2010   [place] EXCISIONAL BIOPSY Bilateral 2001   x 3   CATARACT EXTRACTION, BILATERAL Bilateral 01/2020   Dental implants  1993   HEMORRHOID SURGERY  2000   KNEE ARTHROSCOPY W/ DEBRIDEMENT Right 07/2020   Dr. Berenice Primas   TOTAL KNEE ARTHROPLASTY Right 01/21/2022   Procedure: RIGHT TOTAL KNEE ARTHROPLASTY;  Surgeon: Mcarthur Rossetti, MD;  Location: [facility];  Service: Orthopedics;  Laterality: Right;  RFNA   VAGINAL HYSTERECTOMY  1974   Dysfunctional uterine bleeding.   Patient Active Problem List   Diagnosis Date Noted   Status post right knee replacement 01/21/2022   Arthritis of right knee 01/20/2022   Lung nodules 12/28/2021   Primary hypertension 05/13/2021   Thyroid nodule 05/13/2021   History of depression 05/13/2021    H/O: hysterectomy 05/13/2021   Postmenopausal 05/13/2021   Piriformis syndrome of left side 04/07/2021   Chronic pain of right knee 03/04/2021   H/O sciatica 11/04/2020   Age-related osteoporosis without current pathological fracture 11/04/2020   Vitamin D insufficiency 12/20/2019   Actinic keratosis 03/21/2017   Focal lymphocytic colitis 05/29/2013   Aneurysm artery, renal ([place]) 09/28/2012   Headache, chronic migraine without aura, intractable 03/02/2012    REFERRING DIAG:  G64.403 (ICD-10-CM) - Status post right knee replacement  THERAPY DIAG:  Chronic pain of right knee  Muscle weakness (generalized)  Difficulty in walking, not elsewhere classified  Stiffness of right knee, not elsewhere classified  Localized edema  Rationale for Evaluation and Treatment Rehabilitation  PERTINENT HISTORY: Osteoporosis, HTN  PRECAUTIONS: None  SUBJECTIVE: Pt indicated feeling complaints on Rt lateral ankle/foot that hurts more with walking morning, waking up in the morning.  Pt indicated stiffness in knee continued.   PAIN:  NPRS scale:  2/10 in Rt knee.   Pain location: Rt knee, Rt ankle Pain description: Ache, sore Aggravating factors: Prolonged walking Relieving factors: Ice, exercises, pain meds   OBJECTIVE:    PATIENT SURVEYS:  02/09/2022 FOTO intake  21    predicted 43  COGNITION: 02/09/2022  Overall cognitive status: Within functional limits for tasks assessed                              EDEMA:  02/09/2022  Localized edema noted Rt knee/leg   PALPATION: 02/09/2022 Tenderness noted anterior knee joint bilateral, incision region, posterior knee/distal thigh Rt   LOWER EXTREMITY ROM:   Active ROM Right Eval 02/09/22 Left Eval 02/09/22 Right 02/10/22 Right 02/15/22 Right 02/21/2022  Hip flexion         Hip extension         Hip abduction         Hip adduction         Hip internal rotation         Hip external rotation         Knee flexion AROM in supine heel slide   67    AROM supine 71 AROM supine 84 AROM Supine 95  Knee extension -10 in seated LAQ AROM   -6 in supine heel prop PROM   AROM supine -5  AROM supine quad set - 0 degrees  Ankle dorsiflexion         Ankle plantarflexion         Ankle inversion         Ankle eversion          (Blank rows = not tested)   LOWER EXTREMITY MMT:   MMT Right Eval 02/09/22 Left Eval 02/09/22 Right 02/28/2022 Left 02/28/2022  Hip flexion 4/5 5/5    Hip extension        Hip abduction        Hip adduction        Hip internal rotation        Hip external rotation        Knee flexion 4/5 5/5    Knee extension 3+/5 5/5 5/5 37.9, 32.5 lbs 5/5 39.8, 33 lbs  Ankle dorsiflexion 5/5 5/5    Ankle plantarflexion        Ankle inversion        Ankle eversion         (Blank rows = not tested)   FUNCTIONAL TESTS:  02/09/2022  18 inch chair transfer: able to perform s UE assist but most WB on Lt leg Lt SLS:             15 seconds     Rt SLS: 3 seconds   GAIT: 02/21/2022:  Use of SPC in Lt UE while in clinic c supervision provided  02/09/2022  Distance walked: Household distances Assistive device utilized:  [employer] Comments: maintained mid range knee flexion throughout, reduced stance on Rt c limited hip extension/terminal stance phase.       TODAY'S TREATMENT: 02/25/2022: Therex:  UBE LE full range 6 mins lvl 2.5  Incline board gastroc bilateral 30 sec x 3  Machine LAQ double leg up, Rt leg eccentric lowering slowly full range (no pain noted) 5 lbs 2 x 10   TherActivity (to improve functional movements, stairs, transfers)  Step up/on/over 4 inch c one rail assist 2 x 10 bilateral   Single leg press x 15 bilateral c slow focus 37 lbs  Neuro Re-ed Feet together on foam anterior/posterior ankle strategy weight shift 2 mins SLS vector reaching light touch fwd/lateral/back x 10 each bilateral   Vasopneumatic  Rt knee 10 mins 34 deg medium compression  02/23/2022: Therex:  UBE UE/LE  full range 8 mins lvl  1.0  Incline board gastroc bilateral 30 sec x 3  Leg press double leg 56 lbs x 18 , Rt single leg 31 lbs 2 x 15  Supine AROM Rt heel slide/SLR combo x 15  Seated LAQ 4 lb c pause in extension and flexion end ranges 2 x 15     Neuro re-ed  Tandem stance 1 min x 1 bilateral c SBA  Tandem ambulation in // bars 10 ft x 8 fwd  Church pew anterior/posterior ankle strategy on floor 2 mins  Manual:  Supine Rt knee flexion c distraction/IR for knee mobility gains.  G3 IR/ER proximal tibia mobilizations  Vasopneumatic  Rt knee 10 mins 34 deg medium compression   HOME EXERCISE PROGRAM: Access Code: [email or web address] URL: [email or web address] Date: 02/15/2022 Prepared by: Scot Jun  Exercises - Supine Heel Slide with Strap (Mirrored)  - 2-3 x daily - 7 x weekly - 1 sets - 10 reps - 5 hold - Seated Long Arc Quad  - 3-5 x daily - 7 x weekly - 1-2 sets - 10 reps - 2 hold - Seated Quad Set  - 3-5 x daily - 7 x weekly - 1 sets - 10 reps - 5 hold - Seated Passive Knee Extension with Weight  - 5 x daily - 7 x weekly - 1 sets - 1 reps - to tolerance hold - Seated Knee Flexion AAROM  - 3-5 x daily - 7 x weekly - 1 sets - 1 reps - 3 minutes hold - [place] Pew  - 3-5 x daily - 7 x weekly - 1 sets - 1-2 mins hold   ASSESSMENT:   CLINICAL IMPRESSION: Continued plan in clinic today to address strength and functional movement pattern improvements.  Education given on cross friction incision massage techniques for tenderness.     OBJECTIVE IMPAIRMENTS Abnormal gait, decreased activity tolerance, decreased balance, decreased coordination, decreased endurance, decreased mobility, difficulty walking, decreased ROM, decreased strength, hypomobility, increased edema, impaired perceived functional ability, impaired flexibility, improper body mechanics, and pain.    ACTIVITY LIMITATIONS carrying, lifting, bending, sitting, standing, squatting, sleeping, stairs, transfers, bed mobility, and locomotion  level   PARTICIPATION LIMITATIONS: cleaning, laundry, driving, shopping, community activity, and yard work   PERSONAL FACTORS  Osteoporosis, HTN  are also affecting patient's functional outcome.    REHAB POTENTIAL: Good   CLINICAL DECISION MAKING: Stable/uncomplicated   EVALUATION COMPLEXITY: Low     GOALS: Goals reviewed with patient? Yes   Short term PT Goals (target date for Short term goals are 3 weeks 03/02/2022) Patient will demonstrate independent use of home exercise program to maintain progress from in clinic treatments. Goal status: MET - assessed 02/23/2022   Long term PT goals (target dates for all long term goals are 10 weeks  04/20/2022 )   1. Patient will demonstrate/report pain at worst less than or equal to 2/10 to facilitate minimal limitation in daily activity secondary to pain symptoms. Goal status: on going - assessed 02/25/2022   2. Patient will demonstrate independent use of home exercise program to facilitate ability to maintain/progress functional gains from skilled physical therapy services. Goal status:  on going - assessed 02/25/2022   3. Patient will demonstrate FOTO outcome > or = 43 % to indicate reduced disability due to condition. Goal status:  on going - assessed 02/25/2022   4.  Patient will demonstrate Rt  knee AROM 0-110 degrees to facilitate ability to perform transfers, sitting, ambulation, stair navigation  s restriction due to mobility. Goal status:  on going - assessed 02/25/2022   5.  Patient will demonstrate Rt LE MMT 5/5 throughout to facilitate ability to perform usual standing, walking, stairs at PLOF s limitation due to symptoms.   Goal status:  on going - assessed 02/25/2022   6.  Patient will demonstrate independent ambulation community distances > 300 ft to facilitate community integration at [facility].   Goal status:  on going - assessed 02/25/2022   7.  Patient will demonstrate ascending/descending stairs c reciprocal gait pattern without  handrail assist for house entry.  a.  Goal Status:  on going - assessed 02/25/2022     PLAN: PT FREQUENCY: 2x-3x /week (3x week for first 1-2 weeks)   PT DURATION: 12 weeks   PLANNED INTERVENTIONS: Therapeutic exercises, Therapeutic activity, Neuro Muscular re-education, Balance training, Gait training, Patient/Family education, Joint mobilization, Stair training, DME instructions, Dry Needling, Electrical stimulation, Cryotherapy, Moist heat, Taping, Ultrasound, Ionotophoresis 44m/ml Dexamethasone, and Manual therapy.  All included unless contraindicated   PLAN FOR NEXT SESSION:  Continued strengthening/balance improvements, end range mobility gains.  MD visit note.    MScot Jun PT, DPT, OCS, ATC 02/28/22  12:20 PM

## 2022-03-02 ENCOUNTER — Encounter: Payer: Self-pay | Admitting: Physical Therapy

## 2022-03-02 ENCOUNTER — Ambulatory Visit (INDEPENDENT_AMBULATORY_CARE_PROVIDER_SITE_OTHER): Payer: Medicare Other | Admitting: Physical Therapy

## 2022-03-02 DIAGNOSIS — R600 Localized edema: Secondary | ICD-10-CM | POA: Diagnosis not present

## 2022-03-02 DIAGNOSIS — R262 Difficulty in walking, not elsewhere classified: Secondary | ICD-10-CM

## 2022-03-02 DIAGNOSIS — M6281 Muscle weakness (generalized): Secondary | ICD-10-CM | POA: Diagnosis not present

## 2022-03-02 DIAGNOSIS — G8929 Other chronic pain: Secondary | ICD-10-CM | POA: Diagnosis not present

## 2022-03-02 DIAGNOSIS — M25561 Pain in right knee: Secondary | ICD-10-CM | POA: Diagnosis not present

## 2022-03-02 DIAGNOSIS — M25661 Stiffness of right knee, not elsewhere classified: Secondary | ICD-10-CM

## 2022-03-02 NOTE — Therapy (Signed)
OUTPATIENT PHYSICAL THERAPY TREATMENT NOTE & Progress Note   Patient Name: Elaine Navarro MRN: [id number] DOB:20-Nov-1945, 76 y.o., female Today's Date: 03/02/2022  PCP: Vernie Shanks, MD REFERRING PROVIDER: Mcarthur Rossetti, MD  Progress Note Reporting Period 02/09/2022 to 03/02/2022  See note below for Objective Data and Assessment of Progress/Goals.      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   Hypertension    Left thyroid nodule 2003   S/P biopsy - benign   Lymphocytic colitis 03/2013   Migraines    Osteoporosis    PONV (postoperative nausea and vomiting)    Sciatica    Visceral injury    detached due to recent fall   Past Surgical History:  Procedure Laterality Date   ABDOMINOPLASTY  2010   [place] EXCISIONAL BIOPSY Bilateral 2001   x 3   CATARACT EXTRACTION, BILATERAL Bilateral 01/2020   Dental implants  1993   HEMORRHOID SURGERY  2000   KNEE ARTHROSCOPY W/ DEBRIDEMENT Right 07/2020   Dr. Berenice Primas   TOTAL KNEE ARTHROPLASTY Right 01/21/2022   Procedure: RIGHT TOTAL KNEE ARTHROPLASTY;  Surgeon: Mcarthur Rossetti, MD;  Location: [facility];  Service: Orthopedics;  Laterality: Right;  RFNA   VAGINAL HYSTERECTOMY  1974   Dysfunctional uterine bleeding.   Patient Active Problem List   Diagnosis Date Noted   Status post right knee replacement 01/21/2022   Arthritis  of right knee 01/20/2022   Lung nodules 12/28/2021   Primary hypertension 05/13/2021   Thyroid nodule 05/13/2021   History of depression 05/13/2021   H/O: hysterectomy 05/13/2021   Postmenopausal 05/13/2021   Piriformis syndrome of left side 04/07/2021   Chronic pain of right knee 03/04/2021   H/O sciatica 11/04/2020   Age-related osteoporosis without current pathological fracture 11/04/2020   Vitamin D insufficiency 12/20/2019   Actinic keratosis 03/21/2017   Focal lymphocytic colitis 05/29/2013   Aneurysm artery, renal ([place]) 09/28/2012   Headache, chronic migraine without aura, intractable 03/02/2012    REFERRING DIAG:  X93.716 (ICD-10-CM) - Status post right knee replacement  THERAPY DIAG:  Chronic pain of right knee  Muscle weakness (generalized)  Difficulty in walking, not elsewhere classified  Stiffness of right knee, not elsewhere classified  Localized edema  Rationale for Evaluation and Treatment Rehabilitation  PERTINENT HISTORY: Osteoporosis, HTN  PRECAUTIONS: None  SUBJECTIVE: She sees MD tomorrow. She has membership at [employer] on [address].    PAIN:  NPRS scale: today 5-6/10 and  over last week ranges 2/10 to 8-9/10 in Rt knee.   Pain location: Rt knee, Rt ankle Pain description: Ache, sore Aggravating factors: Prolonged walking Relieving factors: Ice,  exercises, pain meds   OBJECTIVE:    PATIENT SURVEYS:  02/09/2022 FOTO intake  21    predicted 43    COGNITION: 02/09/2022  Overall cognitive status: Within functional limits for tasks assessed                              EDEMA:  02/09/2022  Localized edema noted Rt knee/leg   PALPATION: 02/09/2022 Tenderness noted anterior knee joint bilateral, incision region, posterior knee/distal thigh Rt   LOWER EXTREMITY ROM:   Active ROM Right Eval 02/09/22 Left Eval 02/09/22 Right 02/10/22 Right 02/15/22 Right 02/21/22 Right 03/02/22  Hip flexion          Hip extension          Hip abduction           Hip adduction          Hip internal rotation          Hip external rotation          Knee flexion AROM in supine heel slide  67    AROM supine 71 AROM supine 84 AROM Supine 95 Seated P: 106* A: 98*  Knee extension -10 in seated LAQ AROM   -6 in supine heel prop PROM   AROM supine -5  AROM supine quad set - 0 degrees Seated P: -2* A: -6*  Ankle dorsiflexion          Ankle plantarflexion          Ankle inversion          Ankle eversion           (Blank rows = not tested)   LOWER EXTREMITY MMT:   MMT Right Eval 02/09/22 Left Eval 02/09/22 Right 02/28/2022 Left 02/28/2022  Hip flexion 4/5 5/5    Hip extension        Hip abduction        Hip adduction        Hip internal rotation        Hip external rotation        Knee flexion 4/5 5/5    Knee extension 3+/5 5/5 5/5 37.9, 32.5 lbs 5/5 39.8, 33 lbs  Ankle dorsiflexion 5/5 5/5    Ankle plantarflexion        Ankle inversion        Ankle eversion         (Blank rows = not tested)   FUNCTIONAL TESTS:  02/09/2022  18 inch chair transfer: able to perform s UE assist but most WB on Lt leg Lt SLS:             15 seconds     Rt SLS: 3 seconds   GAIT: 02/21/2022:  Use of SPC in Lt UE while in clinic c supervision provided  02/09/2022  Distance walked: Household distances Assistive device utilized:  [employer] Comments: maintained mid range knee flexion throughout, reduced stance on Rt c limited hip extension/terminal stance phase.       TODAY'S TREATMENT: 03/02/2022 Therex: Recumbent bike SciFit seat 8 LEs only full range 6 mins lvl 2.5  Incline board gastroc bilateral 30 sec x 3  Heel raises BLEs on incline board 10 reps 2 sets  Machine LAQ double leg up, Rt leg eccentric lowering slowly full range (no pain noted) 5 lbs 2 x 10   Machine knee flexion BLEs 20# 10 reps 2 sets  TherActivity (to improve  functional movements, stairs, transfers)  Stairs with right rail ascend - left rail descend and cane contralateral UE: PT demo &  verbal cues on technique.  Descend 11 steps LLE eccentric step-to pattern 1 rep & alternating pattern 2nd rep.  Ascend alternating 11 steps 2 reps. Pt's husband observed. Both verbalized understanding for home.  Pt ambulating in clinic without device safely.  She is cautious but no noted significant decrease in RLE stance time. Her right knee if flexed in stance.   Pt able to ballroom dance basic steps with husband with RLE step all directions.  No noted Rt knee instability. Pt reports that she could feel rt knee working but no increase in pain.   Neuro Re-ed   02/25/2022: Therex:  UBE LE full range 6 mins lvl 2.5  Incline board gastroc bilateral 30 sec x 3  Machine LAQ double leg up, Rt leg eccentric lowering slowly full range (no pain noted) 5 lbs 2 x 10   TherActivity (to improve functional movements, stairs, transfers)  Step up/on/over 4 inch c one rail assist 2 x 10 bilateral   Single leg press x 15 bilateral c slow focus 37 lbs  Neuro Re-ed Feet together on foam anterior/posterior ankle strategy weight shift 2 mins SLS vector reaching light touch fwd/lateral/back x 10 each bilateral   Vasopneumatic  Rt knee 10 mins 34 deg medium compression  02/23/2022: Therex:  UBE UE/LE full range 8 mins lvl 1.0  Incline board gastroc bilateral 30 sec x 3  Leg press double leg 56 lbs x 18 , Rt single leg 31 lbs 2 x 15  Supine AROM Rt heel slide/SLR combo x 15  Seated LAQ 4 lb c pause in extension and flexion end ranges 2 x 15     Neuro re-ed  Tandem stance 1 min x 1 bilateral c SBA  Tandem ambulation in // bars 10 ft x 8 fwd  [employer] pew anterior/posterior ankle strategy on floor 2 mins  Manual:  Supine Rt knee flexion c distraction/IR for knee mobility gains.  G3 IR/ER proximal tibia mobilizations  Vasopneumatic  Rt knee 10 mins 34 deg medium compression   HOME EXERCISE PROGRAM: Access Code: [email or web address] URL: [email or web address] Date: 02/15/2022 Prepared by: Scot Jun  Exercises - Supine Heel Slide with Strap (Mirrored)  - 2-3 x daily - 7 x weekly - 1 sets - 10 reps - 5 hold - Seated Long Arc Quad  - 3-5 x daily - 7 x weekly - 1-2 sets - 10 reps - 2 hold - Seated Quad Set  - 3-5 x daily - 7 x weekly - 1 sets - 10 reps - 5 hold - Seated Passive Knee Extension with Weight  - 5 x daily - 7 x weekly - 1 sets - 1 reps - to tolerance hold - Seated Knee Flexion AAROM  - 3-5 x daily - 7 x weekly - 1 sets - 1 reps - 3 minutes hold - [employer] Pew  - 3-5 x daily - 7 x weekly - 1 sets - 1-2 mins hold   ASSESSMENT:   CLINICAL IMPRESSION: Pt. has attended 9 visits overall during course of treatment.  See objective data for updated information.  Pt. has made good gains to this point c mild defiicts noted in Rt knee AROM and movement coordination.  Pt. may continue to benefit from skilled PT services to continue progression towards reaching established goals and reduced difficulty due to presentation. Pt improved knee function on stairs  with PT instruction.    OBJECTIVE IMPAIRMENTS Abnormal gait, decreased activity tolerance, decreased balance, decreased coordination, decreased endurance, decreased mobility, difficulty walking, decreased ROM, decreased strength, hypomobility, increased edema, impaired perceived functional ability, impaired flexibility, improper body mechanics, and pain.    ACTIVITY LIMITATIONS carrying, lifting, bending, sitting, standing, squatting, sleeping, stairs, transfers, bed mobility, and locomotion level   PARTICIPATION LIMITATIONS: cleaning, laundry, driving, shopping, community activity, and yard work   PERSONAL FACTORS  Osteoporosis, HTN  are also affecting patient's functional outcome.    REHAB POTENTIAL: Good   CLINICAL DECISION MAKING: Stable/uncomplicated   EVALUATION COMPLEXITY: Low     GOALS: Goals reviewed with patient? Yes   Short term PT Goals (target date for Short term goals are 3 weeks 03/02/2022) Patient will  demonstrate independent use of home exercise program to maintain progress from in clinic treatments. Goal status: MET - assessed 02/23/2022   Long term PT goals (target dates for all long term goals are 10 weeks  04/20/2022 )   1. Patient will demonstrate/report pain at worst less than or equal to 2/10 to facilitate minimal limitation in daily activity secondary to pain symptoms. Goal status: on going - assessed 02/25/2022   2. Patient will demonstrate independent use of home exercise program to facilitate ability to maintain/progress functional gains from skilled physical therapy services. Goal status:  on going - assessed 02/25/2022   3. Patient will demonstrate FOTO outcome > or = 43 % to indicate reduced disability due to condition. Goal status:  on going - assessed 02/25/2022   4.  Patient will demonstrate Rt  knee AROM 0-110 degrees to facilitate ability to perform transfers, sitting, ambulation, stair navigation s restriction due to mobility. Goal status:  on going - assessed 02/25/2022   5.  Patient will demonstrate Rt LE MMT 5/5 throughout to facilitate ability to perform usual standing, walking, stairs at PLOF s limitation due to symptoms.   Goal status:  on going - assessed 02/25/2022   6.  Patient will demonstrate independent ambulation community distances > 300 ft to facilitate community integration at [facility].   Goal status:  on going - assessed 02/25/2022   7.  Patient will demonstrate ascending/descending stairs c reciprocal gait pattern without handrail assist for house entry.  a.  Goal Status:  on going - assessed 02/25/2022     PLAN: PT FREQUENCY: 2x-3x /week (3x week for first 1-2 weeks)   PT DURATION: 12 weeks   PLANNED INTERVENTIONS: Therapeutic exercises, Therapeutic activity, Neuro Muscular re-education, Balance training, Gait training, Patient/Family education, Joint mobilization, Stair training, DME instructions, Dry Needling, Electrical stimulation, Cryotherapy, Moist  heat, Taping, Ultrasound, Ionotophoresis 44m/ml Dexamethasone, and Manual therapy.  All included unless contraindicated   PLAN FOR NEXT SESSION:   Continue range/strengthening/balance improvements, end range mobility gains. Check on stairs & ballroom dancing.   RJamey Reas PT, DPT 03/02/22  12:39 PM

## 2022-03-03 ENCOUNTER — Telehealth: Payer: Self-pay | Admitting: *Deleted

## 2022-03-03 ENCOUNTER — Ambulatory Visit (INDEPENDENT_AMBULATORY_CARE_PROVIDER_SITE_OTHER): Payer: Medicare Other | Admitting: Orthopaedic Surgery

## 2022-03-03 ENCOUNTER — Encounter: Payer: Self-pay | Admitting: Orthopaedic Surgery

## 2022-03-03 DIAGNOSIS — Z96651 Presence of right artificial knee joint: Secondary | ICD-10-CM

## 2022-03-03 NOTE — Progress Notes (Signed)
The patient is now around 6 weeks status post a right total knee arthroplasty.  After speaking with her and reviewing the notes from physical therapy she is trying hard to push herself to get her motion back.  On my exam today her extension is almost full and I can flex her to about 95 degrees and just beyond that.  This is an improvement over the last month.  She has appropriate questions as it relates to knee pain as well as right ankle pain.  She needs to wait at least 3 months postop before teeth cleaning and does not require antibiotics from my standpoint for teeth cleanings routinely.  She should continue outpatient physical therapy and this needs to be extended beyond early July in order to maximize her range of motion.  She understands the swelling will take some time to go away.  She can get back to her own gym in between physical therapy sessions and can submerge underwater.  I will see her back in 4 weeks to see how she is doing overall but no x-rays are needed.

## 2022-03-03 NOTE — Telephone Encounter (Signed)
Ortho bundle 30 day meeting and survey completed.

## 2022-03-04 ENCOUNTER — Encounter: Payer: Self-pay | Admitting: Rehabilitative and Restorative Service Providers"

## 2022-03-04 ENCOUNTER — Ambulatory Visit (INDEPENDENT_AMBULATORY_CARE_PROVIDER_SITE_OTHER): Payer: Medicare Other | Admitting: Rehabilitative and Restorative Service Providers"

## 2022-03-04 DIAGNOSIS — R262 Difficulty in walking, not elsewhere classified: Secondary | ICD-10-CM

## 2022-03-04 DIAGNOSIS — M25561 Pain in right knee: Secondary | ICD-10-CM | POA: Diagnosis not present

## 2022-03-04 DIAGNOSIS — R600 Localized edema: Secondary | ICD-10-CM | POA: Diagnosis not present

## 2022-03-04 DIAGNOSIS — G8929 Other chronic pain: Secondary | ICD-10-CM

## 2022-03-04 DIAGNOSIS — M25661 Stiffness of right knee, not elsewhere classified: Secondary | ICD-10-CM | POA: Diagnosis not present

## 2022-03-04 DIAGNOSIS — M6281 Muscle weakness (generalized): Secondary | ICD-10-CM

## 2022-03-07 ENCOUNTER — Telehealth: Payer: Self-pay | Admitting: *Deleted

## 2022-03-07 ENCOUNTER — Ambulatory Visit (INDEPENDENT_AMBULATORY_CARE_PROVIDER_SITE_OTHER): Payer: Medicare Other | Admitting: Rehabilitative and Restorative Service Providers"

## 2022-03-07 ENCOUNTER — Encounter: Payer: Self-pay | Admitting: Rehabilitative and Restorative Service Providers"

## 2022-03-07 DIAGNOSIS — G8929 Other chronic pain: Secondary | ICD-10-CM | POA: Diagnosis not present

## 2022-03-07 DIAGNOSIS — M25661 Stiffness of right knee, not elsewhere classified: Secondary | ICD-10-CM | POA: Diagnosis not present

## 2022-03-07 DIAGNOSIS — R600 Localized edema: Secondary | ICD-10-CM

## 2022-03-07 DIAGNOSIS — R262 Difficulty in walking, not elsewhere classified: Secondary | ICD-10-CM | POA: Diagnosis not present

## 2022-03-07 DIAGNOSIS — M6281 Muscle weakness (generalized): Secondary | ICD-10-CM | POA: Diagnosis not present

## 2022-03-07 DIAGNOSIS — M71552 Other bursitis, not elsewhere classified, left hip: Secondary | ICD-10-CM | POA: Diagnosis not present

## 2022-03-07 DIAGNOSIS — G894 Chronic pain syndrome: Secondary | ICD-10-CM | POA: Diagnosis not present

## 2022-03-07 DIAGNOSIS — M461 Sacroiliitis, not elsewhere classified: Secondary | ICD-10-CM | POA: Diagnosis not present

## 2022-03-07 DIAGNOSIS — M25561 Pain in right knee: Secondary | ICD-10-CM | POA: Diagnosis not present

## 2022-03-07 NOTE — Telephone Encounter (Signed)
Saw patient today outside of therapy and she asked me again about dental prophylaxis. She has a dental appointment in late August or September. Her 3 months would be mid-August. She states she has had a lot of dental work in the past (multiple implants) and it was related to infections she stated, and is concerned about not taking antibiotics. I told her again your thoughts on this, but would ask again if she was concerned. Recommendations?

## 2022-03-09 ENCOUNTER — Ambulatory Visit (INDEPENDENT_AMBULATORY_CARE_PROVIDER_SITE_OTHER): Payer: Medicare Other | Admitting: Rehabilitative and Restorative Service Providers"

## 2022-03-09 ENCOUNTER — Encounter: Payer: Self-pay | Admitting: Rehabilitative and Restorative Service Providers"

## 2022-03-09 DIAGNOSIS — M25561 Pain in right knee: Secondary | ICD-10-CM

## 2022-03-09 DIAGNOSIS — G8929 Other chronic pain: Secondary | ICD-10-CM | POA: Diagnosis not present

## 2022-03-09 DIAGNOSIS — R262 Difficulty in walking, not elsewhere classified: Secondary | ICD-10-CM | POA: Diagnosis not present

## 2022-03-09 DIAGNOSIS — M25661 Stiffness of right knee, not elsewhere classified: Secondary | ICD-10-CM

## 2022-03-09 DIAGNOSIS — R600 Localized edema: Secondary | ICD-10-CM | POA: Diagnosis not present

## 2022-03-09 DIAGNOSIS — M6281 Muscle weakness (generalized): Secondary | ICD-10-CM | POA: Diagnosis not present

## 2022-03-09 NOTE — Therapy (Signed)
OUTPATIENT PHYSICAL THERAPY TREATMENT NOTE  Patient Name: Elaine Navarro MRN: [id number] DOB:[birth year], 76 y.o., female Today's Date: 03/09/2022  PCP: Vernie Shanks, MD REFERRING PROVIDER: Mcarthur Rossetti, MD    END OF SESSION:   PT End of Session - 03/09/22 1146     Visit Number 12    Number of Visits 20    Date for PT Re-Evaluation 04/20/22    Authorization Type Medicare, KX at 15    Progress Note Due on Visit 31    PT Start Time 1141    PT Stop Time 1230    PT Time Calculation (min) 49 min    Activity Tolerance Patient tolerated treatment well    Behavior During Therapy [facility] for tasks assessed/performed                      Past Medical History:  Diagnosis Date   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   Hypertension    Left thyroid nodule 2003   S/P biopsy - benign   Lymphocytic colitis 03/2013   Migraines    Osteoporosis    PONV (postoperative nausea and vomiting)    Sciatica    Visceral injury    detached due to recent fall   Past Surgical History:  Procedure Laterality Date   ABDOMINOPLASTY  2010   [place] EXCISIONAL BIOPSY Bilateral 2001   x 3   CATARACT EXTRACTION, BILATERAL Bilateral 01/2020   Dental implants  1993   HEMORRHOID SURGERY  2000   KNEE ARTHROSCOPY W/ DEBRIDEMENT Right 07/2020   Dr. Berenice Primas   TOTAL KNEE ARTHROPLASTY Right 01/21/2022   Procedure: RIGHT TOTAL KNEE ARTHROPLASTY;  Surgeon: Mcarthur Rossetti, MD;  Location: [facility];  Service: Orthopedics;  Laterality: Right;  RFNA   VAGINAL HYSTERECTOMY  1974   Dysfunctional uterine bleeding.   Patient Active Problem List   Diagnosis Date Noted   Status post right knee replacement 01/21/2022   Arthritis of right knee 01/20/2022   Lung nodules 12/28/2021   Primary hypertension 05/13/2021   Thyroid nodule 05/13/2021   History of depression  05/13/2021   H/O: hysterectomy 05/13/2021   Postmenopausal 05/13/2021   Piriformis syndrome of left side 04/07/2021   Chronic pain of right knee 03/04/2021   H/O sciatica 11/04/2020   Age-related osteoporosis without current pathological fracture 11/04/2020   Vitamin D insufficiency 12/20/2019   Actinic keratosis 03/21/2017   Focal lymphocytic colitis 05/29/2013   Aneurysm artery, renal ([place]) 09/28/2012   Headache, chronic migraine without aura, intractable 03/02/2012    REFERRING DIAG:  I71.245 (ICD-10-CM) - Status post right knee replacement  THERAPY DIAG:  Chronic pain of right knee  Muscle weakness (generalized)  Difficulty in walking, not elsewhere classified  Stiffness of right knee, not elsewhere classified  Localized edema  Rationale for Evaluation and Treatment Rehabilitation  PERTINENT HISTORY: Osteoporosis, HTN  PRECAUTIONS: None  SUBJECTIVE:  Pt indicated no complaints today from Friday.  Pt indicated stiffness in knee continued.   PAIN:  NPRS scale: 0/10 pain upon arrival.    Pain location: Rt knee Pain description:  stiffness Aggravating factors: Prolonged walking, inactivity Relieving factors: Ice, exercises, pain meds   OBJECTIVE:    PATIENT SURVEYS:  03/07/2022:  FOTO update 55  02/09/2022 FOTO intake  21    predicted 43    COGNITION:  02/09/2022  Overall cognitive status: Within functional limits for tasks assessed                              EDEMA:  02/09/2022  Localized edema noted Rt knee/leg   PALPATION: 02/09/2022 Tenderness noted anterior knee joint bilateral, incision region, posterior knee/distal thigh Rt   LOWER EXTREMITY ROM:   Active ROM Right Eval 02/09/22 Left Eval 02/09/22 Right 02/10/22 Right 02/15/22 Right 02/21/22 Right 03/02/22 Right 03/09/2022  Hip flexion           Hip extension           Hip abduction           Hip adduction           Hip internal rotation           Hip external rotation           Knee flexion AROM  in supine heel slide  67    AROM supine 71 AROM supine 84 AROM Supine 95 Seated P: 106* A: 98* AROM supine heel slide: 114   Knee extension -10 in seated LAQ AROM   -6 in supine heel prop PROM   AROM supine -5  AROM supine quad set - 0 degrees Seated P: -2* A: -6*   Ankle dorsiflexion           Ankle plantarflexion           Ankle inversion           Ankle eversion            (Blank rows = not tested)   LOWER EXTREMITY MMT:   MMT Right Eval 02/09/22 Left Eval 02/09/22 Right 02/28/2022 Left 02/28/2022  Hip flexion 4/5 5/5    Hip extension        Hip abduction        Hip adduction        Hip internal rotation        Hip external rotation        Knee flexion 4/5 5/5    Knee extension 3+/5 5/5 5/5 37.9, 32.5 lbs 5/5 39.8, 33 lbs  Ankle dorsiflexion 5/5 5/5    Ankle plantarflexion        Ankle inversion        Ankle eversion         (Blank rows = not tested)   FUNCTIONAL TESTS:  02/09/2022  18 inch chair transfer: able to perform s UE assist but most WB on Lt leg Lt SLS:             15 seconds     Rt SLS: 3 seconds   GAIT: 02/21/2022:  Use of SPC in Lt UE while in clinic c supervision provided  02/09/2022  Distance walked: Household distances Assistive device utilized:  [employer] Comments: maintained mid range knee flexion throughout, reduced stance on Rt c limited hip extension/terminal stance phase.       TODAY'S TREATMENT: 03/09/2022: Therex:  Recumbent bike Full revolutions lvl 1 seat 4 8 mins  Seated Rt knee flexion c overpressure Lt knee 10 sec x 6 Supine heel slide Rt AROM x 10 /quad set combo 5 sec hold   Neuro Re-ed   Stepping over 4 inch step for flexion clearing c retro step weight shift x 20 bilateral in // bars  SLS c vector reaching fwd/lateral/ back x 8 each way bilateral on foam  in // bars  Theractivity  Leg press double leg (back flat) 75 lbs x 15, Rt single leg 31 lbs 2 x 15 (back flat)  Flight of stairs( 22 up, 22 down) c reciprocal pattern and  single rail use ( Lt on way down, Rt on way up ) to mimic house flight of stairs  Lateral step down WB Rt leg x 15 6 inch  Vasopneumatic  Rt knee 10 mins 34 deg medium compression  03/07/2022: Therex:  Recumbent bike Full revolutions lvl 1 seat 3 8 mins  Machine LAQ double leg up, Rt leg lowering eccentrically 5 lbs 3 x 10   Runner stretch on incline board Rt leg posterior 30 sec x 3 Rt leg single leg press  x 15 31 lbs (back flat) Supine heel slide Rt AROM x 10   Neuro Re-ed   Feet together on foam 30 seconds steady, 30 seconds eyes closed, 30 seconds c Lt and Rt head turns in neutral, 30 seconds c Lt and Rt head turns in extension cervical  Feet together toe/heel raises on foam 20x  SLS c vector reaching fwd/lateral/ back x 10 each way bilateral   Vasopneumatic  Rt knee 10 mins 34 deg medium compression  03/04/2022: Therex:  Recumbent bike Full revolutions lvl 1 seat 4 8 mins  Machine LAQ double leg up, Rt leg lowering eccentrically 5 lbs 3 x 10   Seated Rt knee flexion c Lt leg overpressure 15 sec x 6  Seated SLR slow lowering Rt 2 x 10   TherActivity  Rt leg single leg press 2 x 15 31 lbs  Step on over and down WB on Rt 6 inch step single hand rail assist x 16  Neuro Re-ed   Lateral stepping 3 cones x 8 bilateral  Vasopneumatic  Rt knee 10 mins 34 deg medium compression, Ice pack to Rt elbow the same 10 mins   HOME EXERCISE PROGRAM: Access Code: [email or web address] URL: [email or web address] Date: 02/15/2022 Prepared by: Scot Jun  Exercises - Supine Heel Slide with Strap (Mirrored)  - 2-3 x daily - 7 x weekly - 1 sets - 10 reps - 5 hold - Seated Long Arc Quad  - 3-5 x daily - 7 x weekly - 1-2 sets - 10 reps - 2 hold - Seated Quad Set  - 3-5 x daily - 7 x weekly - 1 sets - 10 reps - 5 hold - Seated Passive Knee Extension with Weight  - 5 x daily - 7 x weekly - 1 sets - 1 reps - to tolerance hold - Seated Knee Flexion AAROM  - 3-5 x daily - 7 x weekly - 1  sets - 1 reps - 3 minutes hold - [place]  - 3-5 x daily - 7 x weekly - 1 sets - 1-2 mins hold   ASSESSMENT:   CLINICAL IMPRESSION: Pt has demonstrated continued improvement in mobility as measured today.  At times, Pt demonstrated circumduction to clear foot despite ability to bend to accommodate movement.   Compliant surface balance to continue to be focus in balance to promote improved stability.    Pt appropriate for 2x/week at this time.     OBJECTIVE IMPAIRMENTS Abnormal gait, decreased activity tolerance, decreased balance, decreased coordination, decreased endurance, decreased mobility, difficulty walking, decreased ROM, decreased strength, hypomobility, increased edema, impaired perceived functional ability, impaired flexibility, improper body mechanics, and pain.    ACTIVITY LIMITATIONS carrying, lifting, bending, sitting, standing, squatting, sleeping, stairs, transfers, bed mobility,  and locomotion level   PARTICIPATION LIMITATIONS: cleaning, laundry, driving, shopping, community activity, and yard work   PERSONAL FACTORS  Osteoporosis, HTN  are also affecting patient's functional outcome.    REHAB POTENTIAL: Good   CLINICAL DECISION MAKING: Stable/uncomplicated   EVALUATION COMPLEXITY: Low     GOALS: Goals reviewed with patient? Yes   Short term PT Goals (target date for Short term goals are 3 weeks 03/02/2022) Patient will demonstrate independent use of home exercise program to maintain progress from in clinic treatments. Goal status: MET - assessed 02/23/2022   Long term PT goals (target dates for all long term goals are 10 weeks  04/20/2022 )   1. Patient will demonstrate/report pain at worst less than or equal to 2/10 to facilitate minimal limitation in daily activity secondary to pain symptoms. Goal status: on going - assessed 02/25/2022   2. Patient will demonstrate independent use of home exercise program to facilitate ability to maintain/progress functional  gains from skilled physical therapy services. Goal status:  on going - assessed 02/25/2022   3. Patient will demonstrate FOTO outcome > or = 43 % to indicate reduced disability due to condition. Goal status:  on going - assessed 02/25/2022   4.  Patient will demonstrate Rt  knee AROM 0-110 degrees to facilitate ability to perform transfers, sitting, ambulation, stair navigation s restriction due to mobility. Goal status:  on going - assessed 02/25/2022   5.  Patient will demonstrate Rt LE MMT 5/5 throughout to facilitate ability to perform usual standing, walking, stairs at PLOF s limitation due to symptoms.   Goal status:  on going - assessed 02/25/2022   6.  Patient will demonstrate independent ambulation community distances > 300 ft to facilitate community integration at [facility].   Goal status:  on going - assessed 02/25/2022   7.  Patient will demonstrate ascending/descending stairs c reciprocal gait pattern without handrail assist for house entry.  a.  Goal Status:  on going - assessed 02/25/2022     PLAN: PT FREQUENCY: 2x-3x /week (3x week for first 1-2 weeks)   PT DURATION: 12 weeks   PLANNED INTERVENTIONS: Therapeutic exercises, Therapeutic activity, Neuro Muscular re-education, Balance training, Gait training, Patient/Family education, Joint mobilization, Stair training, DME instructions, Dry Needling, Electrical stimulation, Cryotherapy, Moist heat, Taping, Ultrasound, Ionotophoresis 69m/ml Dexamethasone, and Manual therapy.  All included unless contraindicated   PLAN FOR NEXT SESSION:  Compliant surface balance, progressive strength and mobility included.   MScot Jun PT, DPT, OCS, ATC 03/09/22  12:22 PM

## 2022-03-10 DIAGNOSIS — M810 Age-related osteoporosis without current pathological fracture: Secondary | ICD-10-CM | POA: Diagnosis not present

## 2022-03-10 DIAGNOSIS — I10 Essential (primary) hypertension: Secondary | ICD-10-CM | POA: Diagnosis not present

## 2022-03-10 DIAGNOSIS — L659 Nonscarring hair loss, unspecified: Secondary | ICD-10-CM | POA: Diagnosis not present

## 2022-03-10 DIAGNOSIS — K529 Noninfective gastroenteritis and colitis, unspecified: Secondary | ICD-10-CM | POA: Diagnosis not present

## 2022-03-10 DIAGNOSIS — M19041 Primary osteoarthritis, right hand: Secondary | ICD-10-CM | POA: Diagnosis not present

## 2022-03-10 DIAGNOSIS — I700 Atherosclerosis of aorta: Secondary | ICD-10-CM | POA: Diagnosis not present

## 2022-03-10 DIAGNOSIS — G43909 Migraine, unspecified, not intractable, without status migrainosus: Secondary | ICD-10-CM | POA: Diagnosis not present

## 2022-03-10 DIAGNOSIS — F419 Anxiety disorder, unspecified: Secondary | ICD-10-CM | POA: Diagnosis not present

## 2022-03-10 DIAGNOSIS — M19042 Primary osteoarthritis, left hand: Secondary | ICD-10-CM | POA: Diagnosis not present

## 2022-03-14 ENCOUNTER — Other Ambulatory Visit: Payer: Self-pay | Admitting: Internal Medicine

## 2022-03-14 ENCOUNTER — Encounter: Payer: Self-pay | Admitting: Physical Therapy

## 2022-03-14 ENCOUNTER — Ambulatory Visit (INDEPENDENT_AMBULATORY_CARE_PROVIDER_SITE_OTHER): Payer: Medicare Other | Admitting: Physical Therapy

## 2022-03-14 DIAGNOSIS — M25661 Stiffness of right knee, not elsewhere classified: Secondary | ICD-10-CM | POA: Diagnosis not present

## 2022-03-14 DIAGNOSIS — R600 Localized edema: Secondary | ICD-10-CM

## 2022-03-14 DIAGNOSIS — R262 Difficulty in walking, not elsewhere classified: Secondary | ICD-10-CM

## 2022-03-14 DIAGNOSIS — M6281 Muscle weakness (generalized): Secondary | ICD-10-CM | POA: Diagnosis not present

## 2022-03-14 DIAGNOSIS — G8929 Other chronic pain: Secondary | ICD-10-CM | POA: Diagnosis not present

## 2022-03-14 DIAGNOSIS — M25561 Pain in right knee: Secondary | ICD-10-CM

## 2022-03-14 DIAGNOSIS — Z1231 Encounter for screening mammogram for malignant neoplasm of breast: Secondary | ICD-10-CM

## 2022-03-14 NOTE — Therapy (Signed)
OUTPATIENT PHYSICAL THERAPY TREATMENT NOTE  Patient Name: Elaine Navarro MRN: [id number] DOB:07/21/1946, 76 y.o., female Today's Date: 03/14/2022  PCP: Francis P Wong, MD REFERRING PROVIDER: Christopher Y Blackman, MD    END OF SESSION:   PT End of Session - 03/14/22 1150     Visit Number 13    Number of Visits 20    Date for PT Re-Evaluation 04/20/22    Authorization Type Medicare, KX at 15    Progress Note Due on Visit 19    PT Start Time 1145    PT Stop Time 1240    PT Time Calculation (min) 55 min    Activity Tolerance Patient tolerated treatment well    Behavior During Therapy WFL for tasks assessed/performed                       Past Medical History:  Diagnosis Date   Aneurysm of renal artery ([facility])    followed at [facility], stable with CT/angiogram 10/2021   Fracture of right wrist 2011   Fell in bathroom.   Hypertension    Left thyroid nodule 2003   S/P biopsy - benign   Lymphocytic colitis 03/2013   Migraines    Osteoporosis    PONV (postoperative nausea and vomiting)    Sciatica    Visceral injury    detached due to recent fall   Past Surgical History:  Procedure Laterality Date   ABDOMINOPLASTY  2010   APPENDECTOMY  1962   [place] EXCISIONAL BIOPSY Bilateral 2001   x 3   CATARACT EXTRACTION, BILATERAL Bilateral 01/2020   Dental implants  1993   HEMORRHOID SURGERY  2000   KNEE ARTHROSCOPY W/ DEBRIDEMENT Right 07/2020   Dr. Graves   TOTAL KNEE ARTHROPLASTY Right 01/21/2022   Procedure: RIGHT TOTAL KNEE ARTHROPLASTY;  Surgeon: Blackman, Christopher Y, MD;  Location: [facility];  Service: Orthopedics;  Laterality: Right;  RFNA   VAGINAL HYSTERECTOMY  1974   Dysfunctional uterine bleeding.   Patient Active Problem List   Diagnosis Date Noted   Status post right knee replacement 01/21/2022   Arthritis of right knee 01/20/2022   Lung nodules 12/28/2021   Primary hypertension 05/13/2021   Thyroid nodule 05/13/2021   History of depression  05/13/2021   H/O: hysterectomy 05/13/2021   Postmenopausal 05/13/2021   Piriformis syndrome of left side 04/07/2021   Chronic pain of right knee 03/04/2021   H/O sciatica 11/04/2020   Age-related osteoporosis without current pathological fracture 11/04/2020   Vitamin D insufficiency 12/20/2019   Actinic keratosis 03/21/2017   Focal lymphocytic colitis 05/29/2013   Aneurysm artery, renal ([facility]) 09/28/2012   Headache, chronic migraine without aura, intractable 03/02/2012    REFERRING DIAG:  Z96.651 (ICD-10-CM) - Status post right knee replacement  THERAPY DIAG:  Chronic pain of right knee  Muscle weakness (generalized)  Difficulty in walking, not elsewhere classified  Stiffness of right knee, not elsewhere classified  Localized edema  Rationale for Evaluation and Treatment Rehabilitation  PERTINENT HISTORY: Osteoporosis, HTN  PRECAUTIONS: None  SUBJECTIVE:  She did her knee exercises.    PAIN:  NPRS scale:  0/10 pain upon arrival.  Last night hurt at distal medial knee 5-6/10  Pain location: Rt knee Pain description:  stiffness Aggravating factors: Prolonged walking, inactivity Relieving factors: Ice, exercises, pain meds   OBJECTIVE:    PATIENT SURVEYS:  03/07/2022:  FOTO update 55  02/09/2022 FOTO intake  21    predicted 43      COGNITION: 02/09/2022  Overall cognitive status: Within functional limits for tasks assessed                              EDEMA:  02/09/2022  Localized edema noted Rt knee/leg   PALPATION: 02/09/2022 Tenderness noted anterior knee joint bilateral, incision region, posterior knee/distal thigh Rt   LOWER EXTREMITY ROM:   Active ROM Right Eval 02/09/22 Left Eval 02/09/22 Right 02/10/22 Right 02/15/22 Right 02/21/22 Right 03/02/22 Right 03/09/2022  Hip flexion           Hip extension           Hip abduction           Hip adduction           Hip internal rotation           Hip external rotation           Knee flexion AROM in supine heel  slide  67    AROM supine 71 AROM supine 84 AROM Supine 95 Seated P: 106* A: 98* AROM supine heel slide: 114   Knee extension -10 in seated LAQ AROM   -6 in supine heel prop PROM   AROM supine -5  AROM supine quad set - 0 degrees Seated P: -2* A: -6*   Ankle dorsiflexion           Ankle plantarflexion           Ankle inversion           Ankle eversion            (Blank rows = not tested)   LOWER EXTREMITY MMT:   MMT Right Eval 02/09/22 Left Eval 02/09/22 Right 02/28/2022 Left 02/28/2022  Hip flexion 4/5 5/5    Hip extension        Hip abduction        Hip adduction        Hip internal rotation        Hip external rotation        Knee flexion 4/5 5/5    Knee extension 3+/5 5/5 5/5 37.9, 32.5 lbs 5/5 39.8, 33 lbs  Ankle dorsiflexion 5/5 5/5    Ankle plantarflexion        Ankle inversion        Ankle eversion         (Blank rows = not tested)   FUNCTIONAL TESTS:  02/09/2022  18 inch chair transfer: able to perform s UE assist but most WB on Lt leg Lt SLS:             15 seconds     Rt SLS: 3 seconds   GAIT: 02/21/2022:  Use of SPC in Lt UE while in clinic c supervision provided  02/09/2022  Distance walked: Household distances Assistive device utilized:  [employer] Comments: maintained mid range knee flexion throughout, reduced stance on Rt c limited hip extension/terminal stance phase.       TODAY'S TREATMENT: 03/14/2022 Therex:  Recumbent bike Full revolutions lvl 2 seat 4 8 mins  Neuro Re-ed   SLS flex & ext sliding contralateral LE to cones (ant-lat, lat & post-lat) 5 reps ea LE.  [place] room dancing with her husband for 3 min basic rumba & waltz box step.  Her knee did not hurt.   Theractivity  Leg press double leg (back flat) 81 lbs x 15 with PT manual assist end range  ext, Rt single leg 43 lbs 15 reps back 45* & 15 reps back flat  Flight of stairs( 22 up, 22 down) c reciprocal pattern and single rail use ( Lt on way down, Rt on way up ) to mimic house flight of  stairs  Functional squat picking up cones 4 reps.  Step up, over & down BOSU round side up with light BUE support 10 reps  Lateral step up & down BOSU round side 10 reps light BUE support   Stepping over 6" bolster RLE swing clearance with flexion & RLE stance ext Terminal knee ext green theraband bilateral stance 10 reps progressed to terminal stance hold with LLE tapping cone 10 reps.  Vasopneumatic  Rt knee 10 mins 34 deg medium compression  03/09/2022: Therex:  Recumbent bike Full revolutions lvl 1 seat 4 8 mins  Seated Rt knee flexion c overpressure Lt knee 10 sec x 6 Supine heel slide Rt AROM x 10 /quad set combo 5 sec hold   Neuro Re-ed   Stepping over 4 inch step for flexion clearing c retro step weight shift x 20 bilateral in // bars  SLS c vector reaching fwd/lateral/ back x 8 each way bilateral on foam in // bars  Theractivity  Leg press double leg (back flat) 75 lbs x 15, Rt single leg 31 lbs 2 x 15 (back flat)  Flight of stairs( 22 up, 22 down) c reciprocal pattern and single rail use ( Lt on way down, Rt on way up ) to mimic house flight of stairs  Lateral step down WB Rt leg x 15 6 inch  Vasopneumatic  Rt knee 10 mins 34 deg medium compression  03/07/2022: Therex:  Recumbent bike Full revolutions lvl 1 seat 3 8 mins  Machine LAQ double leg up, Rt leg lowering eccentrically 5 lbs 3 x 10   Runner stretch on incline board Rt leg posterior 30 sec x 3 Rt leg single leg press  x 15 31 lbs (back flat) Supine heel slide Rt AROM x 10   Neuro Re-ed   Feet together on foam 30 seconds steady, 30 seconds eyes closed, 30 seconds c Lt and Rt head turns in neutral, 30 seconds c Lt and Rt head turns in extension cervical  Feet together toe/heel raises on foam 20x  SLS c vector reaching fwd/lateral/ back x 10 each way bilateral   Vasopneumatic  Rt knee 10 mins 34 deg medium compression    HOME EXERCISE PROGRAM: Access Code: [email or web address] URL:  [email or web address] Date: 02/15/2022 Prepared by: Michael Wright  Exercises - Supine Heel Slide with Strap (Mirrored)  - 2-3 x daily - 7 x weekly - 1 sets - 10 reps - 5 hold - Seated Long Arc Quad  - 3-5 x daily - 7 x weekly - 1-2 sets - 10 reps - 2 hold - Seated Quad Set  - 3-5 x daily - 7 x weekly - 1 sets - 10 reps - 5 hold - Seated Passive Knee Extension with Weight  - 5 x daily - 7 x weekly - 1 sets - 1 reps - to tolerance hold - Seated Knee Flexion AAROM  - 3-5 x daily - 7 x weekly - 1 sets - 1 reps - 3 minutes hold - [employer]  - 3-5 x daily - 7 x weekly - 1 sets - 1-2 mins hold   ASSESSMENT:   CLINICAL IMPRESSION: Patient improved knee flexion functionally including stairs, squats and dancing.  She still   struggles with full knee ext in stance.  Her pain has improved however she does report medial knee pain with movements. She continues to benefit from skilled PT.    OBJECTIVE IMPAIRMENTS Abnormal gait, decreased activity tolerance, decreased balance, decreased coordination, decreased endurance, decreased mobility, difficulty walking, decreased ROM, decreased strength, hypomobility, increased edema, impaired perceived functional ability, impaired flexibility, improper body mechanics, and pain.    ACTIVITY LIMITATIONS carrying, lifting, bending, sitting, standing, squatting, sleeping, stairs, transfers, bed mobility, and locomotion level   PARTICIPATION LIMITATIONS: cleaning, laundry, driving, shopping, community activity, and yard work   PERSONAL FACTORS  Osteoporosis, HTN  are also affecting patient's functional outcome.    REHAB POTENTIAL: Good   CLINICAL DECISION MAKING: Stable/uncomplicated   EVALUATION COMPLEXITY: Low     GOALS: Goals reviewed with patient? Yes   Short term PT Goals (target date for Short term goals are 3 weeks 03/02/2022) Patient will demonstrate independent use of home exercise program to maintain progress from in clinic  treatments. Goal status: MET - assessed 02/23/2022   Long term PT goals (target dates for all long term goals are 10 weeks  04/20/2022 )   1. Patient will demonstrate/report pain at worst less than or equal to 2/10 to facilitate minimal limitation in daily activity secondary to pain symptoms. Goal status: on going - assessed 02/25/2022   2. Patient will demonstrate independent use of home exercise program to facilitate ability to maintain/progress functional gains from skilled physical therapy services. Goal status:  on going - assessed 02/25/2022   3. Patient will demonstrate FOTO outcome > or = 43 % to indicate reduced disability due to condition. Goal status:  on going - assessed 02/25/2022   4.  Patient will demonstrate Rt  knee AROM 0-110 degrees to facilitate ability to perform transfers, sitting, ambulation, stair navigation s restriction due to mobility. Goal status:  on going - assessed 02/25/2022   5.  Patient will demonstrate Rt LE MMT 5/5 throughout to facilitate ability to perform usual standing, walking, stairs at PLOF s limitation due to symptoms.   Goal status:  on going - assessed 02/25/2022   6.  Patient will demonstrate independent ambulation community distances > 300 ft to facilitate community integration at PLOF.   Goal status:  on going - assessed 02/25/2022   7.  Patient will demonstrate ascending/descending stairs c reciprocal gait pattern without handrail assist for house entry.  a.  Goal Status:  on going - assessed 02/25/2022     PLAN: PT FREQUENCY: 2x-3x /week (3x week for first 1-2 weeks)   PT DURATION: 12 weeks   PLANNED INTERVENTIONS: Therapeutic exercises, Therapeutic activity, Neuro Muscular re-education, Balance training, Gait training, Patient/Family education, Joint mobilization, Stair training, DME instructions, Dry Needling, Electrical stimulation, Cryotherapy, Moist heat, Taping, Ultrasound, Ionotophoresis 4mg/ml Dexamethasone, and Manual therapy.  All  included unless contraindicated   PLAN FOR NEXT SESSION:   manual therapy for range both flex & ext,  try dance with her shoes,  progressive strength and mobility included.    , PT, DPT 03/14/22  12:55 PM             

## 2022-03-16 ENCOUNTER — Encounter: Payer: Self-pay | Admitting: Physical Therapy

## 2022-03-16 ENCOUNTER — Ambulatory Visit (INDEPENDENT_AMBULATORY_CARE_PROVIDER_SITE_OTHER): Payer: Medicare Other | Admitting: Physical Therapy

## 2022-03-16 DIAGNOSIS — M25561 Pain in right knee: Secondary | ICD-10-CM | POA: Diagnosis not present

## 2022-03-16 DIAGNOSIS — R600 Localized edema: Secondary | ICD-10-CM

## 2022-03-16 DIAGNOSIS — R262 Difficulty in walking, not elsewhere classified: Secondary | ICD-10-CM

## 2022-03-16 DIAGNOSIS — M25661 Stiffness of right knee, not elsewhere classified: Secondary | ICD-10-CM

## 2022-03-16 DIAGNOSIS — M6281 Muscle weakness (generalized): Secondary | ICD-10-CM | POA: Diagnosis not present

## 2022-03-16 DIAGNOSIS — G8929 Other chronic pain: Secondary | ICD-10-CM

## 2022-03-16 NOTE — Therapy (Signed)
OUTPATIENT PHYSICAL THERAPY TREATMENT NOTE  Patient Name: Elaine Navarro MRN: [id number] DOB:May 25, 1946, 76 y.o., female Today's Date: 03/16/2022  PCP: Vernie Shanks, MD REFERRING PROVIDER: Mcarthur Rossetti, MD    END OF SESSION:   PT End of Session - 03/16/22 1135     Visit Number 14    Number of Visits 20    Date for PT Re-Evaluation 04/20/22    Authorization Type Medicare, KX at 15    Progress Note Due on Visit 30    PT Start Time 1134    PT Stop Time 1231    PT Time Calculation (min) 57 min    Activity Tolerance Patient tolerated treatment well    Behavior During Therapy [facility] for tasks assessed/performed                        Past Medical History:  Diagnosis Date   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   Hypertension    Left thyroid nodule 2003   S/P biopsy - benign   Lymphocytic colitis 03/2013   Migraines    Osteoporosis    PONV (postoperative nausea and vomiting)    Sciatica    Visceral injury    detached due to recent fall   Past Surgical History:  Procedure Laterality Date   ABDOMINOPLASTY  2010   [place] EXCISIONAL BIOPSY Bilateral 2001   x 3   CATARACT EXTRACTION, BILATERAL Bilateral 01/2020   Dental implants  1993   HEMORRHOID SURGERY  2000   KNEE ARTHROSCOPY W/ DEBRIDEMENT Right 07/2020   Dr. Berenice Primas   TOTAL KNEE ARTHROPLASTY Right 01/21/2022   Procedure: RIGHT TOTAL KNEE ARTHROPLASTY;  Surgeon: Mcarthur Rossetti, MD;  Location: [facility];  Service: Orthopedics;  Laterality: Right;  RFNA   VAGINAL HYSTERECTOMY  1974   Dysfunctional uterine bleeding.   Patient Active Problem List   Diagnosis Date Noted   Status post right knee replacement 01/21/2022   Arthritis of right knee 01/20/2022   Lung nodules 12/28/2021   Primary hypertension 05/13/2021   Thyroid nodule 05/13/2021   History of depression  05/13/2021   H/O: hysterectomy 05/13/2021   Postmenopausal 05/13/2021   Piriformis syndrome of left side 04/07/2021   Chronic pain of right knee 03/04/2021   H/O sciatica 11/04/2020   Age-related osteoporosis without current pathological fracture 11/04/2020   Vitamin D insufficiency 12/20/2019   Actinic keratosis 03/21/2017   Focal lymphocytic colitis 05/29/2013   Aneurysm artery, renal ([place]) 09/28/2012   Headache, chronic migraine without aura, intractable 03/02/2012    REFERRING DIAG:  J81.191 (ICD-10-CM) - Status post right knee replacement  THERAPY DIAG:  Chronic pain of right knee  Muscle weakness (generalized)  Difficulty in walking, not elsewhere classified  Stiffness of right knee, not elsewhere classified  Localized edema  Rationale for Evaluation and Treatment Rehabilitation  PERTINENT HISTORY: Osteoporosis, HTN  PRECAUTIONS: None  SUBJECTIVE:  She has been doing her updated exercises and reported stiffness this morning that hasn't loosened up prior to appointment. Pain and stiffness wake her up in the middle of the night, and moving and heat will loosen knee and relieve pain. She descended the stairs following Monday's appointment with the cane and rail to leave the building.  PAIN:  NPRS scale: 0/10 pain upon arrival.  Pain highest during middle of night,  waking from sleep 4/10  Pain location: Rt knee Pain description:  stiffness Aggravating factors: Prolonged walking, inactivity Relieving factors: Ice, exercises, pain meds   OBJECTIVE:    PATIENT SURVEYS:  03/07/2022:  FOTO update 55  02/09/2022 FOTO intake  21    predicted 43    COGNITION: 02/09/2022  Overall cognitive status: Within functional limits for tasks assessed                              EDEMA:  02/09/2022  Localized edema noted Rt knee/leg   PALPATION: 02/09/2022 Tenderness noted anterior knee joint bilateral, incision region, posterior knee/distal thigh Rt   LOWER EXTREMITY ROM:    Active ROM Right Eval 02/09/22 Left Eval 02/09/22 Right 02/10/22 Right 02/15/22 Right 02/21/22 Right 03/02/22 Right 03/09/2022  Hip flexion           Hip extension           Hip abduction           Hip adduction           Hip internal rotation           Hip external rotation           Knee flexion AROM in supine heel slide  67    AROM supine 71 AROM supine 84 AROM Supine 95 Seated P: 106* A: 98* AROM supine heel slide: 114   Knee extension -10 in seated LAQ AROM   -6 in supine heel prop PROM   AROM supine -5  AROM supine quad set - 0 degrees Seated P: -2* A: -6*   Ankle dorsiflexion           Ankle plantarflexion           Ankle inversion           Ankle eversion            (Blank rows = not tested)   LOWER EXTREMITY MMT:   MMT Right Eval 02/09/22 Left Eval 02/09/22 Right 02/28/2022 Left 02/28/2022  Hip flexion 4/5 5/5    Hip extension        Hip abduction        Hip adduction        Hip internal rotation        Hip external rotation        Knee flexion 4/5 5/5    Knee extension 3+/5 5/5 5/5 37.9, 32.5 lbs 5/5 39.8, 33 lbs  Ankle dorsiflexion 5/5 5/5    Ankle plantarflexion        Ankle inversion        Ankle eversion         (Blank rows = not tested)   FUNCTIONAL TESTS:  02/09/2022  18 inch chair transfer: able to perform s UE assist but most WB on Lt leg Lt SLS:             15 seconds     Rt SLS: 3 seconds   GAIT: 02/21/2022:  Use of SPC in Lt UE while in clinic c supervision provided  02/09/2022  Distance walked: Household distances Assistive device utilized:  [employer] Comments: maintained mid range knee flexion throughout, reduced stance on Rt c limited hip extension/terminal stance phase.       TODAY'S TREATMENT: 03/16/2022 Therex:  Recumbent bike Full revolutions lvl 2 seat 3 8 mins  Theractivity:  Leg press back 45* double leg 81 lbs x  15, Rt single leg 43 lbs x 15 reps  Leg press back flat double leg 75 lbs x 15, Rt single leg 43 lbs x 15 reps,  verbal and tactile cues for leg extension and towel roll under coccyx Step up, over & down BOSU round side up with light BUE support 10 reps, 2 steps towards and away from BOSU  Lateral step up & down BOSU round side 15 reps light BUE support, some medial knee pain  Neuromuscular Reeducation Standing adduction stepping (medial anterior and medial posterior) x 3 light BUE support and x 5 no UE with no knee pain Grapevine step x 3 down and back through parallel bars with no UE support Walking in heeled dancing shoes 50 ft x 2, no noted pain or knee instability Dancing with husband and dancing shoes x 5 min, included step outs and 90-360* turns, no noted pain or knee instability Patient Education  PT, patient, and spouse discussed addition of grapevine step with visual demonstration of partner guarding technique, and verbalized understanding  PT, patient, and spouse discussed progression back to dancing beginning with 1:1 dance:rest ratio and hand-hold guarding during steps and turns. Progression will include independent dancing, single leg stance, more complex dance steps, and longer dance:rest periods.  Self-Care:  PT demonstrated on patient icing technique with styrofoam cup for 5-10 minutes as needed for pain and swelling. Patient and spouse verbalized understanding and recorded video of technique and instructions.  Vasopneumatic  Rt knee 10 mins 34 deg medium compression  03/14/2022 Therex:  Recumbent bike Full revolutions lvl 2 seat 4 8 mins  Neuro Re-ed   SLS flex & ext sliding contralateral LE to cones (ant-lat, lat & post-lat) 5 reps ea LE.  [place] room dancing with her husband for 3 min basic rumba & waltz box step.  Her knee did not hurt.   Theractivity  Leg press double leg (back flat) 81 lbs x 15 with PT manual assist end range ext, Rt single leg 43 lbs 15 reps back 45* & 15 reps back flat  Flight of stairs( 22 up, 22 down) c reciprocal pattern and single rail use ( Lt on way down,  Rt on way up ) to mimic house flight of stairs  Functional squat picking up cones 4 reps.  Step up, over & down BOSU round side up with light BUE support 10 reps  Lateral step up & down BOSU round side 10 reps light BUE support   Stepping over 6" bolster RLE swing clearance with flexion & RLE stance ext Terminal knee ext green theraband bilateral stance 10 reps progressed to terminal stance hold with LLE tapping cone 10 reps.  Vasopneumatic  Rt knee 10 mins 34 deg medium compression  03/09/2022: Therex:  Recumbent bike Full revolutions lvl 1 seat 4 8 mins  Seated Rt knee flexion c overpressure Lt knee 10 sec x 6 Supine heel slide Rt AROM x 10 /quad set combo 5 sec hold   Neuro Re-ed   Stepping over 4 inch step for flexion clearing c retro step weight shift x 20 bilateral in // bars  SLS c vector reaching fwd/lateral/ back x 8 each way bilateral on foam in // bars  Theractivity  Leg press double leg (back flat) 75 lbs x 15, Rt single leg 31 lbs 2 x 15 (back flat)  Flight of stairs( 22 up, 22 down) c reciprocal pattern and single rail use ( Lt on way down, Rt on way up ) to  mimic house flight of stairs  Lateral step down WB Rt leg x 15 6 inch  Vasopneumatic  Rt knee 10 mins 34 deg medium compression    HOME EXERCISE PROGRAM: Access Code: [email or web address] URL: [email or web address] Date: 02/15/2022 Prepared by: Scot Jun  Exercises - Supine Heel Slide with Strap (Mirrored)  - 2-3 x daily - 7 x weekly - 1 sets - 10 reps - 5 hold - Seated Long Arc Quad  - 3-5 x daily - 7 x weekly - 1-2 sets - 10 reps - 2 hold - Seated Quad Set  - 3-5 x daily - 7 x weekly - 1 sets - 10 reps - 5 hold - Seated Passive Knee Extension with Weight  - 5 x daily - 7 x weekly - 1 sets - 1 reps - to tolerance hold - Seated Knee Flexion AAROM  - 3-5 x daily - 7 x weekly - 1 sets - 1 reps - 3 minutes hold - [place]  - 3-5 x daily - 7 x weekly - 1 sets - 1-2 mins hold   ASSESSMENT:   CLINICAL  IMPRESSION:  Patient demonstrated progress with stairs and dancing activities. She feels pleased with progress thus far and she is excited to continue to progress dancing. She is on track to meet the LTGs, and remains limited by hands-free stair use and medial knee pain at night and with some exercises. She continues to benefit from skilled PT.   OBJECTIVE IMPAIRMENTS Abnormal gait, decreased activity tolerance, decreased balance, decreased coordination, decreased endurance, decreased mobility, difficulty walking, decreased ROM, decreased strength, hypomobility, increased edema, impaired perceived functional ability, impaired flexibility, improper body mechanics, and pain.    ACTIVITY LIMITATIONS carrying, lifting, bending, sitting, standing, squatting, sleeping, stairs, transfers, bed mobility, and locomotion level   PARTICIPATION LIMITATIONS: cleaning, laundry, driving, shopping, community activity, and yard work   PERSONAL FACTORS  Osteoporosis, HTN  are also affecting patient's functional outcome.    REHAB POTENTIAL: Good   CLINICAL DECISION MAKING: Stable/uncomplicated   EVALUATION COMPLEXITY: Low     GOALS: Goals reviewed with patient? Yes   Short term PT Goals (target date for Short term goals are 3 weeks 03/02/2022) Patient will demonstrate independent use of home exercise program to maintain progress from in clinic treatments. Goal status: MET - assessed 02/23/2022   Long term PT goals (target dates for all long term goals are 10 weeks  04/20/2022 )   1. Patient will demonstrate/report pain at worst less than or equal to 2/10 to facilitate minimal limitation in daily activity secondary to pain symptoms. Goal status: on going - assessed 02/25/2022   2. Patient will demonstrate independent use of home exercise program to facilitate ability to maintain/progress functional gains from skilled physical therapy services. Goal status:  on going - assessed 02/25/2022   3. Patient will  demonstrate FOTO outcome > or = 43 % to indicate reduced disability due to condition. Goal status:  on going - assessed 02/25/2022   4.  Patient will demonstrate Rt  knee AROM 0-110 degrees to facilitate ability to perform transfers, sitting, ambulation, stair navigation s restriction due to mobility. Goal status:  on going - assessed 02/25/2022   5.  Patient will demonstrate Rt LE MMT 5/5 throughout to facilitate ability to perform usual standing, walking, stairs at PLOF s limitation due to symptoms.   Goal status:  on going - assessed 02/25/2022   6.  Patient will demonstrate independent ambulation community distances > 300 ft to  facilitate community integration at [facility].   Goal status:  on going - assessed 02/25/2022   7.  Patient will demonstrate ascending/descending stairs c reciprocal gait pattern without handrail assist for house entry.  a.  Goal Status:  on going - assessed 02/25/2022     PLAN: PT FREQUENCY: 2x-3x /week (3x week for first 1-2 weeks)   PT DURATION: 12 weeks   PLANNED INTERVENTIONS: Therapeutic exercises, Therapeutic activity, Neuro Muscular re-education, Balance training, Gait training, Patient/Family education, Joint mobilization, Stair training, DME instructions, Dry Needling, Electrical stimulation, Cryotherapy, Moist heat, Taping, Ultrasound, Ionotophoresis 42m/ml Dexamethasone, and Manual therapy.  All included unless contraindicated   PLAN FOR NEXT SESSION:   Assess stairs to progress to hands-free use, check on dancing maneuvers and endurance, continue to progress strength with multidirectional closed chain activity, vaso prn  KJana Hakim SPT 03/16/22, 5:05 PM  This entire session of physical therapy was performed under the direct supervision of PT signing evaluation /treatment. PT reviewed note and agrees.   RJamey Reas PT, DPT 03/16/22  4:50 PM

## 2022-03-21 ENCOUNTER — Ambulatory Visit (HOSPITAL_BASED_OUTPATIENT_CLINIC_OR_DEPARTMENT_OTHER): Payer: Medicare Other

## 2022-03-21 ENCOUNTER — Ambulatory Visit
Admission: RE | Admit: 2022-03-21 | Discharge: 2022-03-21 | Disposition: A | Discharge status: Home / Self Care | Payer: Medicare Other | Source: Ambulatory Visit | Attending: Internal Medicine | Admitting: Internal Medicine

## 2022-03-21 DIAGNOSIS — Z1231 Encounter for screening mammogram for malignant neoplasm of breast: Secondary | ICD-10-CM | POA: Diagnosis not present

## 2022-03-21 DIAGNOSIS — G43719 Chronic migraine without aura, intractable, without status migrainosus: Secondary | ICD-10-CM | POA: Diagnosis not present

## 2022-03-22 ENCOUNTER — Ambulatory Visit (INDEPENDENT_AMBULATORY_CARE_PROVIDER_SITE_OTHER): Payer: Medicare Other | Admitting: Rehabilitative and Restorative Service Providers"

## 2022-03-22 ENCOUNTER — Encounter: Payer: Self-pay | Admitting: Rehabilitative and Restorative Service Providers"

## 2022-03-22 DIAGNOSIS — M6281 Muscle weakness (generalized): Secondary | ICD-10-CM

## 2022-03-22 DIAGNOSIS — R262 Difficulty in walking, not elsewhere classified: Secondary | ICD-10-CM

## 2022-03-22 DIAGNOSIS — M25561 Pain in right knee: Secondary | ICD-10-CM

## 2022-03-22 DIAGNOSIS — M25661 Stiffness of right knee, not elsewhere classified: Secondary | ICD-10-CM | POA: Diagnosis not present

## 2022-03-22 DIAGNOSIS — G8929 Other chronic pain: Secondary | ICD-10-CM

## 2022-03-22 DIAGNOSIS — R600 Localized edema: Secondary | ICD-10-CM | POA: Diagnosis not present

## 2022-03-22 NOTE — Therapy (Signed)
OUTPATIENT PHYSICAL THERAPY TREATMENT NOTE  Patient Name: Elaine Navarro MRN: [id number] DOB:12-24-1945, 76 y.o., female Today's Date: 03/22/2022  PCP: Vernie Shanks, MD REFERRING PROVIDER: Mcarthur Rossetti, MD    END OF SESSION:   PT End of Session - 03/22/22 1422     Visit Number 15    Number of Visits 20    Date for PT Re-Evaluation 04/20/22    Authorization Type Medicare, KX at 15    Progress Note Due on Visit 19    PT Start Time 1428    PT Stop Time 1518    PT Time Calculation (min) 50 min    Activity Tolerance Patient tolerated treatment well    Behavior During Therapy [facility] for tasks assessed/performed                         Past Medical History:  Diagnosis Date   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   Hypertension    Left thyroid nodule 2003   S/P biopsy - benign   Lymphocytic colitis 03/2013   Migraines    Osteoporosis    PONV (postoperative nausea and vomiting)    Sciatica    Visceral injury    detached due to recent fall   Past Surgical History:  Procedure Laterality Date   ABDOMINOPLASTY  2010   [place] EXCISIONAL BIOPSY Bilateral 2001   x 3   CATARACT EXTRACTION, BILATERAL Bilateral 01/2020   Dental implants  1993   HEMORRHOID SURGERY  2000   KNEE ARTHROSCOPY W/ DEBRIDEMENT Right 07/2020   Dr. Berenice Primas   TOTAL KNEE ARTHROPLASTY Right 01/21/2022   Procedure: RIGHT TOTAL KNEE ARTHROPLASTY;  Surgeon: Mcarthur Rossetti, MD;  Location: [facility];  Service: Orthopedics;  Laterality: Right;  RFNA   VAGINAL HYSTERECTOMY  1974   Dysfunctional uterine bleeding.   Patient Active Problem List   Diagnosis Date Noted   Status post right knee replacement 01/21/2022   Arthritis of right knee 01/20/2022   Lung nodules 12/28/2021   Primary hypertension 05/13/2021   Thyroid nodule 05/13/2021   History of depression  05/13/2021   H/O: hysterectomy 05/13/2021   Postmenopausal 05/13/2021   Piriformis syndrome of left side 04/07/2021   Chronic pain of right knee 03/04/2021   H/O sciatica 11/04/2020   Age-related osteoporosis without current pathological fracture 11/04/2020   Vitamin D insufficiency 12/20/2019   Actinic keratosis 03/21/2017   Focal lymphocytic colitis 05/29/2013   Aneurysm artery, renal ([place]) 09/28/2012   Headache, chronic migraine without aura, intractable 03/02/2012    REFERRING DIAG:  V67.209 (ICD-10-CM) - Status post right knee replacement  THERAPY DIAG:  Chronic pain of right knee  Muscle weakness (generalized)  Difficulty in walking, not elsewhere classified  Stiffness of right knee, not elsewhere classified  Localized edema  Rationale for Evaluation and Treatment Rehabilitation  PERTINENT HISTORY: Osteoporosis, HTN  PRECAUTIONS: None  SUBJECTIVE:  Pt indicated having spent time yesterday walking and standing that led to increased swelling/tightness and occasional pain complaints shooting in medial knee.  Happened yesterday.    PAIN:  NPRS scale: 0/10 upon arrival, 8/10 at worst yesterday Pain location: Rt knee Pain description:  stiffness, swelling pressure, insidious sharp Aggravating factors: Prolonged walking Relieving factors: Ice, exercises, pain meds   OBJECTIVE:    PATIENT SURVEYS:  03/07/2022:  FOTO update 55  02/09/2022 FOTO intake  21    predicted 43    COGNITION: 02/09/2022  Overall cognitive status: Within functional limits for tasks assessed                              EDEMA:  02/09/2022  Localized edema noted Rt knee/leg   PALPATION: 02/09/2022 Tenderness noted anterior knee joint bilateral, incision region, posterior knee/distal thigh Rt   LOWER EXTREMITY ROM:   Active ROM Right Eval 02/09/22 Left Eval 02/09/22 Right 02/10/22 Right 02/15/22 Right 02/21/22 Right 03/02/22 Right 03/09/2022  Hip flexion           Hip extension           Hip  abduction           Hip adduction           Hip internal rotation           Hip external rotation           Knee flexion AROM in supine heel slide  67    AROM supine 71 AROM supine 84 AROM Supine 95 Seated P: 106* A: 98* AROM supine heel slide: 114   Knee extension -10 in seated LAQ AROM   -6 in supine heel prop PROM   AROM supine -5  AROM supine quad set - 0 degrees Seated P: -2* A: -6*   Ankle dorsiflexion           Ankle plantarflexion           Ankle inversion           Ankle eversion            (Blank rows = not tested)   LOWER EXTREMITY MMT:   MMT Right Eval 02/09/22 Left Eval 02/09/22 Right 02/28/2022 Left 02/28/2022  Hip flexion 4/5 5/5    Hip extension        Hip abduction        Hip adduction        Hip internal rotation        Hip external rotation        Knee flexion 4/5 5/5    Knee extension 3+/5 5/5 5/5 37.9, 32.5 lbs 5/5 39.8, 33 lbs  Ankle dorsiflexion 5/5 5/5    Ankle plantarflexion        Ankle inversion        Ankle eversion         (Blank rows = not tested)   FUNCTIONAL TESTS:  02/09/2022  18 inch chair transfer: able to perform s UE assist but most WB on Lt leg Lt SLS:             15 seconds     Rt SLS: 3 seconds   GAIT: 03/22/2022: independent ambulation in clinic time.   02/21/2022:  Use of SPC in Lt UE while in clinic c supervision provided  02/09/2022  Distance walked: Household distances Assistive device utilized:  [employer] Comments: maintained mid range knee flexion throughout, reduced stance on Rt c limited hip extension/terminal stance phase.       TODAY'S TREATMENT: 03/22/2022: Therex:  UBE LE only lvl 1 10 mins for ROM  Seated LAQ 7 lbs 2 x 15 Rt,  attempted machine LAQ but stopped due to pain symptoms.   Supine LAQ c cues for home for symptom management x 10 in clinic  Seated Rt SLR 2 x  10 c quad set focus  Incline calf stretch runner stretch Rt leg posterior 30 sec x 5 Rt leg  TherActivity  Leg press Rt 43 lbs 2 x 15  Step  on over and down WB on Rt 6 inch 2 x 10 c light hand rail assist     03/16/2022 Therex:  Recumbent bike Full revolutions lvl 2 seat 3 8 mins  Theractivity:  Leg press back 45* double leg 81 lbs x 15, Rt single leg 43 lbs x 15 reps  Leg press back flat double leg 75 lbs x 15, Rt single leg 43 lbs x 15 reps, verbal and tactile cues for leg extension and towel roll under coccyx Step up, over & down BOSU round side up with light BUE support 10 reps, 2 steps towards and away from BOSU  Lateral step up & down BOSU round side 15 reps light BUE support, some medial knee pain  Neuromuscular Reeducation Standing adduction stepping (medial anterior and medial posterior) x 3 light BUE support and x 5 no UE with no knee pain Grapevine step x 3 down and back through parallel bars with no UE support Walking in heeled dancing shoes 50 ft x 2, no noted pain or knee instability Dancing with husband and dancing shoes x 5 min, included step outs and 90-360* turns, no noted pain or knee instability Patient Education  PT, patient, and spouse discussed addition of grapevine step with visual demonstration of partner guarding technique, and verbalized understanding  PT, patient, and spouse discussed progression back to dancing beginning with 1:1 dance:rest ratio and hand-hold guarding during steps and turns. Progression will include independent dancing, single leg stance, more complex dance steps, and longer dance:rest periods.  Self-Care:  PT demonstrated on patient icing technique with styrofoam cup for 5-10 minutes as needed for pain and swelling. Patient and spouse verbalized understanding and recorded video of technique and instructions.  Vasopneumatic  Rt knee 10 mins 34 deg medium compression  03/14/2022 Therex:  Recumbent bike Full revolutions lvl 2 seat 4 8 mins  Neuro Re-ed   SLS flex & ext sliding contralateral LE to cones (ant-lat, lat & post-lat) 5 reps ea LE.  [place] room dancing with her husband  for 3 min basic rumba & waltz box step.  Her knee did not hurt.   Theractivity  Leg press double leg (back flat) 81 lbs x 15 with PT manual assist end range ext, Rt single leg 43 lbs 15 reps back 45* & 15 reps back flat  Flight of stairs( 22 up, 22 down) c reciprocal pattern and single rail use ( Lt on way down, Rt on way up ) to mimic house flight of stairs  Functional squat picking up cones 4 reps.  Step up, over & down BOSU round side up with light BUE support 10 reps  Lateral step up & down BOSU round side 10 reps light BUE support   Stepping over 6" bolster RLE swing clearance with flexion & RLE stance ext Terminal knee ext green theraband bilateral stance 10 reps progressed to terminal stance hold with LLE tapping cone 10 reps.  Vasopneumatic  Rt knee 10 mins 34 deg medium compression    HOME EXERCISE PROGRAM: Access Code: [email or web address] URL: [email or web address] Date: 02/15/2022 Prepared by: Scot Jun  Exercises - Supine Heel Slide with Strap (Mirrored)  - 2-3 x daily - 7 x weekly - 1 sets - 10 reps - 5 hold - Seated Long Arc Quad  - 3-5  x daily - 7 x weekly - 1-2 sets - 10 reps - 2 hold - Seated Quad Set  - 3-5 x daily - 7 x weekly - 1 sets - 10 reps - 5 hold - Seated Passive Knee Extension with Weight  - 5 x daily - 7 x weekly - 1 sets - 1 reps - to tolerance hold - Seated Knee Flexion AAROM  - 3-5 x daily - 7 x weekly - 1 sets - 1 reps - 3 minutes hold - [place]  - 3-5 x daily - 7 x weekly - 1 sets - 1-2 mins hold   ASSESSMENT:   CLINICAL IMPRESSION: Extended time spent in visit talking through response to standing/walking activity.  Cues given for management strategies (icing, elevation, muscle pump activation, shift towards mobility only in those times in HEP).   Continued strengthening improvements to help improve functional activity tolerance.  Focused primarily on strength building today to improve functional WB.    OBJECTIVE IMPAIRMENTS Abnormal  gait, decreased activity tolerance, decreased balance, decreased coordination, decreased endurance, decreased mobility, difficulty walking, decreased ROM, decreased strength, hypomobility, increased edema, impaired perceived functional ability, impaired flexibility, improper body mechanics, and pain.    ACTIVITY LIMITATIONS carrying, lifting, bending, sitting, standing, squatting, sleeping, stairs, transfers, bed mobility, and locomotion level   PARTICIPATION LIMITATIONS: cleaning, laundry, driving, shopping, community activity, and yard work   PERSONAL FACTORS  Osteoporosis, HTN  are also affecting patient's functional outcome.    REHAB POTENTIAL: Good   CLINICAL DECISION MAKING: Stable/uncomplicated   EVALUATION COMPLEXITY: Low     GOALS: Goals reviewed with patient? Yes   Short term PT Goals (target date for Short term goals are 3 weeks 03/02/2022) Patient will demonstrate independent use of home exercise program to maintain progress from in clinic treatments. Goal status: MET - assessed 02/23/2022   Long term PT goals (target dates for all long term goals are 10 weeks  04/20/2022 )   1. Patient will demonstrate/report pain at worst less than or equal to 2/10 to facilitate minimal limitation in daily activity secondary to pain symptoms. Goal status: on going - assessed 02/25/2022   2. Patient will demonstrate independent use of home exercise program to facilitate ability to maintain/progress functional gains from skilled physical therapy services. Goal status:  on going - assessed 02/25/2022   3. Patient will demonstrate FOTO outcome > or = 43 % to indicate reduced disability due to condition. Goal status:  on going - assessed 02/25/2022   4.  Patient will demonstrate Rt  knee AROM 0-110 degrees to facilitate ability to perform transfers, sitting, ambulation, stair navigation s restriction due to mobility. Goal status:  on going - assessed 02/25/2022   5.  Patient will demonstrate Rt  LE MMT 5/5 throughout to facilitate ability to perform usual standing, walking, stairs at PLOF s limitation due to symptoms.   Goal status:  on going - assessed 02/25/2022   6.  Patient will demonstrate independent ambulation community distances > 300 ft to facilitate community integration at [facility].   Goal status:  on going - assessed 02/25/2022   7.  Patient will demonstrate ascending/descending stairs c reciprocal gait pattern without handrail assist for house entry.  a.  Goal Status:  on going - assessed 02/25/2022     PLAN: PT FREQUENCY: 2x-3x /week (3x week for first 1-2 weeks)   PT DURATION: 12 weeks   PLANNED INTERVENTIONS: Therapeutic exercises, Therapeutic activity, Neuro Muscular re-education, Balance training, Gait training, Patient/Family  education, Joint mobilization, Stair training, DME instructions, Dry Needling, Electrical stimulation, Cryotherapy, Moist heat, Taping, Ultrasound, Ionotophoresis 85m/ml Dexamethasone, and Manual therapy.  All included unless contraindicated   PLAN FOR NEXT SESSION:   LTG reassessment/FOTO reassessment . Progressive quad strengthening, dynamic balance control.    MScot Jun PT, DPT, OCS, ATC 03/22/22  3:05 PM

## 2022-03-23 DIAGNOSIS — H04123 Dry eye syndrome of bilateral lacrimal glands: Secondary | ICD-10-CM | POA: Diagnosis not present

## 2022-03-25 ENCOUNTER — Encounter: Payer: Self-pay | Admitting: Rehabilitative and Restorative Service Providers"

## 2022-03-25 ENCOUNTER — Ambulatory Visit (INDEPENDENT_AMBULATORY_CARE_PROVIDER_SITE_OTHER): Payer: Medicare Other | Admitting: Rehabilitative and Restorative Service Providers"

## 2022-03-25 DIAGNOSIS — G8929 Other chronic pain: Secondary | ICD-10-CM | POA: Diagnosis not present

## 2022-03-25 DIAGNOSIS — M6281 Muscle weakness (generalized): Secondary | ICD-10-CM

## 2022-03-25 DIAGNOSIS — M25561 Pain in right knee: Secondary | ICD-10-CM | POA: Diagnosis not present

## 2022-03-25 DIAGNOSIS — M25661 Stiffness of right knee, not elsewhere classified: Secondary | ICD-10-CM | POA: Diagnosis not present

## 2022-03-25 DIAGNOSIS — R600 Localized edema: Secondary | ICD-10-CM | POA: Diagnosis not present

## 2022-03-25 DIAGNOSIS — R262 Difficulty in walking, not elsewhere classified: Secondary | ICD-10-CM

## 2022-03-25 NOTE — Therapy (Addendum)
OUTPATIENT PHYSICAL THERAPY TREATMENT NOTE /PROGRESS NOTE Patient Name: Elaine Navarro MRN: [id number] DOB:11-21-1945, 76 y.o., female Today's Date: 03/25/2022  PCP: Ileana Ladd, MD REFERRING PROVIDER: Kathryne Hitch, MD  Progress Note Reporting Period 03/02/2022 to 03/25/2022  See note below for Objective Data and Assessment of Progress/Goals.    END OF SESSION:   PT End of Session - 03/25/22 1150     Visit Number 16    Number of Visits 20    Date for PT Re-Evaluation 04/20/22    Authorization Type Medicare, KX at 15    Progress Note Due on Visit 26    PT Start Time 1143    PT Stop Time 1233    PT Time Calculation (min) 50 min    Activity Tolerance Patient tolerated treatment well    Behavior During Therapy [facility] for tasks assessed/performed               Past Medical History:  Diagnosis Date   Aneurysm of renal artery (HCC)    followed at [facility], stable with CT/angiogram 10/2021   Fracture of right wrist 2011   Fell in bathroom.   Hypertension    Left thyroid nodule 2003   S/P biopsy - benign   Lymphocytic colitis 03/2013   Migraines    Osteoporosis    PONV (postoperative nausea and vomiting)    Sciatica    Visceral injury    detached due to recent fall   Past Surgical History:  Procedure Laterality Date   ABDOMINOPLASTY  2010   APPENDECTOMY  1962   BREAST EXCISIONAL BIOPSY Bilateral 2001   x 3   CATARACT EXTRACTION, BILATERAL Bilateral 01/2020   Dental implants  1993   HEMORRHOID SURGERY  2000   KNEE ARTHROSCOPY W/ DEBRIDEMENT Right 07/2020   Dr. Luiz Blare   TOTAL KNEE ARTHROPLASTY Right 01/21/2022   Procedure: RIGHT TOTAL KNEE ARTHROPLASTY;  Surgeon: Kathryne Hitch, MD;  Location: [facility];  Service: Orthopedics;  Laterality: Right;  RFNA   VAGINAL HYSTERECTOMY  1974   Dysfunctional uterine bleeding.   Patient Active Problem List   Diagnosis Date Noted   Status post right knee replacement 01/21/2022   Arthritis of right knee  01/20/2022   Lung nodules 12/28/2021   Primary hypertension 05/13/2021   Thyroid nodule 05/13/2021   History of depression 05/13/2021   H/O: hysterectomy 05/13/2021   Postmenopausal 05/13/2021   Piriformis syndrome of left side 04/07/2021   Chronic pain of right knee 03/04/2021   H/O sciatica 11/04/2020   Age-related osteoporosis without current pathological fracture 11/04/2020   Vitamin D insufficiency 12/20/2019   Actinic keratosis 03/21/2017   Focal lymphocytic colitis 05/29/2013   Aneurysm artery, renal (HCC) 09/28/2012   Headache, chronic migraine without aura, intractable 03/02/2012    REFERRING DIAG:  Z96.651 (ICD-10-CM) - Status post right knee replacement  THERAPY DIAG:  Chronic pain of right knee  Muscle weakness (generalized)  Difficulty in walking, not elsewhere classified  Stiffness of right knee, not elsewhere classified  Localized edema  Rationale for Evaluation and Treatment Rehabilitation  PERTINENT HISTORY: Osteoporosis, HTN  PRECAUTIONS: None  SUBJECTIVE:  Pt indicated pain at worst in last week 7/10.  Pt indicated those symptoms happened typically will happen at night time.  Pt indicated global rating of change + 7 a very great deal better.   Difficulty reported still with going down stairs, tightness c prolonged sitting.   PAIN:  NPRS scale: 0/10 upon arrival, 7/10 at nighttime Pain location:  Rt knee Pain description:  stiffness, swelling pressure, insidious sharp Aggravating factors: Prolonged walking, nighttime pain Relieving factors: medicine, heat   OBJECTIVE:    PATIENT SURVEYS:    03/07/2022:  FOTO update 55  02/09/2022 FOTO intake  21    predicted 43    COGNITION: 02/09/2022  Overall cognitive status: Within functional limits for tasks assessed                              EDEMA:  02/09/2022  Localized edema noted Rt knee/leg   PALPATION: 02/09/2022 Tenderness noted anterior knee joint bilateral, incision region, posterior  knee/distal thigh Rt   LOWER EXTREMITY ROM:   Active ROM Right Eval 02/09/22 Left Eval 02/09/22 Right 02/10/22 Right 02/15/22 Right 02/21/22 Right 03/02/22 Right 03/09/2022 Right 03/25/2022  Hip flexion            Hip extension            Hip abduction            Hip adduction            Hip internal rotation            Hip external rotation            Knee flexion AROM in supine heel slide  67    AROM supine 71 AROM supine 84 AROM Supine 95 Seated P: 106* A: 98* AROM supine heel slide: 114  AROM supine heel slide 125  Knee extension -10 in seated LAQ AROM   -6 in supine heel prop PROM   AROM supine -5  AROM supine quad set - 0 degrees Seated P: -2* A: -6*  0 deg seated LAQ  Ankle dorsiflexion            Ankle plantarflexion            Ankle inversion            Ankle eversion             (Blank rows = not tested)   LOWER EXTREMITY MMT:   MMT Right Eval 02/09/22 Left Eval 02/09/22 Right 02/28/2022 Left 02/28/2022 Right 03/25/2022  Hip flexion 4/5 5/5     Hip extension         Hip abduction         Hip adduction         Hip internal rotation         Hip external rotation         Knee flexion 4/5 5/5     Knee extension 3+/5 5/5 5/5 37.9, 32.5 lbs 5/5 39.8, 33 lbs 5/5 38, 36 lbs  Ankle dorsiflexion 5/5 5/5     Ankle plantarflexion         Ankle inversion         Ankle eversion          (Blank rows = not tested)   FUNCTIONAL TESTS:  03/25/2022:   Rt SLS:  30 seconds  02/09/2022  18 inch chair transfer: able to perform s UE assist but most WB on Lt leg Lt SLS:             15 seconds     Rt SLS: 3 seconds   GAIT: 03/22/2022: independent ambulation in clinic time.   02/21/2022:  Use of SPC in Lt UE while in clinic c supervision provided  02/09/2022  Distance walked: [employer] device  utilized:  FWW Comments: maintained mid range knee flexion throughout, reduced stance on Rt c limited hip extension/terminal stance phase.       TODAY'S  TREATMENT: 03/25/2022:   Therex:  UBE LE only lvl 4.0 9 mins  Seated isometric extension hold 5 sec x 3 Lt  Supine heel slide Rt x 2   Review of HEP for use at night and to interrupt sitting time to reduced stiffness complaints.   TherActivity  Leg press Rt 2 x 15 56 lbs, additional time spent for slow controlled movement focus  Lateral step down WB on Rt 2 x 10 6 inch step light one hand touch on bar   [employer] fwd/back 30x each c SBA  SLS on foam c anterior, anterior/lateral, lateral cone touching c contralateral leg x 10 Rt leg balance c SBA  Vasopneumatic 10 mins medium compression Rt knee in elevation, 34 degrees  03/22/2022: Therex:  UBE LE only lvl 1 10 mins for ROM  Seated LAQ 7 lbs 2 x 15 Rt,  attempted machine LAQ but stopped due to pain symptoms.   Supine LAQ c cues for home for symptom management x 10 in clinic    TherActivity  Leg press Rt 43 lbs 2 x 15  Step on over and down WB on Rt 6 inch 2 x 10 c light hand rail assist   03/16/2022 Therex:  Recumbent bike Full revolutions lvl 2 seat 3 8 mins  Theractivity:  Leg press back 45* double leg 81 lbs x 15, Rt single leg 43 lbs x 15 reps  Leg press back flat double leg 75 lbs x 15, Rt single leg 43 lbs x 15 reps, verbal and tactile cues for leg extension and towel roll under coccyx Step up, over & down BOSU round side up with light BUE support 10 reps, 2 steps towards and away from BOSU  Lateral step up & down BOSU round side 15 reps light BUE support, some medial knee pain  Neuromuscular Reeducation Standing adduction stepping (medial anterior and medial posterior) x 3 light BUE support and x 5 no UE with no knee pain Grapevine step x 3 down and back through parallel bars with no UE support Walking in heeled dancing shoes 50 ft x 2, no noted pain or knee instability Dancing with husband and dancing shoes x 5 min, included step outs and 90-360* turns, no noted pain or knee instability Patient  Education  PT, patient, and spouse discussed addition of grapevine step with visual demonstration of partner guarding technique, and verbalized understanding  PT, patient, and spouse discussed progression back to dancing beginning with 1:1 dance:rest ratio and hand-hold guarding during steps and turns. Progression will include independent dancing, single leg stance, more complex dance steps, and longer dance:rest periods.  Self-Care:  PT demonstrated on patient icing technique with styrofoam cup for 5-10 minutes as needed for pain and swelling. Patient and spouse verbalized understanding and recorded video of technique and instructions.  Vasopneumatic  Rt knee 10 mins 34 deg medium compression    HOME EXERCISE PROGRAM: Access Code: [email or web address] URL: [email or web address] Date: 02/15/2022 Prepared by: Chyrel Masson  Exercises - Supine Heel Slide with Strap (Mirrored)  - 2-3 x daily - 7 x weekly - 1 sets - 10 reps - 5 hold - Seated Long Arc Quad  - 3-5 x daily - 7 x weekly - 1-2 sets - 10 reps - 2 hold - Seated Quad Set  - 3-5  x daily - 7 x weekly - 1 sets - 10 reps - 5 hold - Seated Passive Knee Extension with Weight  - 5 x daily - 7 x weekly - 1 sets - 1 reps - to tolerance hold - Seated Knee Flexion AAROM  - 3-5 x daily - 7 x weekly - 1 sets - 1 reps - 3 minutes hold - [place] [employer]  - 3-5 x daily - 7 x weekly - 1 sets - 1-2 mins hold   ASSESSMENT:   CLINICAL IMPRESSION: Pt has attended 16 visits overall during course of treatment, reporting GROC +7 at this time.  See objective data for updated information.  Good gains noted in all areas to this point.  Recommendation for continued skilled PT services to improve progressive dynamic balance control and strength for functional tasks and gains for return to dance activity.    OBJECTIVE IMPAIRMENTS Abnormal gait, decreased activity tolerance, decreased balance, decreased coordination, decreased endurance, decreased mobility,  difficulty walking, decreased ROM, decreased strength, hypomobility, increased edema, impaired perceived functional ability, impaired flexibility, improper body mechanics, and pain.    ACTIVITY LIMITATIONS carrying, lifting, bending, sitting, standing, squatting, sleeping, stairs, transfers, bed mobility, and locomotion level   PARTICIPATION LIMITATIONS: cleaning, laundry, driving, shopping, community activity, and yard work   PERSONAL FACTORS  Osteoporosis, HTN  are also affecting patient's functional outcome.    REHAB POTENTIAL: Good   CLINICAL DECISION MAKING: Stable/uncomplicated   EVALUATION COMPLEXITY: Low     GOALS: Goals reviewed with patient? Yes   Short term PT Goals (target date for Short term goals are 3 weeks 03/02/2022) Patient will demonstrate independent use of home exercise program to maintain progress from in clinic treatments. Goal status: MET - assessed 02/23/2022   Long term PT goals (target dates for all long term goals are 10 weeks  04/20/2022 )   1. Patient will demonstrate/report pain at worst less than or equal to 2/10 to facilitate minimal limitation in daily activity secondary to pain symptoms. Goal status: on going - assessed 03/25/2022   2. Patient will demonstrate independent use of home exercise program to facilitate ability to maintain/progress functional gains from skilled physical therapy services. Goal status:  on going - assessed 03/25/2022   3. Patient will demonstrate FOTO outcome > or = 43 % to indicate reduced disability due to condition. Goal status:  MET - assessed 03/25/2022   4.  Patient will demonstrate Rt  knee AROM 0-110 degrees to facilitate ability to perform transfers, sitting, ambulation, stair navigation s restriction due to mobility. Goal status:  MET - assessed 03/25/2022   5.  Patient will demonstrate Rt LE MMT 5/5 throughout to facilitate ability to perform usual standing, walking, stairs at PLOF s limitation due to symptoms.    Goal status:  MET - assessed 03/25/2022   6.  Patient will demonstrate independent ambulation community distances > 300 ft to facilitate community integration at [facility].   Goal status: Met - assessed 03/25/2022   7.  Patient will demonstrate ascending/descending stairs c reciprocal gait pattern without handrail assist for house entry.  a.  Goal Status: on going  - assessed 03/25/2022     PLAN: PT FREQUENCY: 2x-3x /week (3x week for first 1-2 weeks)   PT DURATION: 12 weeks   PLANNED INTERVENTIONS: Therapeutic exercises, Therapeutic activity, Neuro Muscular re-education, Balance training, Gait training, Patient/Family education, Joint mobilization, Stair training, DME instructions, Dry Needling, Electrical stimulation, Cryotherapy, Moist heat, Taping, Ultrasound, Ionotophoresis 4mg /ml Dexamethasone, and Manual therapy.  All included unless contraindicated   PLAN FOR NEXT SESSION:   Dynamic and compliant surface balance improvements, quad strengthening.   Chyrel Masson, PT, DPT, OCS, ATC 03/25/22  12:57 PM

## 2022-03-29 ENCOUNTER — Ambulatory Visit (INDEPENDENT_AMBULATORY_CARE_PROVIDER_SITE_OTHER): Payer: Medicare Other | Admitting: Rehabilitative and Restorative Service Providers"

## 2022-03-29 ENCOUNTER — Encounter: Payer: Self-pay | Admitting: Rehabilitative and Restorative Service Providers"

## 2022-03-29 DIAGNOSIS — R262 Difficulty in walking, not elsewhere classified: Secondary | ICD-10-CM | POA: Diagnosis not present

## 2022-03-29 DIAGNOSIS — M6281 Muscle weakness (generalized): Secondary | ICD-10-CM | POA: Diagnosis not present

## 2022-03-29 DIAGNOSIS — M25661 Stiffness of right knee, not elsewhere classified: Secondary | ICD-10-CM

## 2022-03-29 DIAGNOSIS — G8929 Other chronic pain: Secondary | ICD-10-CM

## 2022-03-29 DIAGNOSIS — M25561 Pain in right knee: Secondary | ICD-10-CM | POA: Diagnosis not present

## 2022-03-29 DIAGNOSIS — R600 Localized edema: Secondary | ICD-10-CM

## 2022-03-29 NOTE — Therapy (Addendum)
OUTPATIENT PHYSICAL THERAPY TREATMENT NOTE  Patient Name: Elaine Navarro MRN: [id number] DOB:08/09/1946, 76 y.o., female Today's Date: 03/29/2022  PCP: Vernie Shanks, MD REFERRING PROVIDER: Mcarthur Rossetti, MD  END OF SESSION:   PT End of Session - 03/29/22 1052     Visit Number 17    Number of Visits 20    Date for PT Re-Evaluation 04/20/22    Authorization Type Medicare, KX at 15    Progress Note Due on Visit 26    PT Start Time 1058    PT Stop Time 1148    PT Time Calculation (min) 50 min    Activity Tolerance Patient tolerated treatment well    Behavior During Therapy [facility] for tasks assessed/performed                Past Medical History:  Diagnosis Date   Aneurysm of renal artery ([place])    followed at [facility], stable with CT/angiogram 10/2021   Fracture of right wrist 2011   Fell in bathroom.   Hypertension    Left thyroid nodule 2003   S/P biopsy - benign   Lymphocytic colitis 03/2013   Migraines    Osteoporosis    PONV (postoperative nausea and vomiting)    Sciatica    Visceral injury    detached due to recent fall   Past Surgical History:  Procedure Laterality Date   ABDOMINOPLASTY  2010   [place] EXCISIONAL BIOPSY Bilateral 2001   x 3   CATARACT EXTRACTION, BILATERAL Bilateral 01/2020   Dental implants  1993   HEMORRHOID SURGERY  2000   KNEE ARTHROSCOPY W/ DEBRIDEMENT Right 07/2020   Dr. Berenice Primas   TOTAL KNEE ARTHROPLASTY Right 01/21/2022   Procedure: RIGHT TOTAL KNEE ARTHROPLASTY;  Surgeon: Mcarthur Rossetti, MD;  Location: [facility];  Service: Orthopedics;  Laterality: Right;  RFNA   VAGINAL HYSTERECTOMY  1974   Dysfunctional uterine bleeding.   Patient Active Problem List   Diagnosis Date Noted   Status post right knee replacement 01/21/2022   Arthritis of right knee 01/20/2022   Lung nodules 12/28/2021   Primary hypertension 05/13/2021   Thyroid nodule 05/13/2021   History of depression 05/13/2021   H/O:  hysterectomy 05/13/2021   Postmenopausal 05/13/2021   Piriformis syndrome of left side 04/07/2021   Chronic pain of right knee 03/04/2021   H/O sciatica 11/04/2020   Age-related osteoporosis without current pathological fracture 11/04/2020   Vitamin D insufficiency 12/20/2019   Actinic keratosis 03/21/2017   Focal lymphocytic colitis 05/29/2013   Aneurysm artery, renal ([place]) 09/28/2012   Headache, chronic migraine without aura, intractable 03/02/2012    REFERRING DIAG:  X48.016 (ICD-10-CM) - Status post right knee replacement  THERAPY DIAG:  Chronic pain of right knee  Muscle weakness (generalized)  Difficulty in walking, not elsewhere classified  Stiffness of right knee, not elsewhere classified  Localized edema  Rationale for Evaluation and Treatment Rehabilitation  PERTINENT HISTORY: Osteoporosis, HTN  PRECAUTIONS: None  SUBJECTIVE:   PAIN:  NPRS scale: 0/10 upon arrival, 7/10 at nighttime Pain location: Rt knee Pain description:  stiffness, swelling pressure, insidious sharp Aggravating factors: Prolonged walking, nighttime pain Relieving factors: medicine, heat   OBJECTIVE:    PATIENT SURVEYS:  03/29/2022:  FOTO update   03/07/2022:  FOTO update 55  02/09/2022 FOTO intake  21    predicted 43    COGNITION: 02/09/2022  Overall cognitive status: Within functional limits for tasks assessed  EDEMA:  02/09/2022  Localized edema noted Rt knee/leg   PALPATION: 02/09/2022 Tenderness noted anterior knee joint bilateral, incision region, posterior knee/distal thigh Rt   LOWER EXTREMITY ROM:   Active ROM Right Eval 02/09/22 Left Eval 02/09/22 Right 02/10/22 Right 02/15/22 Right 02/21/22 Right 03/02/22 Right 03/09/2022 Right 03/25/2022  Hip flexion            Hip extension            Hip abduction            Hip adduction            Hip internal rotation            Hip external rotation            Knee flexion AROM in supine heel slide   67    AROM supine 71 AROM supine 84 AROM Supine 95 Seated P: 106* A: 98* AROM supine heel slide: 114  AROM supine heel slide 125  Knee extension -10 in seated LAQ AROM   -6 in supine heel prop PROM   AROM supine -5  AROM supine quad set - 0 degrees Seated P: -2* A: -6*  0 deg seated LAQ  Ankle dorsiflexion            Ankle plantarflexion            Ankle inversion            Ankle eversion             (Blank rows = not tested)   LOWER EXTREMITY MMT:   MMT Right Eval 02/09/22 Left Eval 02/09/22 Right 02/28/2022 Left 02/28/2022 Right 03/25/2022  Hip flexion 4/5 5/5     Hip extension         Hip abduction         Hip adduction         Hip internal rotation         Hip external rotation         Knee flexion 4/5 5/5     Knee extension 3+/5 5/5 5/5 37.9, 32.5 lbs 5/5 39.8, 33 lbs 5/5 38, 36 lbs  Ankle dorsiflexion 5/5 5/5     Ankle plantarflexion         Ankle inversion         Ankle eversion          (Blank rows = not tested)   FUNCTIONAL TESTS:  03/25/2022:   Rt SLS:  30 seconds  02/09/2022  18 inch chair transfer: able to perform s UE assist but most WB on Lt leg Lt SLS:             15 seconds     Rt SLS: 3 seconds   GAIT: 03/22/2022: independent ambulation in clinic time.   02/21/2022:  Use of SPC in Lt UE while in clinic c supervision provided  02/09/2022  Distance walked: Household distances Assistive device utilized:  [employer] Comments: maintained mid range knee flexion throughout, reduced stance on Rt c limited hip extension/terminal stance phase.       TODAY'S TREATMENT: 03/29/2022:   Therex:  UBE LE only lvl 4.0 9 mins   Leg extension machine  double leg up, Rt leg lowering slowly 2 x 10 5 lbs  Rt leg curl machine 10 lbs 2 x 10    TherActivity  Leg press Rt 2 x 15 62 lbs, 1-2 pause in end ranges, slow lowering  Lateral step  down WB on bilateral 2 x 10 6 inch step light one hand touch on bar   Lateral stepping grapevine with husband Lt and Rt 20 ft x 5  each way for dance related movement training  Neuro [employer]  [employer] wobble board fwd/back 30x each c SBA  SLS on foam c anterior, anterior/lateral, anterior/medial cone touching c contralateral leg x 10 Rt leg balance c SBA  SLS c vector reach contralateral leg fwd/lateral/back x 8 bilateral  Vasopneumatic 10 mins medium compression Rt knee in elevation, 34 degrees  03/25/2022:   Therex:  UBE LE only lvl 4.0 9 mins  Seated isometric extension hold 5 sec x 3 Lt  Supine heel slide Rt x 2   Review of HEP for use at night and to interrupt sitting time to reduced stiffness complaints.   TherActivity  Leg press Rt 2 x 15 56 lbs, additional time spent for slow controlled movement focus  Lateral step down WB on Rt 2 x 10 6 inch step light one hand touch on bar   Neuro [employer]  Fitter wobble board fwd/back 30x each c SBA  SLS on foam c anterior, anterior/lateral, lateral cone touching c contralateral leg x 10 Rt leg balance c SBA  03/22/2022: Therex:  UBE LE only lvl 1 10 mins for ROM  Seated LAQ 7 lbs 2 x 15 Rt,  attempted machine LAQ but stopped due to pain symptoms.   Supine LAQ c cues for home for symptom management x 10 in clinic    TherActivity  Leg press Rt 43 lbs 2 x 15  Step on over and down WB on Rt 6 inch 2 x 10 c light hand rail assist   HOME EXERCISE PROGRAM: Access Code: [email or web address] URL: [email or web address] Date: 02/15/2022 Prepared by:    Exercises - Supine Heel Slide with Strap (Mirrored)  - 2-3 x daily - 7 x weekly - 1 sets - 10 reps - 5 hold - Seated Long Arc Quad  - 3-5 x daily - 7 x weekly - 1-2 sets - 10 reps - 2 hold - Seated Quad Set  - 3-5 x daily - 7 x weekly - 1 sets - 10 reps - 5 hold - Seated Passive Knee Extension with Weight  - 5 x daily - 7 x weekly - 1 sets - 1 reps - to tolerance hold - Seated Knee Flexion AAROM  - 3-5 x daily - 7 x weekly - 1 sets - 1 reps - 3 minutes hold - [employer]  - 3-5 x daily - 7 x weekly - 1 sets -  1-2 mins hold   ASSESSMENT:   CLINICAL IMPRESSION: Lowering eccentric control in WB continued to show room for improvement to help stair navigation.  Compliant surface balance fair at this time.  Pt to return to MD for regular follow up prior to next visit in therapy clinic.  Follow up on results from visit.   Medical necessity for continued treatment indicated to improve functional movement ability.     OBJECTIVE IMPAIRMENTS Abnormal gait, decreased activity tolerance, decreased balance, decreased coordination, decreased endurance, decreased mobility, difficulty walking, decreased ROM, decreased strength, hypomobility, increased edema, impaired perceived functional ability, impaired flexibility, improper body mechanics, and pain.    ACTIVITY LIMITATIONS carrying, lifting, bending, sitting, standing, squatting, sleeping, stairs, transfers, bed mobility, and locomotion level   PARTICIPATION LIMITATIONS: cleaning, laundry, driving, shopping, community activity, and yard work   PERSONAL FACTORS  Osteoporosis, HTN  are also affecting patient's   functional outcome.    REHAB POTENTIAL: Good   CLINICAL DECISION MAKING: Stable/uncomplicated   EVALUATION COMPLEXITY: Low     GOALS: Goals reviewed with patient? Yes   Short term PT Goals (target date for Short term goals are 3 weeks 03/02/2022) Patient will demonstrate independent use of home exercise program to maintain progress from in clinic treatments. Goal status: MET - assessed 02/23/2022   Long term PT goals (target dates for all long term goals are 10 weeks  04/20/2022 )   1. Patient will demonstrate/report pain at worst less than or equal to 2/10 to facilitate minimal limitation in daily activity secondary to pain symptoms. Goal status: on going - assessed 03/25/2022   2. Patient will demonstrate independent use of home exercise program to facilitate ability to maintain/progress functional gains from skilled physical therapy services. Goal  status:  on going - assessed 03/25/2022   3. Patient will demonstrate FOTO outcome > or = 43 % to indicate reduced disability due to condition. Goal status:  MET - assessed 03/25/2022   4.  Patient will demonstrate Rt  knee AROM 0-110 degrees to facilitate ability to perform transfers, sitting, ambulation, stair navigation s restriction due to mobility. Goal status:  MET - assessed 03/25/2022   5.  Patient will demonstrate Rt LE MMT 5/5 throughout to facilitate ability to perform usual standing, walking, stairs at PLOF s limitation due to symptoms.   Goal status:  MET - assessed 03/25/2022   6.  Patient will demonstrate independent ambulation community distances > 300 ft to facilitate community integration at PLOF.   Goal status: Met - assessed 03/25/2022   7.  Patient will demonstrate ascending/descending stairs c reciprocal gait pattern without handrail assist for house entry.  a.  Goal Status: on going  - assessed 03/25/2022     PLAN: PT FREQUENCY: 2x-3x /week (3x week for first 1-2 weeks)   PT DURATION: 12 weeks   PLANNED INTERVENTIONS: Therapeutic exercises, Therapeutic activity, Neuro Muscular re-education, Balance training, Gait training, Patient/Family education, Joint mobilization, Stair training, DME instructions, Dry Needling, Electrical stimulation, Cryotherapy, Moist heat, Taping, Ultrasound, Ionotophoresis 4mg/ml Dexamethasone, and Manual therapy.  All included unless contraindicated   PLAN FOR NEXT SESSION:   Plan for reduced frequency due to overall improvements and to early start transitioning towards HEP plan.    , PT, DPT, OCS, ATC 03/29/22  11:45 AM      

## 2022-03-31 ENCOUNTER — Encounter: Payer: Self-pay | Admitting: Orthopaedic Surgery

## 2022-03-31 ENCOUNTER — Ambulatory Visit (INDEPENDENT_AMBULATORY_CARE_PROVIDER_SITE_OTHER): Payer: Medicare Other | Admitting: Orthopaedic Surgery

## 2022-03-31 DIAGNOSIS — Z96651 Presence of right artificial knee joint: Secondary | ICD-10-CM

## 2022-03-31 MED ORDER — AMOXICILLIN 500 MG PO TABS: 500.0000 mg | ORAL_TABLET | Freq: Two times a day (BID) | ORAL | 0 refills | Status: DC

## 2022-03-31 NOTE — Progress Notes (Signed)
The patient is now 10 weeks status post a right total knee arthroplasty.  She is working well physical therapy and still having trouble sleeping at night.  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  She takes an occasional oxycodone and says Neurontin is really helped her the most.  She is concerned about potential for dental abscess so she would like me to put her on antibiotics prior to her seeing a dental specialist and I agree with this and I sent in amoxicillin for her to take.  Her right operative knee has some mild swelling.  She lacks full extension by just a few degrees but flexes to 120 degrees.  The knee feels ligamentously stable.  From my standpoint I do not need to see her back for 3 months unless she is having issues.  We will have a standing AP and lateral of her right knee at that visit.  Again I reinforced her seeking out dental treatment soon.  If there are any issues before 3 months she knows to come see [place] immediately.

## 2022-04-01 ENCOUNTER — Encounter: Payer: Self-pay | Admitting: Rehabilitative and Restorative Service Providers"

## 2022-04-01 ENCOUNTER — Ambulatory Visit (INDEPENDENT_AMBULATORY_CARE_PROVIDER_SITE_OTHER): Payer: Medicare Other | Admitting: Rehabilitative and Restorative Service Providers"

## 2022-04-01 DIAGNOSIS — M25661 Stiffness of right knee, not elsewhere classified: Secondary | ICD-10-CM | POA: Diagnosis not present

## 2022-04-01 DIAGNOSIS — M6281 Muscle weakness (generalized): Secondary | ICD-10-CM | POA: Diagnosis not present

## 2022-04-01 DIAGNOSIS — R600 Localized edema: Secondary | ICD-10-CM

## 2022-04-01 DIAGNOSIS — G8929 Other chronic pain: Secondary | ICD-10-CM

## 2022-04-01 DIAGNOSIS — M25561 Pain in right knee: Secondary | ICD-10-CM | POA: Diagnosis not present

## 2022-04-01 DIAGNOSIS — R262 Difficulty in walking, not elsewhere classified: Secondary | ICD-10-CM | POA: Diagnosis not present

## 2022-04-01 NOTE — Therapy (Signed)
OUTPATIENT PHYSICAL THERAPY TREATMENT NOTE  Patient Name: Elaine Navarro MRN: [id number] DOB:1946/08/28, 76 y.o., female Today's Date: 04/01/2022  PCP: Vernie Shanks, MD REFERRING PROVIDER: Mcarthur Rossetti, MD  END OF SESSION:   PT End of Session - 04/01/22 1140     Visit Number 18    Number of Visits 20    Date for PT Re-Evaluation 04/20/22    Authorization Type Medicare, KX at 15    Progress Note Due on Visit 26    PT Start Time 1145    PT Stop Time 1243    PT Time Calculation (min) 58 min    Activity Tolerance Patient tolerated treatment well    Behavior During Therapy [facility] for tasks assessed/performed              Past Medical History:  Diagnosis Date   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   Hypertension    Left thyroid nodule 2003   S/P biopsy - benign   Lymphocytic colitis 03/2013   Migraines    Osteoporosis    PONV (postoperative nausea and vomiting)    Sciatica    Visceral injury    detached due to recent fall   Past Surgical History:  Procedure Laterality Date   ABDOMINOPLASTY  2010   [facility] EXCISIONAL BIOPSY Bilateral 2001   x 3   CATARACT EXTRACTION, BILATERAL Bilateral 01/2020   Dental implants  1993   HEMORRHOID SURGERY  2000   KNEE ARTHROSCOPY W/ DEBRIDEMENT Right 07/2020   Dr. Berenice Primas   TOTAL KNEE ARTHROPLASTY Right 01/21/2022   Procedure: RIGHT TOTAL KNEE ARTHROPLASTY;  Surgeon: Mcarthur Rossetti, MD;  Location: [facility];  Service: Orthopedics;  Laterality: Right;  RFNA   VAGINAL HYSTERECTOMY  1974   Dysfunctional uterine bleeding.   Patient Active Problem List   Diagnosis Date Noted   Status post right knee replacement 01/21/2022   Arthritis of right knee 01/20/2022   Lung nodules 12/28/2021   Primary hypertension 05/13/2021   Thyroid nodule 05/13/2021   History of depression 05/13/2021   H/O:  hysterectomy 05/13/2021   Postmenopausal 05/13/2021   Piriformis syndrome of left side 04/07/2021   Chronic pain of right knee 03/04/2021   H/O sciatica 11/04/2020   Age-related osteoporosis without current pathological fracture 11/04/2020   Vitamin D insufficiency 12/20/2019   Actinic keratosis 03/21/2017   Focal lymphocytic colitis 05/29/2013   Aneurysm artery, renal ([place]) 09/28/2012   Headache, chronic migraine without aura, intractable 03/02/2012    REFERRING DIAG:  P59.163 (ICD-10-CM) - Status post right knee replacement  THERAPY DIAG:  Chronic pain of right knee  Muscle weakness (generalized)  Difficulty in walking, not elsewhere classified  Stiffness of right knee, not elsewhere classified  Localized edema  Rationale for Evaluation and Treatment Rehabilitation  PERTINENT HISTORY: Osteoporosis, HTN  PRECAUTIONS: None  SUBJECTIVE: She reports that the physician visit went well, and she and her husband feel pleased with progress so far.  PAIN:  NPRS scale: 0/10 upon arrival, 7/10 at nighttime Pain location: Rt knee Pain description:  stiffness, swelling pressure, insidious sharp Aggravating factors: Prolonged walking, nighttime pain Relieving factors: medicine, heat   OBJECTIVE:    PATIENT SURVEYS:  03/29/2022:  FOTO update   03/07/2022:  FOTO update 55  02/09/2022 FOTO intake  21    predicted 43  COGNITION: 02/09/2022  Overall cognitive status: Within functional limits for tasks assessed                              EDEMA:  02/09/2022  Localized edema noted Rt knee/leg   PALPATION: 02/09/2022 Tenderness noted anterior knee joint bilateral, incision region, posterior knee/distal thigh Rt   LOWER EXTREMITY ROM:   Active ROM Right Eval 02/09/22 Left Eval 02/09/22 Right 02/10/22 Right 02/15/22 Right 02/21/22 Right 03/02/22 Right 03/09/2022 Right 03/25/2022  Hip flexion            Hip extension            Hip abduction            Hip adduction             Hip internal rotation            Hip external rotation            Knee flexion AROM in supine heel slide  67    AROM supine 71 AROM supine 84 AROM Supine 95 Seated P: 106* A: 98* AROM supine heel slide: 114  AROM supine heel slide 125  Knee extension -10 in seated LAQ AROM   -6 in supine heel prop PROM   AROM supine -5  AROM supine quad set - 0 degrees Seated P: -2* A: -6*  0 deg seated LAQ  Ankle dorsiflexion            Ankle plantarflexion            Ankle inversion            Ankle eversion             (Blank rows = not tested)   LOWER EXTREMITY MMT:   MMT Right Eval 02/09/22 Left Eval 02/09/22 Right 02/28/2022 Left 02/28/2022 Right 03/25/2022  Hip flexion 4/5 5/5     Hip extension         Hip abduction         Hip adduction         Hip internal rotation         Hip external rotation         Knee flexion 4/5 5/5     Knee extension 3+/5 5/5 5/5 37.9, 32.5 lbs 5/5 39.8, 33 lbs 5/5 38, 36 lbs  Ankle dorsiflexion 5/5 5/5     Ankle plantarflexion         Ankle inversion         Ankle eversion          (Blank rows = not tested)   FUNCTIONAL TESTS:  04/01/2022: 6" eccentric step down - RLE stance has some knee wobbling and decreased eccentric control with increase in quad fatigue and pain, continues to require single UE support for stability  03/25/2022:   Rt SLS:  30 seconds  02/09/2022  18 inch chair transfer: able to perform s UE assist but most WB on Lt leg Lt SLS:             15 seconds     Rt SLS: 3 seconds   GAIT: 03/22/2022: independent ambulation in clinic time.   02/21/2022:  Use of SPC in Lt UE while in clinic c supervision provided  02/09/2022  Distance walked: Household distances Assistive device utilized:  [employer] Comments: maintained mid range knee flexion throughout, reduced stance on Rt c limited hip extension/terminal  stance phase.       TODAY'S TREATMENT: 04/01/2022: Therex:  UBE LE only lvl 4.0 10 mins   Leg extension machine  double leg up,  Rt leg lowering slowly 2 x 15 5 lbs  Rt leg curl machine 10 lbs 2 x 15  TherActivity  Leg press Rt 2 x 15 62 lbs, 1-2 pause in end ranges  Lateral step down WB on Rt 1 x 15 on 6 inch step light one hand touch on bar  Forward step down WB on Rt 1 x 15 on 6 inch step light one hand touch on bar, increase in tightness sx with returning to step Hip twist step x 15 dance-related movement  Neuro Re-ed  Fitter wobble board fwd/back 30x each c SBA  SLS on foam 4 corner reaching Rt leg x 5  SLS on foam reach contralateral leg fwd/lateral/back x 5 bilateral  [place] on foam beam x 4 bil. with fingertip support //bars or partner, standing grape[place] no UE support 8' x 2  Vasopneumatic 10 mins medium compression Rt knee in elevation, 34 degrees  03/29/2022:   Therex:  UBE LE only lvl 4.0 9 mins   Leg extension machine  double leg up, Rt leg lowering slowly 2 x 10 5 lbs  Rt leg curl machine 10 lbs 2 x 10    TherActivity  Leg press Rt 2 x 15 62 lbs, 1-2 pause in end ranges, slow lowering  Lateral step down WB on bilateral 2 x 10 6 inch step light one hand touch on bar   Lateral stepping [place] with husband Lt and Rt 20 ft x 5 each way for dance related movement training  Neuro [employer] fwd/back 30x each c SBA  SLS on foam c anterior, anterior/lateral, anterior/medial cone touching c contralateral leg x 10 Rt leg balance c SBA  SLS c vector reach contralateral leg fwd/lateral/back x 8 bilateral  Vasopneumatic 10 mins medium compression Rt knee in elevation, 34 degrees  03/25/2022:   Therex:  UBE LE only lvl 4.0 9 mins  Seated isometric extension hold 5 sec x 3 Lt  Supine heel slide Rt x 2   Review of HEP for use at night and to interrupt sitting time to reduced stiffness complaints.   TherActivity  Leg press Rt 2 x 15 56 lbs, additional time spent for slow controlled movement focus  Lateral step down WB on Rt 2 x 10 6 inch step light one hand touch on bar   Neuro  [employer] fwd/back 30x each c SBA  SLS on foam c anterior, anterior/lateral, lateral cone touching c contralateral leg x 10 Rt leg balance c SBA   HOME EXERCISE PROGRAM: Access Code: [email or web address] URL: [email or web address] Date: 02/15/2022 Prepared by: Scot Jun  Exercises - Supine Heel Slide with Strap (Mirrored)  - 2-3 x daily - 7 x weekly - 1 sets - 10 reps - 5 hold - Seated Long Arc Quad  - 3-5 x daily - 7 x weekly - 1-2 sets - 10 reps - 2 hold - Seated Quad Set  - 3-5 x daily - 7 x weekly - 1 sets - 10 reps - 5 hold - Seated Passive Knee Extension with Weight  - 5 x daily - 7 x weekly - 1 sets - 1 reps - to tolerance hold - Seated Knee Flexion AAROM  - 3-5 x daily - 7 x weekly - 1 sets - 1 reps -  3 minutes hold - [place]  - 3-5 x daily - 7 x weekly - 1 sets - 1-2 mins hold   ASSESSMENT:   CLINICAL IMPRESSION: She continues to benefit from eccentric strengthening to improve stair negotiation pain and stability. Dynamic balance tasks on compliant surfaces requires some UE support, but she is progressing. She continues to benefit from skilled PT to meet remaining goals of independent reciprocal stair negotiation and returning to dance.    OBJECTIVE IMPAIRMENTS Abnormal gait, decreased activity tolerance, decreased balance, decreased coordination, decreased endurance, decreased mobility, difficulty walking, decreased ROM, decreased strength, hypomobility, increased edema, impaired perceived functional ability, impaired flexibility, improper body mechanics, and pain.    ACTIVITY LIMITATIONS carrying, lifting, bending, sitting, standing, squatting, sleeping, stairs, transfers, bed mobility, and locomotion level   PARTICIPATION LIMITATIONS: cleaning, laundry, driving, shopping, community activity, and yard work   PERSONAL FACTORS  Osteoporosis, HTN  are also affecting patient's functional outcome.    REHAB POTENTIAL: Good   CLINICAL DECISION MAKING:  Stable/uncomplicated   EVALUATION COMPLEXITY: Low     GOALS: Goals reviewed with patient? Yes   Short term PT Goals (target date for Short term goals are 3 weeks 03/02/2022) Patient will demonstrate independent use of home exercise program to maintain progress from in clinic treatments. Goal status: MET - assessed 02/23/2022   Long term PT goals (target dates for all long term goals are 10 weeks  04/20/2022 )   1. Patient will demonstrate/report pain at worst less than or equal to 2/10 to facilitate minimal limitation in daily activity secondary to pain symptoms. Goal status: on going - assessed 03/25/2022   2. Patient will demonstrate independent use of home exercise program to facilitate ability to maintain/progress functional gains from skilled physical therapy services. Goal status:  on going - assessed 03/25/2022   3. Patient will demonstrate FOTO outcome > or = 43 % to indicate reduced disability due to condition. Goal status:  MET - assessed 03/25/2022   4.  Patient will demonstrate Rt  knee AROM 0-110 degrees to facilitate ability to perform transfers, sitting, ambulation, stair navigation s restriction due to mobility. Goal status:  MET - assessed 03/25/2022   5.  Patient will demonstrate Rt LE MMT 5/5 throughout to facilitate ability to perform usual standing, walking, stairs at PLOF s limitation due to symptoms.   Goal status:  MET - assessed 03/25/2022   6.  Patient will demonstrate independent ambulation community distances > 300 ft to facilitate community integration at [facility].   Goal status: Met - assessed 03/25/2022   7.  Patient will demonstrate ascending/descending stairs c reciprocal gait pattern without handrail assist for house entry.  a.  Goal Status: on going  - assessed 03/25/2022     PLAN: PT FREQUENCY: 2x-3x /week (3x week for first 1-2 weeks)   PT DURATION: 12 weeks   PLANNED INTERVENTIONS: Therapeutic exercises, Therapeutic activity, Neuro Muscular  re-education, Balance training, Gait training, Patient/Family education, Joint mobilization, Stair training, DME instructions, Dry Needling, Electrical stimulation, Cryotherapy, Moist heat, Taping, Ultrasound, Ionotophoresis 48m/ml Dexamethasone, and Manual therapy.  All included unless contraindicated   PLAN FOR NEXT SESSION:   Continue emphasis on eccentric quad strengthening through full knee range and dynamic balance tasks, vaso   KJana Hakim SPT 04/01/2022 12:53 PM  This entire session of physical therapy was performed under the direct supervision of PT signing evaluation /treatment. PT reviewed note and agrees.  MScot Jun PT, DPT, OCS, ATC 04/01/22  12:55 PM

## 2022-04-02 ENCOUNTER — Encounter: Payer: Self-pay | Admitting: Orthopaedic Surgery

## 2022-04-04 ENCOUNTER — Ambulatory Visit (INDEPENDENT_AMBULATORY_CARE_PROVIDER_SITE_OTHER): Payer: Medicare Other | Admitting: Rehabilitative and Restorative Service Providers"

## 2022-04-04 ENCOUNTER — Encounter: Payer: Self-pay | Admitting: Rehabilitative and Restorative Service Providers"

## 2022-04-04 ENCOUNTER — Other Ambulatory Visit (HOSPITAL_BASED_OUTPATIENT_CLINIC_OR_DEPARTMENT_OTHER): Payer: Self-pay | Admitting: *Deleted

## 2022-04-04 DIAGNOSIS — G8929 Other chronic pain: Secondary | ICD-10-CM

## 2022-04-04 DIAGNOSIS — M25661 Stiffness of right knee, not elsewhere classified: Secondary | ICD-10-CM

## 2022-04-04 DIAGNOSIS — Z8659 Personal history of other mental and behavioral disorders: Secondary | ICD-10-CM

## 2022-04-04 DIAGNOSIS — R262 Difficulty in walking, not elsewhere classified: Secondary | ICD-10-CM | POA: Diagnosis not present

## 2022-04-04 DIAGNOSIS — M25561 Pain in right knee: Secondary | ICD-10-CM | POA: Diagnosis not present

## 2022-04-04 DIAGNOSIS — M6281 Muscle weakness (generalized): Secondary | ICD-10-CM | POA: Diagnosis not present

## 2022-04-04 DIAGNOSIS — R600 Localized edema: Secondary | ICD-10-CM | POA: Diagnosis not present

## 2022-04-04 MED ORDER — BUPROPION HCL ER (XL) 150 MG PO TB24: 150.0000 mg | ORAL_TABLET | Freq: Every day | ORAL | 0 refills | Status: DC

## 2022-04-04 NOTE — Therapy (Signed)
OUTPATIENT PHYSICAL THERAPY TREATMENT NOTE  Patient Name: Elaine Navarro MRN: [id number] DOB:07/26/1946, 76 y.o., female Today's Date: 04/04/2022  PCP: Francis P Wong, MD REFERRING PROVIDER: Christopher Y Blackman, MD  END OF SESSION:   PT End of Session - 04/04/22 1129     Visit Number 19    Number of Visits 20    Date for PT Re-Evaluation 04/20/22    Authorization Type Medicare, KX at 15    Progress Note Due on Visit 26    PT Start Time 1130    PT Stop Time 1225    PT Time Calculation (min) 55 min    Activity Tolerance Patient tolerated treatment well    Behavior During Therapy WFL for tasks assessed/performed               Past Medical History:  Diagnosis Date   Aneurysm of renal artery (HCC)    followed at [facility], stable with CT/angiogram 10/2021   Fracture of right wrist 2011   Fell in bathroom.   Hypertension    Left thyroid nodule 2003   S/P biopsy - benign   Lymphocytic colitis 03/2013   Migraines    Osteoporosis    PONV (postoperative nausea and vomiting)    Sciatica    Visceral injury    detached due to recent fall   Past Surgical History:  Procedure Laterality Date   ABDOMINOPLASTY  2010   APPENDECTOMY  1962   BREAST EXCISIONAL BIOPSY Bilateral 2001   x 3   CATARACT EXTRACTION, BILATERAL Bilateral 01/2020   Dental implants  1993   HEMORRHOID SURGERY  2000   KNEE ARTHROSCOPY W/ DEBRIDEMENT Right 07/2020   Dr. Graves   TOTAL KNEE ARTHROPLASTY Right 01/21/2022   Procedure: RIGHT TOTAL KNEE ARTHROPLASTY;  Surgeon: Blackman, Christopher Y, MD;  Location: [facility];  Service: Orthopedics;  Laterality: Right;  RFNA   VAGINAL HYSTERECTOMY  1974   Dysfunctional uterine bleeding.   Patient Active Problem List   Diagnosis Date Noted   Status post right knee replacement 01/21/2022   Arthritis of right knee 01/20/2022   Lung nodules 12/28/2021   Primary hypertension 05/13/2021   Thyroid nodule 05/13/2021   History of depression 05/13/2021   H/O:  hysterectomy 05/13/2021   Postmenopausal 05/13/2021   Piriformis syndrome of left side 04/07/2021   Chronic pain of right knee 03/04/2021   H/O sciatica 11/04/2020   Age-related osteoporosis without current pathological fracture 11/04/2020   Vitamin D insufficiency 12/20/2019   Actinic keratosis 03/21/2017   Focal lymphocytic colitis 05/29/2013   Aneurysm artery, renal (HCC) 09/28/2012   Headache, chronic migraine without aura, intractable 03/02/2012    REFERRING DIAG:  Z96.651 (ICD-10-CM) - Status post right knee replacement  THERAPY DIAG:  Chronic pain of right knee  Muscle weakness (generalized)  Difficulty in walking, not elsewhere classified  Stiffness of right knee, not elsewhere classified  Localized edema  Rationale for Evaluation and Treatment Rehabilitation  PERTINENT HISTORY: Osteoporosis, HTN  PRECAUTIONS: None  SUBJECTIVE:   Pt indicated going to event that resulted in a lot of walking/standing.  Reported swelling afterward.  No pain indicated upon arrival today.   PAIN:  NPRS scale: 0/10 upon arrival.  Pain location: Rt knee Pain description: stiff/swollen Aggravating factors: Prolonged walking/standing Relieving factors: medicine, heat   OBJECTIVE:    PATIENT SURVEYS:  04/04/2022:  FOTO update : 61  03/07/2022:  FOTO update 55  02/09/2022 FOTO intake  21    predicted 43      COGNITION: 02/09/2022  Overall cognitive status: Within functional limits for tasks assessed                              EDEMA:  02/09/2022  Localized edema noted Rt knee/leg   PALPATION: 02/09/2022 Tenderness noted anterior knee joint bilateral, incision region, posterior knee/distal thigh Rt   LOWER EXTREMITY ROM:   Active ROM Right Eval 02/09/22 Left Eval 02/09/22 Right 02/10/22 Right 02/15/22 Right 02/21/22 Right 03/02/22 Right 03/09/2022 Right 03/25/2022  Hip flexion            Hip extension            Hip abduction            Hip adduction            Hip internal  rotation            Hip external rotation            Knee flexion AROM in supine heel slide  67    AROM supine 71 AROM supine 84 AROM Supine 95 Seated P: 106* A: 98* AROM supine heel slide: 114  AROM supine heel slide 125  Knee extension -10 in seated LAQ AROM   -6 in supine heel prop PROM   AROM supine -5  AROM supine quad set - 0 degrees Seated P: -2* A: -6*  0 deg seated LAQ  Ankle dorsiflexion            Ankle plantarflexion            Ankle inversion            Ankle eversion             (Blank rows = not tested)   LOWER EXTREMITY MMT:   MMT Right Eval 02/09/22 Left Eval 02/09/22 Right 02/28/2022 Left 02/28/2022 Right 03/25/2022  Hip flexion 4/5 5/5     Hip extension         Hip abduction         Hip adduction         Hip internal rotation         Hip external rotation         Knee flexion 4/5 5/5     Knee extension 3+/5 5/5 5/5 37.9, 32.5 lbs 5/5 39.8, 33 lbs 5/5 38, 36 lbs  Ankle dorsiflexion 5/5 5/5     Ankle plantarflexion         Ankle inversion         Ankle eversion          (Blank rows = not tested)   FUNCTIONAL TESTS:  04/01/2022: 6" eccentric step down - RLE stance has some knee wobbling and decreased eccentric control with increase in quad fatigue and pain, continues to require single UE support for stability  03/25/2022:   Rt SLS:  30 seconds  02/09/2022  18 inch chair transfer: able to perform s UE assist but most WB on Lt leg Lt SLS:             15 seconds     Rt SLS: 3 seconds   GAIT: 03/22/2022: independent ambulation in clinic time.   02/21/2022:  Use of SPC in Lt UE while in clinic c supervision provided  02/09/2022  Distance walked: Household distances Assistive device utilized:  [employer] Comments: maintained mid range knee flexion throughout, reduced stance on Rt c limited hip extension/terminal  stance phase.       TODAY'S TREATMENT: 04/04/2022: Therex:  UBE LE only lvl 4.0 6 mins   Seated SLR 2 x 10 Lt slow lowering focus    TherActivity (to improve stair navigation, dance movement patterns)  Leg press Rt 2 x 15 62 lbs, 1-2 pause in end ranges  Lateral step down WB on Rt 2 x 15 on 6 inch step light one hand touch on bar  Dance with husband stepping within square of cones (movements fwd, lateral, diagonal fwd/back - x 5 each way Dance with husband hand held support hip twist c lateral stepping/rocker stepping 50 % speed x 8 (cues for home use) Sit to stand to sit no UE x 15 c slow lowering focus 17 inch table height   Neuro Re-ed  SLS on foam 30 sec x 4 bilateral occasional HHA on bars  Tandem ambulation on ground in // bars 10 ft x 5 fwd/back  Vasopneumatic 10 mins medium compression Rt knee in elevation, 34 degrees  04/01/2022: Therex:  UBE LE only lvl 4.0 10 mins   Leg extension machine  double leg up, Rt leg lowering slowly 2 x 15 5 lbs  Rt leg curl machine 10 lbs 2 x 15  TherActivity  Leg press Rt 2 x 15 62 lbs, 1-2 pause in end ranges  Lateral step down WB on Rt 1 x 15 on 6 inch step light one hand touch on bar  Forward step down WB on Rt 1 x 15 on 6 inch step light one hand touch on bar, increase in tightness sx with returning to step Hip twist step x 15 dance-related movement  Neuro Re-ed  Fitter wobble board fwd/back 30x each c SBA  SLS on foam 4 corner reaching Rt leg x 5  SLS on foam reach contralateral leg fwd/lateral/back x 5 bilateral  [place] on foam beam x 4 bil. with fingertip support //bars or partner, standing grapevine no UE support 8' x 2  Vasopneumatic 10 mins medium compression Rt knee in elevation, 34 degrees  03/29/2022:   Therex:  UBE LE only lvl 4.0 9 mins   Leg extension machine  double leg up, Rt leg lowering slowly 2 x 10 5 lbs  Rt leg curl machine 10 lbs 2 x 10    TherActivity  Leg press Rt 2 x 15 62 lbs, 1-2 pause in end ranges, slow lowering  Lateral step down WB on bilateral 2 x 10 6 inch step light one hand touch on bar   Lateral stepping [place] with  husband Lt and Rt 20 ft x 5 each way for dance related movement training  Neuro Reed  Fitter wobble board fwd/back 30x each c SBA  SLS on foam c anterior, anterior/lateral, anterior/medial cone touching c contralateral leg x 10 Rt leg balance c SBA  SLS c vector reach contralateral leg fwd/lateral/back x 8 bilateral  Vasopneumatic 10 mins medium compression Rt knee in elevation, 34 degrees   HOME EXERCISE PROGRAM: Access Code: [email or web address] URL: [email or web address] Date: 02/15/2022 Prepared by:    Exercises - Supine Heel Slide with Strap (Mirrored)  - 2-3 x daily - 7 x weekly - 1 sets - 10 reps - 5 hold - Seated Long Arc Quad  - 3-5 x daily - 7 x weekly - 1-2 sets - 10 reps - 2 hold - Seated Quad Set  - 3-5 x daily - 7 x weekly - 1 sets - 10 reps - 5 hold - Seated   Passive Knee Extension with Weight  - 5 x daily - 7 x weekly - 1 sets - 1 reps - to tolerance hold - Seated Knee Flexion AAROM  - 3-5 x daily - 7 x weekly - 1 sets - 1 reps - 3 minutes hold - [place]  - 3-5 x daily - 7 x weekly - 1 sets - 1-2 mins hold   ASSESSMENT:   CLINICAL IMPRESSION: FOTO measurements improved compared to previous.  Continued skilled PT services necessary for continued strengthening and movement coordination improvements to allow full return to stair navigation c reciprocal gait pattern s UE assist and dance/golf activity.   Discussed graded return to dance and golf related activity when able with reduced amount/speed to allow adaptation of body to return to activities.     OBJECTIVE IMPAIRMENTS Abnormal gait, decreased activity tolerance, decreased balance, decreased coordination, decreased endurance, decreased mobility, difficulty walking, decreased ROM, decreased strength, hypomobility, increased edema, impaired perceived functional ability, impaired flexibility, improper body mechanics, and pain.    ACTIVITY LIMITATIONS carrying, lifting, bending, sitting, standing,  squatting, sleeping, stairs, transfers, bed mobility, and locomotion level   PARTICIPATION LIMITATIONS: cleaning, laundry, driving, shopping, community activity, and yard work   PERSONAL FACTORS  Osteoporosis, HTN  are also affecting patient's functional outcome.    REHAB POTENTIAL: Good   CLINICAL DECISION MAKING: Stable/uncomplicated   EVALUATION COMPLEXITY: Low     GOALS: Goals reviewed with patient? Yes   Short term PT Goals (target date for Short term goals are 3 weeks 03/02/2022) Patient will demonstrate independent use of home exercise program to maintain progress from in clinic treatments. Goal status: MET - assessed 02/23/2022   Long term PT goals (target dates for all long term goals are 10 weeks  04/20/2022 )   1. Patient will demonstrate/report pain at worst less than or equal to 2/10 to facilitate minimal limitation in daily activity secondary to pain symptoms. Goal status: on going - assessed 03/25/2022   2. Patient will demonstrate independent use of home exercise program to facilitate ability to maintain/progress functional gains from skilled physical therapy services. Goal status:  on going - assessed 03/25/2022   3. Patient will demonstrate FOTO outcome > or = 43 % to indicate reduced disability due to condition. Goal status:  MET - assessed 03/25/2022   4.  Patient will demonstrate Rt  knee AROM 0-110 degrees to facilitate ability to perform transfers, sitting, ambulation, stair navigation s restriction due to mobility. Goal status:  MET - assessed 03/25/2022   5.  Patient will demonstrate Rt LE MMT 5/5 throughout to facilitate ability to perform usual standing, walking, stairs at PLOF s limitation due to symptoms.   Goal status:  MET - assessed 03/25/2022   6.  Patient will demonstrate independent ambulation community distances > 300 ft to facilitate community integration at [facility].   Goal status: Met - assessed 03/25/2022   7.  Patient will demonstrate  ascending/descending stairs c reciprocal gait pattern without handrail assist for house entry.  a.  Goal Status: on going  - assessed 03/25/2022     PLAN: PT FREQUENCY: 2x-3x /week (3x week for first 1-2 weeks)   PT DURATION: 12 weeks   PLANNED INTERVENTIONS: Therapeutic exercises, Therapeutic activity, Neuro Muscular re-education, Balance training, Gait training, Patient/Family education, Joint mobilization, Stair training, DME instructions, Dry Needling, Electrical stimulation, Cryotherapy, Moist heat, Taping, Ultrasound, Ionotophoresis 72m/ml Dexamethasone, and Manual therapy.  All included unless contraindicated   PLAN FOR NEXT SESSION:  Compliant surface balance, quad strengthening in WB loading/progressive resistance activity.    , PT, DPT, OCS, ATC 04/04/22  12:19 PM   

## 2022-04-05 DIAGNOSIS — G894 Chronic pain syndrome: Secondary | ICD-10-CM | POA: Diagnosis not present

## 2022-04-05 DIAGNOSIS — M5430 Sciatica, unspecified side: Secondary | ICD-10-CM | POA: Diagnosis not present

## 2022-04-05 DIAGNOSIS — M25561 Pain in right knee: Secondary | ICD-10-CM | POA: Diagnosis not present

## 2022-04-05 DIAGNOSIS — M461 Sacroiliitis, not elsewhere classified: Secondary | ICD-10-CM | POA: Diagnosis not present

## 2022-04-06 ENCOUNTER — Encounter: Payer: Medicare Other | Admitting: Rehabilitative and Restorative Service Providers"

## 2022-04-06 DIAGNOSIS — R202 Paresthesia of skin: Secondary | ICD-10-CM | POA: Diagnosis not present

## 2022-04-06 DIAGNOSIS — L650 Telogen effluvium: Secondary | ICD-10-CM | POA: Diagnosis not present

## 2022-04-06 DIAGNOSIS — L658 Other specified nonscarring hair loss: Secondary | ICD-10-CM | POA: Diagnosis not present

## 2022-04-06 DIAGNOSIS — L649 Androgenic alopecia, unspecified: Secondary | ICD-10-CM | POA: Diagnosis not present

## 2022-04-06 DIAGNOSIS — L298 Other pruritus: Secondary | ICD-10-CM | POA: Diagnosis not present

## 2022-04-06 DIAGNOSIS — L578 Other skin changes due to chronic exposure to nonionizing radiation: Secondary | ICD-10-CM | POA: Diagnosis not present

## 2022-04-06 DIAGNOSIS — L568 Other specified acute skin changes due to ultraviolet radiation: Secondary | ICD-10-CM | POA: Diagnosis not present

## 2022-04-06 DIAGNOSIS — L570 Actinic keratosis: Secondary | ICD-10-CM | POA: Diagnosis not present

## 2022-04-06 DIAGNOSIS — L249 Irritant contact dermatitis, unspecified cause: Secondary | ICD-10-CM | POA: Diagnosis not present

## 2022-04-07 DIAGNOSIS — K1370 Unspecified lesions of oral mucosa: Secondary | ICD-10-CM | POA: Diagnosis not present

## 2022-04-10 ENCOUNTER — Encounter (HOSPITAL_BASED_OUTPATIENT_CLINIC_OR_DEPARTMENT_OTHER): Payer: Self-pay | Admitting: Obstetrics & Gynecology

## 2022-04-11 ENCOUNTER — Ambulatory Visit (INDEPENDENT_AMBULATORY_CARE_PROVIDER_SITE_OTHER): Payer: Medicare Other | Admitting: Rehabilitative and Restorative Service Providers"

## 2022-04-11 ENCOUNTER — Encounter: Payer: Self-pay | Admitting: Rehabilitative and Restorative Service Providers"

## 2022-04-11 DIAGNOSIS — M25561 Pain in right knee: Secondary | ICD-10-CM | POA: Diagnosis not present

## 2022-04-11 DIAGNOSIS — M25661 Stiffness of right knee, not elsewhere classified: Secondary | ICD-10-CM | POA: Diagnosis not present

## 2022-04-11 DIAGNOSIS — G8929 Other chronic pain: Secondary | ICD-10-CM

## 2022-04-11 DIAGNOSIS — M6281 Muscle weakness (generalized): Secondary | ICD-10-CM

## 2022-04-11 DIAGNOSIS — R600 Localized edema: Secondary | ICD-10-CM | POA: Diagnosis not present

## 2022-04-11 DIAGNOSIS — R262 Difficulty in walking, not elsewhere classified: Secondary | ICD-10-CM | POA: Diagnosis not present

## 2022-04-11 NOTE — Therapy (Signed)
OUTPATIENT PHYSICAL THERAPY TREATMENT NOTE  Patient Name: Elaine Navarro MRN: [id number] DOB:10/10/1945, 76 y.o., female Today's Date: 04/11/2022  PCP: Francis P Wong, MD REFERRING PROVIDER: Christopher Y Blackman, MD  END OF SESSION:   PT End of Session - 04/11/22 1350     Visit Number 20    Number of Visits 20    Date for PT Re-Evaluation 04/20/22    Authorization Type Medicare, KX at 15    Progress Note Due on Visit 26    PT Start Time 1344    PT Stop Time 1426    PT Time Calculation (min) 42 min    Activity Tolerance Patient tolerated treatment well    Behavior During Therapy WFL for tasks assessed/performed                Past Medical History:  Diagnosis Date   Aneurysm of renal artery (HCC)    followed at [facility], stable with CT/angiogram 10/2021   Fracture of right wrist 2011   Fell in bathroom.   Hypertension    Left thyroid nodule 2003   S/P biopsy - benign   Lymphocytic colitis 03/2013   Migraines    Osteoporosis    PONV (postoperative nausea and vomiting)    Sciatica    Visceral injury    detached due to recent fall   Past Surgical History:  Procedure Laterality Date   ABDOMINOPLASTY  2010   APPENDECTOMY  1962   BREAST EXCISIONAL BIOPSY Bilateral 2001   x 3   CATARACT EXTRACTION, BILATERAL Bilateral 01/2020   Dental implants  1993   HEMORRHOID SURGERY  2000   KNEE ARTHROSCOPY W/ DEBRIDEMENT Right 07/2020   Dr. Graves   TOTAL KNEE ARTHROPLASTY Right 01/21/2022   Procedure: RIGHT TOTAL KNEE ARTHROPLASTY;  Surgeon: Blackman, Christopher Y, MD;  Location: [facility];  Service: Orthopedics;  Laterality: Right;  RFNA   VAGINAL HYSTERECTOMY  1974   Dysfunctional uterine bleeding.   Patient Active Problem List   Diagnosis Date Noted   Status post right knee replacement 01/21/2022   Arthritis of right knee 01/20/2022   Lung nodules 12/28/2021   Primary hypertension 05/13/2021   Thyroid nodule 05/13/2021   History of depression 05/13/2021   H/O:  hysterectomy 05/13/2021   Postmenopausal 05/13/2021   Piriformis syndrome of left side 04/07/2021   Chronic pain of right knee 03/04/2021   H/O sciatica 11/04/2020   Age-related osteoporosis without current pathological fracture 11/04/2020   Vitamin D insufficiency 12/20/2019   Actinic keratosis 03/21/2017   Focal lymphocytic colitis 05/29/2013   Aneurysm artery, renal (HCC) 09/28/2012   Headache, chronic migraine without aura, intractable 03/02/2012    REFERRING DIAG:  Z96.651 (ICD-10-CM) - Status post right knee replacement  THERAPY DIAG:  Chronic pain of right knee  Muscle weakness (generalized)  Difficulty in walking, not elsewhere classified  Stiffness of right knee, not elsewhere classified  Localized edema  Rationale for Evaluation and Treatment Rehabilitation  PERTINENT HISTORY: Osteoporosis, HTN  PRECAUTIONS: None  SUBJECTIVE:   Pt indicated general tenderness and some clicking continued for Rt knee.  No specific pain at rest.   PAIN:  NPRS scale: 0/10 upon arrival.  Pain location: Rt knee Pain description: stiff/swollen Aggravating factors: Prolonged walking/standing Relieving factors: medicine, heat   OBJECTIVE:    PATIENT SURVEYS:  04/04/2022:  FOTO update : 61  03/07/2022:  FOTO update 55  02/09/2022 FOTO intake  21    predicted 43    COGNITION: 02/09/2022  Overall cognitive   status: Within functional limits for tasks assessed                              EDEMA:  02/09/2022  Localized edema noted Rt knee/leg   PALPATION: 02/09/2022 Tenderness noted anterior knee joint bilateral, incision region, posterior knee/distal thigh Rt   LOWER EXTREMITY ROM:   Active ROM Right Eval 02/09/22 Left Eval 02/09/22 Right 02/10/22 Right 02/15/22 Right 02/21/22 Right 03/02/22 Right 03/09/2022 Right 03/25/2022  Hip flexion            Hip extension            Hip abduction            Hip adduction            Hip internal rotation            Hip external rotation             Knee flexion AROM in supine heel slide  67    AROM supine 71 AROM supine 84 AROM Supine 95 Seated P: 106* A: 98* AROM supine heel slide: 114  AROM supine heel slide 125  Knee extension -10 in seated LAQ AROM   -6 in supine heel prop PROM   AROM supine -5  AROM supine quad set - 0 degrees Seated P: -2* A: -6*  0 deg seated LAQ  Ankle dorsiflexion            Ankle plantarflexion            Ankle inversion            Ankle eversion             (Blank rows = not tested)   LOWER EXTREMITY MMT:   MMT Right Eval 02/09/22 Left Eval 02/09/22 Right 02/28/2022 Left 02/28/2022 Right 03/25/2022  Hip flexion 4/5 5/5     Hip extension         Hip abduction         Hip adduction         Hip internal rotation         Hip external rotation         Knee flexion 4/5 5/5     Knee extension 3+/5 5/5 5/5 37.9, 32.5 lbs 5/5 39.8, 33 lbs 5/5 38, 36 lbs  Ankle dorsiflexion 5/5 5/5     Ankle plantarflexion         Ankle inversion         Ankle eversion          (Blank rows = not tested)   FUNCTIONAL TESTS:  04/11/2022:   tandem stance eyes open 30 seconds s deviation bilateral.    Tandem stance eyes closed up to 10 seconds prior to deviations required assistance to correct - bilaterally.   04/01/2022: 6" eccentric step down - RLE stance has some knee wobbling and decreased eccentric control with increase in quad fatigue and pain, continues to require single UE support for stability  03/25/2022:   Rt SLS:  30 seconds  02/09/2022  18 inch chair transfer: able to perform s UE assist but most WB on Lt leg Lt SLS:             15 seconds     Rt SLS: 3 seconds   GAIT: 03/22/2022: independent ambulation in clinic time.   02/21/2022:  Use of SPC in Lt UE while in clinic c   supervision provided  02/09/2022  Distance walked: Household distances Assistive device utilized:  FWW Comments: maintained mid range knee flexion throughout, reduced stance on Rt c limited hip extension/terminal stance  phase.       TODAY'S TREATMENT: 04/11/2022: Therex:  UBE LE only lvl 4.0 6 mins   Leg extension machine  double leg up, Rt leg lowering slowly 3 x 15 5 lbs  Leg press Rt 3 x 15 62 lbs, 1-2 pause in end ranges  Education review of prone quad stretch c strap c strap over Lt shoulder  Neuro Re-ed  SLS on foam c corner touches of foam pad x 7 each corner bilateral c SBA  Tandem ambulation on foam in // bars 8 ft x 8 fwd/back each way  Tandem stance on floor eyes closed 30 sec x 4 bilateral  04/04/2022: Therex:  UBE LE only lvl 4.0 6 mins   Seated SLR 2 x 10 Lt slow lowering focus   TherActivity (to improve stair navigation, dance movement patterns)  Leg press Rt 2 x 15 62 lbs, 1-2 pause in end ranges  Lateral step down WB on Rt 2 x 15 on 6 inch step light one hand touch on bar  Dance with husband stepping within square of cones (movements fwd, lateral, diagonal fwd/back - x 5 each way Dance with husband hand held support hip twist c lateral stepping/rocker stepping 50 % speed x 8 (cues for home use) Sit to stand to sit no UE x 15 c slow lowering focus 17 inch table height   Neuro Re-ed  SLS on foam 30 sec x 4 bilateral occasional HHA on bars  Tandem ambulation on ground in // bars 10 ft x 5 fwd/back  Vasopneumatic 10 mins medium compression Rt knee in elevation, 34 degrees  04/01/2022: Therex:  UBE LE only lvl 4.0 10 mins   Leg extension machine  double leg up, Rt leg lowering slowly 2 x 15 5 lbs  Rt leg curl machine 10 lbs 2 x 15  TherActivity  Leg press Rt 2 x 15 62 lbs, 1-2 pause in end ranges  Lateral step down WB on Rt 1 x 15 on 6 inch step light one hand touch on bar  Forward step down WB on Rt 1 x 15 on 6 inch step light one hand touch on bar, increase in tightness sx with returning to step Hip twist step x 15 dance-related movement  Neuro Re-ed  Fitter wobble board fwd/back 30x each c SBA  SLS on foam 4 corner reaching Rt leg x 5  SLS on foam reach contralateral  leg fwd/lateral/back x 5 bilateral  [place] on foam beam x 4 bil. with fingertip support //bars or partner, standing grapevine no UE support 8' x 2  Vasopneumatic 10 mins medium compression Rt knee in elevation, 34 degrees     HOME EXERCISE PROGRAM: Access Code: [email or web address] URL: [email or web address] Date: 02/15/2022 Prepared by:    Exercises - Supine Heel Slide with Strap (Mirrored)  - 2-3 x daily - 7 x weekly - 1 sets - 10 reps - 5 hold - Seated Long Arc Quad  - 3-5 x daily - 7 x weekly - 1-2 sets - 10 reps - 2 hold - Seated Quad Set  - 3-5 x daily - 7 x weekly - 1 sets - 10 reps - 5 hold - Seated Passive Knee Extension with Weight  - 5 x daily - 7 x weekly - 1 sets - 1 reps -   to tolerance hold - Seated Knee Flexion AAROM  - 3-5 x daily - 7 x weekly - 1 sets - 1 reps - 3 minutes hold - [place]  - 3-5 x daily - 7 x weekly - 1 sets - 1-2 mins hold   ASSESSMENT:   CLINICAL IMPRESSION: Pt to continue to benefit from vision reduced balance interventions due to nature of dance environment to challenge vestibular and proprioceptive response.  Fair performance on level surface eyes closed activity today.     OBJECTIVE IMPAIRMENTS Abnormal gait, decreased activity tolerance, decreased balance, decreased coordination, decreased endurance, decreased mobility, difficulty walking, decreased ROM, decreased strength, hypomobility, increased edema, impaired perceived functional ability, impaired flexibility, improper body mechanics, and pain.    ACTIVITY LIMITATIONS carrying, lifting, bending, sitting, standing, squatting, sleeping, stairs, transfers, bed mobility, and locomotion level   PARTICIPATION LIMITATIONS: cleaning, laundry, driving, shopping, community activity, and yard work   PERSONAL FACTORS  Osteoporosis, HTN  are also affecting patient's functional outcome.    REHAB POTENTIAL: Good   CLINICAL DECISION MAKING: Stable/uncomplicated   EVALUATION  COMPLEXITY: Low     GOALS: Goals reviewed with patient? Yes   Short term PT Goals (target date for Short term goals are 3 weeks 03/02/2022) Patient will demonstrate independent use of home exercise program to maintain progress from in clinic treatments. Goal status: MET - assessed 02/23/2022   Long term PT goals (target dates for all long term goals are 10 weeks  04/20/2022 )   1. Patient will demonstrate/report pain at worst less than or equal to 2/10 to facilitate minimal limitation in daily activity secondary to pain symptoms. Goal status: on going - assessed 03/25/2022   2. Patient will demonstrate independent use of home exercise program to facilitate ability to maintain/progress functional gains from skilled physical therapy services. Goal status:  on going - assessed 03/25/2022   3. Patient will demonstrate FOTO outcome > or = 43 % to indicate reduced disability due to condition. Goal status:  MET - assessed 03/25/2022   4.  Patient will demonstrate Rt  knee AROM 0-110 degrees to facilitate ability to perform transfers, sitting, ambulation, stair navigation s restriction due to mobility. Goal status:  MET - assessed 03/25/2022   5.  Patient will demonstrate Rt LE MMT 5/5 throughout to facilitate ability to perform usual standing, walking, stairs at PLOF s limitation due to symptoms.   Goal status:  MET - assessed 03/25/2022   6.  Patient will demonstrate independent ambulation community distances > 300 ft to facilitate community integration at PLOF.   Goal status: Met - assessed 03/25/2022   7.  Patient will demonstrate ascending/descending stairs c reciprocal gait pattern without handrail assist for house entry.  a.  Goal Status: on going  - assessed 03/25/2022     PLAN: PT FREQUENCY: 2x-3x /week (3x week for first 1-2 weeks)   PT DURATION: 12 weeks   PLANNED INTERVENTIONS: Therapeutic exercises, Therapeutic activity, Neuro Muscular re-education, Balance training, Gait training,  Patient/Family education, Joint mobilization, Stair training, DME instructions, Dry Needling, Electrical stimulation, Cryotherapy, Moist heat, Taping, Ultrasound, Ionotophoresis 4mg/ml Dexamethasone, and Manual therapy.  All included unless contraindicated   PLAN FOR NEXT SESSION:   Recert for visit number and extension of POC through end of August.    , PT, DPT, OCS, ATC 04/11/22  2:25 PM  

## 2022-04-12 ENCOUNTER — Other Ambulatory Visit (HOSPITAL_BASED_OUTPATIENT_CLINIC_OR_DEPARTMENT_OTHER): Payer: Self-pay | Admitting: Obstetrics & Gynecology

## 2022-04-12 DIAGNOSIS — M810 Age-related osteoporosis without current pathological fracture: Secondary | ICD-10-CM

## 2022-04-12 DIAGNOSIS — D649 Anemia, unspecified: Secondary | ICD-10-CM | POA: Diagnosis not present

## 2022-04-12 NOTE — Telephone Encounter (Signed)
DOB verified. Advised pt that Dr. Sabra Heck reviewed the concerns she listed in her myChart message and recommends that she stop taking the Prolia and have a repeat bone density test ordered. Pt agreed. Pt to call and make appt for testing. Appt for injection cancelled per request.

## 2022-04-18 ENCOUNTER — Ambulatory Visit (INDEPENDENT_AMBULATORY_CARE_PROVIDER_SITE_OTHER): Payer: Medicare Other | Admitting: Rehabilitative and Restorative Service Providers"

## 2022-04-18 DIAGNOSIS — R262 Difficulty in walking, not elsewhere classified: Secondary | ICD-10-CM

## 2022-04-18 DIAGNOSIS — M25661 Stiffness of right knee, not elsewhere classified: Secondary | ICD-10-CM

## 2022-04-18 DIAGNOSIS — M6281 Muscle weakness (generalized): Secondary | ICD-10-CM | POA: Diagnosis not present

## 2022-04-18 DIAGNOSIS — R600 Localized edema: Secondary | ICD-10-CM | POA: Diagnosis not present

## 2022-04-18 DIAGNOSIS — G8929 Other chronic pain: Secondary | ICD-10-CM | POA: Diagnosis not present

## 2022-04-18 DIAGNOSIS — M25561 Pain in right knee: Secondary | ICD-10-CM | POA: Diagnosis not present

## 2022-04-18 NOTE — Therapy (Addendum)
OUTPATIENT PHYSICAL THERAPY TREATMENT NOTE  [place] Patient Name: Elaine Navarro MRN: [id number] DOB:1946/07/03, 76 y.o., female Today's Date: 04/18/2022  PCP: Vernie Shanks, MD REFERRING PROVIDER: Mcarthur Rossetti, MD  PHYSICAL THERAPY DISCHARGE SUMMARY  Visits from Start of Care: 21  Current functional level related to goals / functional outcomes: See note   Remaining deficits: See note   Education / Equipment: HEP   Patient agrees to discharge. Patient goals were met. Patient is being discharged due to meeting the stated rehab goals.   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   Hypertension    Left thyroid nodule 2003   S/P biopsy - benign   Lymphocytic colitis 03/2013   Migraines    Osteoporosis    PONV (postoperative nausea and vomiting)    Sciatica    Visceral injury    detached due to recent fall   Past Surgical History:  Procedure Laterality Date   ABDOMINOPLASTY  2010   [place] EXCISIONAL BIOPSY Bilateral 2001   x 3   CATARACT EXTRACTION, BILATERAL Bilateral 01/2020   Dental implants  1993   HEMORRHOID SURGERY  2000   KNEE ARTHROSCOPY W/ DEBRIDEMENT Right 07/2020   Dr. Berenice Primas   TOTAL KNEE ARTHROPLASTY Right 01/21/2022   Procedure: RIGHT TOTAL KNEE ARTHROPLASTY;  Surgeon: Mcarthur Rossetti, MD;  Location: [facility];  Service: Orthopedics;  Laterality: Right;  RFNA   VAGINAL HYSTERECTOMY  1974   Dysfunctional  uterine bleeding.   Patient Active Problem List   Diagnosis Date Noted   Status post right knee replacement 01/21/2022   Arthritis of right knee 01/20/2022   Lung nodules 12/28/2021   Primary hypertension 05/13/2021   Thyroid nodule 05/13/2021   History of depression 05/13/2021   H/O: hysterectomy 05/13/2021   Postmenopausal 05/13/2021   Piriformis syndrome of left side 04/07/2021   Chronic pain of right knee 03/04/2021   H/O sciatica 11/04/2020   Age-related osteoporosis without current pathological fracture 11/04/2020   Vitamin D insufficiency 12/20/2019   Actinic keratosis 03/21/2017   Focal lymphocytic colitis 05/29/2013   Aneurysm artery, renal ([place]) 09/28/2012   Headache, chronic migraine without aura, intractable 03/02/2012    REFERRING DIAG:  W09.811 (ICD-10-CM) - Status post right knee replacement  THERAPY DIAG:  Chronic pain of right knee  Muscle weakness (generalized)  Difficulty in walking, not elsewhere classified  Stiffness of right knee, not elsewhere classified  Localized edema  Rationale for Evaluation and Treatment Rehabilitation  PERTINENT HISTORY: Osteoporosis, HTN  PRECAUTIONS: None  SUBJECTIVE:  Indicated popping still can happen, maybe a bit more( advised follow up with MD if progressing in future).   PAIN:  NPRS scale: 0/10 upon arrival.  Pain location: Rt knee Pain description: stiff/swollen Aggravating factors: Prolonged walking/standing  Relieving factors: medicine, heat   OBJECTIVE:    PATIENT SURVEYS:  04/04/2022:  FOTO update : 61  03/07/2022:  FOTO update 55  02/09/2022 FOTO intake  21    predicted 43    COGNITION: 02/09/2022  Overall cognitive status: Within functional limits for tasks assessed                              EDEMA:  02/09/2022  Localized edema noted Rt knee/leg   PALPATION: 02/09/2022 Tenderness noted anterior knee joint bilateral, incision region, posterior knee/distal thigh Rt   LOWER EXTREMITY ROM:    Active ROM Right Eval 02/09/22 Left Eval 02/09/22 Right 02/10/22 Right 02/15/22 Right 02/21/22 Right 03/02/22 Right 03/09/2022 Right 03/25/2022 Right 04/18/2022  Hip flexion             Hip extension             Hip abduction             Hip adduction             Hip internal rotation             Hip external rotation             Knee flexion AROM in supine heel slide  67    AROM supine 71 AROM supine 84 AROM Supine 95 Seated P: 106* A: 98* AROM supine heel slide: 114  AROM supine heel slide 125 AROM supine heel slide 130  Knee extension -10 in seated LAQ AROM   -6 in supine heel prop PROM   AROM supine -5  AROM supine quad set - 0 degrees Seated P: -2* A: -6*  0 deg seated LAQ 0 deg seated LAQ  Ankle dorsiflexion             Ankle plantarflexion             Ankle inversion             Ankle eversion              (Blank rows = not tested)   LOWER EXTREMITY MMT:   MMT Right Eval 02/09/22 Left Eval 02/09/22 Right 02/28/2022 Left 02/28/2022 Right 03/25/2022 Right 04/18/2022  Hip flexion 4/5 5/5      Hip extension          Hip abduction          Hip adduction          Hip internal rotation          Hip external rotation          Knee flexion 4/5 5/5      Knee extension 3+/5 5/5 5/5 37.9, 32.5 lbs 5/5 39.8, 33 lbs 5/5 38, 36 lbs 5/5 39, 37.5 lbs  Ankle dorsiflexion 5/5 5/5      Ankle plantarflexion          Ankle inversion          Ankle eversion           (Blank rows = not tested)   FUNCTIONAL TESTS:  04/11/2022:   tandem stance eyes open 30 seconds s deviation bilateral.    Tandem stance eyes closed up to 10 seconds prior to deviations required assistance to correct - bilaterally.   04/01/2022: 6" eccentric step down - RLE stance has some knee wobbling and decreased eccentric control with increase in quad fatigue and pain, continues  to require single UE support for stability  03/25/2022:   Rt SLS:  30 seconds  02/09/2022  18 inch chair transfer: able to perform s UE  assist but most WB on Lt leg Lt SLS:             15 seconds     Rt SLS: 3 seconds   GAIT: 03/22/2022: independent ambulation in clinic time.   02/21/2022:  Use of SPC in Lt UE while in clinic c supervision provided  02/09/2022  Distance walked: Household distances Assistive device utilized:  [employer] Comments: maintained mid range knee flexion throughout, reduced stance on Rt c limited hip extension/terminal stance phase.       TODAY'S TREATMENT: 04/11/2022: Therex:  UBE LE only lvl 4.0 10 mins   Supine heel slide Rt x 2, overpressure by Pt 5 sec hold x 1  Leg extension machine  double leg up, Rt leg lowering slowly 3 x 10 10 lbs  Leg curl machine 15 lbs 2 x 10 Rt leg only  Tandem stance eyes closed 30 sec x 2 bilateral c SBA  Tandem ambulation fwd/back 15 ft x 5 each way c SBA    TherActivity  Flight of stairs up/down 14 steps no UE assist reciprocal gait pattern  Leg press Rt 3 x 15 62 lbs, 1-2 pause in end ranges  04/04/2022: Therex:  UBE LE only lvl 4.0 6 mins   Seated SLR 2 x 10 Lt slow lowering focus   TherActivity (to improve stair navigation, dance movement patterns)  Leg press Rt 2 x 15 62 lbs, 1-2 pause in end ranges  Lateral step down WB on Rt 2 x 15 on 6 inch step light one hand touch on bar  Dance with husband stepping within square of cones (movements fwd, lateral, diagonal fwd/back - x 5 each way Dance with husband hand held support hip twist c lateral stepping/rocker stepping 50 % speed x 8 (cues for home use) Sit to stand to sit no UE x 15 c slow lowering focus 17 inch table height   Neuro Re-ed  SLS on foam 30 sec x 4 bilateral occasional HHA on bars  Tandem ambulation on ground in // bars 10 ft x 5 fwd/back  Vasopneumatic 10 mins medium compression Rt knee in elevation, 34 degrees   HOME EXERCISE PROGRAM: Access Code: [email or web address] URL: [email or web address] Date: 02/15/2022 Prepared by: Scot Jun  Exercises - Supine Heel Slide with  Strap (Mirrored)  - 2-3 x daily - 7 x weekly - 1 sets - 10 reps - 5 hold - Seated Long Arc Quad  - 3-5 x daily - 7 x weekly - 1-2 sets - 10 reps - 2 hold - Seated Quad Set  - 3-5 x daily - 7 x weekly - 1 sets - 10 reps - 5 hold - Seated Passive Knee Extension with Weight  - 5 x daily - 7 x weekly - 1 sets - 1 reps - to tolerance hold - Seated Knee Flexion AAROM  - 3-5 x daily - 7 x weekly - 1 sets - 1 reps - 3 minutes hold - [place]  - 3-5 x daily - 7 x weekly - 1 sets - 1-2 mins hold   ASSESSMENT:   CLINICAL IMPRESSION: Pt has demonstrated good improvements c reduced symptoms, reduced functional limitations as noted today vs. Evaluation.  See objective data for updated information showing improvement to reach all established goals at this time.  After discussion c Pt.  Trial HEP indicated at this time due to overall progression.     OBJECTIVE IMPAIRMENTS Abnormal gait, decreased activity tolerance, decreased balance, decreased coordination, decreased endurance, decreased mobility, difficulty walking, decreased ROM, decreased strength, hypomobility, increased edema, impaired perceived functional ability, impaired flexibility, improper body mechanics, and pain.    ACTIVITY LIMITATIONS carrying, lifting, bending, sitting, standing, squatting, sleeping, stairs, transfers, bed mobility, and locomotion level   PARTICIPATION LIMITATIONS: cleaning, laundry, driving, shopping, community activity, and yard work   PERSONAL FACTORS  Osteoporosis, HTN  are also affecting patient's functional outcome.    REHAB POTENTIAL: Good   CLINICAL DECISION MAKING: Stable/uncomplicated   EVALUATION COMPLEXITY: Low     GOALS: Goals reviewed with patient? Yes   Short term PT Goals (target date for Short term goals are 3 weeks 03/02/2022) Patient will demonstrate independent use of home exercise program to maintain progress from in clinic treatments. Goal status: MET - assessed 02/23/2022   Long term PT  goals (target dates for all long term goals are 10 weeks  04/20/2022 )   1. Patient will demonstrate/report pain at worst less than or equal to 2/10 to facilitate minimal limitation in daily activity secondary to pain symptoms. Goal status: Met 04/18/2022   2. Patient will demonstrate independent use of home exercise program to facilitate ability to maintain/progress functional gains from skilled physical therapy services. Goal status:  Met 04/18/2022   3. Patient will demonstrate FOTO outcome > or = 43 % to indicate reduced disability due to condition. Goal status:  MET - assessed 03/25/2022   4.  Patient will demonstrate Rt  knee AROM 0-110 degrees to facilitate ability to perform transfers, sitting, ambulation, stair navigation s restriction due to mobility. Goal status:  MET - assessed 03/25/2022   5.  Patient will demonstrate Rt LE MMT 5/5 throughout to facilitate ability to perform usual standing, walking, stairs at PLOF s limitation due to symptoms.   Goal status:  MET - assessed 03/25/2022   6.  Patient will demonstrate independent ambulation community distances > 300 ft to facilitate community integration at [facility].   Goal status: Met - assessed 03/25/2022   7.  Patient will demonstrate ascending/descending stairs c reciprocal gait pattern without handrail assist for house entry.  a.  Goal Status: MET - 04/18/2022     PLAN: PT FREQUENCY: 2x-3x /week (3x week for first 1-2 weeks)   PT DURATION: 12 weeks   PLANNED INTERVENTIONS: Therapeutic exercises, Therapeutic activity, Neuro Muscular re-education, Balance training, Gait training, Patient/Family education, Joint mobilization, Stair training, DME instructions, Dry Needling, Electrical stimulation, Cryotherapy, Moist heat, Taping, Ultrasound, Ionotophoresis 41m/ml Dexamethasone, and Manual therapy.  All included unless contraindicated   PLAN FOR NEXT SESSION:   Trial HEP  MScot Jun PT, DPT, OCS, ATC 04/18/22  12:26 PM

## 2022-04-25 ENCOUNTER — Encounter: Payer: Medicare Other | Admitting: Rehabilitative and Restorative Service Providers"

## 2022-05-02 ENCOUNTER — Encounter: Payer: Medicare Other | Admitting: Rehabilitative and Restorative Service Providers"

## 2022-05-09 ENCOUNTER — Encounter: Payer: Medicare Other | Admitting: Rehabilitative and Restorative Service Providers"

## 2022-05-11 DIAGNOSIS — G894 Chronic pain syndrome: Secondary | ICD-10-CM | POA: Diagnosis not present

## 2022-05-11 DIAGNOSIS — F4321 Adjustment disorder with depressed mood: Secondary | ICD-10-CM | POA: Diagnosis not present

## 2022-05-11 DIAGNOSIS — M25561 Pain in right knee: Secondary | ICD-10-CM | POA: Diagnosis not present

## 2022-05-11 DIAGNOSIS — G5711 Meralgia paresthetica, right lower limb: Secondary | ICD-10-CM | POA: Diagnosis not present

## 2022-05-12 ENCOUNTER — Ambulatory Visit
Admission: RE | Admit: 2022-05-12 | Discharge: 2022-05-12 | Disposition: A | Discharge status: Home / Self Care | Payer: Medicare Other | Source: Ambulatory Visit | Attending: Internal Medicine | Admitting: Internal Medicine

## 2022-05-12 ENCOUNTER — Other Ambulatory Visit: Payer: Self-pay | Admitting: Internal Medicine

## 2022-05-12 DIAGNOSIS — E871 Hypo-osmolality and hyponatremia: Secondary | ICD-10-CM | POA: Diagnosis not present

## 2022-05-12 DIAGNOSIS — E042 Nontoxic multinodular goiter: Secondary | ICD-10-CM

## 2022-05-16 IMAGING — MG DIGITAL SCREENING BILAT W/ TOMO W/ CAD
8 series · 9 of 24 positions shown · non-contrast
Comparison: Previous exam(s).

CLINICAL DATA: Screening.

EXAM:
DIGITAL SCREENING BILATERAL MAMMOGRAM WITH TOMO AND CAD

[R MLO synth-2D]
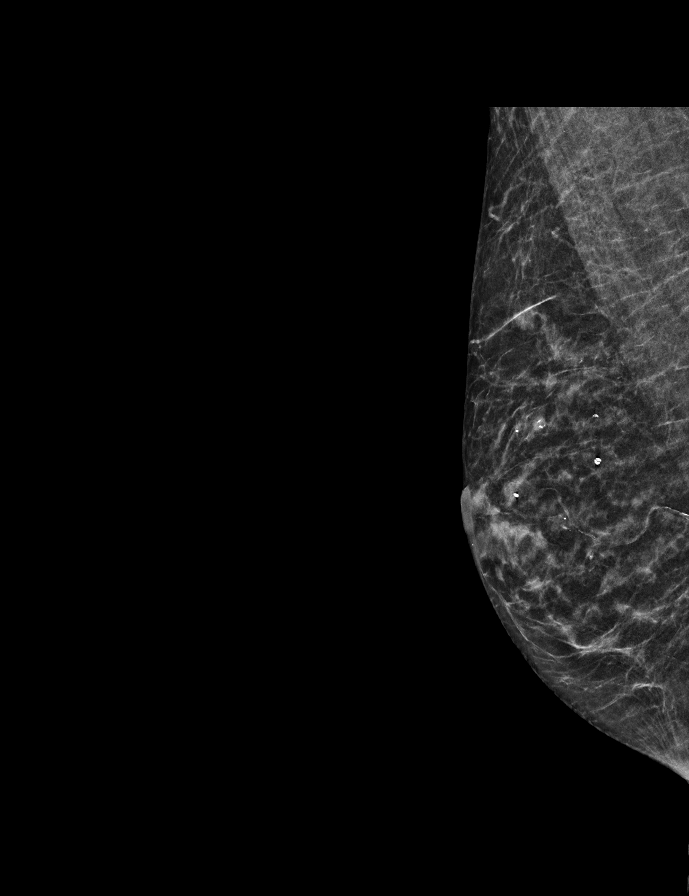

[L CC synth-2D]
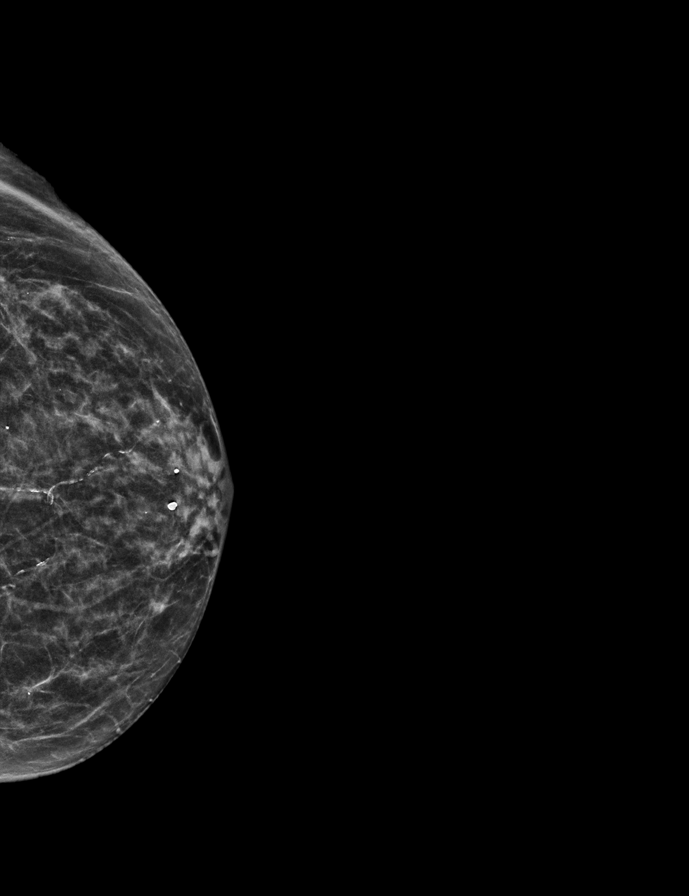

[R CC synth-2D]
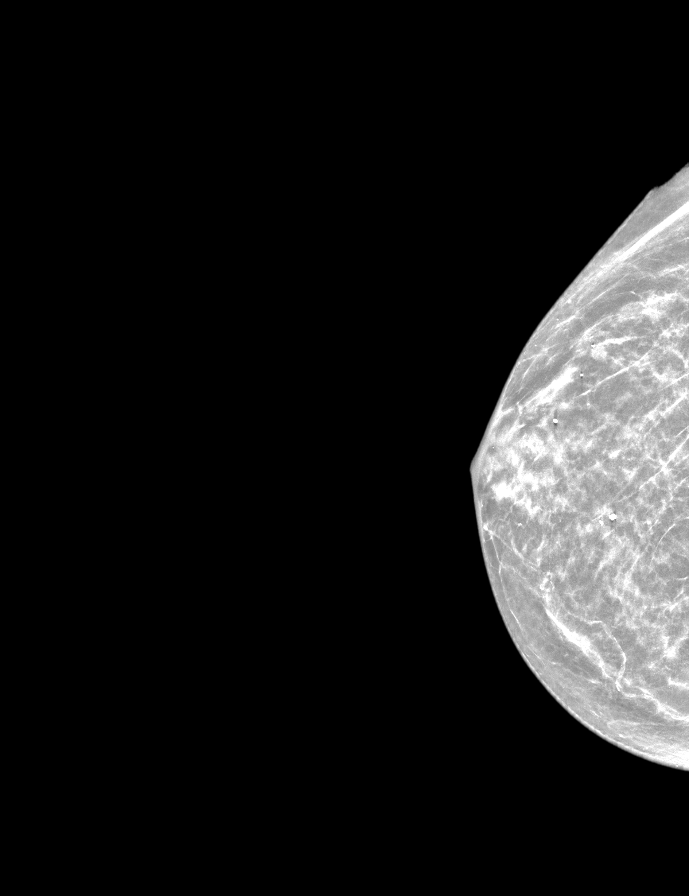

[L MLO synth-2D]
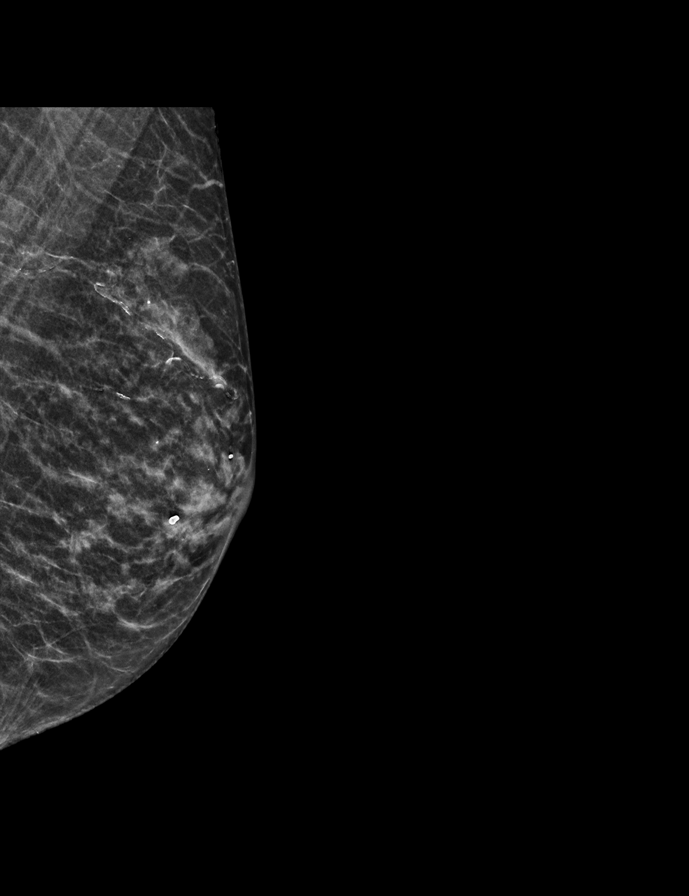

[R CC tomo · 2 of 43 frames shown]
[frame 14/43]
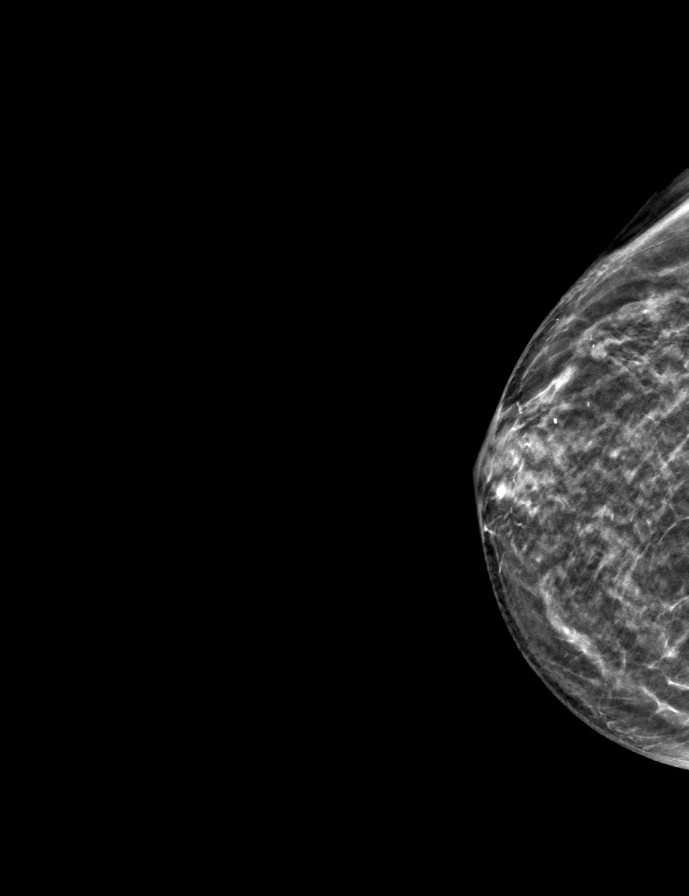
[frame 22/43]
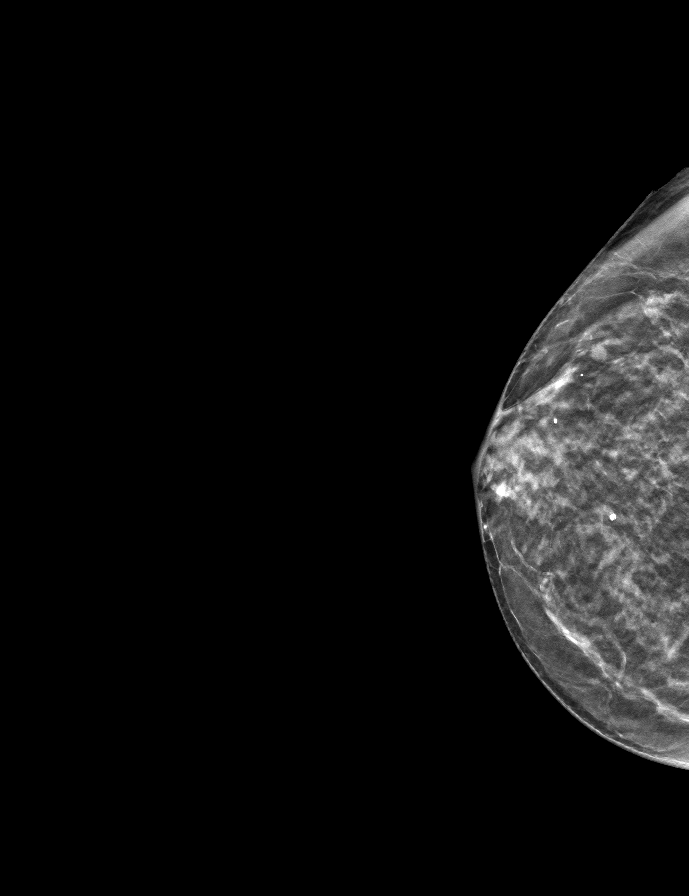

[L CC tomo · tomo slice 23/44.0]
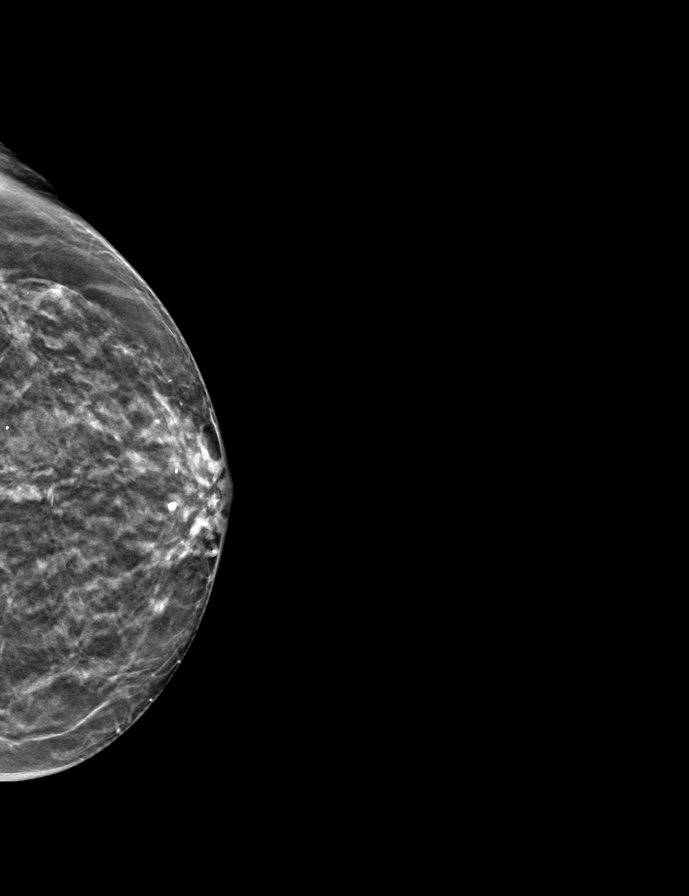

[L MLO tomo · tomo slice 21/41.0]
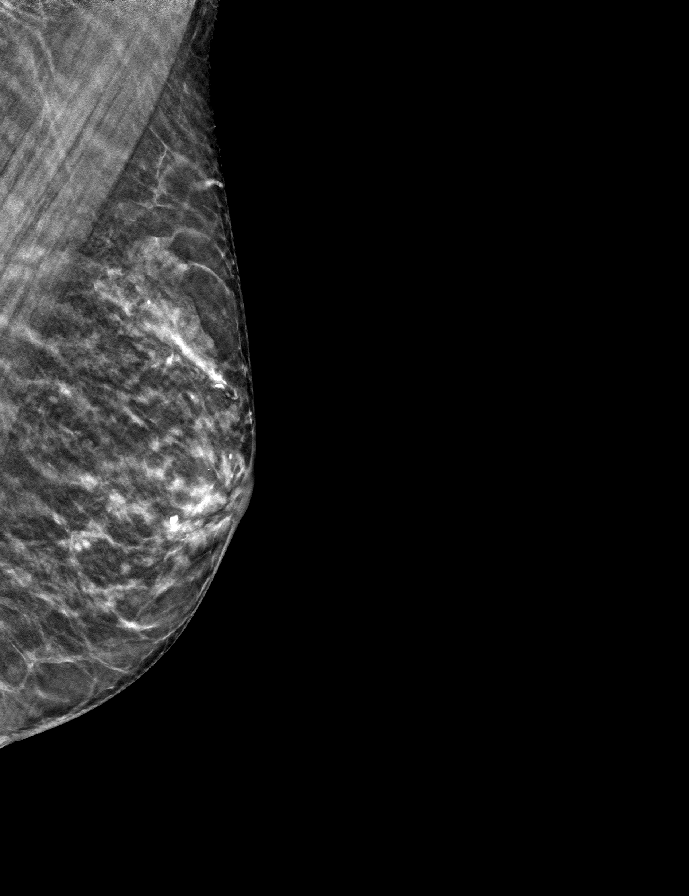

[R MLO tomo · tomo slice 22/43.0]
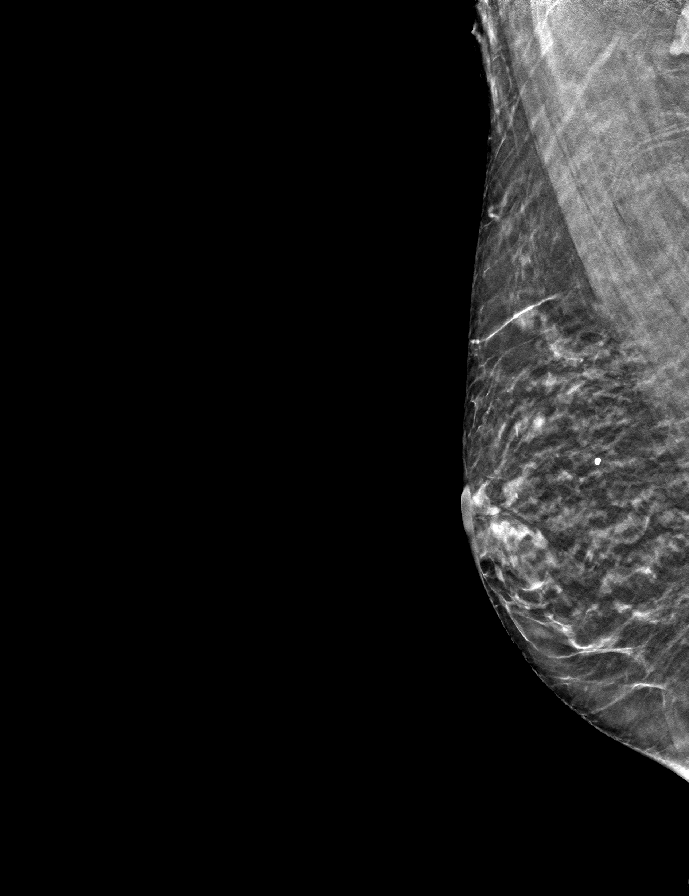

[9 of 24 positions shown; findings below may reference images not displayed]

ACR Breast Density Category c: The breast tissue is heterogeneously
dense, which may obscure small masses.
FINDINGS: There are no findings suspicious for malignancy. Images were
processed with CAD.
IMPRESSION: No mammographic evidence of malignancy. A result letter of this
screening mammogram will be mailed directly to the patient.

RECOMMENDATION:
Screening mammogram in one year. (Code:FT-U-LHB)

BI-RADS CATEGORY  1: Negative.

## 2022-05-17 ENCOUNTER — Encounter: Payer: Self-pay | Admitting: Orthopaedic Surgery

## 2022-05-19 ENCOUNTER — Ambulatory Visit (INDEPENDENT_AMBULATORY_CARE_PROVIDER_SITE_OTHER): Payer: Medicare Other | Admitting: Obstetrics & Gynecology

## 2022-05-19 ENCOUNTER — Other Ambulatory Visit (HOSPITAL_COMMUNITY)
Admission: RE | Admit: 2022-05-19 | Discharge: 2022-05-19 | Disposition: A | Discharge status: Home / Self Care | Payer: Medicare Other | Source: Ambulatory Visit | Attending: Obstetrics & Gynecology | Admitting: Obstetrics & Gynecology

## 2022-05-19 ENCOUNTER — Encounter (HOSPITAL_BASED_OUTPATIENT_CLINIC_OR_DEPARTMENT_OTHER): Payer: Self-pay | Admitting: Obstetrics & Gynecology

## 2022-05-19 VITALS — BP 149/67 | HR 71 | Ht 63.0 in | Wt 110.6 lb

## 2022-05-19 DIAGNOSIS — Z01419 Encounter for gynecological examination (general) (routine) without abnormal findings: Secondary | ICD-10-CM | POA: Diagnosis not present

## 2022-05-19 DIAGNOSIS — M810 Age-related osteoporosis without current pathological fracture: Secondary | ICD-10-CM

## 2022-05-19 DIAGNOSIS — F419 Anxiety disorder, unspecified: Secondary | ICD-10-CM | POA: Diagnosis not present

## 2022-05-19 DIAGNOSIS — Z8659 Personal history of other mental and behavioral disorders: Secondary | ICD-10-CM

## 2022-05-19 DIAGNOSIS — Z90710 Acquired absence of both cervix and uterus: Secondary | ICD-10-CM | POA: Diagnosis not present

## 2022-05-19 DIAGNOSIS — N898 Other specified noninflammatory disorders of vagina: Secondary | ICD-10-CM | POA: Insufficient documentation

## 2022-05-19 DIAGNOSIS — F32A Depression, unspecified: Secondary | ICD-10-CM | POA: Diagnosis not present

## 2022-05-19 DIAGNOSIS — Z780 Asymptomatic menopausal state: Secondary | ICD-10-CM

## 2022-05-19 MED ORDER — BUPROPION HCL ER (XL) 150 MG PO TB24: 150.0000 mg | ORAL_TABLET | Freq: Every day | ORAL | 4 refills | Status: DC

## 2022-05-19 NOTE — Progress Notes (Signed)
76 y.o. [id number] Married White or Caucasian female here for breast and pelvic exam.  I am also following her for osteoporosis.  On prolia.  She received 3 dosages but has decided to stop and will have repeat BMD in February.  Order will be placed.  Denies vaginal bleeding.  Has noted some vaginal odor.    Had knee replacement last year.  Had to have clearance so had CT of renal artery which was fine.  Nodule seen.  Saw pulmonologist.  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  Not on any treatment right now but will have follow up 1 year.  With surgery, BBB was noted.  Has seen cardiologist.    Patient's last menstrual period was 12/14/1972.          Sexually active: Yes.    H/O STD:  no  Health Maintenance: PCP:  Dr. Jacelyn Grip.  Last wellness appt was has appt tomorrow.  Blood work is planned. Vaccines are up to date:  yes Colonoscopy:  02/18/2020 at [facility] MMG:  03/21/5022 BMD:  planning in Feb Last pap smear:  hysterectomy.   H/o abnormal pap smear:  no hx    reports that she quit smoking about 39 years ago. Her smoking use included cigarettes. She has a 30.00 pack-year smoking history. She has never used smokeless tobacco. She reports current alcohol use of about 14.0 standard drinks of alcohol per week. She reports that she does not use drugs.  Past Medical History:  Diagnosis Date  . Aneurysm of renal artery (HCC)    followed at [facility], stable with CT/angiogram 10/2021  . Fracture of right wrist 2011   Fell in bathroom.  Marland Kitchen Hypertension   . Left thyroid nodule 2003   S/P biopsy - benign  . Lymphocytic colitis 03/2013  . Migraines   . Osteoporosis   . PONV (postoperative nausea and vomiting)   . Sciatica   . Visceral injury    detached due to recent fall    Past Surgical History:  Procedure Laterality Date  . ABDOMINOPLASTY  2010  . APPENDECTOMY  1962  . BREAST EXCISIONAL BIOPSY Bilateral 2001   x 3  . CATARACT EXTRACTION, BILATERAL Bilateral 01/2020  . Dental implants  1993  .  HEMORRHOID SURGERY  2000  . KNEE ARTHROSCOPY W/ DEBRIDEMENT Right 07/2020   Dr. Berenice Primas  . TOTAL KNEE ARTHROPLASTY Right 01/21/2022   Procedure: RIGHT TOTAL KNEE ARTHROPLASTY;  Surgeon: Mcarthur Rossetti, MD;  Location: [facility];  Service: Orthopedics;  Laterality: Right;  RFNA  . VAGINAL HYSTERECTOMY  1974   Dysfunctional uterine bleeding.    Current Outpatient Medications  Medication Sig Dispense Refill  . Acetylcysteine (NAC) 600 MG CAPS Take 600 mg by mouth daily.    Marland Kitchen amLODipine (NORVASC) 5 MG tablet Take 5 mg by mouth daily.    Marland Kitchen amoxicillin (AMOXIL) 500 MG tablet Take 1 tablet (500 mg total) by mouth 2 (two) times daily. 30 tablet 0  . Ascorbic Acid (VITAMIN C PO) Take 500 mg by mouth daily.    Marland Kitchen aspirin 81 MG chewable tablet Chew 1 tablet (81 mg total) by mouth 2 (two) times daily. 30 tablet 1  . budesonide (ENTOCORT EC) 3 MG 24 hr capsule Take 3 mg by mouth daily.    Marland Kitchen buPROPion (WELLBUTRIN XL) 150 MG 24 hr tablet Take 1 tablet (150 mg total) by mouth daily. 90 tablet 0  . calcium carbonate (OS-CAL) 600 MG TABS Take 600 mg by mouth daily with breakfast.    .  cholecalciferol (VITAMIN D3) 25 MCG (1000 UNIT) tablet Take 1,000 Units by mouth daily.    . diclofenac Sodium (VOLTAREN) 1 % GEL Apply 1 application. topically daily as needed (pain).    Marland Kitchen diphenhydrAMINE (BENADRYL) 25 mg capsule Take 25 mg by mouth daily as needed for allergies.    . finasteride (PROSCAR) 5 MG tablet Take 5 mg by mouth daily.    . fluticasone (FLONASE) 50 MCG/ACT nasal spray Place 1 spray into both nostrils daily as needed for allergies.  5  . gabapentin (NEURONTIN) 100 MG capsule Take 1 capsule (100 mg total) by mouth 3 (three) times daily as needed. 60 capsule 1  . losartan (COZAAR) 100 MG tablet Take 100 mg by mouth daily.    . magnesium oxide (MAG-OX) 400 MG tablet Take 400 mg by mouth daily.    . Melatonin 5 MG TABS Take 5 mg by mouth at bedtime as needed (sleep).    . Multiple Vitamin (MULTIVITAMIN)  tablet Take 1 tablet by mouth daily.    . Omega-3 1000 MG CAPS Take 1,000 mg by mouth daily.    Marland Kitchen omeprazole (PRILOSEC) 20 MG capsule Take 20 mg by mouth 2 (two) times daily before a meal.    . ondansetron (ZOFRAN) 4 MG tablet Take 1 tablet (4 mg total) by mouth every 8 (eight) hours as needed for nausea or vomiting. 20 tablet 0  . oxyCODONE (ROXICODONE) 5 MG immediate release tablet Take 1 tablet (5 mg total) by mouth every 4 (four) hours as needed for severe pain. 30 tablet 0  . sulfamethoxazole-trimethoprim (BACTRIM DS) 800-160 MG tablet Take 1 tablet by mouth every 12 (twelve) hours. 6 tablet 0  . SUMAtriptan (IMITREX) 50 MG tablet Take 50 mg by mouth every 2 (two) hours as needed for migraine.    . thiamine (VITAMIN B-1) 100 MG tablet Take 100 mg by mouth daily.    Marland Kitchen tiZANidine (ZANAFLEX) 4 MG tablet TAKE 1 TABLET(4 MG) BY MOUTH EVERY 6 HOURS AS NEEDED FOR MUSCLE SPASMS 30 tablet 0  . vitamin B-12 (CYANOCOBALAMIN) 1000 MCG tablet Take 1,000 mcg by mouth daily.     No current facility-administered medications for this visit.    Family History  Problem Relation Age of Onset  . Hypertension Mother   . Cancer Sister        melanoma  . Autoimmune disease Sister        crest and sjor  . Breast cancer Daughter 42  . Lupus Brother     Review of Systems  Exam:   LMP 12/14/1972      General appearance: alert, cooperative and appears stated age Breasts: {Exam; breast:13139::"normal appearance, no masses or tenderness"} Abdomen: soft, non-tender; bowel sounds normal; no masses,  no organomegaly Lymph nodes: Cervical, supraclavicular, and axillary nodes normal.  No abnormal inguinal nodes palpated Neurologic: Grossly normal  Pelvic: External genitalia:  no lesions              Urethra:  normal appearing urethra with no masses, tenderness or lesions              Bartholins and Skenes: normal                 Vagina: normal appearing vagina with atrophic changes ***and no discharge, no  lesions              Cervix: {exam; cervix:14595}              Pap taken: {yes  VJ:282060} Bimanual Exam:  Uterus:  {exam; uterus:12215}              Adnexa: {exam; adnexa:12223}               Rectovaginal: Confirms               Anus:  normal sphincter tone, no lesions  Chaperone, ***, CMA, was present for exam.  Assessment/Plan: There are no diagnoses linked to this encounter.

## 2022-05-20 DIAGNOSIS — Z0000 Encounter for general adult medical examination without abnormal findings: Secondary | ICD-10-CM | POA: Diagnosis not present

## 2022-05-20 DIAGNOSIS — L659 Nonscarring hair loss, unspecified: Secondary | ICD-10-CM | POA: Diagnosis not present

## 2022-05-20 DIAGNOSIS — I700 Atherosclerosis of aorta: Secondary | ICD-10-CM | POA: Diagnosis not present

## 2022-05-20 DIAGNOSIS — L089 Local infection of the skin and subcutaneous tissue, unspecified: Secondary | ICD-10-CM | POA: Diagnosis not present

## 2022-05-20 DIAGNOSIS — F419 Anxiety disorder, unspecified: Secondary | ICD-10-CM | POA: Diagnosis not present

## 2022-05-20 DIAGNOSIS — Z1331 Encounter for screening for depression: Secondary | ICD-10-CM | POA: Diagnosis not present

## 2022-05-20 DIAGNOSIS — M19042 Primary osteoarthritis, left hand: Secondary | ICD-10-CM | POA: Diagnosis not present

## 2022-05-20 DIAGNOSIS — I10 Essential (primary) hypertension: Secondary | ICD-10-CM | POA: Diagnosis not present

## 2022-05-20 DIAGNOSIS — K529 Noninfective gastroenteritis and colitis, unspecified: Secondary | ICD-10-CM | POA: Diagnosis not present

## 2022-05-20 DIAGNOSIS — M810 Age-related osteoporosis without current pathological fracture: Secondary | ICD-10-CM | POA: Diagnosis not present

## 2022-05-20 DIAGNOSIS — M19041 Primary osteoarthritis, right hand: Secondary | ICD-10-CM | POA: Diagnosis not present

## 2022-05-20 DIAGNOSIS — F5101 Primary insomnia: Secondary | ICD-10-CM | POA: Diagnosis not present

## 2022-05-20 DIAGNOSIS — E871 Hypo-osmolality and hyponatremia: Secondary | ICD-10-CM | POA: Diagnosis not present

## 2022-05-20 DIAGNOSIS — G43909 Migraine, unspecified, not intractable, without status migrainosus: Secondary | ICD-10-CM | POA: Diagnosis not present

## 2022-05-20 LAB — CERVICOVAGINAL ANCILLARY ONLY
Bacterial Vaginitis (gardnerella): NEGATIVE
Candida Glabrata: NEGATIVE
Candida Vaginitis: NEGATIVE
Comment: NEGATIVE
Comment: NEGATIVE
Comment: NEGATIVE

## 2022-05-29 DIAGNOSIS — Z23 Encounter for immunization: Secondary | ICD-10-CM | POA: Diagnosis not present

## 2022-06-01 DIAGNOSIS — Z23 Encounter for immunization: Secondary | ICD-10-CM | POA: Diagnosis not present

## 2022-06-07 ENCOUNTER — Ambulatory Visit (INDEPENDENT_AMBULATORY_CARE_PROVIDER_SITE_OTHER): Payer: Medicare Other | Admitting: Orthopaedic Surgery

## 2022-06-07 ENCOUNTER — Telehealth: Payer: Self-pay | Admitting: *Deleted

## 2022-06-07 ENCOUNTER — Encounter: Payer: Self-pay | Admitting: Orthopaedic Surgery

## 2022-06-07 ENCOUNTER — Ambulatory Visit (INDEPENDENT_AMBULATORY_CARE_PROVIDER_SITE_OTHER): Payer: Medicare Other

## 2022-06-07 DIAGNOSIS — Z96651 Presence of right artificial knee joint: Secondary | ICD-10-CM | POA: Diagnosis not present

## 2022-06-07 NOTE — Progress Notes (Signed)
The patient is now 4 months status post a right total knee arthroplasty.  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  She still feels clicking and grinding in her knee going up and down stairs and pain a lot of times at night.  She is not walking with assistive device.  She does have some left ankle pain along the course of the posterior tibial tendon.  Her right operative knee has full range of motion and is ligamentously stable.  I could not elicit grinding or catching in the knee today and there was not a lot of clicking.  I did tell her that that is very common after knee replacement surgery and it can take 6 months to a year to fully recover.  The biggest thing for her is to really work on quad training exercises and back off some of her activities if she is hurting too much.  2 views of the right knee show a total knee arthroplasty that is stable appearing and in good alignment with no complicating features.  The next imaging to see her is not for 6 months.  We will have a standing AP and lateral of her right knee at that visit.  The biggest thing I can do today is give her reassurance that she is doing everything correctly.  The things that she is experiencing are common after knee replacement surgery.

## 2022-06-07 NOTE — Telephone Encounter (Signed)
Ortho bundle 90 day in person meeting completed.  

## 2022-06-09 ENCOUNTER — Ambulatory Visit (HOSPITAL_BASED_OUTPATIENT_CLINIC_OR_DEPARTMENT_OTHER): Payer: Medicare Other

## 2022-06-13 DIAGNOSIS — G43719 Chronic migraine without aura, intractable, without status migrainosus: Secondary | ICD-10-CM | POA: Diagnosis not present

## 2022-06-17 DIAGNOSIS — I87391 Chronic venous hypertension (idiopathic) with other complications of right lower extremity: Secondary | ICD-10-CM | POA: Diagnosis not present

## 2022-06-21 DIAGNOSIS — A319 Mycobacterial infection, unspecified: Secondary | ICD-10-CM | POA: Diagnosis not present

## 2022-06-21 DIAGNOSIS — R053 Chronic cough: Secondary | ICD-10-CM | POA: Diagnosis not present

## 2022-06-21 DIAGNOSIS — Z79899 Other long term (current) drug therapy: Secondary | ICD-10-CM | POA: Diagnosis not present

## 2022-06-22 DIAGNOSIS — M25561 Pain in right knee: Secondary | ICD-10-CM | POA: Diagnosis not present

## 2022-06-22 DIAGNOSIS — M25552 Pain in left hip: Secondary | ICD-10-CM | POA: Diagnosis not present

## 2022-06-22 DIAGNOSIS — G894 Chronic pain syndrome: Secondary | ICD-10-CM | POA: Diagnosis not present

## 2022-06-22 DIAGNOSIS — F4321 Adjustment disorder with depressed mood: Secondary | ICD-10-CM | POA: Diagnosis not present

## 2022-06-27 ENCOUNTER — Encounter: Payer: Self-pay | Admitting: Internal Medicine

## 2022-06-28 ENCOUNTER — Other Ambulatory Visit: Payer: Self-pay | Admitting: Anesthesiology

## 2022-06-28 ENCOUNTER — Ambulatory Visit
Admission: RE | Admit: 2022-06-28 | Discharge: 2022-06-28 | Disposition: A | Discharge status: Home / Self Care | Payer: Medicare Other | Source: Ambulatory Visit | Attending: Anesthesiology | Admitting: Anesthesiology

## 2022-06-28 DIAGNOSIS — A310 Pulmonary mycobacterial infection: Secondary | ICD-10-CM | POA: Diagnosis not present

## 2022-06-28 DIAGNOSIS — M25552 Pain in left hip: Secondary | ICD-10-CM

## 2022-06-28 DIAGNOSIS — S0031XD Abrasion of nose, subsequent encounter: Secondary | ICD-10-CM | POA: Diagnosis not present

## 2022-06-28 DIAGNOSIS — B370 Candidal stomatitis: Secondary | ICD-10-CM | POA: Diagnosis not present

## 2022-06-28 DIAGNOSIS — M25551 Pain in right hip: Secondary | ICD-10-CM | POA: Diagnosis not present

## 2022-06-28 NOTE — Telephone Encounter (Signed)
It sounds like she is having really bad side effects from her MAC infection antibiotics.  She should call the doctor prescribing it and consider stopping it or adjusting the dose or taking a holiday  I called her 5:34 PM= chest congestion, sore throat, -> she could not get appt with [place]. She saw PCP DR Gemma Payor -> gave antibiotics last week Tuesday -> got white tongue . She took all  of it at once by error. Got sore throat and stomach burning.   SAw [facility] doc today -> and has been referred back to [place]. Till then take only take erythromycin. Stomach pain all gone. Some tender mouth still prsent -> rinse prescribe.    She has upcoing apt with Derl Barrow -> 07/04/22 and me in Dec 2023.   She is going out of country 06/2822  x 2 weeks.   Plan  - wait till she sees Beth 07/04/22 -  - can get covid test if unwell =- if really unwell go to ER    SIGNATURE    Dr. Brand Males, M.D., F.C.C.P,  Pulmonary and Critical Care Medicine Staff Physician, [place] Director - Interstitial Lung Disease  Program  Medical Director - [facility] [place] [facility] [place], [place], [address]   Pager: [phone], If no answer  -[place] or [place] [phone] Telephone (clinical office): [phone] Telephone (research): [phone]  5:39 PM 06/28/2022    Current Outpatient Medications:    Acetylcysteine (NAC) 600 MG CAPS, Take 600 mg by mouth daily., Disp: , Rfl:    amLODipine (NORVASC) 5 MG tablet, Take 5 mg by mouth daily., Disp: , Rfl:    Ascorbic Acid (VITAMIN C PO), Take 500 mg by mouth daily., Disp: , Rfl:    budesonide (ENTOCORT EC) 3 MG 24 hr capsule, Take 3 mg by mouth daily., Disp: , Rfl:    buPROPion (WELLBUTRIN XL) 150 MG 24 hr tablet, Take 1 tablet (150 mg total) by mouth daily., Disp: 90 tablet, Rfl: 4   calcium carbonate (OS-CAL) 600 MG TABS, Take 600 mg by mouth daily with breakfast., Disp: , Rfl:     cholecalciferol (VITAMIN D3) 25 MCG (1000 UNIT) tablet, Take 1,000 Units by mouth daily., Disp: , Rfl:    diclofenac Sodium (VOLTAREN) 1 % GEL, Apply 1 application. topically daily as needed (pain)., Disp: , Rfl:    diphenhydrAMINE (BENADRYL) 25 mg capsule, Take 25 mg by mouth daily as needed for allergies., Disp: , Rfl:    finasteride (PROSCAR) 5 MG tablet, Take 5 mg by mouth daily., Disp: , Rfl:    fluticasone (FLONASE) 50 MCG/ACT nasal spray, Place 1 spray into both nostrils daily as needed for allergies., Disp: , Rfl: 5   losartan (COZAAR) 100 MG tablet, Take 100 mg by mouth daily., Disp: , Rfl:    magnesium oxide (MAG-OX) 400 MG tablet, Take 400 mg by mouth daily., Disp: , Rfl:    Melatonin 5 MG TABS, Take 5 mg by mouth at bedtime as needed (sleep)., Disp: , Rfl:    Multiple Vitamin (MULTIVITAMIN) tablet, Take 1 tablet by mouth daily., Disp: , Rfl:    Omega-3 1000 MG CAPS, Take 1,000 mg by mouth daily., Disp: , Rfl:    omeprazole (PRILOSEC) 20 MG capsule, Take 20 mg by mouth 2 (two) times daily before a meal., Disp: , Rfl:    ondansetron (ZOFRAN) 4 MG tablet, Take  1 tablet (4 mg total) by mouth every 8 (eight) hours as needed for nausea or vomiting., Disp: 20 tablet, Rfl: 0   SUMAtriptan (IMITREX) 50 MG tablet, Take 50 mg by mouth every 2 (two) hours as needed for migraine., Disp: , Rfl:    thiamine (VITAMIN B-1) 100 MG tablet, Take 100 mg by mouth daily., Disp: , Rfl:    vitamin B-12 (CYANOCOBALAMIN) 1000 MCG tablet, Take 1,000 mcg by mouth daily., Disp: , Rfl:

## 2022-06-29 ENCOUNTER — Encounter: Payer: Self-pay | Admitting: Internal Medicine

## 2022-06-30 ENCOUNTER — Ambulatory Visit: Payer: Medicare Other | Admitting: Orthopaedic Surgery

## 2022-06-30 ENCOUNTER — Ambulatory Visit (INDEPENDENT_AMBULATORY_CARE_PROVIDER_SITE_OTHER): Payer: Medicare Other | Admitting: Primary Care

## 2022-06-30 ENCOUNTER — Ambulatory Visit (INDEPENDENT_AMBULATORY_CARE_PROVIDER_SITE_OTHER): Payer: Medicare Other

## 2022-06-30 ENCOUNTER — Encounter: Payer: Self-pay | Admitting: Primary Care

## 2022-06-30 VITALS — BP 140/70 | HR 75 | Temp 98.4°F | Ht 63.0 in | Wt 109.2 lb

## 2022-06-30 DIAGNOSIS — R918 Other nonspecific abnormal finding of lung field: Secondary | ICD-10-CM

## 2022-06-30 DIAGNOSIS — R911 Solitary pulmonary nodule: Secondary | ICD-10-CM

## 2022-06-30 DIAGNOSIS — R0602 Shortness of breath: Secondary | ICD-10-CM

## 2022-06-30 DIAGNOSIS — R9389 Abnormal findings on diagnostic imaging of other specified body structures: Secondary | ICD-10-CM | POA: Diagnosis not present

## 2022-06-30 DIAGNOSIS — J449 Chronic obstructive pulmonary disease, unspecified: Secondary | ICD-10-CM | POA: Diagnosis not present

## 2022-06-30 MED ORDER — ALBUTEROL SULFATE HFA 108 (90 BASE) MCG/ACT IN AERS: 2.0000 | INHALATION_SPRAY | Freq: Four times a day (QID) | RESPIRATORY_TRACT | 2 refills | Status: DC | PRN

## 2022-06-30 MED ORDER — AZITHROMYCIN 250 MG PO TABS: ORAL_TABLET | ORAL | 0 refills | Status: DC

## 2022-06-30 NOTE — Assessment & Plan Note (Addendum)
-    HRCT in April 2023 showed stable 0.4cm LLL lung nodule. Serial CT dating back to 2012 shows stability. Clusted nodularity right apnea and left upper lobe with some associated pleural parenchymal scarring.

## 2022-06-30 NOTE — Progress Notes (Signed)
_0  ID: Elaine Navarro, female    DOB: 01-May-1946, 76 y.o.   MRN: [id number]  Chief Complaint  Patient presents with   Follow-up    Sob-slight, , cough-thick, white occass. Yellow, ?wheezing. Primary MD rx'd Rifampin, Ethambutol, Azithromycin took 3 doses and stopped after talking to MR on phone 2 days ago    Referring provider: Charlane Ferretti, MD  HPI: 76 year old female, former smoker.  Past medical history significant for hypertension, pulmonary nodules.  Dr. Chase Caller.  06/30/2022 Patient presents today for acute office visit. She has a history of pulmonary nodules.  She had a CT in April 2023 that showed findings atypical mycobacterium. She has had a cough since August.  Primary care prescribed rifampin, ethambutol and azithromycin recently d/t her symptoms and image findings. She developed white tongue and sore throat after taking medication for 3 days. She did notice improvement in cough while on abx. She has had no sputum cultures. She has never had a bronchoscopy. No recent chest imaging. She plans to travel to [place] on 07/09/22. No significant shortness of breath except with coughing fits. Cough can be productive with white-yellow phlegm. She is afebrile.   Allergies  Allergen Reactions   Caine-1 [Lidocaine]     Allergic to Septocaine Articaine HCL 4% w/epi   Indomethacin Swelling   Articaine-Epinephrine Other (See Comments)   Gabapentin     "Twitches"   Hydrocodone Nausea Only   Hydrocodone-Acetaminophen Nausea Only   Latex     Possible allergy.   Lamisil [Terbinafine] Rash    Immunization History  Administered Date(s) Administered   Fluad Quad(high Dose 65+) 05/29/2022   Hepatitis A 03/10/2003, 10/15/2003   Hepatitis B 04/15/2003, 05/21/2003, 10/15/2003   Influenza Split 06/09/2014, 06/08/2015, 06/08/2017, 06/19/2018, 05/27/2019, 05/18/2020, 05/31/2021   Influenza, High Dose Seasonal PF 06/19/2018   Influenza,inj,Quad PF,6+ Mos 06/13/2011    Influenza-Unspecified 05/16/2013   PFIZER(Purple Top)SARS-COV-2 Vaccination 10/01/2019, 10/21/2019, 05/07/2020, 12/15/2020, 05/31/2021, 06/01/2022   Pneumococcal Conjugate-13 10/18/2013   Pneumococcal Polysaccharide-23 06/22/2010   Respiratory Syncytial Virus Vaccine,Recomb Aduvanted(Arexvy) 05/11/2022   Td 03/10/2003   Tdap 11/08/2010, 12/29/2019, 01/11/2020   Typhoid Inactivated 03/10/2003, 07/05/2013   Yellow Fever 07/05/2013   Zoster, Live 01/19/2006, 12/21/2016, 06/26/2017    Past Medical History:  Diagnosis Date   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   Hypertension    Left thyroid nodule 2003   S/P biopsy - benign   Lymphocytic colitis 03/2013   Migraines    Osteoporosis    PONV (postoperative nausea and vomiting)    Sciatica    Visceral injury    detached due to recent fall    Tobacco History: Social History   Tobacco Use  Smoking Status Former   Packs/day: 3.00   Years: 10.00   Total pack years: 30.00   Types: Cigarettes   Quit date: 12/15/1982   Years since quitting: 39.5  Smokeless Tobacco Never   Counseling given: Not Answered   Outpatient Medications Prior to Visit  Medication Sig Dispense Refill   Acetylcysteine (NAC) 600 MG CAPS Take 600 mg by mouth daily.     amLODipine (NORVASC) 5 MG tablet Take 5 mg by mouth daily.     Ascorbic Acid (VITAMIN C PO) Take 500 mg by mouth daily.     budesonide (ENTOCORT EC) 3 MG 24 hr capsule Take 3 mg by mouth daily.  buPROPion (WELLBUTRIN XL) 150 MG 24 hr tablet Take 1 tablet (150 mg total) by mouth daily. 90 tablet 4   calcium carbonate (OS-CAL) 600 MG TABS Take 600 mg by mouth daily with breakfast.     cholecalciferol (VITAMIN D3) 25 MCG (1000 UNIT) tablet Take 1,000 Units by mouth daily.     diclofenac Sodium (VOLTAREN) 1 % GEL Apply 1 application. topically daily as needed (pain).     diphenhydrAMINE (BENADRYL)  25 mg capsule Take 25 mg by mouth daily as needed for allergies.     finasteride (PROSCAR) 5 MG tablet Take 5 mg by mouth daily.     fluticasone (FLONASE) 50 MCG/ACT nasal spray Place 1 spray into both nostrils daily as needed for allergies.  5   losartan (COZAAR) 100 MG tablet Take 100 mg by mouth daily.     magnesium oxide (MAG-OX) 400 MG tablet Take 400 mg by mouth daily.     Melatonin 5 MG TABS Take 5 mg by mouth at bedtime as needed (sleep).     Multiple Vitamin (MULTIVITAMIN) tablet Take 1 tablet by mouth daily.     Omega-3 1000 MG CAPS Take 1,000 mg by mouth daily.     omeprazole (PRILOSEC) 20 MG capsule Take 20 mg by mouth 2 (two) times daily before a meal.     SUMAtriptan (IMITREX) 50 MG tablet Take 50 mg by mouth every 2 (two) hours as needed for migraine.     thiamine (VITAMIN B-1) 100 MG tablet Take 100 mg by mouth daily.     vitamin B-12 (CYANOCOBALAMIN) 1000 MCG tablet Take 1,000 mcg by mouth daily.     ondansetron (ZOFRAN) 4 MG tablet Take 1 tablet (4 mg total) by mouth every 8 (eight) hours as needed for nausea or vomiting. 20 tablet 0   No facility-administered medications prior to visit.    Review of Systems  Review of Systems  Constitutional: Negative.   HENT:  Positive for congestion.   Respiratory:  Positive for cough and shortness of breath.    Physical Exam  BP (!) 140/70 (BP Location: Right Arm, Cuff Size: Normal)   Pulse 75   Temp 98.4 F (36.9 C) (Temporal)   Ht _0  (1.6 m)   Wt 109 lb 3.2 oz (49.5 kg)   LMP 12/14/1972   SpO2 100%   BMI 19.34 kg/m  Physical Exam Constitutional:      Appearance: Normal appearance.     Comments: Thin frame  HENT:     Head: Normocephalic and atraumatic.  Cardiovascular:     Rate and Rhythm: Normal rate and regular rhythm.     Comments: RRR Pulmonary:     Effort: Pulmonary effort is normal. No respiratory distress.     Breath sounds: Normal breath sounds. No wheezing or rhonchi.  Musculoskeletal:         General: Normal range of motion.  Skin:    General: Skin is warm.  Neurological:     Mental Status: She is alert.  Psychiatric:        Mood and Affect: Mood normal.        Behavior: Behavior normal.        Thought Content: Thought content normal.      Lab Results:  CBC    Component Value Date/Time   WBC 6.5 01/22/2022 0719   RBC 3.04 (L) 01/22/2022 0719   HGB 10.0 (L) 01/22/2022 0719   HCT 29.8 (L) 01/22/2022 0719   PLT 191 01/22/2022 0719  MCV 98.0 01/22/2022 0719   MCH 32.9 01/22/2022 0719   MCHC 33.6 01/22/2022 0719   RDW 12.5 01/22/2022 0719   LYMPHSABS 2.7 11/24/2013 2220   MONOABS 0.5 11/24/2013 2220   EOSABS 0.1 11/24/2013 2220   BASOSABS 0.1 11/24/2013 2220    BMET    Component Value Date/Time   NA 133 (L) 01/22/2022 0719   K 3.5 01/22/2022 0719   CL 105 01/22/2022 0719   CO2 22 01/22/2022 0719   GLUCOSE 125 (H) 01/22/2022 0719   BUN 10 01/22/2022 0719   CREATININE 0.53 01/22/2022 0719   CALCIUM 6.9 (L) 01/22/2022 0719   GFRNONAA >60 01/22/2022 0719   GFRAA >90 11/24/2013 2220    BNP No results found for: "BNP"  ProBNP No results found for: "PROBNP"  Imaging: DG Chest 2 View  Result Date: 06/30/2022 CLINICAL DATA:  Cough 2 months.  Possible MAC EXAM: CHEST - 2 VIEW COMPARISON:  Chest 09/28/2021 FINDINGS: Pulmonary hyperinflation with changes of COPD. Interval development of mild airspace disease in the right upper lobe which could be due to acute pneumonia. Lung bases clear. No effusion Heart size and pulmonary vascularity normal. No acute skeletal abnormality. IMPRESSION: COPD with hyperinflation Interval development of right upper lobe infiltrate which could represent pneumonia. Possible MAC Electronically Signed   By: Franchot Gallo M.D.   On: 06/30/2022 11:41   DG HIPS BILAT WITH PELVIS 3-4 VIEWS  Result Date: 06/30/2022 CLINICAL DATA:  Bilateral hip pain, left greater than right. EXAM: DG HIP (WITH OR WITHOUT PELVIS) 3-4V BILAT COMPARISON:   None Available. FINDINGS: There is no evidence of hip fracture or dislocation. Mild narrow bilateral hip joint spaces are. Pelvic phleboliths noted. IMPRESSION: Mild degenerative joint changes of bilateral hips. Electronically Signed   By: Abelardo Diesel M.D.   On: 06/30/2022 10:51     Assessment & Plan:   Abnormal CT of the chest - Patient has had a cough since August. CT chest in April 2023 that showed findings suggestive of possible atypical infection such as Mycobacterium.  She has had no previous sputum cultures or bronchoscopy.  She was started on triple therapy with rifampin, azithromycin and ethambutol by PCP at [facility].  She reports cough symptoms did improve on antibiotics but she developed thrush and pharyngitis symptoms.  Patient stopped taking medication after 3 days. Recommend we get sputum cultures x3.  Repeat chest x-ray today.  We will send in Z-Pak for acute bronchitis symptoms.  Advise she take Mucinex DM 600 mg twice daily and use flutter valve 3 times daily.  We will also send in albuterol to use for breakthrough shortness of breath or wheezing.  She will likely need bronchoscopy at some point with Dr. Chase Caller when she returns from [place] for diagnosis.   Lung nodules -  HRCT in April 2023 showed stable 0.4cm LLL lung nodule. Serial CT dating back to 2012 shows stability. Clusted nodularity right apnea and left upper lobe with some associated pleural parenchymal scarring.   40 mins spent on case: > 50% face to face to patient   Martyn Ehrich, NP 06/30/2022

## 2022-06-30 NOTE — Assessment & Plan Note (Addendum)
-   Patient has had a cough since August. CT chest in April 2023 that showed findings suggestive of possible atypical infection such as Mycobacterium.  She has had no previous sputum cultures or bronchoscopy.  She was started on triple therapy with rifampin, azithromycin and ethambutol by PCP at [facility] family medicine.  She reports cough symptoms did improve on antibiotics but she developed thrush and pharyngitis symptoms.  Patient stopped taking medication after 3 days. Recommend we get sputum cultures x3.  Repeat chest x-ray today.  We will send in Z-Pak for acute bronchitis symptoms.  Advise she take Mucinex DM 600 mg twice daily and use flutter valve 3 times daily.  We will also send in albuterol to use for breakthrough shortness of breath or wheezing.  She will likely need bronchoscopy at some point with Dr. Chase Caller when she returns from [place] for diagnosis.

## 2022-06-30 NOTE — Patient Instructions (Addendum)
Recommendations - Start Azithromycin '500mg'$  x 1 day; then '250mg'$  daily x 4 days  - Stop rifampin, ethambutol - Continue Mucinex DM 600 mg twice daily - Use flutter valve 3 times daily - Use Albuterol 2 puffs every 4-6 hours for shortness of breath/wheezing  - Checking CXR today  - Checking sputum culture x3 if able, we may need to discuss bronchoscopy to rule in or out atypical infection once you get back from [place]  Follow-up: - December with Dr. Chase Caller or sooner if needed

## 2022-06-30 NOTE — Progress Notes (Signed)
Please let patient know that chest x-ray showed findings of right upper lobe infiltrate which could represent pneumonia/possible MAC.  We have already sent in a Z-Pak for her which she should take.  We will check sputum cultures to look for atypical infection. If this does not yield any definitive results we will discuss getting bronchoscopy.

## 2022-07-01 ENCOUNTER — Other Ambulatory Visit: Payer: Medicare Other

## 2022-07-01 ENCOUNTER — Other Ambulatory Visit (HOSPITAL_BASED_OUTPATIENT_CLINIC_OR_DEPARTMENT_OTHER): Payer: Self-pay | Admitting: Obstetrics & Gynecology

## 2022-07-01 DIAGNOSIS — R0602 Shortness of breath: Secondary | ICD-10-CM | POA: Diagnosis not present

## 2022-07-01 DIAGNOSIS — F419 Anxiety disorder, unspecified: Secondary | ICD-10-CM

## 2022-07-04 ENCOUNTER — Encounter: Payer: Self-pay | Admitting: *Deleted

## 2022-07-04 ENCOUNTER — Ambulatory Visit: Payer: Medicare Other | Admitting: Primary Care

## 2022-07-04 DIAGNOSIS — J159 Unspecified bacterial pneumonia: Secondary | ICD-10-CM | POA: Diagnosis not present

## 2022-07-04 DIAGNOSIS — J340 Abscess, furuncle and carbuncle of nose: Secondary | ICD-10-CM | POA: Diagnosis not present

## 2022-07-04 DIAGNOSIS — F5101 Primary insomnia: Secondary | ICD-10-CM | POA: Diagnosis not present

## 2022-07-04 DIAGNOSIS — A319 Mycobacterial infection, unspecified: Secondary | ICD-10-CM | POA: Diagnosis not present

## 2022-07-06 DIAGNOSIS — M25552 Pain in left hip: Secondary | ICD-10-CM | POA: Diagnosis not present

## 2022-08-01 DIAGNOSIS — H04123 Dry eye syndrome of bilateral lacrimal glands: Secondary | ICD-10-CM | POA: Diagnosis not present

## 2022-08-01 DIAGNOSIS — H02885 Meibomian gland dysfunction left lower eyelid: Secondary | ICD-10-CM | POA: Diagnosis not present

## 2022-08-01 DIAGNOSIS — H02882 Meibomian gland dysfunction right lower eyelid: Secondary | ICD-10-CM | POA: Diagnosis not present

## 2022-08-02 DIAGNOSIS — F4321 Adjustment disorder with depressed mood: Secondary | ICD-10-CM | POA: Diagnosis not present

## 2022-08-02 DIAGNOSIS — G894 Chronic pain syndrome: Secondary | ICD-10-CM | POA: Diagnosis not present

## 2022-08-02 DIAGNOSIS — M25552 Pain in left hip: Secondary | ICD-10-CM | POA: Diagnosis not present

## 2022-08-02 DIAGNOSIS — M47816 Spondylosis without myelopathy or radiculopathy, lumbar region: Secondary | ICD-10-CM | POA: Diagnosis not present

## 2022-08-02 LAB — FUNGUS CULTURE W SMEAR
MICRO NUMBER:: 14079666
SMEAR:: NONE SEEN
SPECIMEN QUALITY:: ADEQUATE

## 2022-08-02 LAB — RESPIRATORY CULTURE OR RESPIRATORY AND SPUTUM CULTURE
MICRO NUMBER:: 14079667
SPECIMEN QUALITY:: ADEQUATE

## 2022-08-03 DIAGNOSIS — G8929 Other chronic pain: Secondary | ICD-10-CM | POA: Diagnosis not present

## 2022-08-03 DIAGNOSIS — M810 Age-related osteoporosis without current pathological fracture: Secondary | ICD-10-CM | POA: Diagnosis not present

## 2022-08-03 DIAGNOSIS — I10 Essential (primary) hypertension: Secondary | ICD-10-CM | POA: Diagnosis not present

## 2022-08-03 DIAGNOSIS — G4700 Insomnia, unspecified: Secondary | ICD-10-CM | POA: Diagnosis not present

## 2022-08-12 DIAGNOSIS — M25552 Pain in left hip: Secondary | ICD-10-CM | POA: Diagnosis not present

## 2022-08-16 ENCOUNTER — Encounter: Payer: Self-pay | Admitting: Internal Medicine

## 2022-08-16 ENCOUNTER — Telehealth: Payer: Self-pay | Admitting: Internal Medicine

## 2022-08-16 ENCOUNTER — Ambulatory Visit (INDEPENDENT_AMBULATORY_CARE_PROVIDER_SITE_OTHER): Payer: Medicare Other | Admitting: Internal Medicine

## 2022-08-16 VITALS — BP 126/62 | HR 72 | Temp 98.0°F | Ht 63.0 in | Wt 113.0 lb

## 2022-08-16 DIAGNOSIS — R9389 Abnormal findings on diagnostic imaging of other specified body structures: Secondary | ICD-10-CM

## 2022-08-16 DIAGNOSIS — Z87891 Personal history of nicotine dependence: Secondary | ICD-10-CM

## 2022-08-16 DIAGNOSIS — R918 Other nonspecific abnormal finding of lung field: Secondary | ICD-10-CM

## 2022-08-16 DIAGNOSIS — R053 Chronic cough: Secondary | ICD-10-CM

## 2022-08-16 DIAGNOSIS — J42 Unspecified chronic bronchitis: Secondary | ICD-10-CM

## 2022-08-16 DIAGNOSIS — B963 Hemophilus influenzae [H. influenzae] as the cause of diseases classified elsewhere: Secondary | ICD-10-CM | POA: Diagnosis not present

## 2022-08-16 NOTE — Progress Notes (Addendum)
OV 08/18/2022   OV 11/09/2021  Subjective:  Patient ID: Elaine Navarro, female , DOB: July 19, 1946 , age 76 y.o. , MRN: [id number] , ADDRESS: [address] [place] [address] PCP Ileana Ladd, MD Patient Care Team: Ileana Ladd, MD as PCP - General (Family Medicine)  This Provider for this visit: Treatment Team:  Attending Provider: Kalman Shan, MD    11/09/2021 -   Chief Complaint  Patient presents with   Consult    Pt had a CT performed which is the reason for today's visit.     HPI Elaine Navarro 76 y.o. -presents with her husband.  Concern is left lower lobe nodule 4 mm seen on CT angiogram abdomen done for renal artery aneurysm.  She sees physician at [facility] for renal artery aneurysm.  During this time she had a CT scan of the abdomen done with contrast here in [place].  This showed a 4 mm lung nodule in the left lower lobe.  According to Dr. Dorothey Baseman it is stable over 10 years.  I personally visualized this.  Some bibasilar atelectasis also reported although I am not convinced about this.  Patient herself states that overall she is stable.  She says that in July 2022 she had COVID and after that she had significant cough.  Primary care treated with cough Tessalon Perles.  The cough is now resolved for the last 5 weeks.  Currently no undue shortness of breath.  No cough no wheezing no paroxysmal nocturnal dyspnea no orthopnea.  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  She is a remote 30 pack smoker having quit in 1984 when she moved from [place] to [place].  While in [place] as a child she got exposed to her both grandfather and aunt with tuberculosis.  She says she herself was skin test positive but denies any latent TB treatment.  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  But this was just general air pollution.  She is worried about the implications of all this for her health.  She says clinically her previous primary care physician told her she might have COPD.  She also is a brother with COPD.  She wants to be sure that there is no undue health issues with her lung.  She also wants to continue doing ballroom dancing at a championship level.    CT Chest data 10/21/21   Narrative & Impression  CLINICAL DATA:  Renal artery aneurysm   EXAM: CT ANGIOGRAPHY ABDOMEN   TECHNIQUE: Multidetector CT imaging of the abdomen was performed using the standard protocol during bolus administration of intravenous contrast. Multiplanar reconstructed images and MIPs were obtained and reviewed to evaluate the vascular anatomy.   RADIATION DOSE REDUCTION: This exam was performed according to the departmental dose-optimization program which includes automated exposure control, adjustment of the mA and/or kV according to patient size and/or use of iterative reconstruction technique.   CONTRAST:  75mL OMNIPAQUE IOHEXOL 350 MG/ML SOLN   COMPARISON:  CT abdomen dated August 13, 2012   FINDINGS: VASCULAR   Aorta: Normal caliber abdominal aorta with mild calcified plaque and no significant stenosis.   Celiac: Normal caliber celiac artery with no significant atherosclerotic disease or stenosis.   SMA: Visualized  SMA is normal in caliber with no significant atherosclerotic disease or stenosis.   Renals: Peripherally calcified renal artery aneurysm arising from a lower pole segmental branch measuring 1.1 x 1.1 x 1.3 cm, unchanged in size when compared to prior exam. Bilateral main renal arteries are normal in caliber with minimal calcified plaque and no stenosis.   IMA: Visualized portion is normal in caliber with no evidence of stenosis.   Veins: No venous abnormality.   Review of the MIP images confirms the above  findings.   NON-VASCULAR   Lower chest: Stable solid 4 mm nodule of the left lower lobe located on image 13, likely benign. Bibasilar atelectasis.   Hepatobiliary: No focal liver abnormality is seen. No gallstones, gallbladder wall thickening, or biliary dilatation.   Pancreas: Unremarkable. No pancreatic ductal dilatation or surrounding inflammatory changes.   Spleen: Normal in size without focal abnormality.   Adrenals/Urinary Tract: Adrenal glands are unremarkable. Kidneys are normal, without renal calculi, focal lesion, or hydronephrosis. Bladder is unremarkable.   Stomach/Bowel: Visualized portions of the small and large bowel are unremarkable.   Lymphatic: No pathologically enlarged lymph nodes seen in the abdomen.   Other: No abdominal wall hernia or abdominal ascites.   Musculoskeletal: No acute or significant osseous findings.   IMPRESSION: 1. 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 2.  Aortic Atherosclerosis (ICD10-I70.0).     Electronically Signed   By: Allegra Lai M.D.   On: 10/22/2021 10:03       12/28/2021 Follow up : Lung nodule  Patient returns for a 55-month follow-up.  Patient was seen for pulmonary consult November 09, 2021 for abnormal CT chest that showed 4 mm lung nodule.  Patient also has some mild shortness of breath with activities.  Has a family history of TB and COPD.  She is a former smoker.  Patient was set up for a high-resolution CT chest that showed no enlarged lymph nodes.  A 0.4 cm nodule in the left lower lobe that is stable compared back to 2012.  Consistent with a benign etiology.  Clustered nodularity and consolidation in the right apex with associated parenchymal scarring as well in the left upper lobe.  Findings are consistent with possible atypical infection such as atypical Mycobacterium.  Alpha-1 testing was normal level and phenotype.  Patient says she has very minimum cough if any.  Shortness of breath is  very mild in nature.  Patient was set up for pulmonary function testing that was normal that showed FEV1 at 125%, ratio 80, FVC 117%.,  No significant bronchodilator response, DLCO 82%. She says that she gets bronchitis once or twice a year.  Usually resolves pretty easily.  She says most days she has no cough at all.  Is not limited by activities.  She did have COVID-19 in July 2022. Patient does have colitis and has issues with weight loss at times.  Says occasionally she has some swallow issues.  She denies any hemoptysis, discolored mucus, fever.   IMPRESSION: 1. Small pulmonary nodules of the dependent left lower lobe are stable on examinations dating back to at least 09/15/2010 and definitively benign. No further follow-up or characterization is indicated for these nodules. 2. Clustered centrilobular nodularity and consolidation of the medial right pulmonary apex with some associated pleuroparenchymal scarring, as well as of the peripheral posterior left upper lobe. 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 These findings are new in comparison to remote prior CT of the chest  dated 09/15/2010.   Aortic Atherosclerosis (ICD10-I70.0).     Electronically Signed   By: Jearld Lesch M.D.   On: 12/17/2021 13:00     06/30/2022 Patient presents today for acute office visit. She has a history of pulmonary nodules.  She had a CT in April 2023 that showed findings atypical mycobacterium. She has had a cough since August.  Primary care prescribed rifampin, ethambutol and azithromycin recently d/t her symptoms and image findings. She developed white tongue and sore throat after taking medication for 3 days. She did notice improvement in cough while on abx. She has had no sputum cultures. She has never had a bronchoscopy. No recent chest imaging. She plans to travel to [place] on 07/09/22. No significant shortness of breath except with coughing fits.  Cough can be productive with white-yellow phlegm. She is afebrile.    Subjective:  Patient ID: Elaine Navarro, female , DOB: 07-Jan-1946 , age 29 y.o. , MRN: [id number] , ADDRESS: [address] [place] [address] PCP Thana Ates, MD Patient Care Team: Thana Ates, MD as PCP - General (Internal Medicine)  This Provider for this visit: Treatment Team:  Attending Provider: Kalman Shan, MD    08/18/2022 -   Chief Complaint  Patient presents with   Follow-up    Pt states she is still coughing and also has chest congestion as well as tightness in chest. States her cough is worse in the evening and states she is coughing up white phlegm.     HPI Elaine K Paschal 76 y.o. -returns for follow-up.  Presents with her husband.  I saw her earlier in the year in 2023.  After that she has had visits with the nurse practitioner.  She tells me that the cough that started in the summer 2022 after COVID still persist.  After I saw her I ordered CT scan of the chest that showed nodularity that was clustered.  Her primary care physician empirically prescribed a triple antibiotic therapy for MAI infection.  However she developed a coated tongue.  Also given the empiric nature of the diagnosis we recommended she stop it.  Which she did.  After that in October 2023 she saw a Publishing rights manager.  Sputum culture grew haemophilus influenza and Candida.  She was given Z-Pak.  She did leave for her trip to [place] on 07/09/2022 but the trip got changed because of the [place] war.  Instead she went to [place] [employer] and from there it was a [place] cruise from 07/11/2022.  She travel to the [place] of [place] in [place].  She went to [place] she went to [place] and she came back to [place] via [place] on 07/24/2022.  Shortly after arriving she got COVID-19 for the second time.  She believes she contracted this in the flight.  During the trip her cough is significantly  worse rated at 8 out of 9.  The Z-Pak that she was prescribed she took it at that time and it did help but currently she is back with residual cough at the level 5 out of 10.  Chest feels tight.  There is occasional white stuff.  Particularly sputum.  She is quite miserable with the symptoms.  She also has a runny nose for the last 5 weeks.  She wants her symptoms addressed.  Labs show normal QuantiFERON gold but no allergy workup. Alpha-1 antitrypsin is normal. She is a former 30 pack smoking history.   Of note for  this visit I personally visualized the CT scan with the previous CT scan from April 2023. CT Chest data  No results found.    PFT     Latest Ref Rng & Units 12/28/2021   10:42 AM  PFT Results  FVC-Pre L 3.18   FVC-Predicted Pre % 121   FVC-Post L 3.07   FVC-Predicted Post % 117   Pre FEV1/FVC % % 72   Post FEV1/FCV % % 80   FEV1-Pre L 2.29   FEV1-Predicted Pre % 117   FEV1-Post L 2.44   DLCO uncorrected ml/min/mmHg 15.17   DLCO UNC% % 82   DLCO corrected ml/min/mmHg 15.17   DLCO COR %Predicted % 82   DLVA Predicted % 72   TLC L 4.40   TLC % Predicted % 89   RV % Predicted % 64        has a past medical history of Aneurysm of renal artery (HCC), Fracture of right wrist (2011), Hypertension, Left thyroid nodule (2003), Lymphocytic colitis (03/2013), Migraines, Osteoporosis, PONV (postoperative nausea and vomiting), Sciatica, and Visceral injury.   reports that she quit smoking about 39 years ago. Her smoking use included cigarettes. She has a 30.00 pack-year smoking history. She has never used smokeless tobacco.  Past Surgical History:  Procedure Laterality Date   ABDOMINOPLASTY  2010   APPENDECTOMY  1962   BREAST EXCISIONAL BIOPSY Bilateral 2001   x 3   CATARACT EXTRACTION, BILATERAL Bilateral 01/2020   Dental implants  1993   HEMORRHOID SURGERY  2000   KNEE ARTHROSCOPY W/ DEBRIDEMENT Right 07/2020   Dr. Luiz Blare   TOTAL KNEE ARTHROPLASTY Right  01/21/2022   Procedure: RIGHT TOTAL KNEE ARTHROPLASTY;  Surgeon: Kathryne Hitch, MD;  Location: [facility];  Service: Orthopedics;  Laterality: Right;  RFNA   VAGINAL HYSTERECTOMY  1974   Dysfunctional uterine bleeding.    Allergies  Allergen Reactions   Caine-1 [Lidocaine]     Allergic to Septocaine Articaine HCL 4% w/epi   Indomethacin Swelling   Articaine-Epinephrine Other (See Comments)   Gabapentin     "Twitches"   Hydrocodone Nausea Only   Hydrocodone-Acetaminophen Nausea Only   Latex     Possible allergy.   Lamisil [Terbinafine] Rash    Immunization History  Administered Date(s) Administered   Fluad Quad(high Dose 65+) 05/29/2022   Hepatitis A 03/10/2003, 10/15/2003   Hepatitis B 04/15/2003, 05/21/2003, 10/15/2003   Influenza Split 06/09/2014, 06/08/2015, 06/08/2017, 06/19/2018, 05/27/2019, 05/18/2020, 05/31/2021   Influenza, High Dose Seasonal PF 06/19/2018   Influenza,inj,Quad PF,6+ Mos 06/13/2011   Influenza-Unspecified 05/16/2013   PFIZER(Purple Top)SARS-COV-2 Vaccination 10/01/2019, 10/21/2019, 05/07/2020, 12/15/2020, 05/31/2021, 06/01/2022   Pneumococcal Conjugate-13 10/18/2013   Pneumococcal Polysaccharide-23 06/22/2010   Respiratory Syncytial Virus Vaccine,Recomb Aduvanted(Arexvy) 05/11/2022   Td 03/10/2003   Tdap 11/08/2010, 12/29/2019, 01/11/2020   Typhoid Inactivated 03/10/2003, 07/05/2013   Yellow Fever 07/05/2013   Zoster, Live 01/19/2006, 12/21/2016, 06/26/2017    Family History  Problem Relation Age of Onset   Hypertension Mother    Cancer Sister        melanoma   Autoimmune disease Sister        crest and sjor   Breast cancer Daughter 46   Lupus Brother      Current Outpatient Medications:    Acetylcysteine (NAC) 600 MG CAPS, Take 600 mg by mouth daily., Disp: , Rfl:    albuterol (VENTOLIN HFA) 108 (90 Base) MCG/ACT inhaler, Inhale 2 puffs into the lungs every 6 (six) hours as needed  for wheezing or shortness of breath., Disp: 8 g,  Rfl: 2   amLODipine (NORVASC) 5 MG tablet, Take 5 mg by mouth daily., Disp: , Rfl:    Ascorbic Acid (VITAMIN C PO), Take 500 mg by mouth daily., Disp: , Rfl:    budesonide (ENTOCORT EC) 3 MG 24 hr capsule, Take 3 mg by mouth daily., Disp: , Rfl:    buPROPion (WELLBUTRIN XL) 150 MG 24 hr tablet, Take 1 tablet (150 mg total) by mouth daily., Disp: 90 tablet, Rfl: 4   calcium carbonate (OS-CAL) 600 MG TABS, Take 600 mg by mouth daily with breakfast., Disp: , Rfl:    cholecalciferol (VITAMIN D3) 25 MCG (1000 UNIT) tablet, Take 1,000 Units by mouth daily., Disp: , Rfl:    diclofenac Sodium (VOLTAREN) 1 % GEL, Apply 1 application. topically daily as needed (pain)., Disp: , Rfl:    diphenhydrAMINE (BENADRYL) 25 mg capsule, Take 25 mg by mouth daily as needed for allergies., Disp: , Rfl:    finasteride (PROSCAR) 5 MG tablet, Take 5 mg by mouth daily., Disp: , Rfl:    fluticasone (FLONASE) 50 MCG/ACT nasal spray, Place 1 spray into both nostrils daily as needed for allergies., Disp: , Rfl: 5   losartan (COZAAR) 100 MG tablet, Take 100 mg by mouth daily., Disp: , Rfl:    magnesium oxide (MAG-OX) 400 MG tablet, Take 400 mg by mouth daily., Disp: , Rfl:    Melatonin 5 MG TABS, Take 5 mg by mouth at bedtime as needed (sleep)., Disp: , Rfl:    Multiple Vitamin (MULTIVITAMIN) tablet, Take 1 tablet by mouth daily., Disp: , Rfl:    Omega-3 1000 MG CAPS, Take 1,000 mg by mouth daily., Disp: , Rfl:    omeprazole (PRILOSEC) 20 MG capsule, Take 20 mg by mouth 2 (two) times daily before a meal., Disp: , Rfl:    SUMAtriptan (IMITREX) 50 MG tablet, Take 50 mg by mouth every 2 (two) hours as needed for migraine., Disp: , Rfl:    thiamine (VITAMIN B-1) 100 MG tablet, Take 100 mg by mouth daily., Disp: , Rfl:    vitamin B-12 (CYANOCOBALAMIN) 1000 MCG tablet, Take 1,000 mcg by mouth daily., Disp: , Rfl:    Tiotropium Bromide Monohydrate (SPIRIVA RESPIMAT) 1.25 MCG/ACT AERS, Inhale 2 puffs into the lungs daily., Disp: 4 g,  Rfl: 5      Objective:   Vitals:   08/16/22 1603  BP: 126/62  Pulse: 72  Temp: 98 F (36.7 C)  TempSrc: Oral  SpO2: 98%  Weight: 113 lb (51.3 kg)  Height: 5\' 3"  (1.6 m)    Estimated body mass index is 20.02 kg/m as calculated from the following:   Height as of this encounter: 5\' 3"  (1.6 m).   Weight as of this encounter: 113 lb (51.3 kg).  @WEIGHTCHANGE @  [employer]   08/16/22 1603  Weight: 113 lb (51.3 kg)     Physical Exam    General: No distress. lean Neuro: Alert and Oriented x 3. GCS 15. Speech normal Psych: Pleasant Resp:  Barrel Chest - no.  Wheeze - no, Crackles - no, No overt respiratory distress CVS: Normal heart sounds. Murmurs - no Ext: Stigmata of Connective Tissue Disease - no HEENT: Normal upper airway. PEERL +. No post nasal drip        Assessment:       ICD-10-CM   1. Chronic cough  R05.3 CBC with Differential/Platelet    IgE    Resp Allergy  Profile Regn2DC DE MD [place] [facility]    ANA    Rheumatoid factor    Cyclic citrul peptide antibody, IgG    Anti-DNA antibody, double-stranded    Sjogren's syndrome antibods(ssa + ssb)    Sedimentation rate    IgG, IgA, IgM    2. Chronic bronchitis, unspecified chronic bronchitis type (HCC)  J42     3. Abnormal CT of the chest  R93.89     4. Lung nodules  R91.8     5. Hemophilus influenzae (H. influenzae) as the cause of diseases classified elsewhere  B96.3     6. Former heavy cigarette smoker (20-39 per day)  Z87.891      Concerned about colonization with H. influenzae or MAI.  This might all not just be MAI.  The colonization could be the reason for the clustered nodularity.  In addition we need to look at any immune factors contributing to this cough. Also meets definitin for chronic bronchitis s- will try sprivia      Plan:     Patient Instructions     ICD-10-CM   1. Chronic cough  R05.3     2. Abnormal CT of the chest  R93.89     3. Lung nodules  R91.8     4. Hemophilus influenzae  (H. influenzae) as the cause of diseases classified elsewhere  B96.3       You have chronic cough with nodularity of lungs You have history of sputum culture positive for haemophilus influenza  -Unclear if this is colonization versus acute infection  Plan - Check blood for CBC with differential blood IgE and RAST allergy panel - Check blood ANA, rheumatoid factor, CCP, double-stranded DNA, SSA, SSB  -Check ESR -Check blood IgG, IgA and IgM -Schedule bronchoscopy with lavage sometime in the next few to several weeks  -Wait several days to hear about this from our office -Meanwhile continue albuterol as needed and Mucinex  Follow-up - Return late January/early February 2024 for review of results  Addendum: After she left we will prescribe Spiriva and see if it helps her cough because she did not want to do inhaled steroids.  ( Level 05 visit: Estb 40-54 min n  visit type: on-site physical face to visit  in total care time and counseling or/and coordination of care by this undersigned MD - Dr Kalman Shan. This includes one or more of the following on this same day 08/18/2022: pre-charting, chart review, note writing, documentation discussion of test results, diagnostic or treatment recommendations, prognosis, risks and benefits of management options, instructions, education, compliance or risk-factor reduction. It excludes time spent by the CMA or office staff in the care of the patient. Actual time 45 min)   SIGNATURE    Dr. Kalman Shan, M.D., F.C.C.P,  Pulmonary and Critical Care Medicine Staff Physician, [facility] Director - Interstitial Lung Disease  Program  Pulmonary [facility] at [facility] [place], [place], [address]  Pager: [phone], If no answer or between  15:00h - 7:00h: call [phone] Telephone: [phone]  1:09 PM 08/18/2022

## 2022-08-16 NOTE — Telephone Encounter (Signed)
Elaine Navarro  Please tell the patient to stop fish oil.  This can indirectly worsen cough particularly if it is causing silent acid reflux.  Till workup is complete she should stop fish oil also she can try empiric Spiriva to see if this would help especially with a history of previous smoking and if there is any chronic bronchitis it could help her.  It is not a steroid so it is safe to try and will not affect bronchoscopy results.  This is an addendum after she left

## 2022-08-16 NOTE — Telephone Encounter (Signed)
PATIENT: Elaine Navarro GENDER: female MRN: [id number] DOB: December 30, 1945 ADDRESS: [place] [address]    Please schedule the following:  Diagnosis: chroonic cough, pulmnary odules Procedure: Video bronchocoopy, flexible bronchoscopy with BAL  / NO BIOPSY Envisia Classifer Transbronchial biopsy: NO  Anesthesia: moderate sedaion Do you need Fluro? NO Size of Scope: regular/small Pre-med nebulized lidocaine: yes Priority: 08/19/22 afternoon or 08/26/22 afternoon  Location: [facility] Does patient have OSA? no DM? no Or Latex allergy? YES Medication Restriction: npo exvept meds Anticoagulate/Antiplatelet: not on any Pre-op Labs Ordered:none Imaging request: no       MISCELLANEOUS KEY INSTRUCTIONS     Please coordinate Pre-op COVID Testing   Please let Dr Chase Caller know via reply phone message on Epic  Thank you     Key patient medical info     Allergy History:  Allergies  Allergen Reactions   Caine-1 [Lidocaine]     Allergic to Septocaine Articaine HCL 4% w/epi   Indomethacin Swelling   Articaine-Epinephrine Other (See Comments)   Gabapentin     "Twitches"   Hydrocodone Nausea Only   Hydrocodone-Acetaminophen Nausea Only   Latex     Possible allergy.   Lamisil [Terbinafine] Rash     Current Outpatient Medications:    Acetylcysteine (NAC) 600 MG CAPS, Take 600 mg by mouth daily., Disp: , Rfl:    albuterol (VENTOLIN HFA) 108 (90 Base) MCG/ACT inhaler, Inhale 2 puffs into the lungs every 6 (six) hours as needed for wheezing or shortness of breath., Disp: 8 g, Rfl: 2   amLODipine (NORVASC) 5 MG tablet, Take 5 mg by mouth daily., Disp: , Rfl:    Ascorbic Acid (VITAMIN C PO), Take 500 mg by mouth daily., Disp: , Rfl:    budesonide (ENTOCORT EC) 3 MG 24 hr capsule, Take 3 mg by mouth daily., Disp: , Rfl:    buPROPion (WELLBUTRIN XL) 150 MG 24 hr tablet, Take 1 tablet (150 mg total) by mouth daily., Disp: 90 tablet, Rfl: 4   calcium  carbonate (OS-CAL) 600 MG TABS, Take 600 mg by mouth daily with breakfast., Disp: , Rfl:    cholecalciferol (VITAMIN D3) 25 MCG (1000 UNIT) tablet, Take 1,000 Units by mouth daily., Disp: , Rfl:    diclofenac Sodium (VOLTAREN) 1 % GEL, Apply 1 application. topically daily as needed (pain)., Disp: , Rfl:    diphenhydrAMINE (BENADRYL) 25 mg capsule, Take 25 mg by mouth daily as needed for allergies., Disp: , Rfl:    finasteride (PROSCAR) 5 MG tablet, Take 5 mg by mouth daily., Disp: , Rfl:    fluticasone (FLONASE) 50 MCG/ACT nasal spray, Place 1 spray into both nostrils daily as needed for allergies., Disp: , Rfl: 5   losartan (COZAAR) 100 MG tablet, Take 100 mg by mouth daily., Disp: , Rfl:    magnesium oxide (MAG-OX) 400 MG tablet, Take 400 mg by mouth daily., Disp: , Rfl:    Melatonin 5 MG TABS, Take 5 mg by mouth at bedtime as needed (sleep)., Disp: , Rfl:    Multiple Vitamin (MULTIVITAMIN) tablet, Take 1 tablet by mouth daily., Disp: , Rfl:    Omega-3 1000 MG CAPS, Take 1,000 mg by mouth daily., Disp: , Rfl:    omeprazole (PRILOSEC) 20 MG capsule, Take 20 mg by mouth 2 (two) times daily before a meal., Disp: , Rfl:    SUMAtriptan (IMITREX) 50 MG tablet, Take 50 mg by mouth every 2 (two) hours as needed for migraine., Disp: ,  Rfl:    thiamine (VITAMIN B-1) 100 MG tablet, Take 100 mg by mouth daily., Disp: , Rfl:    vitamin B-12 (CYANOCOBALAMIN) 1000 MCG tablet, Take 1,000 mcg by mouth daily., Disp: , Rfl:    has a past medical history of Aneurysm of renal artery ([place]), Fracture of right wrist (2011), Hypertension, Left thyroid nodule (2003), Lymphocytic colitis (03/2013), Migraines, Osteoporosis, PONV (postoperative nausea and vomiting), Sciatica, and Visceral injury.    has a past surgical history that includes Vaginal hysterectomy (1974); Appendectomy (1962); Hemorrhoid surgery (2000); Dental implants (1993); Breast excisional biopsy (Bilateral, 2001); Cataract extraction, bilateral  (Bilateral, 01/2020); Knee arthroscopy w/ debridement (Right, 07/2020); Abdominoplasty (2010); and Total knee arthroplasty (Right, 01/21/2022).   SIGNATURE    Dr. Brand Males, M.D., F.C.C.P,  Pulmonary and Critical Care Medicine Staff Physician, [facility] Director - Interstitial Lung Disease  Program  Pulmonary [place] at [place], [place], [address]  Pager: [phone], If no answer or between  15:00h - 7:00h: call [phone] Telephone: [phone]  5:28 PM 08/16/2022

## 2022-08-16 NOTE — Patient Instructions (Addendum)
ICD-10-CM   1. Chronic cough  R05.3     2. Abnormal CT of the chest  R93.89     3. Lung nodules  R91.8     4. Hemophilus influenzae (H. influenzae) as the cause of diseases classified elsewhere  B96.3       You have chronic cough with nodularity of lungs You have history of sputum culture positive for haemophilus influenza  -Unclear if this is colonization versus acute infection  Plan - Check blood for CBC with differential blood IgE and RAST allergy panel - Check blood ANA, rheumatoid factor, CCP, double-stranded DNA, SSA, SSB  -Check ESR -Check blood IgG, IgA and IgM -Schedule bronchoscopy with lavage sometime in the next few to several weeks  -Wait several days to hear about this from our office -Meanwhile continue albuterol as needed and Mucinex  Follow-up - Return late January/early February 2024 for review of results  Addendum: After she left we will prescribe Spiriva and see if it helps her cough because she did not want to do inhaled steroids.

## 2022-08-17 ENCOUNTER — Telehealth: Payer: Self-pay

## 2022-08-17 DIAGNOSIS — R197 Diarrhea, unspecified: Secondary | ICD-10-CM | POA: Diagnosis not present

## 2022-08-17 DIAGNOSIS — K52832 Lymphocytic colitis: Secondary | ICD-10-CM | POA: Diagnosis not present

## 2022-08-17 MED ORDER — SPIRIVA RESPIMAT 1.25 MCG/ACT IN AERS: 2.0000 | INHALATION_SPRAY | Freq: Every day | RESPIRATORY_TRACT | 5 refills | Status: DC

## 2022-08-17 NOTE — Telephone Encounter (Signed)
Elaine Navarro at [facility] verified with her Elaine Navarro - there are no openings on Fri 12/8 or 12/15 for an outpatient procedure.  She also verified no availability at [facility] on those afternoons.  What other dates and times would you like for me to try?

## 2022-08-17 NOTE — Telephone Encounter (Signed)
Called and spoke with pt letting her know the info per MR. Verified preferred pharmacy and sent Rx for Spiriva to pharmacy for pt. Nothing further needed.

## 2022-08-17 NOTE — Telephone Encounter (Signed)
PA request received via CMM for Spiriva Respimat 1.25MCG/ACT aerosol through [employer].  PA has been submitted and is awaiting determination.   Key: [id number]

## 2022-08-17 NOTE — Telephone Encounter (Signed)
Elaine Navarro states inpatient only on 12/8 and 12/15 but may be able to work out something at [facility].  Going to check and call me back.

## 2022-08-17 NOTE — Telephone Encounter (Signed)
Lisa at [facility] to call me back.

## 2022-08-18 NOTE — Telephone Encounter (Signed)
Patient checking on the prior authorization for Spiriva Respimat. Patient phone number is [phone].

## 2022-08-18 NOTE — Telephone Encounter (Signed)
Given the chronic cough and previous history of smoking 30 pack/day I have updated the diagnosis to chronic bronchitis.  Please try to dispense Spiriva with the diagnosis chronic bronchitis.  Also we can give her a sample with 1 month for an empiric trial.

## 2022-08-18 NOTE — Telephone Encounter (Signed)
PA for Spiriva Respimat 1.2mg/act has been DENIED through [employer] due to:  Spiriva Aer 1.237m is not FDA approved for your medical condition: Chronic Cough. This condition is not supported by one of the accepted references. Therefore your drug is denied because it is not being used for a "medically accepted indication."

## 2022-08-18 NOTE — Telephone Encounter (Signed)
MR, please advise on this.

## 2022-08-19 ENCOUNTER — Other Ambulatory Visit (INDEPENDENT_AMBULATORY_CARE_PROVIDER_SITE_OTHER): Payer: Medicare Other

## 2022-08-19 DIAGNOSIS — R053 Chronic cough: Secondary | ICD-10-CM | POA: Diagnosis not present

## 2022-08-19 LAB — CBC WITH DIFFERENTIAL/PLATELET
Basophils Absolute: 0 10*3/uL (ref 0.0–0.1)
Basophils Relative: 1 % (ref 0.0–3.0)
Eosinophils Absolute: 0.1 10*3/uL (ref 0.0–0.7)
Eosinophils Relative: 1.7 % (ref 0.0–5.0)
HCT: 40.1 % (ref 36.0–46.0)
Hemoglobin: 13.7 g/dL (ref 12.0–15.0)
Lymphocytes Relative: 16.9 % (ref 12.0–46.0)
Lymphs Abs: 0.8 10*3/uL (ref 0.7–4.0)
MCHC: 34.2 g/dL (ref 30.0–36.0)
MCV: 94.8 fl (ref 78.0–100.0)
Monocytes Absolute: 0.3 10*3/uL (ref 0.1–1.0)
Monocytes Relative: 6.2 % (ref 3.0–12.0)
Neutro Abs: 3.7 10*3/uL (ref 1.4–7.7)
Neutrophils Relative %: 74.2 % (ref 43.0–77.0)
Platelets: 256 10*3/uL (ref 150.0–400.0)
RBC: 4.24 Mil/uL (ref 3.87–5.11)
RDW: 13.6 % (ref 11.5–15.5)
WBC: 5 10*3/uL (ref 4.0–10.5)

## 2022-08-19 LAB — SEDIMENTATION RATE: Sed Rate: 6 mm/hr (ref 0–30)

## 2022-08-19 NOTE — Telephone Encounter (Signed)
Can an appeal be done due to having a new diagnosis or would a different therapy have to be suggested?

## 2022-08-19 NOTE — Telephone Encounter (Signed)
PA automatically DENIED due to previous denial. Insurance will not allow me to input a new DX.

## 2022-08-22 LAB — RESPIRATORY ALLERGY PROFILE REGION II ~~LOC~~

## 2022-08-22 LAB — CYCLIC CITRUL PEPTIDE ANTIBODY, IGG: Cyclic Citrullin Peptide Ab: <16 UNITS

## 2022-08-22 LAB — INTERPRETATION:

## 2022-08-22 LAB — IGG, IGA, IGM
IgG (Immunoglobin G), Serum: 772 mg/dL (ref 600–1540)
IgM, Serum: 53 mg/dL (ref 50–300)
Immunoglobulin A: 150 mg/dL (ref 70–320)

## 2022-08-22 LAB — SJOGREN'S SYNDROME ANTIBODS(SSA + SSB)
SSA (Ro) (ENA) Antibody, IgG: <1 AI
SSB (La) (ENA) Antibody, IgG: <1 AI

## 2022-08-22 LAB — IGE: IgE (Immunoglobulin E), Serum: 7 kU/L (ref <=114)

## 2022-08-22 LAB — ANA: Anti Nuclear Antibody (ANA): NEGATIVE

## 2022-08-22 LAB — RHEUMATOID FACTOR: Rheumatoid fact SerPl-aCnc: <14 IU/mL (ref <14)

## 2022-08-22 LAB — ANTI-DNA ANTIBODY, DOUBLE-STRANDED: ds DNA Ab: <1 IU/mL

## 2022-08-24 DIAGNOSIS — M25552 Pain in left hip: Secondary | ICD-10-CM | POA: Diagnosis not present

## 2022-08-25 NOTE — Telephone Encounter (Signed)
January 5 Friday early morning at 7 AM [cannot be 7:30 AM] or in the afternoon  February 5 Monday early afternoon.  I do need to go to bed by 3 PM because I am on night shift later that night  February 9 Friday [although there is another bronc already that day

## 2022-08-26 ENCOUNTER — Ambulatory Visit (HOSPITAL_COMMUNITY): Admission: RE | Admit: 2022-08-26 | Payer: Medicare Other | Source: Home / Self Care | Admitting: Internal Medicine

## 2022-08-26 ENCOUNTER — Encounter (HOSPITAL_COMMUNITY): Admission: RE | Payer: Self-pay | Source: Home / Self Care

## 2022-08-26 ENCOUNTER — Encounter: Payer: Self-pay | Admitting: Internal Medicine

## 2022-08-26 SURGERY — VIDEO BRONCHOSCOPY WITHOUT FLUORO
Anesthesia: Moderate Sedation

## 2022-08-26 NOTE — Telephone Encounter (Signed)
Pt has been scheduled for 2/5 at 12:00 by Santiago Glad at [facility].  Pt will need to come to the office on 2/1 to get covid test.  I had to leave vm for pt to call me back for appt info.

## 2022-08-26 NOTE — Telephone Encounter (Signed)
Spoke to pt & gave her appointment info.  Send pt letter thru [place].

## 2022-08-30 ENCOUNTER — Other Ambulatory Visit: Payer: Self-pay | Admitting: *Deleted

## 2022-08-30 DIAGNOSIS — Z01818 Encounter for other preprocedural examination: Secondary | ICD-10-CM

## 2022-09-02 ENCOUNTER — Telehealth: Payer: Self-pay | Admitting: Internal Medicine

## 2022-09-02 DIAGNOSIS — R053 Chronic cough: Secondary | ICD-10-CM

## 2022-09-02 DIAGNOSIS — Z201 Contact with and (suspected) exposure to tuberculosis: Secondary | ICD-10-CM

## 2022-09-02 NOTE — Telephone Encounter (Signed)
Received call report from [place] on sputum AFB collected 07/01/22- + TB   Contains abnormal data AFB ID by DNA Probe Order: [id number] - Reflex for Order [id number] Status: Edited Result - FINAL     Visible to patient: Yes (not seen)     Next appt: 10/28/2022 at 03:00 PM in Radiology (GI-BCG DX DEXA 1)   Specimen Comment: Sputum  0 Result Notes    Component 2 mo ago  M tuberculosis complex Positive Abnormal   M avium complex Negative  M kansasii Not Indicated  M gordonae CANCELED  Comment: Test not performed  Result canceled by the ancillary.  Other: CANCELED  Comment: No additional identification testing is necessary.  Result canceled by the ancillary.  Resulting Agency [facility]         Narrative Performed by: Maryan Puls Performed at:  [facility] [address] [place], [place]  [id number] Lab Director: Rush Farmer MD, Phone:  [phone]    Specimen Collected: 07/01/22 11:48

## 2022-09-02 NOTE — Telephone Encounter (Signed)
Called and spoke with the pt  Notified of results- she is aware of lab needed next wk and to wear mask out  I placed order for lab  Referral to ID made and pt aware to expect a call from them  Called and spoke with Hailey in ENDO and notified of possible TB risk for Bronch on 10/17/22  She verbalized understanding- added note to appt notes I called the [facility] Dept and left msg with TB nurse [phone] - had to leave voicemail  Forwarding to myself for continued f/u

## 2022-09-02 NOTE — Telephone Encounter (Signed)
Mtb + from Oct 2023  Plan  - repeat Percival Spanish next week - needs to come in masked (was neg feb 2023)  - inform [facility] - refer ID clinic  - keep  my bronch appt for 10/17/22 - butbronc lab need to know potential TB patient

## 2022-09-03 LAB — AFB ID BY DNA PROBE
M avium complex: NEGATIVE
M tuberculosis complex: POSITIVE — AB

## 2022-09-03 LAB — AFB CULTURE WITH SMEAR (NOT AT ARMC)
Acid Fast Culture: POSITIVE — AB
Acid Fast Smear: NEGATIVE

## 2022-09-06 ENCOUNTER — Other Ambulatory Visit: Payer: Medicare Other

## 2022-09-06 DIAGNOSIS — Z201 Contact with and (suspected) exposure to tuberculosis: Secondary | ICD-10-CM

## 2022-09-06 DIAGNOSIS — R053 Chronic cough: Secondary | ICD-10-CM

## 2022-09-06 NOTE — Telephone Encounter (Signed)
Called HD and had to [facility] for Tammy.

## 2022-09-07 ENCOUNTER — Telehealth: Payer: Self-pay | Admitting: Primary Care

## 2022-09-07 DIAGNOSIS — R053 Chronic cough: Secondary | ICD-10-CM

## 2022-09-07 DIAGNOSIS — Z201 Contact with and (suspected) exposure to tuberculosis: Secondary | ICD-10-CM

## 2022-09-07 NOTE — Telephone Encounter (Signed)
AFB was positive. Patient is set up for a bronchoscopy in February with you, do you need to see her sooner or refer to ID??

## 2022-09-08 LAB — QUANTIFERON-TB GOLD PLUS
Mitogen-NIL: >10 IU/mL
NIL: 0.01 IU/mL
QuantiFERON-TB Gold Plus: NEGATIVE
TB1-NIL: 0.15 IU/mL
TB2-NIL: 0.11 IU/mL

## 2022-09-08 NOTE — Telephone Encounter (Signed)
I placed referral to ID

## 2022-09-08 NOTE — Telephone Encounter (Signed)
Called Elaine Navarro but she did not answer. Left message for her to call us back.   Also called the 24/7 on call number [phone] and left a message. Will fill out the online form on the HD's website as a backup option as well as faxed over a copy of her labs to [phone]  Will leave this encounter open for follow up.

## 2022-09-09 NOTE — Telephone Encounter (Signed)
I have the health dept on the phone w/positive test results on this pt for TB and they are wanting to speak w/ a nurse.Elaine Navarro

## 2022-09-09 NOTE — Telephone Encounter (Signed)
Received a call from Thornell Mule from the health department. I provided her with a brief summary of the patient. She verbalized understanding and stated that she would let the doctor at the health department know. I also confirmed that the TB lab test on 12/26 came back negative.   She verbalized understanding and is aware to call us if more information is needed.   Nothing further needed at time of call.

## 2022-09-10 ENCOUNTER — Encounter: Payer: Self-pay | Admitting: Internal Medicine

## 2022-09-13 NOTE — Telephone Encounter (Signed)
Called and spoke with Dr. Junius Creamer with [facility] Dept. Dr. Junius Creamer wanted to know why they had not been contacted about the results of pt's positive AFB DNA probe which came back positive for M TB Complex. This was ordered 10/20.  I stated to Dr. Junius Creamer that we found out the results of pt's positive result 12/22 and I did find a phone encounter where other coworkers of mine tried to call the Health Dept multiple times beginning 12/22 but had to leave messages for them to call back.  Dr. Junius Creamer requested to have pt's recent OV notes beginning with 10/19 as well as the sputum results, CT angio abdomen results, HRCT results, as well as quantiferon results faxed over to them in attn Kamiah Pabon to fax number [phone].   She also requested for Dr. Chase Caller to call her back. I made her aware that he was not back in the office  until 1/4. Routing encounter to Dr. Chase Caller.

## 2022-09-13 NOTE — Telephone Encounter (Signed)
Dr. Junius Creamer would like to speak to Dr. Chase Caller about patient's results. Dr. Junius Creamer phone number is [phone].

## 2022-09-15 ENCOUNTER — Encounter (HOSPITAL_COMMUNITY): Payer: Self-pay | Admitting: Internal Medicine

## 2022-09-15 ENCOUNTER — Other Ambulatory Visit: Payer: Self-pay

## 2022-09-15 NOTE — Telephone Encounter (Signed)
D/w Dr Junius Creamer of Health Dept: she is strongly recommending bronch BAL ASAP . The health dept has NOT been able to collect sputum. The patient prior to knowing about 07/01/22 Sputum AFB rsults was scheduled for bronch 10/17/22. This is obvisoly too far out  Plan  - please ask [place] to arrange for bronch BAL - for TB cuture and DNA probe from lavage  - I can do it at [facility] 09/16/22   - Health dept really wants the bronch done next few days so they can direct Rx ASAP  - I am working [place] 09/16/22 7a-7p  - Pleae replay ASAP

## 2022-09-15 NOTE — Telephone Encounter (Signed)
Pt has been rescheduled to tomorrow at 7:30 at [facility].  She will get her covid test when she gets there.  [place] is going to make Short Stay aware.  Spoke to pt & gave her new appt info.  Nothing further needed.

## 2022-09-15 NOTE — Telephone Encounter (Signed)
I will need the dot phrase/template put in for this pt so I will have info for scheduling.  Please provide this asap and I will call to try and get scheduled.

## 2022-09-15 NOTE — Telephone Encounter (Signed)
THanks  Please get a CT chest without contrast next week - last one was in April 2023

## 2022-09-15 NOTE — Telephone Encounter (Signed)
Hey ladies,  Please look at Dr Orvil Feil last message regarding this patient and the bronch procedure. Please let me know if I need to do anything  Thank you

## 2022-09-15 NOTE — Telephone Encounter (Signed)
Dr. Junius Creamer would like to speak to Dr. Chase Caller. Dr. Junius Creamer phone number is [phone].

## 2022-09-15 NOTE — Telephone Encounter (Signed)
Just realized this pt was already scheduled and needs to be moved up.  I will work on this.

## 2022-09-15 NOTE — Telephone Encounter (Signed)
  D/w Dr Junius Creamer of Health Dept: she is strongly recommending bronch BAL ASAP . The health dept has NOT been able to collect sputum. The patient prior to knowing about 07/01/22 Sputum AFB rsults was scheduled for bronch 10/17/22. This is obvisoly too far out   Plan  - please ask [place] to arrange for bronch BAL - for TB cuture and DNA probe from lavage  - I can do it at [facility] 09/16/22  - I amw orking at [facility] really wants the bronch done next few days so they can direct Rx ASAP   - I am working [place] 09/16/22 7a-7p   - Pleae replay ASAP

## 2022-09-15 NOTE — Telephone Encounter (Signed)
Dr Chase Caller,  Dr Junius Creamer is requesting that you call him sir  Their number is   [phone]  Please and thank you sir

## 2022-09-15 NOTE — Progress Notes (Signed)
PCP - Yaakov Guthrie, MD Cardiologist - Dorris Carnes, MD  PPM/ICD - denies  Chest x-ray - 06/30/22 EKG - 01/19/22 Stress Test - denies ECHO - denies Cardiac Cath - denies  CPAP - n/a  Fasting Blood Sugar - n/a  Blood Thinner Instructions: n/a Aspirin Instructions: Patient was instructed: As of today, STOP taking any Aspirin (unless otherwise instructed by your surgeon) Aleve, Naproxen, Ibuprofen, Motrin, Advil, Goody's, BC's, all herbal medications, fish oil, and all vitamins.  ERAS Protcol - n/a  COVID TEST- 09/16/22  Anesthesia review: yes  Patient verbally denies any shortness of breath, fever, cough and chest pain during phone call   -------------  SDW INSTRUCTIONS given:  Your procedure is scheduled on Friday, January 5th, 2024.  Report to [facility] Main Entrance "A" at 05:30 A.M., and check in at the Admitting office.  Call this number if you have problems the morning of surgery:  [phone]   Remember:  Do not eat or drink after midnight the night before your surgery    Take these medicines the morning of surgery with A SIP OF WATER;  Amlodipine, Bupropion, Proscar, Prilosec, Budesonide, inhalers PRN: Benadryl, Imitrex, Flonase  Please bring all inhalers with you the day of surgery.     The day of surgery:                     Do not wear jewelry, make up, or nail polish            Do not wear lotions, powders, perfumes, or deodorant.            Do not shave 48 hours prior to surgery.              Do not bring valuables to the hospital.            [facility] is not responsible for any belongings or valuables.  Do NOT Smoke (Tobacco/Vaping) 24 hours prior to your procedure If you use a CPAP at night, you may bring all equipment for your overnight stay.   Contacts, glasses, dentures or bridgework may not be worn into surgery.      For patients admitted to the hospital, discharge time will be determined by your treatment team.   Patients discharged the  day of surgery will not be allowed to drive home, and someone needs to stay with them for 24 hours.    Special instructions:   - Preparing For Surgery  Before surgery, you can play an important role. Because skin is not sterile, your skin needs to be as free of germs as possible. You can reduce the number of germs on your skin by washing with CHG (chlorahexidine gluconate) Soap before surgery.  CHG is an antiseptic cleaner which kills germs and bonds with the skin to continue killing germs even after washing.    Oral Hygiene is also important to reduce your risk of infection.  Remember - BRUSH YOUR TEETH THE MORNING OF SURGERY WITH YOUR REGULAR TOOTHPASTE  Please do not use if you have an allergy to CHG or antibacterial soaps. If your skin becomes reddened/irritated stop using the CHG.  Do not shave (including legs and underarms) for at least 48 hours prior to first CHG shower. It is OK to shave your face.  Please follow these instructions carefully.   Shower the NIGHT BEFORE SURGERY and the MORNING OF SURGERY with DIAL Soap.   Pat yourself dry with a CLEAN TOWEL.  Wear  CLEAN PAJAMAS to bed the night before surgery  Place CLEAN SHEETS on your bed the night of your first shower and DO NOT SLEEP WITH PETS.   Day of Surgery: Please shower morning of surgery  Wear Clean/Comfortable clothing the morning of surgery Do not apply any deodorants/lotions.   Remember to brush your teeth WITH YOUR REGULAR TOOTHPASTE.   Questions were answered. Patient verbalized understanding of instructions.

## 2022-09-16 ENCOUNTER — Encounter (HOSPITAL_COMMUNITY)
Admission: RE | Disposition: A | Discharge status: Home / Self Care | Payer: Self-pay | Source: Home / Self Care | Attending: Internal Medicine

## 2022-09-16 ENCOUNTER — Other Ambulatory Visit: Payer: Self-pay

## 2022-09-16 ENCOUNTER — Ambulatory Visit (HOSPITAL_COMMUNITY): Payer: Medicare Other

## 2022-09-16 ENCOUNTER — Ambulatory Visit (HOSPITAL_COMMUNITY): Payer: Medicare Other | Admitting: Physician Assistant

## 2022-09-16 ENCOUNTER — Encounter (HOSPITAL_COMMUNITY): Payer: Self-pay | Admitting: Internal Medicine

## 2022-09-16 ENCOUNTER — Ambulatory Visit (HOSPITAL_BASED_OUTPATIENT_CLINIC_OR_DEPARTMENT_OTHER): Payer: Medicare Other | Admitting: Physician Assistant

## 2022-09-16 ENCOUNTER — Telehealth: Payer: Self-pay | Admitting: Internal Medicine

## 2022-09-16 ENCOUNTER — Ambulatory Visit (HOSPITAL_COMMUNITY)
Admission: RE | Admit: 2022-09-16 | Discharge: 2022-09-16 | Disposition: A | Discharge status: Home / Self Care | Payer: Medicare Other | Attending: Internal Medicine | Admitting: Internal Medicine

## 2022-09-16 DIAGNOSIS — R053 Chronic cough: Secondary | ICD-10-CM | POA: Diagnosis not present

## 2022-09-16 DIAGNOSIS — Z01818 Encounter for other preprocedural examination: Secondary | ICD-10-CM | POA: Diagnosis not present

## 2022-09-16 DIAGNOSIS — Z87891 Personal history of nicotine dependence: Secondary | ICD-10-CM | POA: Insufficient documentation

## 2022-09-16 DIAGNOSIS — K219 Gastro-esophageal reflux disease without esophagitis: Secondary | ICD-10-CM | POA: Diagnosis not present

## 2022-09-16 DIAGNOSIS — R911 Solitary pulmonary nodule: Secondary | ICD-10-CM

## 2022-09-16 DIAGNOSIS — Z8616 Personal history of COVID-19: Secondary | ICD-10-CM | POA: Insufficient documentation

## 2022-09-16 DIAGNOSIS — R918 Other nonspecific abnormal finding of lung field: Secondary | ICD-10-CM | POA: Diagnosis not present

## 2022-09-16 DIAGNOSIS — I10 Essential (primary) hypertension: Secondary | ICD-10-CM | POA: Diagnosis not present

## 2022-09-16 DIAGNOSIS — Z8711 Personal history of peptic ulcer disease: Secondary | ICD-10-CM | POA: Insufficient documentation

## 2022-09-16 DIAGNOSIS — Z1152 Encounter for screening for COVID-19: Secondary | ICD-10-CM | POA: Insufficient documentation

## 2022-09-16 HISTORY — PX: VIDEO BRONCHOSCOPY: SHX5072

## 2022-09-16 HISTORY — DX: Left bundle-branch block, unspecified: I44.7

## 2022-09-16 HISTORY — PX: BRONCHIAL WASHINGS: SHX5105

## 2022-09-16 HISTORY — DX: Gastro-esophageal reflux disease without esophagitis: K21.9

## 2022-09-16 LAB — BODY FLUID CELL COUNT WITH DIFFERENTIAL
Eos, Fluid: 3 %
Lymphs, Fluid: 14 %
Monocyte-Macrophage-Serous Fluid: 21 % — ABNORMAL LOW (ref 50–90)
Neutrophil Count, Fluid: 62 % — ABNORMAL HIGH (ref 0–25)
Total Nucleated Cell Count, Fluid: 42 cu mm (ref 0–1000)

## 2022-09-16 LAB — BASIC METABOLIC PANEL
Anion gap: 8 (ref 5–15)
BUN: 9 mg/dL (ref 8–23)
CO2: 24 mmol/L (ref 22–32)
Calcium: 8.9 mg/dL (ref 8.9–10.3)
Chloride: 101 mmol/L (ref 98–111)
Creatinine, Ser: 0.69 mg/dL (ref 0.44–1.00)
GFR, Estimated: >60 mL/min (ref >60)
Glucose, Bld: 93 mg/dL (ref 70–99)
Potassium: 3.5 mmol/L (ref 3.5–5.1)
Sodium: 133 mmol/L — ABNORMAL LOW (ref 135–145)

## 2022-09-16 LAB — SARS CORONAVIRUS 2 BY RT PCR: SARS Coronavirus 2 by RT PCR: NEGATIVE

## 2022-09-16 SURGERY — VIDEO BRONCHOSCOPY WITHOUT FLUORO
Anesthesia: General

## 2022-09-16 MED ORDER — SUGAMMADEX SODIUM 200 MG/2ML IV SOLN
INTRAVENOUS | Status: DC | PRN
  Administered 2022-09-16: 300 mg via INTRAVENOUS

## 2022-09-16 MED ORDER — PROPOFOL 500 MG/50ML IV EMUL
INTRAVENOUS | Status: DC | PRN
  Administered 2022-09-16: 125 ug/kg/min via INTRAVENOUS

## 2022-09-16 MED ORDER — CHLORHEXIDINE GLUCONATE 0.12 % MT SOLN
15.0000 mL | Freq: Once | OROMUCOSAL | Status: AC
  Administered 2022-09-16: 15 mL via OROMUCOSAL
  Filled 2022-09-16 (×2): qty 15

## 2022-09-16 MED ORDER — LACTATED RINGERS IV SOLN: INTRAVENOUS | Status: DC

## 2022-09-16 MED ORDER — EPHEDRINE SULFATE-NACL 50-0.9 MG/10ML-% IV SOSY
PREFILLED_SYRINGE | INTRAVENOUS | Status: DC | PRN
  Administered 2022-09-16: 4 mg via INTRAVENOUS

## 2022-09-16 MED ORDER — ROCURONIUM BROMIDE 10 MG/ML (PF) SYRINGE
PREFILLED_SYRINGE | INTRAVENOUS | Status: DC | PRN
  Administered 2022-09-16: 50 mg via INTRAVENOUS

## 2022-09-16 MED ORDER — PROPOFOL 10 MG/ML IV BOLUS
INTRAVENOUS | Status: DC | PRN
  Administered 2022-09-16: 90 mg via INTRAVENOUS

## 2022-09-16 NOTE — Op Note (Signed)
Name:  [name] MRN:  [id number] DOB:  [birth year]  PROCEDURE NOTE  Procedure(s): Flexible bronchoscopy [phone]) Bronchial alveolar lavage [phone]) of the Right Upper Lobe  Indications:  Chronic cough. RUl infiltrate, rule out TB  Consent:  Procedure, benefits, risks and alternatives discussed.  Questions answered.  Consent obtained.  Anesthesia:  General anesthesia  Location: [facility]  Procedure summary:  Appropriate equipment was assembled.  The patient was brought to the procedure suite room and identified as Elaine Navarro with 1946/08/09  Safety timeout was performed. The patient was placed supine on the  table, airway and GA was administed by anesthesia  After the appropriate level of GAwas assured, flexible video bronchoscope was lubricated and inserted through the ET  Tube.   Airway examination was yes performed bilaterally to subsegmental level.  Minimal clear secretions were noted, mucosa appeared normal and NO endobronchial lesions were identified.  Bronchial alveolar lavage of the RIGHT UPPER LOBE was performed with 20 mL of normal saline  x 1 number of times and returned to cannister Then 34m x 3  for total volume of 120 mL. Total return of 30-35 mL of clear grey fluid returned to LEUKENS TRAP. After which the bronchoscope was withdrawn.   After hemostasis was assure, the bronchoscope was withdrawn.  The patient was recovered and then  transferred to recovery area  Post-procedure chest x-ray was NOT ordered.  Specimens sent: Bronchial alveolar lavage specimen of the RIGHT UPPER LOBE for cell count ( microbiology and cytology, AFB DNA PROBE.  Complications:  No immediate complications were noted.  Hemodynamic parameters and oxygenation remained stable throughout the procedure.  Estimated blood loss:  NONE  IMPRESSION 1. Normal airway exam 2. RIght upper lobe BAL - rule out MTb v MAI   Followup  AWait results -  Get opd CT chest  next week  Future Appointments  Date Time Provider [place] 09/21/2022 10:00 AM WMignon Pine DO [facility]  10/28/2022  3:00 PM GI-BCG DX DEXA 1 GI-BCGDG GI-BREAST CE  12/06/2022  9:15 AM BMcarthur Rossetti MD [facility] None     Dr. MBrand Males M.D., FHavas[facility] CenterC.P Pulmonary and Critical Care Medicine Staff Physician CPheasant RunPulmonary and Critical Care Pager: [phone] If no answer or between  15:00h - 7:00h: call [phone]  09/16/2022 8:07 AM

## 2022-09-16 NOTE — Discharge Instructions (Addendum)
BRONCHOSCOPY DISCHARGE INSTRUCTIONS  - Please have someone to drive you home - Please be careful with activities for next 24 hours - You can eat 2-4 hours after getting home provided you are fully alert, able to cough, and are not nauseated or vomiting and     feel well - You are expected to have low grade fever or cough some amount of blood for next 24-48 hours; if this worsens call us - If you are very short of breath or coughing blood or chest pain or not feeling well, call us [phone] anytime or go to emergency room - For followup appointment - see below. Dr Chase Caller or nurse practitioner in Pulmonary is working oin getting an appt for you in feb 2024. You will be having a CT scan chest next week   Future Appointments  Date Time Provider [place]  09/21/2022 10:00 AM Mignon Pine, DO [facility]  10/28/2022  3:00 PM GI-BCG DX DEXA 1 GI-BCGDG GI-BREAST CE  12/06/2022  9:15 AM Mcarthur Rossetti, MD [facility] None

## 2022-09-16 NOTE — Telephone Encounter (Signed)
Bronch was done today - will route back to triage to close MyChart message.

## 2022-09-16 NOTE — H&P (Signed)
OV 09/16/2022  Subjective:  Patient ID: Elaine Navarro, female , DOB: 1945/11/24 , age 77 y.o. , MRN: [id number] , ADDRESS: [place] [id number] PCP Charlane Ferretti, MD Patient Care Team: Charlane Ferretti, MD as PCP - General (Internal Medicine)  This Provider for this visit: Treatment Team:  Attending Provider: Brand Males, MD    09/16/2022 -preoperative note prior to her bronchoscopy.   HPI Elaine Navarro 77 y.o. -in April 2023 she did have right upper lobe infiltrates suggestive of MAC.  Her QuantiFERON gold had been negative.  Which she was treated with antituberculous treatment by primary care.  But she got side effects with this.  Then on 06/30/2022 she saw [facility] practitioner in pulmonary clinic.  This treatment.  She did get a sputum culture that ultimately grew haemophilus influenza.  Patient took treatment for that and went on a [place].  When she got back from [place] cruise she also had COVID-19.  Even at the time of the October 2023 visit she was having chronic cough.  I saw her in the clinic 08/16/2022 she was continuing to have chronic cough.  Bronchoscopy was scheduled for 10/17/2022.  Empiric Spiriva was started.  Then on 09/02/2022 we got a call from the lab that the sputum from 07/01/2022 was actually growing Mycobacterium tuberculosis [not MAI].  I repeated her QuantiFERON gold it was again negative.  Referred her to the public health department infectious disease clinic.  Then yesterday got a call from Dr. Alvester Chou at [place] requesting a bronchoscopy because in the sputum and failed.    Her most recent chest x-ray was in October 2023 showing right upper lobe infiltrates.-There is new from January 2023.  Her CT scan showed right upper lobe clustered infiltrates in the right apical medial segment that is new from 2012.  Suspect     She does have a history of lymphocytic colitis and duodenal  ulcer disease.  In 2020 when she did have a 6 mm ulcer at the duodenal bulb.  H. pylori was negative at that time.   Today 09/16/2022: She confirms NPO.  She states she is continues to have chronic cough.  She also has sinus drainage.  She is able to swim without much of a problem.  Did a bedside chest x-ray at that official report is pending but it looks hyperinflated and possible right upper lobe infiltrates but not much significant change   N.p.o. confirmed.  She did have morning medicines.  No new complaints.  We discussed moderate sedation but she prefers to have general anesthesia.  Blood allergy test and autoimmune panel from 08/19/2022 was normal including immunoglobulins. PFT     Latest Ref Rng & Units 12/28/2021   10:42 AM  PFT Results  FVC-Pre L 3.18   FVC-Predicted Pre % 121   FVC-Post L 3.07   FVC-Predicted Post % 117   Pre FEV1/FVC % % 72   Post FEV1/FCV % % 80   FEV1-Pre L 2.29   FEV1-Predicted Pre % 117   FEV1-Post L 2.44   DLCO uncorrected ml/min/mmHg 15.17   DLCO UNC% % 82   DLCO corrected ml/min/mmHg 15.17   DLCO COR %Predicted % 82   DLVA Predicted % 72   TLC L 4.40   TLC % Predicted % 89   RV % Predicted % 64    CT chest apirl 2023   IMPRESSION: 1. Small  pulmonary nodules of the dependent left lower lobe are stable on examinations dating back to at least 09/15/2010 and definitively benign. No further follow-up or characterization is indicated for these nodules. 2. Clustered centrilobular nodularity and consolidation of the medial right pulmonary apex with some associated pleuroparenchymal scarring, as well as of the peripheral posterior left upper lobe. 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 These findings are new in comparison to remote prior CT of the chest dated 09/15/2010.   Aortic Atherosclerosis (ICD10-I70.0).     Electronically Signed   By: Delanna Ahmadi M.D.   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   reports that she quit smoking about 39 years ago. Her smoking use included cigarettes. She has a 30.00 pack-year smoking history. She has never used smokeless tobacco.  Past Surgical History:  Procedure Laterality Date   ABDOMINOPLASTY  2010   APPENDECTOMY  1962   BREAST EXCISIONAL BIOPSY Bilateral 2001   x 3   CATARACT EXTRACTION, BILATERAL Bilateral 01/2020   Dental implants  1993   HEMORRHOID SURGERY  2000   JOINT REPLACEMENT     KNEE ARTHROSCOPY W/ DEBRIDEMENT Right 07/2020   Dr. Berenice Primas   TOTAL KNEE ARTHROPLASTY Right 01/21/2022   Procedure: RIGHT TOTAL KNEE ARTHROPLASTY;  Surgeon: Mcarthur Rossetti, MD;  Location: [facility];  Service: Orthopedics;  Laterality: Right;  RFNA   VAGINAL HYSTERECTOMY  1974   Dysfunctional uterine bleeding.    Allergies  Allergen Reactions   Caine-1 [Lidocaine] Swelling    Allergic to Septocaine Articaine HCL 4% w/epi   Indomethacin Swelling   Other Diarrhea    Dairy Products    Articaine-Epinephrine Other (See Comments)   Gabapentin     "Twitches"   Hydrocodone Nausea Only   Latex Swelling    Possible allergy   Lamisil [Terbinafine] Rash    Immunization History  Administered Date(s) Administered   Fluad Quad(high Dose 65+) 05/29/2022   Hepatitis A 03/10/2003, 10/15/2003   Hepatitis B 04/15/2003, 05/21/2003, 10/15/2003   Influenza Split 06/09/2014, 06/08/2015, 06/08/2017, 06/19/2018, 05/27/2019, 05/18/2020, 05/31/2021   Influenza, High Dose Seasonal PF 06/19/2018   Influenza,inj,Quad PF,6+ Mos 06/13/2011   Influenza-Unspecified 05/16/2013   PFIZER(Purple Top)SARS-COV-2 Vaccination 10/01/2019, 10/21/2019,  05/07/2020, 12/15/2020, 05/31/2021, 06/01/2022   Pneumococcal Conjugate-13 10/18/2013   Pneumococcal Polysaccharide-23 06/22/2010   Respiratory Syncytial Virus Vaccine,Recomb Aduvanted(Arexvy) 05/11/2022   Td 03/10/2003   Tdap 11/08/2010, 12/29/2019, 01/11/2020   Typhoid Inactivated 03/10/2003, 07/05/2013   Yellow Fever 07/05/2013   Zoster, Live 01/19/2006, 12/21/2016, 06/26/2017    Family History  Problem Relation Age of Onset   Hypertension Mother    Cancer Sister        melanoma   Autoimmune disease Sister        crest and sjor   Breast cancer Daughter 73   Lupus Brother      Current Facility-Administered Medications:    chlorhexidine (PERIDEX) 0.12 % solution 15 mL, 15 mL, Mouth/Throat, Once, Oleta Mouse, MD   lactated ringers infusion, , Intravenous, Continuous, Oleta Mouse, MD      Objective:   Vitals:   09/16/22 0636  BP: (!) 165/62  Pulse: 68  Resp: 16  Temp: 97.9 F (36.6 C)  TempSrc: Oral  SpO2:  100%  Weight: 49.3 kg  Height: _0  (1.6 m)    Estimated body mass index is 19.24 kg/m as calculated from the following:   Height as of this encounter: _1  (1.6 m).   Weight as of this encounter: 49.3 kg.  Weight change:   Filed Weights   09/16/22 0636  Weight: 49.3 kg     Physical Exam    General: No distress.  Thin female.  Pleasant. Neuro: Alert and Oriented x 3. GCS 15. Speech normal Psych: Pleasant Resp:  Barrel Chest -no.  Wheeze -no, Crackles -no, No overt respiratory distress CVS: Normal heart sounds. Murmurs -no Ext: Stigmata of Connective Tissue Disease -no HEENT: Normal upper airway. PEERL +. No post nasal drip        Assessment:     Chronic cough Left upper lobe pulmonary infiltrate QuantiFERON gold - December 2023 but October 2023 sputum grew both haemophilus parainfluenza and MTB     Plan:     Risks of pneumothorax, hemothorax, sedation/anesthesia complications such as cardiac or respiratory arrest or  hypotension, stroke and bleeding all explained. Benefits of diagnosis but limitations of non-diagnosis also explained. Patient verbalized understanding and wished to proceed.    Bronchoscopy with lavage.  Moderate sedation discussed but she prefers to have general anesthesia.  CT scan next week    SIGNATURE    Dr. Brand Males, M.D., F.C.C.P,  Pulmonary and Critical Care Medicine Staff Physician, [place] Director - Interstitial Lung Disease  Program  Pulmonary [place] at [place], [place], [address]  Pager: [phone], If no answer or between  15:00h - 7:00h: call [phone] Telephone: [phone]  7:29 AM 09/16/2022

## 2022-09-16 NOTE — Transfer of Care (Signed)
Immediate Anesthesia Transfer of Care Note  Patient: Elaine Navarro  Procedure(s) Performed: VIDEO BRONCHOSCOPY WITHOUT FLUORO BRONCHIAL WASHINGS  Patient Location: Endoscopy Unit  Anesthesia Type:General  Level of Consciousness: awake, alert , and oriented  Airway & Oxygen Therapy: Patient Spontanous Breathing and Patient connected to nasal cannula oxygen  Post-op Assessment: Report given to RN and Post -op Vital signs reviewed and stable  Post vital signs: Reviewed and stable  Last Vitals:  Vitals Value Taken Time  BP    Temp    Pulse    Resp    SpO2      Last Pain:  Vitals:   09/16/22 0636  TempSrc: Oral  PainSc: 0-No pain      Patients Stated Pain Goal: 0 (47/58/30 7460)  Complications: No notable events documented.

## 2022-09-16 NOTE — Anesthesia Procedure Notes (Signed)
Procedure Name: Intubation Date/Time: 09/16/2022 7:51 AM  Performed by: Valda Favia, CRNAPre-anesthesia Checklist: Patient identified, Emergency Drugs available, Suction available and Patient being monitored Patient Re-evaluated:Patient Re-evaluated prior to induction Oxygen Delivery Method: Circle System Utilized Preoxygenation: Pre-oxygenation with 100% oxygen Induction Type: IV induction Ventilation: Mask ventilation without difficulty Laryngoscope Size: Mac and 4 Grade View: Grade I Tube type: Oral Tube size: 8.5 mm Number of attempts: 1 Airway Equipment and Method: Stylet and Oral airway Placement Confirmation: ETT inserted through vocal cords under direct vision, positive ETCO2 and breath sounds checked- equal and bilateral Tube secured with: Tape Dental Injury: Teeth and Oropharynx as per pre-operative assessment

## 2022-09-16 NOTE — Telephone Encounter (Signed)
CT order placed

## 2022-09-16 NOTE — Addendum Note (Signed)
Addended by: Gavin Potters R on: 09/16/2022 10:38 AM   Modules accepted: Orders

## 2022-09-16 NOTE — Anesthesia Preprocedure Evaluation (Addendum)
Anesthesia Evaluation  Patient identified by MRN, date of birth, ID band Patient awake    Reviewed: Allergy & Precautions, NPO status , Patient's Chart, lab work & pertinent test results  History of Anesthesia Complications (+) PONV and history of anesthetic complications  Airway Mallampati: III  TM Distance: >3 FB Neck ROM: Full    Dental  (+) Dental Advisory Given, Implants   Pulmonary shortness of breath, neg sleep apnea, neg COPD, neg recent URI, former smoker   breath sounds clear to auscultation       Cardiovascular hypertension, Pt. on medications (-) angina (-) Past MI  Rhythm:Regular     Neuro/Psych  Neuromuscular disease  negative psych ROS   GI/Hepatic Neg liver ROS,GERD  ,,  Endo/Other  negative endocrine ROS    Renal/GU negative Renal ROS     Musculoskeletal  (+) Arthritis ,    Abdominal   Peds  Hematology negative hematology ROS (+) Lab Results      Component                Value               Date                      WBC                      5.0                 08/19/2022                HGB                      13.7                08/19/2022                HCT                      40.1                08/19/2022                MCV                      94.8                08/19/2022                PLT                      256.0               08/19/2022              Anesthesia Other Findings   Reproductive/Obstetrics                             Anesthesia Physical Anesthesia Plan  ASA: 2  Anesthesia Plan: General   Post-op Pain Management: Minimal or no pain anticipated   Induction: Intravenous  PONV Risk Score and Plan: 4 or greater and Propofol infusion, TIVA and Treatment may vary due to age or medical condition  Airway Management Planned: Oral ETT  Additional Equipment: None  Intra-op Plan:   Post-operative Plan: Extubation in OR  Informed Consent: I have  reviewed the  patients History and Physical, chart, labs and discussed the procedure including the risks, benefits and alternatives for the proposed anesthesia with the patient or authorized representative who has indicated his/her understanding and acceptance.     Dental advisory given  Plan Discussed with: CRNA and Surgeon  Anesthesia Plan Comments:        Anesthesia Quick Evaluation

## 2022-09-16 NOTE — Telephone Encounter (Signed)
Front desk pls give first aavail appt with me in Feb 2024 for cough and nodules - 30 min . She is supposted to have CT ches next week

## 2022-09-16 NOTE — Telephone Encounter (Signed)
Patient is scheduled on 10/11/2022 at 3:45pm for 30 minute visit with Dr. Chase Caller. Nothing further needed.

## 2022-09-18 ENCOUNTER — Encounter (HOSPITAL_COMMUNITY): Payer: Self-pay | Admitting: Internal Medicine

## 2022-09-18 LAB — CULTURE, RESPIRATORY W GRAM STAIN
Culture: NO GROWTH
Gram Stain: NONE SEEN

## 2022-09-18 LAB — ACID FAST SMEAR (AFB, MYCOBACTERIA): Acid Fast Smear: NEGATIVE

## 2022-09-19 DIAGNOSIS — G43719 Chronic migraine without aura, intractable, without status migrainosus: Secondary | ICD-10-CM | POA: Diagnosis not present

## 2022-09-19 NOTE — Anesthesia Postprocedure Evaluation (Signed)
Anesthesia Post Note  Patient: [name] K Bordeaux  Procedure(s) Performed: VIDEO BRONCHOSCOPY WITHOUT FLUORO BRONCHIAL WASHINGS     Patient location during evaluation: PACU Anesthesia Type: General Level of consciousness: awake and alert Pain management: pain level controlled Vital Signs Assessment: post-procedure vital signs reviewed and stable Respiratory status: spontaneous breathing, nonlabored ventilation and respiratory function stable Cardiovascular status: blood pressure returned to baseline and stable Postop Assessment: no apparent nausea or vomiting Anesthetic complications: no  No notable events documented.  Last Vitals:  Vitals:   09/16/22 0827 09/16/22 0837  BP: 122/62 126/68  Pulse: 68 70  Resp: 10 12  Temp:    SpO2: 99% 99%    Last Pain:  Vitals:   09/16/22 0837  TempSrc:   PainSc: 0-No pain                 Mavis Fichera

## 2022-09-20 LAB — CYTOLOGY - NON PAP

## 2022-09-21 ENCOUNTER — Ambulatory Visit: Payer: Medicare Other | Admitting: Internal Medicine

## 2022-09-21 ENCOUNTER — Ambulatory Visit (HOSPITAL_COMMUNITY)
Admission: RE | Admit: 2022-09-21 | Discharge: 2022-09-21 | Disposition: A | Discharge status: Home / Self Care | Payer: Medicare Other | Source: Ambulatory Visit | Attending: Internal Medicine | Admitting: Internal Medicine

## 2022-09-21 ENCOUNTER — Telehealth: Payer: Self-pay

## 2022-09-21 DIAGNOSIS — Z201 Contact with and (suspected) exposure to tuberculosis: Secondary | ICD-10-CM | POA: Insufficient documentation

## 2022-09-21 DIAGNOSIS — R918 Other nonspecific abnormal finding of lung field: Secondary | ICD-10-CM | POA: Diagnosis not present

## 2022-09-21 DIAGNOSIS — R053 Chronic cough: Secondary | ICD-10-CM | POA: Insufficient documentation

## 2022-09-21 NOTE — Telephone Encounter (Signed)
Called patient and canceled appointment for today. Did not have any questions at this time.  Leatrice Jewels, RMA

## 2022-09-21 NOTE — Telephone Encounter (Signed)
-----   Message from Mignon Pine, DO sent at 09/21/2022  5:16 AM EST ----- Regarding: Patient appt this morning and positive TB sputum cultures Good morning,  Can you please reach out to patient and cancel her appointment.  I talked to Dr Junius Creamer yesterday evening with the Health Department.  She has positive sputum cultures from October that identified Mycobacterium tuberculosis.  Dr Junius Creamer is going to consult with the [facility] TB expert regarding next steps but I had recommended she receive treatment.  The health department directs treatment, contact tracing, DOT, etc.  She does not need appt with [place] since the health department will manage pulmonary TB.    Thanks, Mitzi Hansen

## 2022-09-22 ENCOUNTER — Telehealth: Payer: Self-pay | Admitting: Internal Medicine

## 2022-09-22 NOTE — Telephone Encounter (Signed)
Spoke with pt who concerned that the Health Department is wanting to start a new medication and have not told her what the medication is or why she will be taking it. She doesn't want any new medication to interfere with treatment plan already in place with Dr. Chase Caller. Pt did state that she has been in contact with the Health Department and does have a meeting with someone from there on Tues 09/27/22. Pt doesn't understanding why all this is happening when Dr. Chase Caller told the pt he did not think she had TB. Pt was advised to follow the advice of the Health Department because they would be the group treating the TB but pt wants to hear if Dr. Brantley Persons to provide peace of mind.

## 2022-09-22 NOTE — Telephone Encounter (Addendum)
Please let patient know that  That if she has tuberculosis the presentation is unusual.  I want to clarify that .  I wanted make sure I did not miss communicate Her sputum AFB is positive for Mycobacterium tuberculosis from October 2023.   That is why she underwent a bronchoscopy with lavage and the results are still pending I did this bronchoscopy at the request of public health department  I have informed the public health department Dr. Junius Creamer 2 days ago that the results were still pending from the bronchoscopy but I will follow-up with the public health department again. She did have infectious diseases appointment at our clinic in January 2024 -I do not see that anymore.  Do you know what happened? I will call the patient myself today/tomorrow to explain I understand is a frustrating situation Please send a phone note back

## 2022-09-23 ENCOUNTER — Ambulatory Visit (HOSPITAL_COMMUNITY): Payer: Medicare Other

## 2022-09-23 NOTE — Telephone Encounter (Signed)
Pt called the office. I provided her with the info per MR. Pt said that [facility] called and cancelled the appt that she had scheduled with them. She said when they called to cancel the appt, they did not provide her with any explanation to why they were cancelling the appt.  Pt is also wanting to know the results of her recent CT that was performed.  Pt is hoping to receive a call from Dr. Chase Caller. Routing this to Dr. Chase Caller. Please advise.

## 2022-09-23 NOTE — Telephone Encounter (Signed)
I spoke to Dr. Jule Ser at infectious diseases.  He said that public health department deals with tuberculosis treatment and also adjust medications in relationship to side effects and communication with the state health department.  Therefore the local infectious disease department at [place] does not treat tuberculosis.  I spoke to the patient and I clarified the following  1) that I did not think the presentation for tuberculosis was unusual but that does not mean she does not have tuberculosis.  Her QuantiFERON gold is negative but she has had previous exposure as a child and she has right upper lobe infiltrates.  Her current CT chest shows these infiltrates might be getting worse.  Therefore explained this could be emergence of active Mycobacterium tuberculosis.  And given the sputum culture positivity from October 2023 [currently BAL AFB smear from January 2024 is negative] -she should consider Mycobacterium tuberculosis treatment as outlined by the public health department.  Informed her that January 2024 results can take another 6 weeks to show up  2) as she was worried about the control and restrictions that the public health department is placing on her.  I did inform her that tuberculosis is contagious and in the days when tuberculosis was quite prevalent this was common and this is actually needed  3) she was concerned about the side effects of antituberculous treatment.  She did state that her tongue swelled up when she took a few days of empiric treatment.  I did indicate to her to discuss with Dr. Junius Creamer of the public health department.  I did call Dr. Junius Creamer on her cell acid at 4:47 PM and today at 5:03 PM but it went straight to voicemail.  I also sent her a text message.  4) I did indicate to the patient that if these nodules do not improve and they continue to grow we will have to look for alternate etiology especially if she refuses to take treatment and this would mean  biopsy.  5) also indicated to patient that failure to take Mycobacterium tuberculosis treatment could result in significant morbidity [she already has significant cough]  PLAN  - Please schedule a follow-up visit for the patient to see me or nurse practitioner in 2 months

## 2022-09-26 NOTE — Telephone Encounter (Signed)
Pt does have an upcoming appt scheduled 1/30. Do you want [place] to go ahead and reschedule this appt?

## 2022-09-27 DIAGNOSIS — M25552 Pain in left hip: Secondary | ICD-10-CM | POA: Diagnosis not present

## 2022-09-27 DIAGNOSIS — F4321 Adjustment disorder with depressed mood: Secondary | ICD-10-CM | POA: Diagnosis not present

## 2022-09-27 DIAGNOSIS — M47816 Spondylosis without myelopathy or radiculopathy, lumbar region: Secondary | ICD-10-CM | POA: Diagnosis not present

## 2022-09-27 DIAGNOSIS — G894 Chronic pain syndrome: Secondary | ICD-10-CM | POA: Diagnosis not present

## 2022-09-28 DIAGNOSIS — A150 Tuberculosis of lung: Secondary | ICD-10-CM | POA: Diagnosis not present

## 2022-09-28 NOTE — Telephone Encounter (Signed)
Spoke with patient. Advised of Dr. Orvil Feil recommendations. Nothing further needed.

## 2022-09-28 NOTE — Telephone Encounter (Signed)
I will keep tht 10/11/22 appt

## 2022-10-05 DIAGNOSIS — H04123 Dry eye syndrome of bilateral lacrimal glands: Secondary | ICD-10-CM | POA: Diagnosis not present

## 2022-10-10 DIAGNOSIS — L658 Other specified nonscarring hair loss: Secondary | ICD-10-CM | POA: Diagnosis not present

## 2022-10-10 DIAGNOSIS — L578 Other skin changes due to chronic exposure to nonionizing radiation: Secondary | ICD-10-CM | POA: Diagnosis not present

## 2022-10-10 DIAGNOSIS — L298 Other pruritus: Secondary | ICD-10-CM | POA: Diagnosis not present

## 2022-10-11 ENCOUNTER — Encounter: Payer: Self-pay | Admitting: Internal Medicine

## 2022-10-11 ENCOUNTER — Ambulatory Visit (INDEPENDENT_AMBULATORY_CARE_PROVIDER_SITE_OTHER): Payer: Medicare Other | Admitting: Internal Medicine

## 2022-10-11 VITALS — BP 134/72 | HR 79 | Temp 98.9°F | Ht 63.0 in | Wt 112.0 lb

## 2022-10-11 DIAGNOSIS — R053 Chronic cough: Secondary | ICD-10-CM

## 2022-10-11 DIAGNOSIS — Z201 Contact with and (suspected) exposure to tuberculosis: Secondary | ICD-10-CM

## 2022-10-11 DIAGNOSIS — R918 Other nonspecific abnormal finding of lung field: Secondary | ICD-10-CM | POA: Diagnosis not present

## 2022-10-11 DIAGNOSIS — A159 Respiratory tuberculosis unspecified: Secondary | ICD-10-CM

## 2022-10-11 DIAGNOSIS — Z87891 Personal history of nicotine dependence: Secondary | ICD-10-CM

## 2022-10-11 NOTE — Progress Notes (Signed)
.      OV 08/18/2022   OV 11/09/2021  Subjective:  Patient ID: Elaine Navarro, female , DOB: Nov 30, 1945 , age 77 y.o. , MRN: [id number] , ADDRESS: [place] [id number] PCP Vernie Shanks, MD Patient Care Team: Vernie Shanks, MD as PCP - General (Family Medicine)  This Provider for this visit: Treatment Team:  Attending Provider: Brand Males, MD    11/09/2021 -   Chief Complaint  Patient presents with   Consult    Pt had a CT performed which is the reason for today's visit.     HPI Elaine Navarro 77 y.o. -presents with her husband.  Concern is left lower lobe nodule 4 mm seen on CT angiogram abdomen done for renal artery aneurysm.  She sees physician at [facility] for renal artery aneurysm.  During this time she had a CT scan of the abdomen done with contrast here in [place].  This showed a 4 mm lung nodule in the left lower lobe.  According to Dr. Burt Ek it is stable over 10 years.  I personally visualized this.  Some bibasilar atelectasis also reported although I am not convinced about this.  Patient herself states that overall she is stable.  She says that in July 2022 she had COVID and after that she had significant cough.  Primary care treated with cough Tessalon Perles.  The cough is now resolved for the last 5 weeks.  Currently no undue shortness of breath.  No cough no wheezing no paroxysmal nocturnal dyspnea no orthopnea.  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  She is a remote 30 pack smoker having quit in 1984 when she moved from [place] to [place].  While in [place] as a child she got exposed to her both grandfather and aunt with tuberculosis.  She says she herself was skin test positive but denies any latent TB treatment.  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  But this was just general air pollution.  She is worried about the implications of all this for her health.  She says clinically her previous primary care physician told her she might have COPD.  She also is a brother with COPD.  She wants to be sure that there is no undue health issues with her lung.  She also wants to continue doing ballroom dancing at a championship level.    CT Chest data 10/21/21   Narrative & Impression  CLINICAL DATA:  Renal artery aneurysm   EXAM: CT ANGIOGRAPHY ABDOMEN   TECHNIQUE: Multidetector CT imaging of the abdomen was performed using the standard protocol during bolus administration of intravenous contrast. Multiplanar reconstructed images and MIPs were obtained and reviewed to evaluate the vascular anatomy.   RADIATION DOSE REDUCTION: This exam was performed according to the departmental dose-optimization program which includes automated exposure control, adjustment of the mA and/or kV according to patient size and/or use of iterative reconstruction technique.   CONTRAST:  69m OMNIPAQUE IOHEXOL 350 MG/ML SOLN   COMPARISON:  CT abdomen dated August 13, 2012   FINDINGS: VASCULAR   Aorta: Normal caliber abdominal aorta with mild calcified plaque and no significant stenosis.   Celiac: Normal caliber celiac artery with no significant atherosclerotic disease or stenosis.  SMA: Visualized SMA is normal in caliber with no significant atherosclerotic disease or stenosis.   Renals: Peripherally calcified renal artery aneurysm arising from a lower pole segmental branch measuring 1.1 x 1.1 x 1.3 cm, unchanged in size when compared to prior exam. Bilateral main renal arteries are normal in caliber with minimal calcified plaque and no stenosis.   IMA: Visualized portion is normal in caliber with no evidence of stenosis.   Veins: No venous abnormality.   Review of the MIP images confirms the above  findings.   NON-VASCULAR   Lower chest: Stable solid 4 mm nodule of the left lower lobe located on image 13, likely benign. Bibasilar atelectasis.   Hepatobiliary: No focal liver abnormality is seen. No gallstones, gallbladder wall thickening, or biliary dilatation.   Pancreas: Unremarkable. No pancreatic ductal dilatation or surrounding inflammatory changes.   Spleen: Normal in size without focal abnormality.   Adrenals/Urinary Tract: Adrenal glands are unremarkable. Kidneys are normal, without renal calculi, focal lesion, or hydronephrosis. Bladder is unremarkable.   Stomach/Bowel: Visualized portions of the small and large bowel are unremarkable.   Lymphatic: No pathologically enlarged lymph nodes seen in the abdomen.   Other: No abdominal wall hernia or abdominal ascites.   Musculoskeletal: No acute or significant osseous findings.   IMPRESSION: 1. 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 2.  Aortic Atherosclerosis (ICD10-I70.0).     Electronically Signed   By: Yetta Glassman M.D.   On: 10/22/2021 10:03       12/28/2021 Follow up : Lung nodule  Patient returns for a 55-monthfollow-up.  Patient was seen for pulmonary consult November 09, 2021 for abnormal CT chest that showed 4 mm lung nodule.  Patient also has some mild shortness of breath with activities.  Has a family history of TB and COPD.  She is a former smoker.  Patient was set up for a high-resolution CT chest that showed no enlarged lymph nodes.  A 0.4 cm nodule in the left lower lobe that is stable compared back to 2012.  Consistent with a benign etiology.  Clustered nodularity and consolidation in the right apex with associated parenchymal scarring as well in the left upper lobe.  Findings are consistent with possible atypical infection such as atypical Mycobacterium.  Alpha-1 testing was normal level and phenotype.  Patient says she has very minimum cough if any.  Shortness of breath is  very mild in nature.  Patient was set up for pulmonary function testing that was normal that showed FEV1 at 125%, ratio 80, FVC 117%.,  No significant bronchodilator response, DLCO 82%. She says that she gets bronchitis once or twice a year.  Usually resolves pretty easily.  She says most days she has no cough at all.  Is not limited by activities.  She did have COVID-19 in July 2022. Patient does have colitis and has issues with weight loss at times.  Says occasionally she has some swallow issues.  She denies any hemoptysis, discolored mucus, fever.   IMPRESSION: 1. Small pulmonary nodules of the dependent left lower lobe are stable on examinations dating back to at least 09/15/2010 and definitively benign. No further follow-up or characterization is indicated for these nodules. 2. Clustered centrilobular nodularity and consolidation of the medial right pulmonary apex with some associated pleuroparenchymal scarring, as well as of the peripheral posterior left upper lobe. 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 These findings are new in comparison to remote prior CT of  the chest dated 09/15/2010.   Aortic Atherosclerosis (ICD10-I70.0).     Electronically Signed   By: Delanna Ahmadi M.D.   On: 12/17/2021 13:00     06/30/2022 Patient presents today for acute office visit. She has a history of pulmonary nodules.  She had a CT in April 2023 that showed findings atypical mycobacterium. She has had a cough since August.  Primary care prescribed rifampin, ethambutol and azithromycin recently d/t her symptoms and image findings. She developed white tongue and sore throat after taking medication for 3 days. She did notice improvement in cough while on abx. She has had no sputum cultures. She has never had a bronchoscopy. No recent chest imaging. She plans to travel to [place] on 07/09/22. No significant shortness of breath except with coughing fits.  Cough can be productive with white-yellow phlegm. She is afebrile.    Subjective:  Patient ID: Elaine Navarro, female , DOB: [birth year] , age 42 y.o. , MRN: [id number] , ADDRESS: [place] [id number] PCP Charlane Ferretti, MD Patient Care Team: Charlane Ferretti, MD as PCP - General (Internal Medicine)  This Provider for this visit: Treatment Team:  Attending Provider: Brand Males, MD    08/18/2022 -   Chief Complaint  Patient presents with   Follow-up    Pt states she is still coughing and also has chest congestion as well as tightness in chest. States her cough is worse in the evening and states she is coughing up white phlegm.     HPI Elaine Navarro 77 y.o. -returns for follow-up.  Presents with her husband.  I saw her earlier in the year in 2023.  After that she has had visits with the nurse practitioner.  She tells me that the cough that started in the summer 2022 after COVID still persist.  After I saw her I ordered CT scan of the chest that showed nodularity that was clustered.  Her primary care physician empirically prescribed a triple antibiotic therapy for MAI infection.  However she developed a coated tongue.  Also given the empiric nature of the diagnosis we recommended she stop it.  Which she did.  After that in October 2023 she saw a Designer, jewellery.  Sputum culture grew haemophilus influenza and Candida.  She was given Z-Pak.  She did leave for her trip to [place] on 07/09/2022 but the trip got changed because of the [place] war.  Instead she went to [place] and from there it was a [place] cruise from 07/11/2022.  She travel to the [place] in [place].  She went to [place] she went to [place] and she came back to [place] via [place] on 07/24/2022.  Shortly after arriving she got COVID-19 for the second time.  She believes she contracted this in the flight.  During the trip her cough is significantly  worse rated at 8 out of 9.  The Z-Pak that she was prescribed she took it at that time and it did help but currently she is back with residual cough at the level 5 out of 10.  Chest feels tight.  There is occasional white stuff.  Particularly sputum.  She is quite miserable with the symptoms.  She also has a runny nose for the last 5 weeks.  She wants her symptoms addressed.  Labs show normal QuantiFERON gold but no allergy workup. Alpha-1 antitrypsin is normal. She is a former 30 pack smoking history.   Of  note for this visit I personally visualized the CT scan with the previous CT scan from April 2023.     OV 10/11/2022  Subjective:  Patient ID: Elaine Navarro, female , DOB: [birth year] , age 81 y.o. , MRN: [id number] , ADDRESS: [place] [id number] PCP Charlane Ferretti, MD Patient Care Team: Charlane Ferretti, MD as PCP - General (Internal Medicine)  This Provider for this visit: Treatment Team:  Attending Provider: Brand Males, MD    10/11/2022 -   Chief Complaint  Patient presents with   Follow-up    Cough is improving. She has not started her spiriva due to high cost. Her cough is occ prod with white sputum. Appetite is good. No fevers, sweats.   #Former heavy smoker 30 pack - #Right upper lobe nodule April 2023 worse in January 2024  -Sputum culture + October 2023 for Mycobacterium tuberculosis and also haemophilus  -Multiple QuantiFERON gold negative  -exposed to tuberculosis as an infant from her grandfather in the 66s   HPI [place] 3 y.o. -returns for follow-up.  At this point in time under the coordination of Department of [place] she is completed 2 weeks of Mycobacterium tuberculosis treatment and date labs of therapy.  She already states that her cough is improved significantly her chest tightness is improved significantly although residual symptoms are still there.  She is feeling better she is tolerating the  antituberculous treatment well.  She is no longer considered contagious and has been allowed to go in public according to her history.  Her husband is with her but he is not providing any independent history today.  The test sputum culture was done by nurse practitioner in October 2023.  Right prior to starting the antituberculous treatment we did a CT scan of the chest and a pulmonary right upper lobe nodules were actually getting worse.  A bronchoscopy was done early January 2024 and the nucleocapsid amplification of Mycobacterium tuberculosis negative but the culture is still pending and so far no growth.  Nevertheless she is feeling better.  Of note she had some mild emphysema on the CT scan we ordered Spiriva but it is too expensive and she is feeling better so we told her to hold off.   Her current response to typical treatment and terms of cough is profile below   Dr Lorenza Cambridge Reflux Symptom Index (> 13-15 suggestive of LPR cough) Pre RX  - ATb Rx Post Rx - 2 weeks  Hoarseness of problem with voice 5 2  Clearing  Of Throat 5 3  Excess throat mucus or feeling of post nasal drip 5 3  Difficulty swallowing food, liquid or tablets 3 2  Cough after eating or lying down 5 3  Breathing difficulties or choking episodes 4 2  Troublesome or annoying cough 5 3  Sensation of something sticking in throat or lump in throat 5 2  Heartburn, chest pain, indigestion, or stomach acid coming up 3 2  TOTAL 40 21         PFT     Latest Ref Rng & Units 12/28/2021   10:42 AM  PFT Results  FVC-Pre L 3.18   FVC-Predicted Pre % 121   FVC-Post L 3.07   FVC-Predicted Post % 117   Pre FEV1/FVC % % 72   Post FEV1/FCV % % 80   FEV1-Pre L 2.29   FEV1-Predicted Pre % 117   FEV1-Post L 2.44   DLCO uncorrected  ml/min/mmHg 15.17   DLCO UNC% % 82   DLCO corrected ml/min/mmHg 15.17   DLCO COR %Predicted % 82   DLVA Predicted % 72   TLC L 4.40   TLC % Predicted % 89   RV % Predicted % 64     CT cest  09/21/22   Narrative & Impression  CLINICAL DATA:  77 year old female presents for evaluation of chronic productive cough.   EXAM: CT CHEST WITHOUT CONTRAST   TECHNIQUE: Multidetector CT imaging of the chest was performed following the standard protocol without IV contrast.   RADIATION DOSE REDUCTION: This exam was performed according to the departmental dose-optimization program which includes automated exposure control, adjustment of the mA and/or kV according to patient size and/or use of iterative reconstruction technique.   COMPARISON:  December 17, 2021   FINDINGS: Cardiovascular: Calcified aortic atherosclerosis is mild. Normal caliber of the thoracic aorta. Normal heart size. Scattered coronary artery calcifications. Normal caliber of central pulmonary vasculature.   Mediastinum/Nodes: LEFT thyroid nodule up to 2.3 cm with areas of calcification. No adenopathy in the chest. Esophagus is grossly normal by CT.   Lungs/Pleura: Biapical scarring. More confluent area of nodularity in the RIGHT upper lobe has increased considerably since previous imaging 3.0 x 1.3 cm. Previously this was confined to an area along the medial and lateral surface of the apical pleural surfaces.   There are diffuse areas of nodularity in the RIGHT upper lobe metabolic so increased, some areas new since previous imaging. Juxtapleural nodule in the anterior RIGHT upper lobe (image 36/5) 8 mm, this is a new finding. Grouped nodules in the RIGHT upper lobe and some tree in bud on images 36 through 41 are also new.   Some improvement with respect to bronchovascular nodularity extending towards the RIGHT lung apex that was seen in the medial RIGHT upper lobe on the previous study. No signs of additional consolidative process or evidence of pleural effusion. Mild LEFT apical scarring.   Stable LEFT lower lobe nodule (image 132/5) 4 mm. Airways are patent. No consolidation or sign of pleural  effusion.   Upper Abdomen: No acute upper abdominal findings to the extent evaluated.   Musculoskeletal: No chest wall mass.  Spinal degenerative changes.   IMPRESSION: 1. Resolution of some areas of nodularity with worsening of peripheral areas of nodularity in the RIGHT upper lobe. Findings favor sequela of infection perhaps MAI or atypical infection. 2. More confluent area of nodularity also having developed since previous imaging. Would suggest short interval follow-up to ensure that this area shows improvement and does not show persistent underlying masslike area or nodule. Would also consider correlation with respiratory symptoms and pulmonology consultation if not yet obtained. 3. LEFT thyroid nodule up to 2.3 cm with areas of calcification. Suggest follow-up thyroid ultrasound if not yet obtained. 4. Aortic atherosclerosis and coronary artery disease.   Aortic Atherosclerosis (ICD10-I70.0).     Electronically Signed   By: Zetta Bills M.D.   On: 09/22/2022 14:09     has a past medical history of Aneurysm of renal artery ([place]), Fracture of right wrist (2011), GERD (gastroesophageal reflux disease), Hypertension, Left bundle branch block, Left thyroid nodule (2003), Lymphocytic colitis (03/2013), Migraines, Osteoporosis, PONV (postoperative nausea and vomiting), Sciatica, and Visceral injury.   reports that she quit smoking about 39 years ago. Her smoking use included cigarettes. She has a 30.00 pack-year smoking history. She has never used smokeless tobacco.  Past Surgical History:  Procedure Laterality Date  ABDOMINOPLASTY  2010   APPENDECTOMY  1962   BREAST EXCISIONAL BIOPSY Bilateral 2001   x 3   BRONCHIAL WASHINGS  09/16/2022   Procedure: BRONCHIAL WASHINGS;  Surgeon: Brand Males, MD;  Location: [facility];  Service: Endoscopy;;   CATARACT EXTRACTION, BILATERAL Bilateral 01/2020   Dental implants  [facility]   JOINT REPLACEMENT      KNEE ARTHROSCOPY W/ DEBRIDEMENT Right 07/2020   Dr. Berenice Primas   TOTAL KNEE ARTHROPLASTY Right 01/21/2022   Procedure: RIGHT TOTAL KNEE ARTHROPLASTY;  Surgeon: Mcarthur Rossetti, MD;  Location: [facility];  Service: Orthopedics;  Laterality: Right;  RFNA   VAGINAL HYSTERECTOMY  1974   Dysfunctional uterine bleeding.   VIDEO BRONCHOSCOPY N/A 09/16/2022   Procedure: VIDEO BRONCHOSCOPY WITHOUT FLUORO;  Surgeon: Brand Males, MD;  [facility];  Service: Endoscopy;  Laterality: N/A;    Allergies  Allergen Reactions   Caine-1 [Lidocaine] Swelling    Allergic to Septocaine Articaine HCL 4% w/epi   Indomethacin Swelling   Other Diarrhea    Dairy Products    Articaine-Epinephrine Other (See Comments)   Gabapentin     "Twitches"   Hydrocodone Nausea Only   Latex Swelling    Possible allergy   Lamisil [Terbinafine] Rash    Immunization History  Administered Date(s) Administered   Fluad Quad(high Dose 65+) 05/29/2022   Hepatitis A 03/10/2003, 10/15/2003   Hepatitis B 04/15/2003, 05/21/2003, 10/15/2003   Influenza Split 06/09/2014, 06/08/2015, 06/08/2017, 06/19/2018, 05/27/2019, 05/18/2020, 05/31/2021   Influenza, High Dose Seasonal PF 06/19/2018   Influenza,inj,Quad PF,6+ Mos 06/13/2011   Influenza-Unspecified 05/16/2013   PFIZER(Purple Top)SARS-COV-2 Vaccination 10/01/2019, 10/21/2019, 05/07/2020, 12/15/2020, 05/31/2021, 06/01/2022   Pneumococcal Conjugate-13 10/18/2013   Pneumococcal Polysaccharide-23 06/22/2010   Respiratory Syncytial Virus Vaccine,Recomb Aduvanted(Arexvy) 05/11/2022   Td 03/10/2003   Tdap 11/08/2010, 12/29/2019, 01/11/2020   Typhoid Inactivated 03/10/2003, 07/05/2013   Yellow Fever 07/05/2013   Zoster, Live 01/19/2006, 12/21/2016, 06/26/2017    Family History  Problem Relation Age of Onset   Hypertension Mother    Cancer Sister        melanoma   Autoimmune disease Sister        crest and sjor   Breast cancer Daughter 63   Lupus Brother       Current Outpatient Medications:    acetaminophen (TYLENOL) 500 MG tablet, Take 500 mg by mouth every 6 (six) hours as needed for moderate pain., Disp: , Rfl:    albuterol (VENTOLIN HFA) 108 (90 Base) MCG/ACT inhaler, Inhale 2 puffs into the lungs every 6 (six) hours as needed for wheezing or shortness of breath., Disp: 8 g, Rfl: 2   amLODipine (NORVASC) 5 MG tablet, Take 5 mg by mouth daily., Disp: , Rfl:    ascorbic acid (VITAMIN C) 500 MG tablet, Take 500 mg by mouth daily., Disp: , Rfl:    budesonide (ENTOCORT EC) 3 MG 24 hr capsule, Take 3 mg by mouth daily as needed (colitis flare)., Disp: , Rfl:    buPROPion (WELLBUTRIN XL) 150 MG 24 hr tablet, Take 1 tablet (150 mg total) by mouth daily., Disp: 90 tablet, Rfl: 4   Calcium Carb-Cholecalciferol (CALCIUM 600 + D PO), Take 1 tablet by mouth 2 (two) times daily., Disp: , Rfl:    Cholecalciferol (VITAMIN D) 50 MCG (2000 UT) tablet, Take 2,000 Units by mouth daily., Disp: , Rfl:    ethambutol (MYAMBUTOL) 400 MG tablet, Take 800 mg by mouth daily., Disp: , Rfl:  ferrous sulfate 325 (65 FE) MG EC tablet, Take 325 mg by mouth every Monday., Disp: , Rfl:    finasteride (PROSCAR) 5 MG tablet, Take 5 mg by mouth daily., Disp: , Rfl:    fluticasone (FLONASE) 50 MCG/ACT nasal spray, Place 1 spray into both nostrils daily as needed for allergies., Disp: , Rfl: 5   isoniazid (NYDRAZID) 300 MG tablet, Take 300 mg by mouth daily., Disp: , Rfl:    L-CYSTEINE PO, Take 500 mg by mouth daily., Disp: , Rfl:    losartan (COZAAR) 100 MG tablet, Take 100 mg by mouth daily., Disp: , Rfl:    Melatonin 5 MG TABS, Take 5 mg by mouth at bedtime as needed (sleep)., Disp: , Rfl:    Menaquinone-7 (VITAMIN K2) 100 MCG CAPS, Take 100 mcg by mouth daily., Disp: , Rfl:    Multiple Vitamin (MULTIVITAMIN) tablet, Take 1 tablet by mouth daily., Disp: , Rfl:    omeprazole (PRILOSEC) 20 MG capsule, Take 20 mg by mouth 2 (two) times daily before a meal., Disp: , Rfl:     Potassium 99 MG TABS, Take 99 mg by mouth daily., Disp: , Rfl:    Povidone, PF, (IVIZIA DRY EYES) 0.5 % SOLN, Place 1 drop into both eyes at bedtime., Disp: , Rfl:    pyrazinamide 500 MG tablet, Take 1,000 mg by mouth daily., Disp: , Rfl:    pyridOXINE (VITAMIN B6) 25 MG tablet, Take 25 mg by mouth daily., Disp: , Rfl:    rifampin (RIFADIN) 150 MG capsule, Take 420 mg by mouth daily., Disp: , Rfl:    SUMAtriptan (IMITREX) 50 MG tablet, Take 50 mg by mouth every 2 (two) hours as needed for migraine., Disp: , Rfl:    thiamine (VITAMIN B-1) 100 MG tablet, Take 100 mg by mouth daily., Disp: , Rfl:    traZODone (DESYREL) 50 MG tablet, Take 50 mg by mouth at bedtime., Disp: , Rfl:    vitamin B-12 (CYANOCOBALAMIN) 500 MCG tablet, Take 500 mcg by mouth every other day., Disp: , Rfl:    Tiotropium Bromide Monohydrate (SPIRIVA RESPIMAT) 1.25 MCG/ACT AERS, Inhale 2 puffs into the lungs daily. (Patient not taking: Reported on 10/11/2022), Disp: 4 g, Rfl: 5      Objective:   Vitals:   10/11/22 1539  BP: 134/72  Pulse: 79  Temp: 98.9 F (37.2 C)  TempSrc: Oral  SpO2: 99%  Weight: 112 lb (50.8 kg)  Height: '5\' 3"'$  (1.6 m)    Estimated body mass index is 19.84 kg/m as calculated from the following:   Height as of this encounter: '5\' 3"'$  (1.6 m).   Weight as of this encounter: 112 lb (50.8 kg).  '@WEIGHTCHANGE'$ @  Filed Weights   10/11/22 1539  Weight: 112 lb (50.8 kg)     Physical Exam General: No distress. Looks well Neuro: Alert and Oriented x 3. GCS 15. Speech normal Psych: Pleasant Resp:  Barrel Chest - no.  Wheeze - no, Crackles - no, No overt respiratory distress CVS: Normal heart sounds. Murmurs - no Ext: Stigmata of Connective Tissue Disease - no HEENT: Normal upper airway. PEERL +. No post nasal drip        Assessment:       ICD-10-CM   1. Chronic cough  R05.3 CT Chest Wo Contrast    2. Lung nodules  R91.8 CT Chest Wo Contrast    3. MTB (Mycobacterium tuberculosis  infection)  A15.9 CT Chest Wo Contrast    4. Exposure  to TB  Z20.1     5. Former heavy cigarette smoker (20-39 per day)  Z87.891 CT Chest Wo Contrast     She is much better but given the fact she was a smoker and she has lung nodules and lung nodules were worse in January 2024 will get another scan in 3 months.    Plan:     Patient Instructions     ICD-10-CM   1. Chronic cough  R05.3     2. Lung nodules  R91.8     3. MTB (Mycobacterium tuberculosis infection)  A15.9     4. Exposure to TB  Z20.1     5. Former heavy cigarette smoker (20-39 per day)  Z87.891        -Glad you are feeling better and significantly improved cough after starting Mycobacterium tuberculosis treatment 2 weeks ago in January 2024 [based on sputum culture result October 2023 coordinated by the [place] awaiting tuberculosis culture from bronchoscopy 09/16/2022  Plan - Can hold off on Spiriva due to improvement and cost issues with Spiriva - Continue tuberculosis treatment through the Department of [place] -Repeat CT scan of the chest without contrast in 3 months [approximately end of April 2024] -    Follow-up - Return in 3 months to see Dr. Chase Caller but after CT scan of the chest  -RSI cough score at follow-up   Moderate Complexity MDM NEW OFFICE  The table below is from the 2021 E/M guidelines, first released in 2021, with minor revisions added in 2023. Must meet the requirements for 2 out of 3 dimensions to qualify.    Number and complexity of problems addressed Amount and/or complexity of data reviewed Risk of complications and/or morbidity  One or more chronic illness with mild exacerbation, progression, or side effects of treatment  Two or more stable chronic illnesses  One undiagnosed new problem with uncertain prognosis  One acute illness with systemic symptoms   Acute complicated injury Must meet the requirements for 1 of 3 of the  categories)  Category 1: Tests and documents, historian  Any combination of 3 of the following:  Assessment requiring an independent historian  Review of prior external records  Review of results of each unique test  Ordering of each unique test    Category 2: Interpretation of tests  Independent interpretation of a test perfromed by another physician/NPP  Category 3: Discuss management/tests  Discussion of magagement or tests with an external physician/NPP Prescription drug management  Decision regarding minor surgery with identfied patient or procedure risk factors  Decision regarding elective major surgery without identified patient or procedure risk factors  Diagnosis or treatment significantly limited by social determinants of health                 SIGNATURE    Dr. Brand Males, M.D., F.C.C.P,  Pulmonary and Critical Care Medicine Staff Physician, [place] Director - Interstitial Lung Disease  Program  [facility] [place] [facility] [place], [place], [address]  Pager: [phone], If no answer or between  15:00h - 7:00h: call [phone] Telephone: [phone]  4:39 PM 10/11/2022

## 2022-10-11 NOTE — Patient Instructions (Addendum)
ICD-10-CM   1. Chronic cough  R05.3     2. Lung nodules  R91.8     3. MTB (Mycobacterium tuberculosis infection)  A15.9     4. Exposure to TB  Z20.1     5. Former heavy cigarette smoker (20-39 per day)  Z87.891        -Glad you are feeling better and significantly improved cough after starting Mycobacterium tuberculosis treatment 2 weeks ago in January 2024 [based on sputum culture result October 2023 coordinated by the [facility] awaiting tuberculosis culture from bronchoscopy 09/16/2022  Plan - Can hold off on Spiriva due to improvement and cost issues with Spiriva - Continue tuberculosis treatment through the [facility] of [place] -Repeat CT scan of the chest without contrast in 3 months [approximately end of April 2024] -    Follow-up - Return in 3 months to see Dr. Chase Caller but after CT scan of the chest  -RSI cough score at follow-up

## 2022-10-12 DIAGNOSIS — Z111 Encounter for screening for respiratory tuberculosis: Secondary | ICD-10-CM | POA: Diagnosis not present

## 2022-10-12 DIAGNOSIS — A150 Tuberculosis of lung: Secondary | ICD-10-CM | POA: Diagnosis not present

## 2022-10-17 DIAGNOSIS — M25552 Pain in left hip: Secondary | ICD-10-CM | POA: Diagnosis not present

## 2022-10-18 LAB — FUNGUS CULTURE WITH STAIN

## 2022-10-18 LAB — FUNGUS CULTURE RESULT

## 2022-10-18 LAB — FUNGAL ORGANISM REFLEX

## 2022-10-21 ENCOUNTER — Ambulatory Visit (HOSPITAL_BASED_OUTPATIENT_CLINIC_OR_DEPARTMENT_OTHER): Payer: Medicare Other

## 2022-10-24 DIAGNOSIS — M25552 Pain in left hip: Secondary | ICD-10-CM | POA: Diagnosis not present

## 2022-10-27 DIAGNOSIS — A150 Tuberculosis of lung: Secondary | ICD-10-CM | POA: Diagnosis not present

## 2022-10-27 DIAGNOSIS — Z111 Encounter for screening for respiratory tuberculosis: Secondary | ICD-10-CM | POA: Diagnosis not present

## 2022-10-28 ENCOUNTER — Other Ambulatory Visit: Payer: Medicare Other

## 2022-10-30 LAB — ACID FAST CULTURE WITH REFLEXED SENSITIVITIES (MYCOBACTERIA): Acid Fast Culture: NEGATIVE

## 2022-10-31 LAB — MTB-RIF NAA W AFB CULT, NON-SPUTUM: Acid Fast Culture: NEGATIVE

## 2022-11-01 ENCOUNTER — Telehealth: Payer: Self-pay | Admitting: Internal Medicine

## 2022-11-01 DIAGNOSIS — A159 Respiratory tuberculosis unspecified: Secondary | ICD-10-CM

## 2022-11-02 DIAGNOSIS — M25552 Pain in left hip: Secondary | ICD-10-CM | POA: Diagnosis not present

## 2022-11-02 NOTE — Telephone Encounter (Signed)
Patient is calling because she states Bronchoscopy results were negative for TB and asks if she needs to still be taking Rifadin. She has been following up at the health dept with thus and they advised one of the test was still coming back positive. She wants to know if the medication is still necessary for just one test or can she stop taking.  Dr. Chase Caller can you please advise?

## 2022-11-03 NOTE — Telephone Encounter (Signed)
Is odd that the bronchoscopy specimen TB test culture is negative (pre Rx) while the sputum ws positive. . Still her cough improved on the TB Rx. And on CT chest Jan 2024 some nodules had improve while there were some other new noudles  Plan  -  This will be health department call whether she can stop TB Rx - instead o3 month CT chst early April 2024 - > we can do 6-8 week one - > early March 2024 (March 1-10) - CT chest without contrast - can help [place] see how the nodules are changing - she will need appt with APP or me shortly after CT

## 2022-11-03 NOTE — Telephone Encounter (Signed)
I spoke with the pt  She is aware of response per Dr Chase Caller  She states she is okay with moving the CT up   PCC's- can you please move her ct up to 3/1-3/10/24 with appt with MR or APP after? Thanks so much!

## 2022-11-08 DIAGNOSIS — M25552 Pain in left hip: Secondary | ICD-10-CM | POA: Diagnosis not present

## 2022-11-09 DIAGNOSIS — Z8711 Personal history of peptic ulcer disease: Secondary | ICD-10-CM | POA: Diagnosis not present

## 2022-11-09 DIAGNOSIS — K52832 Lymphocytic colitis: Secondary | ICD-10-CM | POA: Diagnosis not present

## 2022-11-09 DIAGNOSIS — Z8611 Personal history of tuberculosis: Secondary | ICD-10-CM | POA: Diagnosis not present

## 2022-11-14 ENCOUNTER — Other Ambulatory Visit (HOSPITAL_BASED_OUTPATIENT_CLINIC_OR_DEPARTMENT_OTHER): Payer: Self-pay | Admitting: Obstetrics & Gynecology

## 2022-11-14 DIAGNOSIS — M810 Age-related osteoporosis without current pathological fracture: Secondary | ICD-10-CM

## 2022-11-14 LAB — PNEUMOCYSTIS JIROVECI SMEAR BY DFA

## 2022-11-17 ENCOUNTER — Other Ambulatory Visit (HOSPITAL_COMMUNITY)
Admission: RE | Admit: 2022-11-17 | Discharge: 2022-11-17 | Disposition: A | Discharge status: Home / Self Care | Payer: Medicare Other | Source: Ambulatory Visit | Attending: Obstetrics & Gynecology | Admitting: Obstetrics & Gynecology

## 2022-11-17 ENCOUNTER — Encounter (HOSPITAL_BASED_OUTPATIENT_CLINIC_OR_DEPARTMENT_OTHER): Payer: Self-pay | Admitting: Obstetrics & Gynecology

## 2022-11-17 ENCOUNTER — Ambulatory Visit (INDEPENDENT_AMBULATORY_CARE_PROVIDER_SITE_OTHER): Payer: Medicare Other | Admitting: Obstetrics & Gynecology

## 2022-11-17 VITALS — BP 168/88 | HR 78 | Ht 63.0 in | Wt 113.4 lb

## 2022-11-17 DIAGNOSIS — N898 Other specified noninflammatory disorders of vagina: Secondary | ICD-10-CM | POA: Insufficient documentation

## 2022-11-17 NOTE — Progress Notes (Signed)
GYNECOLOGY  VISIT  CC:   vaginal irritation  HPI: 77 y.o. G32P1012 Married White or Caucasian female here for complaint of vaginal irritation.  She is having a little itching as well.  Denies bleeding and discharge.  Back in October, she was diagnosed with TB via bronchoscopy.  She is on Rifampin, Isoniazid, Pyrazinamine and Vit B6.  This is being managed by the Health Department right now.  She has to have a [employer] appt with nurses at the HD who monitor her when she takes her medication.  She will need to complete six months of therapy.  Husband was negative as well.    Just had repeat blood work done yesterday.  Her liver enzymes had increased just a little bit.  Will have this rechecked in 2 weeks.    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     albuterol (VENTOLIN HFA) 108 (90 Base) MCG/ACT inhaler Inhale 2 puffs into the lungs every 6 (six) hours as needed for wheezing or shortness of breath. 8 g 2   amLODipine (NORVASC) 5 MG tablet Take 5 mg by mouth daily.     ascorbic acid (VITAMIN C) 500 MG tablet Take 500 mg by mouth daily.     budesonide (ENTOCORT EC) 3 MG 24 hr capsule Take 3 mg by mouth daily as needed (colitis flare).     buPROPion (WELLBUTRIN XL) 150 MG 24 hr tablet Take 1 tablet (150 mg total) by mouth daily. 90 tablet 4    Calcium Carb-Cholecalciferol (CALCIUM 600 + D PO) Take 1 tablet by mouth 2 (two) times daily.     Cholecalciferol (VITAMIN D) 50 MCG (2000 UT) tablet Take 2,000 Units by mouth daily.     ethambutol (MYAMBUTOL) 400 MG tablet Take 800 mg by mouth daily.     ferrous sulfate 325 (65 FE) MG EC tablet Take 325 mg by mouth every Monday.     finasteride (PROSCAR) 5 MG tablet Take 5 mg by mouth daily.     fluticasone (FLONASE) 50 MCG/ACT nasal spray Place 1 spray into both nostrils daily as needed for allergies.  5   isoniazid (NYDRAZID) 300 MG tablet Take 300 mg by mouth daily.     L-CYSTEINE PO Take 500 mg by mouth daily.     losartan (COZAAR) 100 MG tablet Take 100 mg by mouth daily.     Melatonin 5 MG TABS Take 5 mg by mouth at bedtime as needed (sleep).     Menaquinone-7 (VITAMIN K2) 100 MCG CAPS Take 100 mcg by mouth daily.     Multiple Vitamin (MULTIVITAMIN) tablet Take 1 tablet by mouth daily.     omeprazole (PRILOSEC) 20 MG capsule Take 20 mg  by mouth 2 (two) times daily before a meal.     Potassium 99 MG TABS Take 99 mg by mouth daily.     Povidone, PF, (IVIZIA DRY EYES) 0.5 % SOLN Place 1 drop into both eyes at bedtime.     pyrazinamide 500 MG tablet Take 1,000 mg by mouth daily.     pyridOXINE (VITAMIN B6) 25 MG tablet Take 25 mg by mouth daily.     rifampin (RIFADIN) 150 MG capsule Take 420 mg by mouth daily.     SUMAtriptan (IMITREX) 50 MG tablet Take 50 mg by mouth every 2 (two) hours as needed for migraine.     thiamine (VITAMIN B-1) 100 MG tablet Take 100 mg by mouth daily.     traZODone (DESYREL) 50 MG tablet Take 50 mg by mouth at bedtime.     vitamin B-12 (CYANOCOBALAMIN) 500 MCG tablet Take 500 mcg by mouth every other day.     Tiotropium Bromide Monohydrate (SPIRIVA RESPIMAT) 1.25 MCG/ACT AERS Inhale 2 puffs into the lungs daily. (Patient not taking: Reported on 10/11/2022) 4 g 5   No current facility-administered medications on file prior to visit.    ALLERGIES: Caine-1  [lidocaine], Indomethacin, Other, Articaine-epinephrine, Gabapentin, Hydrocodone, Latex, and Lamisil [terbinafine]  SH:  married, non smoker  Review of Systems  Constitutional: Negative.   Genitourinary:        Vaginal itching    PHYSICAL EXAMINATION:    BP (!) 168/88   Pulse 78   Ht '5\' 3"'$  (1.6 m) Comment: Reported  Wt 113 lb 6.4 oz (51.4 kg)   LMP 12/14/1972   BMI 20.09 kg/m     General appearance: alert, cooperative and appears stated age Lymph:  no inguinal LAD noted  Pelvic: External genitalia:  no lesions, erythema of skin noted              Urethra:  normal appearing urethra with no masses, tenderness or lesions              Bartholins and Skenes: normal                 Vagina: normal appearing vagina with normal color and discharge, no lesions              Cervix: no lesions              Bimanual Exam:  Uterus:  uterus absent              Adnexa: no mass, fullness, tenderness   Chaperone, Octaviano Batty, CMA, was present for exam.  Assessment/Plan: 1. Vaginal itching - Cervicovaginal ancillary only( [place]) - recent antibiotic use  Total time with pt:  21 minutes.  Documentation:  3 minutes.  Total time:  24 mintues.

## 2022-11-18 DIAGNOSIS — R748 Abnormal levels of other serum enzymes: Secondary | ICD-10-CM | POA: Diagnosis not present

## 2022-11-18 DIAGNOSIS — J3489 Other specified disorders of nose and nasal sinuses: Secondary | ICD-10-CM | POA: Diagnosis not present

## 2022-11-18 DIAGNOSIS — M25552 Pain in left hip: Secondary | ICD-10-CM | POA: Diagnosis not present

## 2022-11-18 DIAGNOSIS — Z22322 Carrier or suspected carrier of Methicillin resistant Staphylococcus aureus: Secondary | ICD-10-CM | POA: Diagnosis not present

## 2022-11-18 DIAGNOSIS — M810 Age-related osteoporosis without current pathological fracture: Secondary | ICD-10-CM | POA: Diagnosis not present

## 2022-11-18 DIAGNOSIS — I10 Essential (primary) hypertension: Secondary | ICD-10-CM | POA: Diagnosis not present

## 2022-11-18 DIAGNOSIS — M5430 Sciatica, unspecified side: Secondary | ICD-10-CM | POA: Diagnosis not present

## 2022-11-18 DIAGNOSIS — R234 Changes in skin texture: Secondary | ICD-10-CM | POA: Diagnosis not present

## 2022-11-18 DIAGNOSIS — L089 Local infection of the skin and subcutaneous tissue, unspecified: Secondary | ICD-10-CM | POA: Diagnosis not present

## 2022-11-18 DIAGNOSIS — A159 Respiratory tuberculosis unspecified: Secondary | ICD-10-CM | POA: Diagnosis not present

## 2022-11-18 LAB — CERVICOVAGINAL ANCILLARY ONLY
Bacterial Vaginitis (gardnerella): NEGATIVE
Candida Glabrata: NEGATIVE
Candida Vaginitis: POSITIVE — AB
Comment: NEGATIVE
Comment: NEGATIVE
Comment: NEGATIVE

## 2022-11-21 ENCOUNTER — Ambulatory Visit (HOSPITAL_BASED_OUTPATIENT_CLINIC_OR_DEPARTMENT_OTHER)
Admission: RE | Admit: 2022-11-21 | Discharge: 2022-11-21 | Disposition: A | Discharge status: Home / Self Care | Payer: Medicare Other | Source: Ambulatory Visit | Attending: Obstetrics & Gynecology | Admitting: Obstetrics & Gynecology

## 2022-11-21 ENCOUNTER — Other Ambulatory Visit (HOSPITAL_BASED_OUTPATIENT_CLINIC_OR_DEPARTMENT_OTHER): Payer: Self-pay | Admitting: *Deleted

## 2022-11-21 DIAGNOSIS — M810 Age-related osteoporosis without current pathological fracture: Secondary | ICD-10-CM | POA: Diagnosis not present

## 2022-11-21 MED ORDER — TERCONAZOLE 0.4 % VA CREA: 1.0000 | TOPICAL_CREAM | Freq: Every day | VAGINAL | 0 refills | Status: DC

## 2022-11-24 DIAGNOSIS — M25552 Pain in left hip: Secondary | ICD-10-CM | POA: Diagnosis not present

## 2022-11-25 DIAGNOSIS — M810 Age-related osteoporosis without current pathological fracture: Secondary | ICD-10-CM | POA: Diagnosis not present

## 2022-11-25 DIAGNOSIS — I10 Essential (primary) hypertension: Secondary | ICD-10-CM | POA: Diagnosis not present

## 2022-11-25 DIAGNOSIS — G8929 Other chronic pain: Secondary | ICD-10-CM | POA: Diagnosis not present

## 2022-11-25 DIAGNOSIS — G4700 Insomnia, unspecified: Secondary | ICD-10-CM | POA: Diagnosis not present

## 2022-11-29 ENCOUNTER — Other Ambulatory Visit: Payer: Medicare Other

## 2022-12-06 ENCOUNTER — Ambulatory Visit (INDEPENDENT_AMBULATORY_CARE_PROVIDER_SITE_OTHER): Payer: Medicare Other | Admitting: Orthopaedic Surgery

## 2022-12-06 ENCOUNTER — Encounter: Payer: Self-pay | Admitting: Orthopaedic Surgery

## 2022-12-06 ENCOUNTER — Other Ambulatory Visit (INDEPENDENT_AMBULATORY_CARE_PROVIDER_SITE_OTHER): Payer: Medicare Other

## 2022-12-06 DIAGNOSIS — Z96651 Presence of right artificial knee joint: Secondary | ICD-10-CM

## 2022-12-06 NOTE — Progress Notes (Signed)
The patient is an active 77 year old female who is now 10 months status post a right total knee arthroplasty.  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  She is someone who appears at least almost 60 years younger than she truly is.  This does help her physiologically as well.  She does report a history of osteoporosis.  She says if she standing for long period time she does get some stiffness and pain in the knee but she says that her range of motion is excellent.  On my exam her knee has full range of motion and is ligamentously stable.  It has excellent alignment on her clinical exam.  2 views of left knee also showed anatomic alignment with no worrisome features of the knee replacement.  There is some slight clicking of the IT band and this certainly is normal.  She has no restrictions from my standpoint.  All questions and concerns were answered and addressed.  Follow-up is as needed.

## 2022-12-07 DIAGNOSIS — J341 Cyst and mucocele of nose and nasal sinus: Secondary | ICD-10-CM | POA: Diagnosis not present

## 2022-12-08 ENCOUNTER — Ambulatory Visit
Admission: RE | Admit: 2022-12-08 | Discharge: 2022-12-08 | Disposition: A | Discharge status: Home / Self Care | Payer: No Typology Code available for payment source | Source: Ambulatory Visit | Attending: Obstetrics and Gynecology | Admitting: Obstetrics and Gynecology

## 2022-12-08 ENCOUNTER — Other Ambulatory Visit: Payer: Self-pay | Admitting: Obstetrics and Gynecology

## 2022-12-08 DIAGNOSIS — Z111 Encounter for screening for respiratory tuberculosis: Secondary | ICD-10-CM

## 2022-12-09 DIAGNOSIS — Z23 Encounter for immunization: Secondary | ICD-10-CM | POA: Diagnosis not present

## 2022-12-12 DIAGNOSIS — G43E19 Chronic migraine with aura, intractable, without status migrainosus: Secondary | ICD-10-CM | POA: Diagnosis not present

## 2022-12-13 DIAGNOSIS — M25552 Pain in left hip: Secondary | ICD-10-CM | POA: Diagnosis not present

## 2022-12-21 ENCOUNTER — Encounter: Payer: Self-pay | Admitting: Internal Medicine

## 2022-12-21 DIAGNOSIS — H04123 Dry eye syndrome of bilateral lacrimal glands: Secondary | ICD-10-CM | POA: Diagnosis not present

## 2022-12-27 ENCOUNTER — Encounter: Payer: Self-pay | Admitting: *Deleted

## 2022-12-27 NOTE — Telephone Encounter (Addendum)
Spoke with the pt  I verified her meds  She has completed the ethambutol and the pirazynomide   Her meds are:        Albuterol Sulfate (Aero Soln) VENTOLIN HFA 108 (90 Base) MCG/ACT Inhale 2 puffs into the lungs every 6 (six) hours as needed for wheezing or shortness of breath.    amLODIPine Besylate (Tab) NORVASC 10 MG Take 10 mg by mouth daily.    Ascorbic Acid (Tab) VITAMIN C 500 MG Take 500 mg by mouth daily.    Budesonide (Cap DR Particles) ENTOCORT EC 3 MG Take 3 mg by mouth daily as needed (colitis flare).    buPROPion HCl (Tablet SR 24 hr) WELLBUTRIN XL 150 MG Take 1 tablet (150 mg total) by mouth daily.    Calcium Carb-Cholecalciferol   Take 1 tablet by mouth 2 (two) times daily.    Cholecalciferol (Tab) Vitamin D 50 MCG (2000 UT) Take 2,000 Units by mouth daily.    Cyanocobalamin (Tab) CYANOCOBALAMIN 500 MCG Take 500 mcg by mouth every other day.    Cysteine   Take 500 mg by mouth daily.    Ferrous Sulfate (Tablet Delayed Response) ferrous sulfate 325 (65 FE) MG Take 325 mg by mouth every Monday.    Finasteride (Tab) PROSCAR 5 MG Take 5 mg by mouth daily.    Fluticasone Propionate (Suspension) FLONASE 50 MCG/ACT Place 1 spray into both nostrils daily as needed for allergies.    Isoniazid (Tab) NYDRAZID 300 MG Take 300 mg by mouth daily.    Losartan Potassium (Tab) COZAAR 100 MG Take 100 mg by mouth daily.    Melatonin (Tab) melatonin 5 MG Take 5 mg by mouth at bedtime as needed (sleep).    Menaquinone-7 (Cap) Vitamin K2 100 MCG Take 100 mcg by mouth daily.    Multiple Vitamin (Tab) multivitamin  Take 1 tablet by mouth daily.    Omeprazole (Capsule Delayed Release) PRILOSEC 20 MG Take 20 mg by mouth 2 (two) times daily before a meal.    Potassium (Tab) Potassium 99 MG Take 99 mg by mouth daily.    Povidone (Solution) iVIZIA Dry Eyes 0.5 % Place 1 drop into both eyes at bedtime.    Pyridoxine HCl (Tab) VITAMIN B6 25 MG Take 25 mg by mouth daily.    rifAMPin  (Cap) RIFADIN 150 MG Take 420 mg by mouth daily.    SUMAtriptan Succinate (Tab) IMITREX 50 MG Take 50 mg by mouth every 2 (two) hours as needed for migraine.    Thiamine Mononitrate (Tab) Vitamin B-1 100 MG Take 100 mg by mouth daily.    Tiotropium Bromide Monohydrate (Aero Soln) Spiriva Respimat 1.25 MCG/ACT Inhale 2 puffs into the lungs daily.    traZODone HCl (Tab) DESYREL 50 MG Take 50 mg by mouth at bedtime.    .      Please advise, thanks

## 2022-12-27 NOTE — Telephone Encounter (Signed)
If she off tuberculosis medications?  What her current medications?  Please update the current list after talking to the patient and please send the message back   This is because most common cause of increased liver function test is medication   Current Outpatient Medications:    acetaminophen (TYLENOL) 500 MG tablet, Take 500 mg by mouth every 6 (six) hours as needed for moderate pain., Disp: , Rfl:    albuterol (VENTOLIN HFA) 108 (90 Base) MCG/ACT inhaler, Inhale 2 puffs into the lungs every 6 (six) hours as needed for wheezing or shortness of breath., Disp: 8 g, Rfl: 2   amLODipine (NORVASC) 5 MG tablet, Take 5 mg by mouth daily., Disp: , Rfl:    ascorbic acid (VITAMIN C) 500 MG tablet, Take 500 mg by mouth daily., Disp: , Rfl:    budesonide (ENTOCORT EC) 3 MG 24 hr capsule, Take 3 mg by mouth daily as needed (colitis flare)., Disp: , Rfl:    buPROPion (WELLBUTRIN XL) 150 MG 24 hr tablet, Take 1 tablet (150 mg total) by mouth daily., Disp: 90 tablet, Rfl: 4   Calcium Carb-Cholecalciferol (CALCIUM 600 + D PO), Take 1 tablet by mouth 2 (two) times daily., Disp: , Rfl:    Cholecalciferol (VITAMIN D) 50 MCG (2000 UT) tablet, Take 2,000 Units by mouth daily., Disp: , Rfl:    ethambutol (MYAMBUTOL) 400 MG tablet, Take 800 mg by mouth daily., Disp: , Rfl:    ferrous sulfate 325 (65 FE) MG EC tablet, Take 325 mg by mouth every Monday., Disp: , Rfl:    finasteride (PROSCAR) 5 MG tablet, Take 5 mg by mouth daily., Disp: , Rfl:    fluticasone (FLONASE) 50 MCG/ACT nasal spray, Place 1 spray into both nostrils daily as needed for allergies., Disp: , Rfl: 5   isoniazid (NYDRAZID) 300 MG tablet, Take 300 mg by mouth daily., Disp: , Rfl:    L-CYSTEINE PO, Take 500 mg by mouth daily., Disp: , Rfl:    losartan (COZAAR) 100 MG tablet, Take 100 mg by mouth daily., Disp: , Rfl:    Melatonin 5 MG TABS, Take 5 mg by mouth at bedtime as needed (sleep)., Disp: , Rfl:    Menaquinone-7 (VITAMIN K2) 100 MCG  CAPS, Take 100 mcg by mouth daily., Disp: , Rfl:    Multiple Vitamin (MULTIVITAMIN) tablet, Take 1 tablet by mouth daily., Disp: , Rfl:    omeprazole (PRILOSEC) 20 MG capsule, Take 20 mg by mouth 2 (two) times daily before a meal., Disp: , Rfl:    Potassium 99 MG TABS, Take 99 mg by mouth daily., Disp: , Rfl:    Povidone, PF, (IVIZIA DRY EYES) 0.5 % SOLN, Place 1 drop into both eyes at bedtime., Disp: , Rfl:    pyrazinamide 500 MG tablet, Take 1,000 mg by mouth daily., Disp: , Rfl:    pyridOXINE (VITAMIN B6) 25 MG tablet, Take 25 mg by mouth daily., Disp: , Rfl:    rifampin (RIFADIN) 150 MG capsule, Take 420 mg by mouth daily., Disp: , Rfl:    SUMAtriptan (IMITREX) 50 MG tablet, Take 50 mg by mouth every 2 (two) hours as needed for migraine., Disp: , Rfl:    terconazole (TERAZOL 7) 0.4 % vaginal cream, Place 1 applicator vaginally at bedtime. Use x 7 nights, Disp: 45 g, Rfl: 0   thiamine (VITAMIN B-1) 100 MG tablet, Take 100 mg by mouth daily., Disp: , Rfl:    Tiotropium Bromide Monohydrate (SPIRIVA RESPIMAT) 1.25 MCG/ACT  AERS, Inhale 2 puffs into the lungs daily. (Patient not taking: Reported on 10/11/2022), Disp: 4 g, Rfl: 5   traZODone (DESYREL) 50 MG tablet, Take 50 mg by mouth at bedtime., Disp: , Rfl:    vitamin B-12 (CYANOCOBALAMIN) 500 MCG tablet, Take 500 mcg by mouth every other day., Disp: , Rfl:

## 2022-12-27 NOTE — Telephone Encounter (Signed)
MR, please advise on pt's message regarding results. Thanks.

## 2022-12-27 NOTE — Telephone Encounter (Signed)
The INH could be causing the elevated liver function tests.  I did really do not know why the health department wants me to evaluate liver function tests when they are giving tuberculous drugs which are known to elevated liver function  Plan - So please check with the patient if the health department wants me to address the liver function test or the patient wants me to address the liver function test?   - In any event check liver function test, INR this week in our lab

## 2022-12-28 NOTE — Telephone Encounter (Signed)
I have received the following message   "  I sent the test results to Dr. Marchelle Gearing in order to keep him advised. I have a spit test today and blood test on Monday with health department.  Will keep  him in loop."

## 2023-01-03 NOTE — Telephone Encounter (Signed)
I spoke to Public health doctor  - clarified that patient sent results to me - initally I Was not sure why but patient later clarified it was [facility]I - Dr Maury Dus informed they do all the monitoring - I informed her I was aligned with her decisions

## 2023-01-06 DIAGNOSIS — M47816 Spondylosis without myelopathy or radiculopathy, lumbar region: Secondary | ICD-10-CM | POA: Diagnosis not present

## 2023-01-06 DIAGNOSIS — M25552 Pain in left hip: Secondary | ICD-10-CM | POA: Diagnosis not present

## 2023-01-06 DIAGNOSIS — F4321 Adjustment disorder with depressed mood: Secondary | ICD-10-CM | POA: Diagnosis not present

## 2023-01-06 DIAGNOSIS — G894 Chronic pain syndrome: Secondary | ICD-10-CM | POA: Diagnosis not present

## 2023-01-13 ENCOUNTER — Ambulatory Visit
Admission: RE | Admit: 2023-01-13 | Discharge: 2023-01-13 | Disposition: A | Discharge status: Home / Self Care | Payer: Medicare Other | Source: Ambulatory Visit | Attending: Internal Medicine | Admitting: Internal Medicine

## 2023-01-13 ENCOUNTER — Encounter: Payer: Self-pay | Admitting: Internal Medicine

## 2023-01-13 DIAGNOSIS — A159 Respiratory tuberculosis unspecified: Secondary | ICD-10-CM

## 2023-01-13 DIAGNOSIS — R053 Chronic cough: Secondary | ICD-10-CM | POA: Diagnosis not present

## 2023-01-13 DIAGNOSIS — I700 Atherosclerosis of aorta: Secondary | ICD-10-CM | POA: Diagnosis not present

## 2023-01-13 DIAGNOSIS — R918 Other nonspecific abnormal finding of lung field: Secondary | ICD-10-CM

## 2023-01-13 DIAGNOSIS — Z87891 Personal history of nicotine dependence: Secondary | ICD-10-CM | POA: Diagnosis not present

## 2023-01-13 DIAGNOSIS — I2510 Atherosclerotic heart disease of native coronary artery without angina pectoris: Secondary | ICD-10-CM | POA: Diagnosis not present

## 2023-01-13 NOTE — Telephone Encounter (Signed)
Will send to Dr. Marchelle Gearing as [place]ain Childes.

## 2023-01-16 NOTE — Telephone Encounter (Signed)
Ok thanks 

## 2023-01-17 ENCOUNTER — Ambulatory Visit (INDEPENDENT_AMBULATORY_CARE_PROVIDER_SITE_OTHER): Payer: Medicare Other | Admitting: Internal Medicine

## 2023-01-17 ENCOUNTER — Telehealth: Payer: Self-pay | Admitting: Internal Medicine

## 2023-01-17 VITALS — Ht 63.0 in | Wt 111.2 lb

## 2023-01-17 DIAGNOSIS — R053 Chronic cough: Secondary | ICD-10-CM

## 2023-01-17 DIAGNOSIS — R9389 Abnormal findings on diagnostic imaging of other specified body structures: Secondary | ICD-10-CM | POA: Diagnosis not present

## 2023-01-17 DIAGNOSIS — R918 Other nonspecific abnormal finding of lung field: Secondary | ICD-10-CM

## 2023-01-17 DIAGNOSIS — K719 Toxic liver disease, unspecified: Secondary | ICD-10-CM

## 2023-01-17 DIAGNOSIS — A159 Respiratory tuberculosis unspecified: Secondary | ICD-10-CM

## 2023-01-17 DIAGNOSIS — Z201 Contact with and (suspected) exposure to tuberculosis: Secondary | ICD-10-CM | POA: Diagnosis not present

## 2023-01-17 LAB — HEPATIC FUNCTION PANEL
ALT: 131 U/L — ABNORMAL HIGH (ref 0–35)
AST: 101 U/L — ABNORMAL HIGH (ref 0–37)
Albumin: 4.3 g/dL (ref 3.5–5.2)
Alkaline Phosphatase: 94 U/L (ref 39–117)
Bilirubin, Direct: 0.1 mg/dL (ref 0.0–0.3)
Total Bilirubin: 0.5 mg/dL (ref 0.2–1.2)
Total Protein: 6.8 g/dL (ref 6.0–8.3)

## 2023-01-17 NOTE — Addendum Note (Signed)
Addended by: Hedda Slade on: 01/17/2023 02:34 PM   Modules accepted: Orders

## 2023-01-17 NOTE — Progress Notes (Signed)
OV 08/18/2022   OV 11/09/2021  Subjective:  Patient ID: Elaine Navarro, female , DOB: [birth year] , age 77 y.o. , MRN: [id number] , ADDRESS: [address] [place] [address] PCP Ileana Ladd, MD Patient Care Team: Ileana Ladd, MD as PCP - General (Family Medicine)  This Provider for this visit: Treatment Team:  Attending Provider: Kalman Shan, MD    11/09/2021 -   Chief Complaint  Patient presents with   Consult    Pt had a CT performed which is the reason for today's visit.     HPI Elaine Navarro 77 y.o. -presents with her husband.  Concern is left lower lobe nodule 4 mm seen on CT angiogram abdomen done for renal artery aneurysm.  She sees physician at [facility] for renal artery aneurysm.  During this time she had a CT scan of the abdomen done with contrast here in [place].  This showed a 4 mm lung nodule in the left lower lobe.  According to Dr. Dorothey Baseman it is stable over 10 years.  I personally visualized this.  Some bibasilar atelectasis also reported although I am not convinced about this.  Patient herself states that overall she is stable.  She says that in July 2022 she had COVID and after that she had significant cough.  Primary care treated with cough Tessalon Perles.  The cough is now resolved for the last 5 weeks.  Currently no undue shortness of breath.  No cough no wheezing no paroxysmal nocturnal dyspnea no orthopnea.  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  She is a remote 30 pack smoker having quit in 1984 when she moved from [place] to [place].  While in [place] as a child she got exposed to her both grandfather and aunt with tuberculosis.  She says she herself was skin test positive but denies any latent TB treatment.  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  But this was just general air pollution.  She is worried about the implications of all this for her health.  She says clinically her previous primary care physician told her she might have COPD.  She also is a brother with COPD.  She wants to be sure that there is no undue health issues with her lung.  She also wants to continue doing ballroom dancing at a championship level.    CT Chest data 10/21/21   Narrative & Impression  CLINICAL DATA:  Renal artery aneurysm   EXAM: CT ANGIOGRAPHY ABDOMEN   TECHNIQUE: Multidetector CT imaging of the abdomen was performed using the standard protocol during bolus administration of intravenous contrast. Multiplanar reconstructed images and MIPs were obtained and reviewed to evaluate the vascular anatomy.   RADIATION DOSE REDUCTION: This exam was performed according to the departmental dose-optimization program which includes automated exposure control, adjustment of the mA and/or kV according to patient size and/or use of iterative reconstruction technique.   CONTRAST:  75mL OMNIPAQUE IOHEXOL 350 MG/ML SOLN   COMPARISON:  CT abdomen dated August 13, 2012   FINDINGS: VASCULAR   Aorta: Normal caliber abdominal aorta with mild calcified plaque and no significant stenosis.   Celiac: Normal caliber celiac artery with no significant atherosclerotic disease or stenosis.   SMA: Visualized SMA  is normal in caliber with no significant atherosclerotic disease or stenosis.   Renals: Peripherally calcified renal artery aneurysm arising from a lower pole segmental branch measuring 1.1 x 1.1 x 1.3 cm, unchanged in size when compared to prior exam. Bilateral main renal arteries are normal in caliber with minimal calcified plaque and no stenosis.   IMA: Visualized portion is normal in caliber with no evidence of stenosis.   Veins: No venous abnormality.   Review of the MIP images confirms the above  findings.   NON-VASCULAR   Lower chest: Stable solid 4 mm nodule of the left lower lobe located on image 13, likely benign. Bibasilar atelectasis.   Hepatobiliary: No focal liver abnormality is seen. No gallstones, gallbladder wall thickening, or biliary dilatation.   Pancreas: Unremarkable. No pancreatic ductal dilatation or surrounding inflammatory changes.   Spleen: Normal in size without focal abnormality.   Adrenals/Urinary Tract: Adrenal glands are unremarkable. Kidneys are normal, without renal calculi, focal lesion, or hydronephrosis. Bladder is unremarkable.   Stomach/Bowel: Visualized portions of the small and large bowel are unremarkable.   Lymphatic: No pathologically enlarged lymph nodes seen in the abdomen.   Other: No abdominal wall hernia or abdominal ascites.   Musculoskeletal: No acute or significant osseous findings.   IMPRESSION: 1. 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 2.  Aortic Atherosclerosis (ICD10-I70.0).     Electronically Signed   By: Allegra Lai M.D.   On: 10/22/2021 10:03       12/28/2021 Follow up : Lung nodule  Patient returns for a 75-month follow-up.  Patient was seen for pulmonary consult November 09, 2021 for abnormal CT chest that showed 4 mm lung nodule.  Patient also has some mild shortness of breath with activities.  Has a family history of TB and COPD.  She is a former smoker.  Patient was set up for a high-resolution CT chest that showed no enlarged lymph nodes.  A 0.4 cm nodule in the left lower lobe that is stable compared back to 2012.  Consistent with a benign etiology.  Clustered nodularity and consolidation in the right apex with associated parenchymal scarring as well in the left upper lobe.  Findings are consistent with possible atypical infection such as atypical Mycobacterium.  Alpha-1 testing was normal level and phenotype.  Patient says she has very minimum cough if any.  Shortness of breath is  very mild in nature.  Patient was set up for pulmonary function testing that was normal that showed FEV1 at 125%, ratio 80, FVC 117%.,  No significant bronchodilator response, DLCO 82%. She says that she gets bronchitis once or twice a year.  Usually resolves pretty easily.  She says most days she has no cough at all.  Is not limited by activities.  She did have COVID-19 in July 2022. Patient does have colitis and has issues with weight loss at times.  Says occasionally she has some swallow issues.  She denies any hemoptysis, discolored mucus, fever.   IMPRESSION: 1. Small pulmonary nodules of the dependent left lower lobe are stable on examinations dating back to at least 09/15/2010 and definitively benign. No further follow-up or characterization is indicated for these nodules. 2. Clustered centrilobular nodularity and consolidation of the medial right pulmonary apex with some associated pleuroparenchymal scarring, as well as of the peripheral posterior left upper lobe. 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 These findings are new in comparison to remote prior CT of the chest dated  09/15/2010.   Aortic Atherosclerosis (ICD10-I70.0).     Electronically Signed   By: Jearld Lesch M.D.   On: 12/17/2021 13:00     06/30/2022 Patient presents today for acute office visit. She has a history of pulmonary nodules.  She had a CT in April 2023 that showed findings atypical mycobacterium. She has had a cough since August.  Primary care prescribed rifampin, ethambutol and azithromycin recently d/t her symptoms and image findings. She developed white tongue and sore throat after taking medication for 3 days. She did notice improvement in cough while on abx. She has had no sputum cultures. She has never had a bronchoscopy. No recent chest imaging. She plans to travel to [place] on 07/09/22. No significant shortness of breath except with coughing fits.  Cough can be productive with white-yellow phlegm. She is afebrile.    Subjective:  Patient ID: Elaine Navarro, female , DOB: [birth year] , age 43 y.o. , MRN: [id number] , ADDRESS: [address] [place] [address] PCP Thana Ates, MD Patient Care Team: Thana Ates, MD as PCP - General (Internal Medicine)  This Provider for this visit: Treatment Team:  Attending Provider: Kalman Shan, MD    08/18/2022 -   Chief Complaint  Patient presents with   Follow-up    Pt states she is still coughing and also has chest congestion as well as tightness in chest. States her cough is worse in the evening and states she is coughing up white phlegm.     HPI Elaine Navarro 77 y.o. -returns for follow-up.  Presents with her husband.  I saw her earlier in the year in 2023.  After that she has had visits with the nurse practitioner.  She tells me that the cough that started in the summer 2022 after COVID still persist.  After I saw her I ordered CT scan of the chest that showed nodularity that was clustered.  Her primary care physician empirically prescribed a triple antibiotic therapy for MAI infection.  However she developed a coated tongue.  Also given the empiric nature of the diagnosis we recommended she stop it.  Which she did.  After that in October 2023 she saw a Publishing rights manager.  Sputum culture grew haemophilus influenza and Candida.  She was given Z-Pak.  She did leave for her trip to [place] on 07/09/2022 but the trip got changed because of the [place] war.  Instead she went to [place] [employer] and from there it was a [place] cruise from 07/11/2022.  She travel to the [place] of [place] in [place].  She went to [place] she went to [place] and she came back to [place] via [place] on 07/24/2022.  Shortly after arriving she got COVID-19 for the second time.  She believes she contracted this in the flight.  During the trip her cough is significantly  worse rated at 8 out of 9.  The Z-Pak that she was prescribed she took it at that time and it did help but currently she is back with residual cough at the level 5 out of 10.  Chest feels tight.  There is occasional white stuff.  Particularly sputum.  She is quite miserable with the symptoms.  She also has a runny nose for the last 5 weeks.  She wants her symptoms addressed.  Labs show normal QuantiFERON gold but no allergy workup. Alpha-1 antitrypsin is normal. She is a former 30 pack smoking history.   Of note for this  visit I personally visualized the CT scan with the previous CT scan from April 2023.     OV 10/11/2022  Subjective:  Patient ID: Elaine Navarro, female , DOB: [birth year] , age 28 y.o. , MRN: [id number] , ADDRESS: [address] [place] [facility] [address] PCP Thana Ates, MD Patient Care Team: Thana Ates, MD as PCP - General (Internal Medicine)  This Provider for this visit: Treatment Team:  Attending Provider: Kalman Shan, MD    10/11/2022 -   Chief Complaint  Patient presents with   Follow-up    Cough is improving. She has not started her spiriva due to high cost. Her cough is occ prod with white sputum. Appetite is good. No fevers, sweats.     HPI Elaine Navarro 77 y.o. -returns for follow-up.  At this point in time under the coordination of Department of Public Health she is completed 2 weeks of Mycobacterium tuberculosis treatment and date labs of therapy.  She already states that her cough is improved significantly her chest tightness is improved significantly although residual symptoms are still there.  She is feeling better she is tolerating the antituberculous treatment well.  She is no longer considered contagious and has been allowed to go in public according to her history.  Her husband is with her but he is not providing any independent history today.  The test sputum culture was done by nurse practitioner in October 2023.  Right  prior to starting the antituberculous treatment we did a CT scan of the chest and a pulmonary right upper lobe nodules were actually getting worse.  A bronchoscopy was done early January 2024 and the nucleocapsid amplification of Mycobacterium tuberculosis negative but the culture is still pending and so far no growth.  Nevertheless she is feeling better.  Of note she had some mild emphysema on the CT scan we ordered Spiriva but it is too expensive and she is feeling better so we told her to hold off.   Her current response to typical treatment and terms of cough is profile below     CT cest 09/21/22   Narrative & Impression  CLINICAL DATA:  77 year old female presents for evaluation of chronic productive cough.   EXAM: CT CHEST WITHOUT CONTRAST   TECHNIQUE: Multidetector CT imaging of the chest was performed following the standard protocol without IV contrast.   RADIATION DOSE REDUCTION: This exam was performed according to the departmental dose-optimization program which includes automated exposure control, adjustment of the mA and/or kV according to patient size and/or use of iterative reconstruction technique.   COMPARISON:  December 17, 2021   FINDINGS: Cardiovascular: Calcified aortic atherosclerosis is mild. Normal caliber of the thoracic aorta. Normal heart size. Scattered coronary artery calcifications. Normal caliber of central pulmonary vasculature.   Mediastinum/Nodes: LEFT thyroid nodule up to 2.3 cm with areas of calcification. No adenopathy in the chest. Esophagus is grossly normal by CT.   Lungs/Pleura: Biapical scarring. More confluent area of nodularity in the RIGHT upper lobe has increased considerably since previous imaging 3.0 x 1.3 cm. Previously this was confined to an area along the medial and lateral surface of the apical pleural surfaces.   There are diffuse areas of nodularity in the RIGHT upper lobe metabolic so increased, some areas new since  previous imaging. Juxtapleural nodule in the anterior RIGHT upper lobe (image 36/5) 8 mm, this is a new finding. Grouped nodules in the RIGHT upper lobe and some tree in bud on images  36 through 41 are also new.   Some improvement with respect to bronchovascular nodularity extending towards the RIGHT lung apex that was seen in the medial RIGHT upper lobe on the previous study. No signs of additional consolidative process or evidence of pleural effusion. Mild LEFT apical scarring.   Stable LEFT lower lobe nodule (image 132/5) 4 mm. Airways are patent. No consolidation or sign of pleural effusion.   Upper Abdomen: No acute upper abdominal findings to the extent evaluated.   Musculoskeletal: No chest wall mass.  Spinal degenerative changes.   IMPRESSION: 1. Resolution of some areas of nodularity with worsening of peripheral areas of nodularity in the RIGHT upper lobe. Findings favor sequela of infection perhaps MAI or atypical infection. 2. More confluent area of nodularity also having developed since previous imaging. Would suggest short interval follow-up to ensure that this area shows improvement and does not show persistent underlying masslike area or nodule. Would also consider correlation with respiratory symptoms and pulmonology consultation if not yet obtained. 3. LEFT thyroid nodule up to 2.3 cm with areas of calcification. Suggest follow-up thyroid ultrasound if not yet obtained. 4. Aortic atherosclerosis and coronary artery disease.   Aortic Atherosclerosis (ICD10-I70.0).     Electronically Signed   By: Donzetta Kohut M.D.   On: 09/22/2022 14:09     OV 01/17/2023  Subjective:  Patient ID: Elaine Navarro, female , DOB: 07/03/1946 , age 62 y.o. , MRN: [id number] , ADDRESS: [address] [place] [address] PCP Thana Ates, MD Patient Care Team: Thana Ates, MD as PCP - General (Internal Medicine)  This Provider for this visit: Treatment Team:   Attending Provider: Kalman Shan, MD  #Former heavy smoker 30 pack - #Right upper lobe nodule April 2023 worse in January 2024  -Sputum culture + October 2023 for Mycobacterium tuberculosis and also haemophilus  -Multiple QuantiFERON gold negative  -exposed to tuberculosis as an infant from her grandfather in the 1940s  01/17/2023 -   Chief Complaint  Patient presents with   Follow-up    CT F/up     HPI Elaine Navarro 77 y.o. -returns for follow-up.  Presents with her husband was an independent historian.  She now reports that her cough is even better with continued antituberculous treatment but beginning December 20, 2022 while on antituberculous treatment with close monitoring by the Department of Public Health she started having transaminitis.  Her AST went up to 84 and ALT went up to 108.  Then on Jan 13, 2023 AST was 114 and ALT was 144 and then on Jan 15, 2023 the antituberculous treatment was stopped.  She is not having any jaundice or discolored urine.  She has gained a little weight.  She says the cough is actually better.  She says she is getting good support from the [employer].  However she is worried about drug-induced liver injury.  Husband states she is really worried about this.  She is reading things on the Internet.  She says the public health department might switch her to another antituberculous treatment.  They are also requesting a right upper quadrant ultrasound.  She had CT scan of the chest May 2025 personally visualized it.  I am not fully sure if she is significantly improved or not compared to a year ago.  I asked for repeat read.  I did show her the CT report.  I also showed her the CT images.  In addition she did have  some vaginal yeast infection a month ago and took Diflucan pessary but now the vaginal itch is back.  Started having some pain in the right low back for the last 1 week.  She is got some dry mouth for the last 1 month.  She will  discuss this with primary care physician.    Dr Gretta Cool Reflux Symptom Index (> 13-15 suggestive of LPR cough) Pre RX  - ATb Rx Post start ATbx Rx - 2 weeks  Hoarseness of problem with voice 5 2  Clearing  Of Throat 5 3  Excess throat mucus or feeling of post nasal drip 5 3  Difficulty swallowing food, liquid or tablets 3 2  Cough after eating or lying down 5 3  Breathing difficulties or choking episodes 4 2  Troublesome or annoying cough 5 3  Sensation of something sticking in throat or lump in throat 5 2  Heartburn, chest pain, indigestion, or stomach acid coming up 3 2  TOTAL 40 21    PFT     Latest Ref Rng & Units 12/28/2021   10:42 AM  PFT Results  FVC-Pre L 3.18   FVC-Predicted Pre % 121   FVC-Post L 3.07   FVC-Predicted Post % 117   Pre FEV1/FVC % % 72   Post FEV1/FCV % % 80   FEV1-Pre L 2.29   FEV1-Predicted Pre % 117   FEV1-Post L 2.44   DLCO uncorrected ml/min/mmHg 15.17   DLCO UNC% % 82   DLCO corrected ml/min/mmHg 15.17   DLCO COR %Predicted % 82   DLVA Predicted % 72   TLC L 4.40   TLC % Predicted % 89   RV % Predicted % 64     CT chest 01/13/23  Narrative & Impression  CLINICAL DATA:  Chronic cough, former smoker   EXAM: CT CHEST WITHOUT CONTRAST   TECHNIQUE: Multidetector CT imaging of the chest was performed following the standard protocol without IV contrast.   RADIATION DOSE REDUCTION: This exam was performed according to the departmental dose-optimization program which includes automated exposure control, adjustment of the mA and/or kV according to patient size and/or use of iterative reconstruction technique.   COMPARISON:  CT chest, 09/21/2022 thyroid ultrasound, 05/12/2022   FINDINGS: Cardiovascular: Aortic atherosclerosis. Normal heart size. Left and right coronary artery calcifications. No pericardial effusion.   Mediastinum/Nodes: No enlarged mediastinal, hilar, or axillary lymph nodes. Thyroid gland, trachea, and esophagus  demonstrate no significant findings.   Lungs/Pleura: No significant change in an area of consolidation and volume loss of the right pulmonary apex (series 8, image 5). Adjacent clustered nodularity and small consolidations of overall improved, however are fluctuant, with a new nodule medially measuring 0.5 cm (series 8, image 22). Additional, occasional small nodules and ground-glass opacities elsewhere, for example a 0.4 cm nodule of the dependent left lower lobe, are unchanged (series 8, image 124). No pleural effusion or pneumothorax.   Upper Abdomen: No acute abnormality. Densely rim calcified aneurysm of the right renal artery or a branch arteriole measuring 1.3 x 1.2 cm (series 2, image 156).   Musculoskeletal: No chest wall abnormality. No acute osseous findings.   IMPRESSION: 1. No significant change in an area of consolidation and volume loss of the right pulmonary apex. Adjacent clustered nodularity and small consolidations of overall improved, however fluctuant. Findings are consistent with ongoing atypical infection and chronic underlying sequelae, particularly atypical Mycobacterium. 2. Additional, occasional small nodules and ground-glass opacities elsewhere unchanged. 3. Incidental note of a  densely rim calcified aneurysm of the right renal artery or a branch arteriole measuring 1.3 x 1.2 cm. 4. Coronary artery disease.   Aortic Atherosclerosis (ICD10-I70.0).     Electronically Signed   By: Jearld Lesch M.D.   On: 01/17/2023 12:18     has a past medical history of Aneurysm of renal artery (HCC), Fracture of right wrist (2011), GERD (gastroesophageal reflux disease), Hypertension, Left bundle branch block, Left thyroid nodule (2003), Lymphocytic colitis (03/2013), Migraines, Osteoporosis, PONV (postoperative nausea and vomiting), Sciatica, and Visceral injury.   reports that she quit smoking about 40 years ago. Her smoking use included cigarettes. She has a  30.00 pack-year smoking history. She has never used smokeless tobacco.  Past Surgical History:  Procedure Laterality Date   ABDOMINOPLASTY  2010   APPENDECTOMY  1962   BREAST EXCISIONAL BIOPSY Bilateral 2001   x 3   BRONCHIAL WASHINGS  09/16/2022   Procedure: BRONCHIAL WASHINGS;  Surgeon: Kalman Shan, MD;  Location: [facility] END[facility]COPY;  Service: Endoscopy;;   CATARACT EXTRACTION, BILATERAL Bilateral 01/2020   Dental implants  1993   HEMORRHOID SURGERY  2000   JOINT REPLACEMENT     KNEE ARTHROSCOPY W/ DEBRIDEMENT Right 07/2020   Dr. Luiz Blare   TOTAL KNEE ARTHROPLASTY Right 01/21/2022   Procedure: RIGHT TOTAL KNEE ARTHROPLASTY;  Surgeon: Kathryne Hitch, MD;  Location: [facility] ORS;  Service: Orthopedics;  Laterality: Right;  RFNA   VAGINAL HYSTERECTOMY  1974   Dysfunctional uterine bleeding.   VIDEO BRONCHOSCOPY N/A 09/16/2022   Procedure: VIDEO BRONCHOSCOPY WITHOUT FLUORO;  Surgeon: Kalman Shan, MD;  [facility];  Service: Endoscopy;  Laterality: N/A;    Allergies  Allergen Reactions   Caine-1 [Lidocaine] Swelling    Allergic to Septocaine Articaine HCL 4% w/epi   Indomethacin Swelling   Other Diarrhea    Dairy Products    Articaine-Epinephrine Other (See Comments)   Gabapentin     "Twitches"   Hydrocodone Nausea Only   Latex Swelling    Possible allergy   Lamisil [Terbinafine] Rash    Immunization History  Administered Date(s) Administered   Fluad Quad(high Dose 65+) 05/29/2022   Hepatitis A 03/10/2003, 10/15/2003   Hepatitis B 04/15/2003, 05/21/2003, 10/15/2003   Influenza Split 06/09/2014, 06/08/2015, 06/08/2017, 06/19/2018, 05/27/2019, 05/18/2020, 05/31/2021   Influenza, High Dose Seasonal PF 06/19/2018   Influenza,inj,Quad PF,6+ Mos 06/13/2011   Influenza-Unspecified 05/16/2013   PFIZER(Purple Top)SARS-COV-2 Vaccination 10/01/2019, 10/21/2019, 05/07/2020, 12/15/2020, 05/31/2021, 06/01/2022   Pneumococcal Conjugate-13 10/18/2013   Pneumococcal  Polysaccharide-23 06/22/2010   Respiratory Syncytial Virus Vaccine,Recomb Aduvanted(Arexvy) 05/11/2022   Td 03/10/2003   Tdap 11/08/2010, 12/29/2019, 01/11/2020   Typhoid Inactivated 03/10/2003, 07/05/2013   Yellow Fever 07/05/2013   Zoster, Live 01/19/2006, 12/21/2016, 06/26/2017    Family History  Problem Relation Age of Onset   Hypertension Mother    Cancer Sister        melanoma   Autoimmune disease Sister        crest and sjor   Breast cancer Daughter 4   Lupus Brother      Current Outpatient Medications:    albuterol (VENTOLIN HFA) 108 (90 Base) MCG/ACT inhaler, Inhale 2 puffs into the lungs every 6 (six) hours as needed for wheezing or shortness of breath., Disp: 8 g, Rfl: 2   amLODipine (NORVASC) 10 MG tablet, Take 20 mg by mouth daily., Disp: , Rfl:    ascorbic acid (VITAMIN C) 500 MG tablet, Take 500 mg by mouth daily., Disp: , Rfl:  budesonide (ENTOCORT EC) 3 MG 24 hr capsule, Take 3 mg by mouth daily as needed (colitis flare)., Disp: , Rfl:    buPROPion (WELLBUTRIN XL) 150 MG 24 hr tablet, Take 1 tablet (150 mg total) by mouth daily., Disp: 90 tablet, Rfl: 4   Calcium Carb-Cholecalciferol (CALCIUM 600 + D PO), Take 1 tablet by mouth 2 (two) times daily., Disp: , Rfl:    Cholecalciferol (VITAMIN D) 50 MCG (2000 UT) tablet, Take 2,000 Units by mouth daily., Disp: , Rfl:    ferrous sulfate 325 (65 FE) MG EC tablet, Take 325 mg by mouth every Monday., Disp: , Rfl:    finasteride (PROSCAR) 5 MG tablet, Take 5 mg by mouth daily., Disp: , Rfl:    fluticasone (FLONASE) 50 MCG/ACT nasal spray, Place 1 spray into both nostrils daily as needed for allergies., Disp: , Rfl: 5   L-CYSTEINE PO, Take 500 mg by mouth daily., Disp: , Rfl:    losartan (COZAAR) 100 MG tablet, Take 100 mg by mouth daily., Disp: , Rfl:    Melatonin 5 MG TABS, Take 5 mg by mouth at bedtime as needed (sleep)., Disp: , Rfl:    Menaquinone-7 (VITAMIN K2) 100 MCG CAPS, Take 100 mcg by mouth daily., Disp: ,  Rfl:    Multiple Vitamin (MULTIVITAMIN) tablet, Take 1 tablet by mouth daily., Disp: , Rfl:    omeprazole (PRILOSEC) 20 MG capsule, Take 20 mg by mouth 2 (two) times daily before a meal., Disp: , Rfl:    Potassium 99 MG TABS, Take 99 mg by mouth daily., Disp: , Rfl:    Povidone, PF, (IVIZIA DRY EYES) 0.5 % SOLN, Place 1 drop into both eyes at bedtime., Disp: , Rfl:    pyridOXINE (VITAMIN B6) 25 MG tablet, Take 25 mg by mouth daily., Disp: , Rfl:    rifampin (RIFADIN) 150 MG capsule, Take 420 mg by mouth daily., Disp: , Rfl:    SUMAtriptan (IMITREX) 50 MG tablet, Take 50 mg by mouth every 2 (two) hours as needed for migraine., Disp: , Rfl:    thiamine (VITAMIN B-1) 100 MG tablet, Take 100 mg by mouth daily., Disp: , Rfl:    Tiotropium Bromide Monohydrate (SPIRIVA RESPIMAT) 1.25 MCG/ACT AERS, Inhale 2 puffs into the lungs daily., Disp: 4 g, Rfl: 5   traZODone (DESYREL) 50 MG tablet, Take 50 mg by mouth at bedtime., Disp: , Rfl:    vitamin B-12 (CYANOCOBALAMIN) 500 MCG tablet, Take 500 mcg by mouth every other day., Disp: , Rfl:    isoniazid (NYDRAZID) 300 MG tablet, Take 300 mg by mouth daily. (Patient not taking: Reported on 01/17/2023), Disp: , Rfl:       Objective:   Vitals:   01/17/23 1408  Weight: 111 lb 3.2 oz (50.4 kg)  Height: 5\' 3"  (1.6 m)    Estimated body mass index is 19.7 kg/m as calculated from the following:   Height as of this encounter: 5\' 3"  (1.6 m).   Weight as of this encounter: 111 lb 3.2 oz (50.4 kg).  @WEIGHTCHANGE @  [employer]   01/17/23 1408  Weight: 111 lb 3.2 oz (50.4 kg)     Physical Exam  General: No distress. Looks well. THIN Neuro: Alert and Oriented x 3. GCS 15. Speech normal Psych: Pleasant Resp:  Barrel Chest - no.  Wheeze - no, Crackles - no, No overt respiratory distress CVS: Normal heart sounds. Murmurs - no Ext: Stigmata of Connective Tissue Disease - no HEENT: Normal upper airway.  PEERL +. No post nasal drip        Assessment:        ICD-10-CM   1. Exposure to TB  Z20.1 Hepatic function panel    2. MTB (Mycobacterium tuberculosis infection)  A15.9     3. Chronic cough  R05.3     4. Lung nodules  R91.8     5. Abnormal CT of the chest  R93.89     6. 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 Exposure to TB  Z20.1 Hepatic function panel    2. MTB (Mycobacterium tuberculosis infection)  A15.9     3. Chronic cough  R05.3     4. Lung nodules  R91.8     5. Abnormal CT of the chest  R93.89     6. 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         -Glad you are feeling better and significantly improved cough after starting Mycobacterium tuberculosis treatment  in January 2024 [based on sputum culture result October 2023 coordinated by the Department of Public Health  [facility]  - a) puzzling  tuberculosis culture from bronchoscopy 09/16/2022 pre treatment is negative and  B) puzzling Quantiferon gold Is negative C) puzzling CT chest does not appear much improved in May 2024 compared to 1 year ago  - No also dealing with drug-induced liver injuryw   Plan -RE-Refer infectious disease consultation to evaluate if you truly had Mycobacterium tuberculosis or not -Check liver function test today - Check right upper quadrant ultrasound -Will discuss in multidisciplinary thoracic case conference over the next 2 months  Follow-up - Return in 3 months to see Dr. Marchelle Gearing   -RSI cough score at follow-up    SIGNATURE    Dr. Kalman Shan, M.D., F.C.C.P,  Pulmonary and Critical Care Medicine Staff Physician, [facility] Center Director - Interstitial Lung Disease  Program  Pulmonary Fibrosis [facility] at [facility] [place], [place], [address]  Pager: [phone], If no answer or between  15:00h - 7:00h: call [phone] Telephone: [phone]  2:27 PM 01/17/2023

## 2023-01-17 NOTE — Telephone Encounter (Signed)
  For radiology addendum:   - please call radiologist assistant line [phone] and specify Elaine Navarro , 1946-05-02 and [id number] - mention imaging type CT chest  and date May 2024 - request addendum for purpose of compraesion to 2012 and also early 2023. Current read is comparing only to early 2024. Patient is on MTb Rx and not sure she has responded   Please send phone message back when done  Thanks    SIGNATURE    Dr. Kalman Shan, M.D., F.C.C.P,  Pulmonary and Critical Care Medicine Staff Physician, [facility] Center Director - Interstitial Lung Disease  Program  Pulmonary Fibrosis [facility] at [facility] [place], [place], [address]  Pager: [phone], If no answer  OR between  19:00-7:00h: page [phone] Telephone (clinical office): [phone] Telephone (research): [phone]  1:59 PM 01/17/2023

## 2023-01-17 NOTE — Addendum Note (Signed)
Addended by: Hedda Slade on: 01/17/2023 02:40 PM   Modules accepted: Orders

## 2023-01-17 NOTE — Patient Instructions (Addendum)
ICD-10-CM   1. Exposure to TB  Z20.1 Hepatic function panel    2. MTB (Mycobacterium tuberculosis infection)  A15.9     3. Chronic cough  R05.3     4. Lung nodules  R91.8     5. Abnormal CT of the chest  R93.89     6. 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         -Glad you are feeling better and significantly improved cough after starting Mycobacterium tuberculosis treatment  in January 2024 [based on sputum culture result October 2023 coordinated by the Department of Public Health  [facility]  - a) puzzling  tuberculosis culture from bronchoscopy 09/16/2022 pre treatment is negative and  B) puzzling Quantiferon gold Is negative C) puzzling CT chest does not appear much improved in May 2024 compared to 1 year ago  - No also dealing with drug-induced liver injuryw   Plan -RE-Refer infectious disease consultation to evaluate if you truly had Mycobacterium tuberculosis or not -Check liver function test today - Check right upper quadrant ultrasound -Will discuss in multidisciplinary thoracic case conference over the next 2 months  Follow-up - Return in 3 months to see Dr. Marchelle Gearing   -RSI cough score at follow-up

## 2023-01-19 NOTE — Telephone Encounter (Signed)
   Thanks   Re LFT   May 3rd was 114/144  So is improving off TB drugs  Good news but she needs continued monitoring of the LFT with public health dept  And let her know I am going to talk in confernce  But also pls see if our ID dept can see her. - needs re-referral  Recent Labs  Lab 01/17/23 1444  AST 101*  ALT 131*  ALKPHOS 94  BILITOT 0.5  PROT 6.8  ALBUMIN 4.3

## 2023-01-19 NOTE — Telephone Encounter (Signed)
Called [facility] and spoke with Arline Asp letting her know the addendum that Dr. Marchelle Gearing was wanting to have done and she said she would get it over to the radiologist for review.  Routing back to Dr. Marchelle Gearing.

## 2023-01-23 NOTE — Telephone Encounter (Signed)
Called and spoke with pt about the info per Dr. Marchelle Gearing. Pt said she does have an appt scheduled with ID.  Pt said she just went to the health dept today and they took a lot of blood which she said when the results came back, she would bring a copy of the results to our office for Dr. Marchelle Gearing to be able to view. Nothing further needed.

## 2023-01-24 DIAGNOSIS — E041 Nontoxic single thyroid nodule: Secondary | ICD-10-CM | POA: Diagnosis not present

## 2023-01-24 DIAGNOSIS — J341 Cyst and mucocele of nose and nasal sinus: Secondary | ICD-10-CM | POA: Diagnosis not present

## 2023-01-24 DIAGNOSIS — J3489 Other specified disorders of nose and nasal sinuses: Secondary | ICD-10-CM | POA: Diagnosis not present

## 2023-01-26 ENCOUNTER — Encounter (HOSPITAL_BASED_OUTPATIENT_CLINIC_OR_DEPARTMENT_OTHER): Payer: Self-pay | Admitting: *Deleted

## 2023-01-27 ENCOUNTER — Encounter: Payer: Self-pay | Admitting: Internal Medicine

## 2023-01-27 ENCOUNTER — Ambulatory Visit (INDEPENDENT_AMBULATORY_CARE_PROVIDER_SITE_OTHER): Payer: Medicare Other | Admitting: Internal Medicine

## 2023-01-27 ENCOUNTER — Other Ambulatory Visit: Payer: Self-pay

## 2023-01-27 VITALS — BP 131/78 | HR 70 | Temp 98.1°F | Ht 63.0 in | Wt 110.0 lb

## 2023-01-27 DIAGNOSIS — A150 Tuberculosis of lung: Secondary | ICD-10-CM

## 2023-01-27 DIAGNOSIS — R7401 Elevation of levels of liver transaminase levels: Secondary | ICD-10-CM | POA: Diagnosis not present

## 2023-01-27 NOTE — Progress Notes (Addendum)
[facility]  Reason for Consult:pulmonary Referring Provider: Ramaswamy    Patient Active Problem List   Diagnosis Date Noted   Chronic cough 09/16/2022   Right lower lobe pulmonary infiltrate 09/16/2022   Abnormal CT of the chest 06/30/2022   Status post right knee replacement 01/21/2022   Arthritis of right knee 01/20/2022   Lung nodules 12/28/2021   Primary hypertension 05/13/2021   Thyroid nodule 05/13/2021   History of depression 05/13/2021   H/O: hysterectomy 05/13/2021   Postmenopausal 05/13/2021   Piriformis syndrome of left side 04/07/2021   Chronic pain of right knee 03/04/2021   H/O sciatica 11/04/2020   Age-related osteoporosis without current pathological fracture 11/04/2020   Vitamin D insufficiency 12/20/2019   Actinic keratosis 03/21/2017   Focal lymphocytic colitis 05/29/2013   Aneurysm artery, 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 female copd, dx'ed pulm tb here for same   Reviewed chart, spoke with [place] tb department, and patient  She has an out of system 06/2022 afb cx that grew TB (no sensitivity available) Her pcp treated her as MAI rif/etham/macrolid briefly  She saw pulm who performed bal 09/2022 and afb cx negative  However, case referred to ID who was awared that state lab was to see her --> started treatment middle 09/2022  I spoke with state today. She is on continuation phase with rif/inh. However tx on hold 01/15/2023 due to transaminitis. State intends to finish treatment   Somewhere she had the confusion that she doesn't have TB and that's why she was referred here   She had no b sx She had significant cough by 09/2022 but within a couple weeks of treatment the cough is significantly reduced/gone    Review of Systems: ROS  All other ros negative     Past Medical History:  Diagnosis Date    Aneurysm of renal artery (HCC)    followed at [facility], stable with CT/angiogram 10/2021   Fracture of right wrist 2011   Fell in bathroom.   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    Social History   Tobacco Use   Smoking status: Former    Packs/day: 3.00    Years: 10.00    Additional pack years: 0.00    Total pack years: 30.00    Types: Cigarettes    Quit date: 12/15/1982    Years since quitting: 40.1   Smokeless tobacco: Never  Vaping Use   Vaping Use: Never used  Substance Use Topics   Alcohol use: Yes    Alcohol/week: 7.0 standard drinks of alcohol    Types: 7 Standard drinks or equivalent per week   Drug use: No    Family History  Problem Relation Age of Onset   Hypertension Mother    Cancer Sister        melanoma   Autoimmune disease Sister        crest and sjor   Breast cancer Daughter 60   Lupus Brother     Allergies  Allergen Reactions   Caine-1 [Lidocaine] Swelling    Allergic to Septocaine Articaine HCL 4% w/epi   Indomethacin Swelling   Other Diarrhea    Dairy Products    Articaine-Epinephrine Other (See Comments)   Gabapentin     "Twitches"   Hydrocodone Nausea Only   Latex Swelling    Possible allergy   Lamisil [Terbinafine] Rash    OBJECTIVE: Vitals:   01/27/23 0848  BP: 131/78  Pulse: 70  Temp: 98.1 F (36.7 C)  TempSrc: Temporal  SpO2: 100%  Weight: 110 lb (49.9 kg)  Height: 5\' 3"  (1.6 m)   Body mass index is 19.49 kg/m.   Physical Exam General/constitutional: no distress, pleasant HEENT: Normocephalic, PER, Conj Clear, EOMI, Oropharynx clear Neck supple CV: rrr no mrg Lungs: clear to auscultation, normal respiratory effort Abd: Soft, Nontender Ext: no edema Skin:  No Rash Neuro: nonfocal MSK: no peripheral joint swelling/tenderness/warmth; back spines nontender    Lab:  Microbiology: 06/2022 afb sputum mtb 09/2022 bal afb sputum ngtd  Serology:  Imaging:   Assessment/plan: Problem List Items Addressed This Visit   None     After discussion with state tb department, we have cleared up the confusion for patient  She'll continue to be followed by them and finish her pulm tb treatment  She doesn't need to follow up with [place] unless state lab refers her here  I have also notified our inpatient IP team dr Drue Second and her pulmonologist dr Phillis Knack    I have spent a total of 65 minutes of face-to-face and non-face-to-face time, excluding clinical staff time, preparing to see patient, ordering tests and/or medications, and provide counseling the patient   ------------ Addendum: I received some records from state tb department: Treatment started 09/21/2022 ([facility]) Has been on consolidation with [place] Lft rising to 150s and tx hold 5/06  5/02 cbc 4.7/14/237; lft 114/144/94/0.4 (ast/alt/alkphos/tbili) 5/14 lft 90/123/87/0.5   Cultures: 1/02 afb sputum -- few mtb (S inh mic 0.1, rifampin mic 1, ethambutol mic 5; pza not done as not available currently) 2/14 sputum cx -- few mtb 4/03 sputum cx -- ngtd 4/04 sputum cx -- ngtd 4/17 sputum cx -- ngtd 4/18 sputum cx -- ngtd     Follow-up: No follow-ups on file.  Raymondo Band, MD [facility] 01/27/2023, 9:06 AM

## 2023-01-27 NOTE — Patient Instructions (Signed)
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

## 2023-01-30 DIAGNOSIS — H02882 Meibomian gland dysfunction right lower eyelid: Secondary | ICD-10-CM | POA: Diagnosis not present

## 2023-01-30 DIAGNOSIS — H02885 Meibomian gland dysfunction left lower eyelid: Secondary | ICD-10-CM | POA: Diagnosis not present

## 2023-02-04 NOTE — Progress Notes (Unsigned)
Cardiology Office Note:    Date:  02/07/2023   ID:  Elaine Navarro, DOB 12-Jun-1946, MRN [id number]  PCP:  Thana Ates, MD   [facility] Providers Cardiologist:  Dietrich Pates, MD     Referring MD: Thana Ates, MD   Chief Complaint: shortness of breath  History of Present Illness:    Elaine Navarro is a very pleasant 77 y.o. female with a hx of  hypertension, LBBB, renal artery aneurysm, aortic atherosclerosis and coronary artery disease on CT, and pulmonary TB.   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 While in [place] as a child, was exposed to both her grandfather and her aunt with tuberculosis. Remote smoking history, quit in 1984.  Referred to cardiology for evaluation prior to right knee surgery and seen by Dr. Tenny Craw on 01/18/2022. She had newly identified LBBB on EKG. No prior cardiac history.  Up until a few weeks prior she was very active, dancing competitively. She was feeling well with no concerning cardiac symptoms and was felt low risk for upcoming surgery.  She was advised to follow-up in 6 to 9 months.  She was diagnosed with Mycobacterium TB and treatment started 09/21/22 (RIZE) based on sputum culture from bronchoscopy 09/2022.  QuantiFERON gold was negative. Has been on consolidation with RI. Negative sputum cultures April.   Today, she is here alone for follow-up. Reports she has been concerned about elevation in BP and has noticed more shortness of breath with activity recently. Continues to dance competively, just won the [employer] in March. Noting more shortness of breath with dancing and while playing golf this past weekend. She was walking the golf course and noted particular difficulty with inclines. BP has improved with increase of amlodipine to 10 mg 6-8 weeks ago. Home BP consistently 140s over 80s mmHg. She denies orthopnea, PND, edema, chest pain, presyncope, syncope, and palpitations.  Is concerned about aortic  atherosclerosis and coronary calcifications noted on recent CT ordered by pulmonology.  Continues to undergo [facility] with [facility]. Had recent elevation in liver enzymes and medications were discontinued and now are slowly being resumed under direction of health dept. Dr. Marchelle Gearing has scheduled a liver ultrasound for next week. Eats a healthy diet, she is a vegetarian and avoids dairy.  Past Medical History:  Diagnosis Date   Aneurysm of renal artery (HCC)    followed at [facility], stable with CT/angiogram 10/2021   Fracture of right wrist 2011   Fell in bathroom.   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    Past Surgical History:  Procedure Laterality Date   ABDOMINOPLASTY  2010   APPENDECTOMY  1962   BREAST EXCISIONAL BIOPSY Bilateral 2001   x 3   BRONCHIAL WASHINGS  09/16/2022   Procedure: BRONCHIAL WASHINGS;  Surgeon: Kalman Shan, MD;  Location: [facility] ENDOSCOPY;  Service: Endoscopy;;   CATARACT EXTRACTION, BILATERAL Bilateral 01/2020   Dental implants  1993   HEMORRHOID SURGERY  2000   JOINT REPLACEMENT     KNEE ARTHROSCOPY W/ DEBRIDEMENT Right 07/2020   Dr. Luiz Blare   TOTAL KNEE ARTHROPLASTY Right  01/21/2022   Procedure: RIGHT TOTAL KNEE ARTHROPLASTY;  Surgeon: Kathryne Hitch, MD;  Location: [facility] ORS;  Service: Orthopedics;  Laterality: Right;  RFNA   VAGINAL HYSTERECTOMY  1974   Dysfunctional uterine bleeding.   VIDEO BRONCHOSCOPY N/A 09/16/2022   Procedure: VIDEO BRONCHOSCOPY WITHOUT FLUORO;  Surgeon: Kalman Shan, MD;  Location: [facility] ENDOSCOPY;  Service: Endoscopy;  Laterality: N/A;    Current Medications: Current Meds  Medication Sig   albuterol (VENTOLIN HFA) 108 (90 Base) MCG/ACT  inhaler Inhale 2 puffs into the lungs every 6 (six) hours as needed for wheezing or shortness of breath.   amLODipine (NORVASC) 10 MG tablet Take 10 mg by mouth daily.   amoxicillin (AMOXIL) 500 MG tablet SMARTSIG:4 Tablet(s) By Mouth   ascorbic acid (VITAMIN C) 500 MG tablet Take 500 mg by mouth daily.   botulinum toxin Type A (BOTOX) 100 units SOLR injection Inject into the skin.   budesonide (ENTOCORT EC) 3 MG 24 hr capsule Take 3 mg by mouth daily as needed (colitis flare).   buPROPion (WELLBUTRIN XL) 150 MG 24 hr tablet Take 1 tablet (150 mg total) by mouth daily.   chlorhexidine (PERIDEX) 0.12 % solution SMARTSIG:By Mouth   Cholecalciferol (VITAMIN D) 50 MCG (2000 UT) tablet Take 2,000 Units by mouth daily.   clobetasol cream (TEMOVATE) 0.05 % APPLY TOPICALLY TO THE AFFECTED AREA TWICE DAILY. STOP WHEN SMOOTH. AVOID FACE AND BODY FOLDS   ferrous sulfate 325 (65 FE) MG EC tablet Take 325 mg by mouth every Monday.   finasteride (PROSCAR) 5 MG tablet Take 5 mg by mouth daily.   fluticasone (FLONASE) 50 MCG/ACT nasal spray Place 1 spray into both nostrils daily as needed for allergies.   isoniazid (NYDRAZID) 300 MG tablet Take 300 mg by mouth daily.   L-CYSTEINE PO Take 500 mg by mouth daily.   Melatonin 5 MG TABS Take 5 mg by mouth at bedtime as needed (sleep).   Menaquinone-7 (VITAMIN K2) 100 MCG CAPS Take 100 mcg by mouth daily.   metoprolol tartrate (LOPRESSOR) 50 MG tablet Take one (1) tablet by mouth ( 50 mg) tablet 2 hours prior to CT scan.   Multiple Vitamin (MULTIVITAMIN) tablet Take 1 tablet by mouth daily.   omeprazole (PRILOSEC) 20 MG capsule Take 20 mg by mouth 2 (two) times daily before a meal.   Potassium 99 MG TABS Take 99 mg by mouth daily.   Povidone, PF, (IVIZIA DRY EYES) 0.5 % SOLN Place 1 drop into both eyes at bedtime.   pyridOXINE (VITAMIN B6) 25 MG tablet Take 25 mg by mouth daily.   rifampin (RIFADIN) 150 MG capsule Take 420 mg by mouth daily.   SUMAtriptan  (IMITREX) 50 MG tablet Take 50 mg by mouth every 2 (two) hours as needed for migraine.   thiamine (VITAMIN B-1) 100 MG tablet Take 100 mg by mouth daily.   Tiotropium Bromide Monohydrate (SPIRIVA RESPIMAT) 1.25 MCG/ACT AERS Inhale 2 puffs into the lungs daily.   traMADol (ULTRAM) 50 MG tablet Take 50 mg by mouth 4 (four) times daily as needed.   traZODone (DESYREL) 50 MG tablet Take 50 mg by mouth at bedtime.   triamcinolone ointment (KENALOG) 0.1 % Apply to left nare daily as needed.   valsartan (DIOVAN) 320 MG tablet Take 1 tablet (320 mg total) by mouth daily.   vitamin B-12 (CYANOCOBALAMIN) 500 MCG tablet Take 500 mcg by mouth every other day.   [DISCONTINUED] Calcium Carb-Cholecalciferol (CALCIUM 600 + D PO) Take  1 tablet by mouth 2 (two) times daily.   [DISCONTINUED] losartan (COZAAR) 100 MG tablet Take 100 mg by mouth daily.     Allergies:   Caine-1 [lidocaine], Indomethacin, Other, Articaine-epinephrine, Gabapentin, Hydrocodone, Latex, and Lamisil [terbinafine]   Social History   Socioeconomic History   Marital status: Married    Spouse name: Not on file   Number of children: Not on file   Years of education: Not on file   Highest education level: Not on file  Occupational History   Not on file  Tobacco Use   Smoking status: Former    Packs/day: 3.00    Years: 10.00    Additional pack years: 0.00    Total pack years: 30.00    Types: Cigarettes    Quit date: 12/15/1982    Years since quitting: 40.1   Smokeless tobacco: Never  Vaping Use   Vaping Use: Never used  Substance and Sexual Activity   Alcohol use: Yes    Alcohol/week: 7.0 standard drinks of alcohol    Types: 7 Standard drinks or equivalent per week   Drug use: No   Sexual activity: Yes    Partners: Male    Birth control/protection: Post-menopausal  Other Topics Concern   Not on file  Social History Narrative   Not on file   Social Determinants of Health   Financial Resource Strain: Not on file  Food  Insecurity: Not on file  Transportation Needs: Not on file  Physical Activity: Not on file  Stress: Not on file  Social Connections: Not on file     Family History: The patient's family history includes Autoimmune disease in her sister; Cancer in her sister; Hypertension in her mother; Lupus in her brother.  ROS:   Please see the history of present illness.    + shortness of breath All other systems reviewed and are negative.  Labs/Other Studies Reviewed:    The following studies were reviewed today:  CT Chest w/o Contrast 01/19/23 IMPRESSION: 1. No significant change in an area of consolidation and volume loss of the right pulmonary apex. Adjacent clustered nodularity and small consolidations of overall improved, however fluctuant. Findings are consistent with ongoing atypical infection and chronic underlying sequelae, particularly atypical Mycobacterium. 2. Additional, occasional small nodules and ground-glass opacities elsewhere unchanged. 3. Incidental note of a densely rim calcified aneurysm of the right renal artery or a branch arteriole measuring 1.3 x 1.2 cm. 4. Coronary artery disease.   Aortic Atherosclerosis (ICD10-I70.0).  Recent Labs: 08/19/2022: Hemoglobin 13.7; Platelets 256.0 09/16/2022: BUN 9; Creatinine, Ser 0.69; Potassium 3.5; Sodium 133 01/17/2023: ALT 131  Recent Lipid Panel No results found for: "CHOL", "TRIG", "HDL", "CHOLHDL", "VLDL", "LDLCALC", "LDLDIRECT"   Risk Assessment/Calculations:           Physical Exam:    VS:  BP (!) 140/82   Pulse 80   Ht 5\' 3"  (1.6 m)   Wt 109 lb (49.4 kg)   LMP 12/14/1972   SpO2 99%   BMI 19.31 kg/m     Wt Readings from Last 3 Encounters:  02/07/23 109 lb (49.4 kg)  01/27/23 110 lb (49.9 kg)  01/17/23 111 lb 3.2 oz (50.4 kg)     GEN:  Well nourished, well developed in no acute distress, appears younger than stated age HEENT: Normal NECK: No JVD; No carotid bruits CARDIAC: RRR, no murmurs, rubs,  gallops RESPIRATORY:  Clear to auscultation without rales, wheezing or rhonchi  ABDOMEN: Soft, non-tender, non-distended MUSCULOSKELETAL:  No  edema; No deformity. 2+ pedal pulses, equal bilaterally SKIN: Warm and dry NEUROLOGIC:  Alert and oriented x 3 PSYCHIATRIC:  Normal affect   EKG:  EKG is ordered today.  The ekg ordered today demonstrates sinus rhythm at 80 bpm with LBBB, no acute change from previous tracing  HYPERTENSION CONTROL Vitals:   02/07/23 0955 02/07/23 1035  BP: (!) 156/72 (!) 140/82    The patient's blood pressure is elevated above target today.  In order to address the patient's elevated BP: A current anti-hypertensive medication was adjusted today.       Diagnoses:    1. LBBB (left bundle branch block)   2. Primary hypertension   3. Shortness of breath   4. Other forms of angina pectoris   5. Aortic atherosclerosis (HCC)   6. Coronary artery calcification seen on CT scan   7. Transaminitis    Assessment and Plan:     Shortness of breath: Increased shortness of breath with activity recently, particularly with walking up hills. She has always been active and noted these symptoms since March. Could be symptom of angina. No prior ischemic evaluation.  Coronary artery calcification and aortic atherosclerosis noted on CT 01/19/23. We will get echocardiogram to evaluate heart and valve valve function. Will get coronary CTA for mapping of coronary anatomy.  Will have her take metoprolol 50 mg 2 hours prior to CT. Changing losartan to valsartan in hopes to improve BP.   LBBB: Known. She denies lightheadedness, presyncope, syncope. She is having increased shortness of breath as noted above. We will get echo for evaluation of LV function.   Aortic atherosclerosis/Coronary artery calcification: Two-vessel calcification and aortic atherosclerosis noted on CT 01/2023. She is having increased shortness of breath as noted above. No chest pain. We will get coronary CTA for  mapping of coronary anatomy.  Plan to initiate treatment of hyperlipidemia in the near future as noted below.  Hypertension: BP is elevated today. Home BP has been elevated as well. Has been on increased dose of amlodipine for 6-8 weeks with some improvement.  We will change losartan to valsartan 320 mg daily.  Will have her return for BMP in 1 week.  Advised her to continue home BP monitoring and report any concerns.   Hyperlipidemia: LDL 89, HDL 105 on 05/20/2022. We discussed starting low dose statin for LDL goal < 70.  Recently elevated liver enzymes thought to be 2/2 TB medications. Has pending liver ultrasound next week. Will await results prior to starting statin. Would recommend initiation of rosuvastatin 20 mg daily once liver enzymes have improved.   Transaminitis: Elevated liver enzymes with ALT and AST 3 times normal per her report. Is not sure all test results are in our system. Is having repeat lab testing this week and u/s of liver next week. Will await results for further advisement regarding statin therapy for LDL > 70.     Disposition: 2-3 months with me  Medication Adjustments/Labs and Tests Ordered: Current medicines are reviewed at length with the patient today.  Concerns regarding medicines are outlined above.  Orders Placed This Encounter  Procedures   CT CORONARY MORPH W/CTA COR W/SCORE W/CA W/CM &/OR WO/CM   Basic Metabolic Panel (BMET)   EKG 12-Lead   ECHOCARDIOGRAM COMPLETE   Meds ordered this encounter  Medications   valsartan (DIOVAN) 320 MG tablet    Sig: Take 1 tablet (320 mg total) by mouth daily.    Dispense:  90 tablet    Refill:  3   metoprolol tartrate (LOPRESSOR) 50 MG tablet    Sig: Take one (1) tablet by mouth ( 50 mg) tablet 2 hours prior to CT scan.    Dispense:  1 tablet    Refill:  0    Patient Instructions  Medication Instructions:   DISCONTINUE Losartan  START Valsartan one (1) tablet by mouth ( 320 mg) daily.   *If you need a refill  on your cardiac medications before your next appointment, please call your pharmacy*   Lab Work:  Your physician recommends that you return for lab work on Monday, June 3rd. You can come in on the day of your appointment anytime between 7:30-4:30.   If you have labs (blood work) drawn today and your tests are completely normal, you will receive your results only by: MyChart Message (if you have MyChart) OR A paper copy in the mail If you have any lab test that is abnormal or we need to change your treatment, we will call you to review the results.   Testing/Procedures: Your physician has requested that you have an echocardiogram. Echocardiography is a painless test that uses sound waves to create images of your heart. It provides your doctor with information about the size and shape of your heart and how well your heart's chambers and valves are working. This procedure takes approximately one hour. There are no restrictions for this procedure. Please do NOT wear cologne, perfume  or lotions (deodorant is allowed). Please arrive 15 minutes prior to your appointment time. ON FRIDAY, JUNE 28.     Your cardiac CT will be scheduled at one of the below locations:   [facility] [address] [place], [place] [address] [phone]  If scheduled at [facility], please arrive at the [facility] and Children's Entrance (Entrance C2) of [facility] 30 minutes prior to test start time. You can use the FREE valet parking offered at entrance C (encouraged to control the heart rate for the test)  Proceed to the [facility] Radiology Department (first floor) to check-in and test prep.  All radiology patients and guests should use entrance C2 at [facility], accessed from [facility], even though the hospital's physical address listed is [address].    Please follow these instructions carefully (unless otherwise directed):   On the Night  Before the Test: Be sure to Drink plenty of water. Do not consume any caffeinated/decaffeinated beverages or chocolate 12 hours prior to your test. Do not take any antihistamines 12 hours prior to your test.  On the Day of the Test: Drink plenty of water until 1 hour prior to the test. Do not eat any food 1 hour prior to test. You may take your regular medications prior to the test.  Take metoprolol (Lopressor) ONE TABLET BY MOUTH (50 MG) two hours prior to test. FEMALES- please wear underwire-free bra if available, avoid dresses & tight clothing       After the Test: Drink plenty of water. After receiving IV contrast, you may experience a mild flushed feeling. This is normal. On occasion, you may experience a mild rash up to 24 hours after the test. This is not dangerous. If this occurs, you can take Benadryl 25 mg and increase your fluid intake. If you experience trouble breathing, this can be serious. If it is severe call 911 IMMEDIATELY. If it is mild, please call our office.  We will call to schedule your test 2-4  weeks out understanding that some insurance companies will need an authorization prior to the service being performed.   For non-scheduling related questions, please contact the cardiac imaging nurse navigator should you have any questions/concerns: Rockwell Alexandria, Cardiac Imaging Nurse Navigator Larey Brick, Cardiac Imaging Nurse Navigator [facility] Direct Office Dial: [phone]   For scheduling needs, including cancellations and rescheduling, please call [place], [phone].   Follow-Up: At [facility], you and your health needs are our priority.  As part of our continuing mission to provide you with exceptional heart care, we have created designated Provider Care Teams.  These Care Teams include your primary Cardiologist (physician) and Advanced Practice Providers (APPs -  Physician Assistants and Nurse Practitioners) who  all work together to provide you with the care you need, when you need it.  We recommend signing up for the patient portal called "MyChart".  Sign up information is provided on this After Visit Summary.  MyChart is used to connect with patients for Virtual Visits (Telemedicine).  Patients are able to view lab/test results, encounter notes, upcoming appointments, etc.  Non-urgent messages can be sent to your provider as well.   To learn more about what you can do with MyChart, go to [email or web address].    Your next appointment:   2 month(s)  Provider:   Eligha Bridegroom, NP     Then, Dietrich Pates, MD will plan to see you again in 2 month(s).    Other Instructions  Mediterranean Diet A Mediterranean diet refers to food and lifestyle choices that are based on the traditions of countries located on the [employer]. It focuses on eating more fruits, vegetables, whole grains, beans, nuts, seeds, and heart-healthy fats, and eating less dairy, meat, eggs, and processed foods with added sugar, salt, and fat. This way of eating has been shown to help prevent certain conditions and improve outcomes for people who have chronic diseases, like kidney disease and heart disease. What are tips for following this plan? Reading food labels Check the serving size of packaged foods. For foods such as rice and pasta, the serving size refers to the amount of cooked product, not dry. Check the total fat in packaged foods. Avoid foods that have saturated fat or trans fats. Check the ingredient list for added sugars, such as corn syrup. Shopping  Buy a variety of foods that offer a balanced diet, including: Fresh fruits and vegetables (produce). Grains, beans, nuts, and seeds. Some of these may be available in unpackaged forms or large amounts (in bulk). Fresh seafood. Poultry and eggs. Low-fat dairy products. Buy whole ingredients instead of prepackaged foods. Buy fresh fruits and vegetables in-season  from local farmers markets. Buy plain frozen fruits and vegetables. If you do not have access to quality fresh seafood, buy precooked frozen shrimp or canned fish, such as tuna, salmon, or sardines. Stock your pantry so you always have certain foods on hand, such as olive oil, canned tuna, canned tomatoes, rice, pasta, and beans. Cooking Cook foods with extra-virgin olive oil instead of using butter or other vegetable oils. Have meat as a side dish, and have vegetables or grains as your main dish. This means having meat in small portions or adding small amounts of meat to foods like pasta or stew. Use beans or vegetables instead of meat in common dishes like chili or lasagna. Experiment with different cooking methods. Try roasting, broiling, steaming, and sauting vegetables. Add frozen vegetables to soups, stews, pasta, or rice.  Add nuts or seeds for added healthy fats and plant protein at each meal. You can add these to yogurt, salads, or vegetable dishes. Marinate fish or vegetables using olive oil, lemon juice, garlic, and fresh herbs. Meal planning Plan to eat one vegetarian meal one day each week. Try to work up to two vegetarian meals, if possible. Eat seafood two or more times a week. Have healthy snacks readily available, such as: Vegetable sticks with hummus. Greek yogurt. Fruit and nut trail mix. Eat balanced meals throughout the week. This includes: Fruit: 2-3 servings a day. Vegetables: 4-5 servings a day. Low-fat dairy: 2 servings a day. Fish, poultry, or lean meat: 1 serving a day. Beans and legumes: 2 or more servings a week. Nuts and seeds: 1-2 servings a day. Whole grains: 6-8 servings a day. Extra-virgin olive oil: 3-4 servings a day. Limit red meat and sweets to only a few servings a month. Lifestyle  Cook and eat meals together with your family, when possible. Drink enough fluid to keep your urine pale yellow. Be physically active every day. This  includes: Aerobic exercise like running or swimming. Leisure activities like gardening, walking, or housework. Get 7-8 hours of sleep each night. If recommended by your health care provider, drink red wine in moderation. This means 1 glass a day for nonpregnant women and 2 glasses a day for men. A glass of wine equals 5 oz (150 mL). What foods should I eat? Fruits Apples. Apricots. Avocado. Berries. Bananas. Cherries. Dates. Figs. Grapes. Lemons. Melon. Oranges. Peaches. Plums. Pomegranate. Vegetables Artichokes. Beets. Broccoli. Cabbage. Carrots. Eggplant. Green beans. Chard. Kale. Spinach. Onions. Leeks. Peas. Squash. Tomatoes. Peppers. Radishes. Grains Whole-grain pasta. Brown rice. Bulgur wheat. Polenta. Couscous. Whole-wheat bread. Orpah Cobb. Meats and other proteins Beans. Almonds. Sunflower seeds. Pine nuts. Peanuts. Cod. Salmon. Scallops. Shrimp. Tuna. Tilapia. Clams. Oysters. Eggs. Poultry without skin. Dairy Low-fat milk. Cheese. Greek yogurt. Fats and oils Extra-virgin olive oil. Avocado oil. Grapeseed oil. Beverages Water. Red wine. Herbal tea. Sweets and desserts Greek yogurt with honey. Baked apples. Poached pears. Trail mix. Seasonings and condiments Basil. Cilantro. Coriander. Cumin. Mint. Parsley. Sage. Rosemary. Tarragon. Garlic. Oregano. Thyme. Pepper. Balsamic vinegar. Tahini. Hummus. Tomato sauce. Olives. Mushrooms. The items listed above may not be a complete list of foods and beverages you can eat. Contact a dietitian for more information. What foods should I limit? This is a list of foods that should be eaten rarely or only on special occasions. Fruits Fruit canned in syrup. Vegetables Deep-fried potatoes (french fries). Grains Prepackaged pasta or rice dishes. Prepackaged cereal with added sugar. Prepackaged snacks with added sugar. Meats and other proteins Beef. Pork. Lamb. Poultry with skin. Hot dogs. Tomasa Blase. Dairy Ice cream. Sour cream. Whole  milk. Fats and oils Butter. Canola oil. Vegetable oil. Beef fat (tallow). Lard. Beverages Juice. Sugar-sweetened soft drinks. Beer. Liquor and spirits. Sweets and desserts Cookies. Cakes. Pies. Candy. Seasonings and condiments Mayonnaise. Pre-made sauces and marinades. The items listed above may not be a complete list of foods and beverages you should limit. Contact a dietitian for more information. Summary The Mediterranean diet includes both food and lifestyle choices. Eat a variety of fresh fruits and vegetables, beans, nuts, seeds, and whole grains. Limit the amount of red meat and sweets that you eat. If recommended by your health care provider, drink red wine in moderation. This means 1 glass a day for nonpregnant women and 2 glasses a day for men. A glass of wine equals 5 oz (  150 mL). This information is not intended to replace advice given to you by your health care provider. Make sure you discuss any questions you have with your health care provider. Document Revised: 10/04/2019 Document Reviewed: 08/01/2019 Elsevier Patient Education  2023 Elsevier Inc. Adopting a Healthy Lifestyle.   Weight: Know what a healthy weight is for you (roughly BMI <25) and aim to maintain this. You can calculate your body mass index on your smart phone  Diet: Aim for 7+ servings of fruits and vegetables daily Limit animal fats in diet for cholesterol and heart health - choose grass fed whenever available Avoid highly processed foods (fast food burgers, tacos, fried chicken, pizza, hot dogs, french fries)  Saturated fat comes in the form of butter, lard, coconut oil, margarine, partially hydrogenated oils, and fat in meat. These increase your risk of cardiovascular disease.  Use healthy plant oils, such as olive, canola, soy, corn, sunflower and peanut.  Whole foods such as fruits, vegetables and whole grains have fiber  Men need > 38 grams of fiber per day Women need > 25 grams of fiber per day   Load up on vegetables and fruits - one-half of your plate: Aim for color and variety, and remember that potatoes dont count. Go for whole grains - one-quarter of your plate: Whole wheat, barley, wheat berries, quinoa, oats, brown rice, and foods made with them. If you want pasta, go with whole wheat pasta. Protein power - one-quarter of your plate: Fish, chicken, beans, and nuts are all healthy, versatile protein sources. Limit red meat. You need carbohydrates for energy! The type of carbohydrate is more important than the amount. Choose carbohydrates such as vegetables, fruits, whole grains, beans, and nuts in the place of white rice, white pasta, potatoes (baked or fried), macaroni and cheese, cakes, cookies, and donuts.  If youre thirsty, drink water. Coffee and tea are good in moderation, but skip sugary drinks and limit milk and dairy products to one or two daily servings. Keep sugar intake at 6 teaspoons or 24 grams or LESS       Exercise: Aim for 150 min of moderate intensity exercise weekly for heart health, and weights twice weekly for bone health Stay active - any steps are better than no steps! Aim for 7-9 hours of sleep daily          Signed, Levi Aland, NP  02/07/2023 10:54 AM    [facility]

## 2023-02-07 ENCOUNTER — Encounter: Payer: Self-pay | Admitting: Nurse Practitioner

## 2023-02-07 ENCOUNTER — Ambulatory Visit: Payer: Medicare Other | Attending: Nurse Practitioner | Admitting: Nurse Practitioner

## 2023-02-07 VITALS — BP 140/82 | HR 80 | Ht 63.0 in | Wt 109.0 lb

## 2023-02-07 DIAGNOSIS — I25118 Atherosclerotic heart disease of native coronary artery with other forms of angina pectoris: Secondary | ICD-10-CM

## 2023-02-07 DIAGNOSIS — I2089 Other forms of angina pectoris: Secondary | ICD-10-CM | POA: Diagnosis not present

## 2023-02-07 DIAGNOSIS — R0602 Shortness of breath: Secondary | ICD-10-CM | POA: Diagnosis not present

## 2023-02-07 DIAGNOSIS — I10 Essential (primary) hypertension: Secondary | ICD-10-CM | POA: Insufficient documentation

## 2023-02-07 DIAGNOSIS — R7401 Elevation of levels of liver transaminase levels: Secondary | ICD-10-CM | POA: Insufficient documentation

## 2023-02-07 DIAGNOSIS — I2510 Atherosclerotic heart disease of native coronary artery without angina pectoris: Secondary | ICD-10-CM | POA: Insufficient documentation

## 2023-02-07 DIAGNOSIS — I447 Left bundle-branch block, unspecified: Secondary | ICD-10-CM | POA: Diagnosis not present

## 2023-02-07 DIAGNOSIS — I700 Atherosclerosis of aorta: Secondary | ICD-10-CM | POA: Diagnosis not present

## 2023-02-07 MED ORDER — METOPROLOL TARTRATE 50 MG PO TABS: ORAL_TABLET | ORAL | 0 refills | Status: DC

## 2023-02-07 MED ORDER — VALSARTAN 320 MG PO TABS: 320.0000 mg | ORAL_TABLET | Freq: Every day | ORAL | 3 refills | Status: DC

## 2023-02-07 NOTE — Patient Instructions (Signed)
Medication Instructions:   DISCONTINUE Losartan  START Valsartan one (1) tablet by mouth ( 320 mg) daily.   *If you need a refill on your cardiac medications before your next appointment, please call your pharmacy*   Lab Work:  Your physician recommends that you return for lab work on Monday, June 3rd. You can come in on the day of your appointment anytime between 7:30-4:30.   If you have labs (blood work) drawn today and your tests are completely normal, you will receive your results only by: MyChart Message (if you have MyChart) OR A paper copy in the mail If you have any lab test that is abnormal or we need to change your treatment, we will call you to review the results.   Testing/Procedures: Your physician has requested that you have an echocardiogram. Echocardiography is a painless test that uses sound waves to create images of your heart. It provides your doctor with information about the size and shape of your heart and how well your heart's chambers and valves are working. This procedure takes approximately one hour. There are no restrictions for this procedure. Please do NOT wear cologne, perfume  or lotions (deodorant is allowed). Please arrive 15 minutes prior to your appointment time. ON FRIDAY, JUNE 28.     Your cardiac CT will be scheduled at one of the below locations:   [facility] [address] [place], [place] [address] [phone]  If scheduled at [facility], please arrive at the [facility] Children[facility] Entrance (Entrance C2) of [facility] 30 minutes prior to test start time. You can use the FREE valet parking offered at entrance C (encouraged to control the heart rate for the test)  Proceed to the [facility] Radiology Department (first floor) to check-in and test prep.  All radiology patients and guests should use entrance C2 at [facility], accessed from [facility], even though the hospital's physical  address listed is [address].    Please follow these instructions carefully (unless otherwise directed):   On the Night Before the Test: Be sure to Drink plenty of water. Do not consume any caffeinated/decaffeinated beverages or chocolate 12 hours prior to your test. Do not take any antihistamines 12 hours prior to your test.  On the Day of the Test: Drink plenty of water until 1 hour prior to the test. Do not eat any food 1 hour prior to test. You may take your regular medications prior to the test.  Take metoprolol (Lopressor) ONE TABLET BY MOUTH (50 MG) two hours prior to test. FEMALES- please wear underwire-free bra if available, avoid dresses & tight clothing       After the Test: Drink plenty of water. After receiving IV contrast, you may experience a mild flushed feeling. This is normal. On occasion, you may experience a mild rash up to 24 hours after the test. This is not dangerous. If this occurs, you can take Benadryl 25 mg and increase your fluid intake. If you experience trouble breathing, this can be serious. If it is severe call 911 IMMEDIATELY. If it is mild, please call our office.  We will call to schedule your test 2-4 weeks out understanding that some insurance companies will need an authorization prior to the service being performed.   For non-scheduling related questions, please contact the cardiac imaging nurse navigator should you have any questions/concerns: Rockwell Alexandria, Cardiac Imaging Nurse Navigator Larey Brick, Cardiac Imaging Nurse Navigator [facility]  Heart and Vascular Services Direct Office Dial: [phone]   For scheduling needs, including cancellations and rescheduling, please call [place], [phone].   Follow-Up: At [facility], you and your health needs are our priority.  As part of our continuing mission to provide you with exceptional heart care, we have created designated Provider Care Teams.  These Care  Teams include your primary Cardiologist (physician) and Advanced Practice Providers (APPs -  Physician Assistants and Nurse Practitioners) who all work together to provide you with the care you need, when you need it.  We recommend signing up for the patient portal called "MyChart".  Sign up information is provided on this After Visit Summary.  MyChart is used to connect with patients for Virtual Visits (Telemedicine).  Patients are able to view lab/test results, encounter notes, upcoming appointments, etc.  Non-urgent messages can be sent to your provider as well.   To learn more about what you can do with MyChart, go to [email or web address].    Your next appointment:   2 month(s)  Provider:   Eligha Bridegroom, NP     Then, Dietrich Pates, MD will plan to see you again in 2 month(s).    Other Instructions  Mediterranean Diet A Mediterranean diet refers to food and lifestyle choices that are based on the traditions of countries located on the [employer]. It focuses on eating more fruits, vegetables, whole grains, beans, nuts, seeds, and heart-healthy fats, and eating less dairy, meat, eggs, and processed foods with added sugar, salt, and fat. This way of eating has been shown to help prevent certain conditions and improve outcomes for people who have chronic diseases, like kidney disease and heart disease. What are tips for following this plan? Reading food labels Check the serving size of packaged foods. For foods such as rice and pasta, the serving size refers to the amount of cooked product, not dry. Check the total fat in packaged foods. Avoid foods that have saturated fat or trans fats. Check the ingredient list for added sugars, such as corn syrup. Shopping  Buy a variety of foods that offer a balanced diet, including: Fresh fruits and vegetables (produce). Grains, beans, nuts, and seeds. Some of these may be available in unpackaged forms or large amounts (in bulk). Fresh  seafood. Poultry and eggs. Low-fat dairy products. Buy whole ingredients instead of prepackaged foods. Buy fresh fruits and vegetables in-season from local farmers markets. Buy plain frozen fruits and vegetables. If you do not have access to quality fresh seafood, buy precooked frozen shrimp or canned fish, such as tuna, salmon, or sardines. Stock your pantry so you always have certain foods on hand, such as olive oil, canned tuna, canned tomatoes, rice, pasta, and beans. Cooking Cook foods with extra-virgin olive oil instead of using butter or other vegetable oils. Have meat as a side dish, and have vegetables or grains as your main dish. This means having meat in small portions or adding small amounts of meat to foods like pasta or stew. Use beans or vegetables instead of meat in common dishes like chili or lasagna. Experiment with different cooking methods. Try roasting, broiling, steaming, and sauting vegetables. Add frozen vegetables to soups, stews, pasta, or rice. Add nuts or seeds for added healthy fats and plant protein at each meal. You can add these to yogurt, salads, or vegetable dishes. Marinate fish or vegetables using olive oil, lemon juice, garlic, and fresh herbs. Meal planning Plan to eat one vegetarian meal one day each week.  Try to work up to two vegetarian meals, if possible. Eat seafood two or more times a week. Have healthy snacks readily available, such as: Vegetable sticks with hummus. Greek yogurt. Fruit and nut trail mix. Eat balanced meals throughout the week. This includes: Fruit: 2-3 servings a day. Vegetables: 4-5 servings a day. Low-fat dairy: 2 servings a day. Fish, poultry, or lean meat: 1 serving a day. Beans and legumes: 2 or more servings a week. Nuts and seeds: 1-2 servings a day. Whole grains: 6-8 servings a day. Extra-virgin olive oil: 3-4 servings a day. Limit red meat and sweets to only a few servings a month. Lifestyle  Cook and eat meals  together with your family, when possible. Drink enough fluid to keep your urine pale yellow. Be physically active every day. This includes: Aerobic exercise like running or swimming. Leisure activities like gardening, walking, or housework. Get 7-8 hours of sleep each night. If recommended by your health care provider, drink red wine in moderation. This means 1 glass a day for nonpregnant women and 2 glasses a day for men. A glass of wine equals 5 oz (150 mL). What foods should I eat? Fruits Apples. Apricots. Avocado. Berries. Bananas. Cherries. Dates. Figs. Grapes. Lemons. Melon. Oranges. Peaches. Plums. Pomegranate. Vegetables Artichokes. Beets. Broccoli. Cabbage. Carrots. Eggplant. Green beans. Chard. Kale. Spinach. Onions. Leeks. Peas. Squash. Tomatoes. Peppers. Radishes. Grains Whole-grain pasta. Brown rice. Bulgur wheat. Polenta. Couscous. Whole-wheat bread. Orpah Cobb. Meats and other proteins Beans. Almonds. Sunflower seeds. Pine nuts. Peanuts. Cod. Salmon. Scallops. Shrimp. Tuna. Tilapia. Clams. Oysters. Eggs. Poultry without skin. Dairy Low-fat milk. Cheese. Greek yogurt. Fats and oils Extra-virgin olive oil. Avocado oil. Grapeseed oil. Beverages Water. Red wine. Herbal tea. Sweets and desserts Greek yogurt with honey. Baked apples. Poached pears. Trail mix. Seasonings and condiments Basil. Cilantro. Coriander. Cumin. Mint. Parsley. Sage. Rosemary. Tarragon. Garlic. Oregano. Thyme. Pepper. Balsamic vinegar. Tahini. Hummus. Tomato sauce. Olives. Mushrooms. The items listed above may not be a complete list of foods and beverages you can eat. Contact a dietitian for more information. What foods should I limit? This is a list of foods that should be eaten rarely or only on special occasions. Fruits Fruit canned in syrup. Vegetables Deep-fried potatoes (french fries). Grains Prepackaged pasta or rice dishes. Prepackaged cereal with added sugar. Prepackaged snacks with  added sugar. Meats and other proteins Beef. Pork. Lamb. Poultry with skin. Hot dogs. Tomasa Blase. Dairy Ice cream. Sour cream. Whole milk. Fats and oils Butter. Canola oil. Vegetable oil. Beef fat (tallow). Lard. Beverages Juice. Sugar-sweetened soft drinks. Beer. Liquor and spirits. Sweets and desserts Cookies. Cakes. Pies. Candy. Seasonings and condiments Mayonnaise. Pre-made sauces and marinades. The items listed above may not be a complete list of foods and beverages you should limit. Contact a dietitian for more information. Summary The Mediterranean diet includes both food and lifestyle choices. Eat a variety of fresh fruits and vegetables, beans, nuts, seeds, and whole grains. Limit the amount of red meat and sweets that you eat. If recommended by your health care provider, drink red wine in moderation. This means 1 glass a day for nonpregnant women and 2 glasses a day for men. A glass of wine equals 5 oz (150 mL). This information is not intended to replace advice given to you by your health care provider. Make sure you discuss any questions you have with your health care provider. Document Revised: 10/04/2019 Document Reviewed: 08/01/2019 Elsevier Patient Education  2023 Elsevier Inc. Adopting a Healthy Lifestyle.  Weight: Know what a healthy weight is for you (roughly BMI <25) and aim to maintain this. You can calculate your body mass index on your smart phone  Diet: Aim for 7+ servings of fruits and vegetables daily Limit animal fats in diet for cholesterol and heart health - choose grass fed whenever available Avoid highly processed foods (fast food burgers, tacos, fried chicken, pizza, hot dogs, french fries)  Saturated fat comes in the form of butter, lard, coconut oil, margarine, partially hydrogenated oils, and fat in meat. These increase your risk of cardiovascular disease.  Use healthy plant oils, such as olive, canola, soy, corn, sunflower and peanut.  Whole foods such  as fruits, vegetables and whole grains have fiber  Men need > 38 grams of fiber per day Women need > 25 grams of fiber per day  Load up on vegetables and fruits - one-half of your plate: Aim for color and variety, and remember that potatoes dont count. Go for whole grains - one-quarter of your plate: Whole wheat, barley, wheat berries, quinoa, oats, brown rice, and foods made with them. If you want pasta, go with whole wheat pasta. Protein power - one-quarter of your plate: Fish, chicken, beans, and nuts are all healthy, versatile protein sources. Limit red meat. You need carbohydrates for energy! The type of carbohydrate is more important than the amount. Choose carbohydrates such as vegetables, fruits, whole grains, beans, and nuts in the place of white rice, white pasta, potatoes (baked or fried), macaroni and cheese, cakes, cookies, and donuts.  If youre thirsty, drink water. Coffee and tea are good in moderation, but skip sugary drinks and limit milk and dairy products to one or two daily servings. Keep sugar intake at 6 teaspoons or 24 grams or LESS       Exercise: Aim for 150 min of moderate intensity exercise weekly for heart health, and weights twice weekly for bone health Stay active - any steps are better than no steps! Aim for 7-9 hours of sleep daily

## 2023-02-13 ENCOUNTER — Ambulatory Visit: Payer: Medicare Other | Attending: Internal Medicine

## 2023-02-13 DIAGNOSIS — R0602 Shortness of breath: Secondary | ICD-10-CM

## 2023-02-13 DIAGNOSIS — I2089 Other forms of angina pectoris: Secondary | ICD-10-CM

## 2023-02-13 DIAGNOSIS — I10 Essential (primary) hypertension: Secondary | ICD-10-CM

## 2023-02-13 DIAGNOSIS — I447 Left bundle-branch block, unspecified: Secondary | ICD-10-CM | POA: Diagnosis not present

## 2023-02-13 LAB — BASIC METABOLIC PANEL
BUN/Creatinine Ratio: 11 — ABNORMAL LOW (ref 12–28)
BUN: 8 mg/dL (ref 8–27)
CO2: 24 mmol/L (ref 20–29)
Calcium: 9 mg/dL (ref 8.7–10.3)
Chloride: 100 mmol/L (ref 96–106)
Creatinine, Ser: 0.72 mg/dL (ref 0.57–1.00)
Glucose: 67 mg/dL — ABNORMAL LOW (ref 70–99)
Potassium: 3.7 mmol/L (ref 3.5–5.2)
Sodium: 136 mmol/L (ref 134–144)
eGFR: 86 mL/min/{1.73_m2} (ref >59)

## 2023-02-17 ENCOUNTER — Ambulatory Visit
Admission: RE | Admit: 2023-02-17 | Discharge: 2023-02-17 | Disposition: A | Discharge status: Home / Self Care | Payer: Medicare Other | Source: Ambulatory Visit | Attending: Internal Medicine | Admitting: Internal Medicine

## 2023-02-17 DIAGNOSIS — R945 Abnormal results of liver function studies: Secondary | ICD-10-CM | POA: Diagnosis not present

## 2023-02-17 DIAGNOSIS — Z201 Contact with and (suspected) exposure to tuberculosis: Secondary | ICD-10-CM

## 2023-02-28 ENCOUNTER — Encounter (HOSPITAL_COMMUNITY): Payer: Self-pay

## 2023-03-01 ENCOUNTER — Telehealth (HOSPITAL_COMMUNITY): Payer: Self-pay | Admitting: *Deleted

## 2023-03-01 NOTE — Telephone Encounter (Signed)
Attempted to call patient regarding upcoming cardiac CT appointment. Left message on voicemail with name and callback number Kamyia Thomason RN Navigator Cardiac Imaging [place] [facility] [phone] Office   

## 2023-03-01 NOTE — Telephone Encounter (Signed)
Patient returning call about her upcoming cardiac imaging study; pt verbalizes understanding of appt date/time, parking situation and where to check in, pre-test NPO status and medications ordered, and verified current allergies; name and call back number provided for further questions should they arise  Larey Brick RN Navigator Cardiac Imaging [facility] [phone] office [phone] cell  Patient to take 50mg  metoprolol tartrate two hours prior to her cardiac CT scan.  She is aware to arrive at 10:30am.

## 2023-03-02 ENCOUNTER — Ambulatory Visit (HOSPITAL_COMMUNITY)
Admission: RE | Admit: 2023-03-02 | Discharge: 2023-03-02 | Disposition: A | Discharge status: Home / Self Care | Payer: Medicare Other | Source: Ambulatory Visit | Attending: Nurse Practitioner | Admitting: Nurse Practitioner

## 2023-03-02 DIAGNOSIS — I2089 Other forms of angina pectoris: Secondary | ICD-10-CM | POA: Diagnosis not present

## 2023-03-02 DIAGNOSIS — M25552 Pain in left hip: Secondary | ICD-10-CM | POA: Diagnosis not present

## 2023-03-02 DIAGNOSIS — R0602 Shortness of breath: Secondary | ICD-10-CM | POA: Insufficient documentation

## 2023-03-02 DIAGNOSIS — F4321 Adjustment disorder with depressed mood: Secondary | ICD-10-CM | POA: Diagnosis not present

## 2023-03-02 DIAGNOSIS — M47816 Spondylosis without myelopathy or radiculopathy, lumbar region: Secondary | ICD-10-CM | POA: Diagnosis not present

## 2023-03-02 DIAGNOSIS — G894 Chronic pain syndrome: Secondary | ICD-10-CM | POA: Diagnosis not present

## 2023-03-02 DIAGNOSIS — I10 Essential (primary) hypertension: Secondary | ICD-10-CM | POA: Insufficient documentation

## 2023-03-02 DIAGNOSIS — I447 Left bundle-branch block, unspecified: Secondary | ICD-10-CM | POA: Diagnosis not present

## 2023-03-02 MED ORDER — IOHEXOL 350 MG/ML SOLN
100.0000 mL | Freq: Once | INTRAVENOUS | Status: AC | PRN
  Administered 2023-03-02: 100 mL via INTRAVENOUS

## 2023-03-02 MED ORDER — NITROGLYCERIN 0.4 MG SL SUBL
SUBLINGUAL_TABLET | SUBLINGUAL | Status: AC
  Filled 2023-03-02: qty 2

## 2023-03-02 MED ORDER — NITROGLYCERIN 0.4 MG SL SUBL
0.8000 mg | SUBLINGUAL_TABLET | Freq: Once | SUBLINGUAL | Status: AC
  Administered 2023-03-02: 0.8 mg via SUBLINGUAL

## 2023-03-03 ENCOUNTER — Ambulatory Visit: Payer: Self-pay | Admitting: Internal Medicine

## 2023-03-03 NOTE — Progress Notes (Cosign Needed Addendum)
Interstitial Lung Disease Multidisciplinary Conference   MARESSA APOLLO    MRN [id number]    DOB [birth year]  Primary Care Physician:Raju, Dorris Singh, MD  Referring Physician: Dr. Kalman Shan  Time of Conference: 7.00am- 8.00am Date of conference: 02/21/2023 Location of Conference: -  Virtual  Participating Pulmonary: Dr. Kalman Shan, Dr Chilton Greathouse, Dr. Durel Salts  Pathology: - Radiology: Dr Allegra Lai  Others: n/a  Brief History: Former smoker, thin female. White - classic for MAI. Had cough in 2023 . Had sputum positive for MTB Oct 2023 but Bronch BAL RUL Jan 2024 negative and Quant gold negative . As a child was exposed to grandad with MTb. Cough better with 4 months of Anti TB Rx but having Drug induced liver injury. CT is not better  Discussion  Is this MTb? How can sputum be positive but Bronch negative?  How come cough better but not CT?   PFT    Latest Ref Rng & Units 12/28/2021   10:42 AM  PFT Results  FVC-Pre L 3.18   FVC-Predicted Pre % 121   FVC-Post L 3.07   FVC-Predicted Post % 117   Pre FEV1/FVC % % 72   Post FEV1/FCV % % 80   FEV1-Pre L 2.29   FEV1-Predicted Pre % 117   FEV1-Post L 2.44   DLCO uncorrected ml/min/mmHg 15.17   DLCO UNC% % 82   DLCO corrected ml/min/mmHg 15.17   DLCO COR %Predicted % 82   DLVA Predicted % 72   TLC L 4.40   TLC % Predicted % 89   RV % Predicted % 64       MDD discussion of CT scan    - Date or time period of scan:  CT Chest WO: 01/13/2023 CT Chest WO: 09/21/2022 HRCT: 12/17/2021  - Discussion synopsis:   Jan 13, 2023 and Sep 21, 2022 -> Wazing and waingin CT but there are areas of improvement but also new areas of nodulairty.Overall slightly better.   - What is the final conclusion per 2018 ATS/Fleischner Criteria -  C/w M Tb or NTM  - Concordance with official report: Concordant  Pathology discussion of biopsy: n/a    MDD Impression/Recs: Support currnet Rx .No need to  biopsy   Time Spent in preparation and discussion:  > 30 min    SIGNATURE   Dr. Kalman Shan, M.D., F.C.C.P,  Pulmonary and Critical Care Medicine Staff Physician, [facility] Center Director - Interstitial Lung Disease  Program  Pulmonary Fibrosis [facility] at [facility] [place], [place], [address]  Pager: [phone], If no answer or between  15:00h - 7:00h: call [phone] Telephone: [phone]  12:52 PM 03/03/2023 ...................................................................................................................Marland Kitchen References: Diagnosis of Hypersensitivity Pneumonitis in Adults. An Official ATS/JRS/ALAT Clinical Practice Guideline. Ragu G et al, Am J Respir Crit Care Med. 2020 Aug 1;202(3):e36-e69.       Diagnosis of Idiopathic Pulmonary Fibrosis. An Official ATS/ERS/JRS/ALAT Clinical Practice Guideline. Raghu G et al, Am J Respir Crit Care Med. 2018 Sep 1;198(5):e44-e68.   IPF Suspected   Histopath ology Pattern      UIP  Probable UIP  Indeterminate for  UIP  Alternative  diagnosis    UIP  IPF  IPF  IPF  Non-IPF dx   HRCT   Probabe UIP  IPF  IPF  IPF (Likely)**  Non-IPF dx  Pattern  Indeterminate for UIP  IPF  IPF (Likely)**  Indeterminate  for  IPF**  Non-IPF dx    Alternative diagnosis  IPF (Likely)**/ non-IPF dx  Non-IPF dx  Non-IPF dx  Non-IPF dx     Idiopathic pulmonary fibrosis diagnosis based upon HRCT and Biopsy paterns.  ** IPF is the likely diagnosis when any of following features are present:  Moderate-to-severe traction bronchiectasis/bronchiolectasis (defined as mild traction bronchiectasis/bronchiolectasis in four or more lobes including the lingual as a lobe, or moderate to severe traction bronchiectasis in two or more lobes) in a man over age 38 years or in a woman over age 68 years Extensive (>30%) reticulation on HRCT and an age >70 years  Increased neutrophils  and/or absence of lymphocytosis in BAL fluid  Multidisciplinary discussion reaches a confident diagnosis of IPF.   **Indeterminate for IPF  Without an adequate biopsy is unlikely to be IPF  With an adequate biopsy may be reclassified to a more specific diagnosis after multidisciplinary discussion and/or additional consultation.   dx = diagnosis; HRCT = high-resolution computed tomography; IPF = idiopathic pulmonary fibrosis; UIP = usual interstitial pneumonia.

## 2023-03-06 DIAGNOSIS — G43719 Chronic migraine without aura, intractable, without status migrainosus: Secondary | ICD-10-CM | POA: Diagnosis not present

## 2023-03-09 DIAGNOSIS — J341 Cyst and mucocele of nose and nasal sinus: Secondary | ICD-10-CM | POA: Diagnosis not present

## 2023-03-09 DIAGNOSIS — J3489 Other specified disorders of nose and nasal sinuses: Secondary | ICD-10-CM | POA: Diagnosis not present

## 2023-03-10 ENCOUNTER — Other Ambulatory Visit: Payer: Self-pay | Admitting: Internal Medicine

## 2023-03-10 ENCOUNTER — Ambulatory Visit (HOSPITAL_COMMUNITY): Payer: Medicare Other | Attending: Internal Medicine

## 2023-03-10 DIAGNOSIS — I2089 Other forms of angina pectoris: Secondary | ICD-10-CM | POA: Diagnosis not present

## 2023-03-10 DIAGNOSIS — Z1231 Encounter for screening mammogram for malignant neoplasm of breast: Secondary | ICD-10-CM

## 2023-03-10 DIAGNOSIS — R0602 Shortness of breath: Secondary | ICD-10-CM | POA: Diagnosis not present

## 2023-03-10 DIAGNOSIS — I447 Left bundle-branch block, unspecified: Secondary | ICD-10-CM

## 2023-03-10 DIAGNOSIS — I10 Essential (primary) hypertension: Secondary | ICD-10-CM | POA: Diagnosis not present

## 2023-03-10 LAB — ECHOCARDIOGRAM COMPLETE
Area-P 1/2: 2.74 cm2
P 1/2 time: 501 msec
S' Lateral: 2.2 cm

## 2023-04-02 NOTE — Progress Notes (Signed)
Cardiology Office Note:    Date:  04/10/2023   ID:  MERRYN THAKER, DOB Dec 24, 1945, MRN [id number]  PCP:  Thana Ates, MD   [facility] Providers Cardiologist:  Dietrich Pates, MD     Referring MD: Thana Ates, MD   Chief Complaint: shortness of breath  History of Present Illness:    Elaine Navarro is a very pleasant 77 y.o. female with a hx of  hypertension, LBBB, renal artery aneurysm, aortic atherosclerosis and coronary artery disease on CT, and pulmonary TB.   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 While in [place] as a child, was exposed to both her grandfather and her aunt with tuberculosis. Remote smoking history, quit in 1984.  Referred to cardiology for evaluation prior to right knee surgery and seen by Dr. Tenny Craw on 01/18/2022. She had newly identified LBBB on EKG. No prior cardiac history.  Up until a few weeks prior she was very active, dancing competitively. She was feeling well with no concerning cardiac symptoms and was felt low risk for upcoming surgery.  She was advised to follow-up in 6 to 9 months.  She was diagnosed with Mycobacterium TB and treatment started 09/21/22 (RIZE) based on sputum culture from bronchoscopy 09/2022.  QuantiFERON gold was negative. Has been on consolidation with RI. Negative sputum cultures April.   Seen by me on 02/07/23 for elevation in BP and increased shortness of breath with activity. Continuing to dance competively, just won the [employer] in March. Noting more shortness of breath with dancing and while playing golf the weekend prior. She was walking the golf course and noted particular difficulty with inclines. BP has improved with increase of amlodipine to 10 mg 6-8 weeks prior. Home BP consistently 140s over 80s mmHg. She denied orthopnea, PND, edema, chest pain, presyncope, syncope, and palpitations.  Concerned about aortic atherosclerosis and coronary calcifications noted on recent CT  ordered by pulmonology.  Continues to [facility]. Had recent elevation in liver enzymes and medications were discontinued, now slowly being resumed under direction of health dept. Dr. Marchelle Gearing has scheduled a liver ultrasound for next week. Eats a healthy diet, she is a vegetarian and avoids dairy. Coronary CTA was ordered to r/o ischemia and revealed coronary calcium score of 49.3 (42nd percentile), total plaque volume 72, minimal CAD < 25% stenosis. Echo completed 03/10/23 revealed normal LVEF 55-60%, G1DD, mild AI, mild MR, and mild to moderate TR.  Today, she is here alone for follow-up. Reports she is feeling well. Danced at a fast pace for 1 1/2 hours yesterday with no siginificant DOE. She has questions about the percentages related to coronary CTA. We reviewed CT and echo results in detail. She is concerned that BP is elevated and was recently elevated at GYN office visit. Home SBP typically 135-138 mmHg. She denies chest pain, shortness of breath, lower extremity edema, fatigue, palpitations, melena, hematuria, hemoptysis, diaphoresis, weakness, presyncope, syncope, orthopnea, and PND. Plan is to take TB medication for 2 more weeks. She will follow-up with Dr. Marchelle Gearing, pulmonology in a few weeks. Liver enzymes have improved per her report.    Past Medical History:  Diagnosis Date   Aneurysm of renal artery (HCC)    followed at [facility], stable with CT/angiogram 10/2021   Fracture of right wrist 2011   Fell in bathroom.   GERD (gastroesophageal reflux disease)    Hypertension    Left bundle branch block  Left thyroid nodule 2003   S/P biopsy - benign   Lymphocytic colitis 03/2013   Migraines    Osteoporosis    PONV (postoperative nausea and vomiting)    Sciatica    Visceral injury    detached due to recent fall    Past Surgical History:  Procedure Laterality Date   ABDOMINOPLASTY  2010   APPENDECTOMY  1962   BREAST EXCISIONAL BIOPSY  Bilateral 2001   x 3   BRONCHIAL WASHINGS  09/16/2022   Procedure: BRONCHIAL WASHINGS;  Surgeon: Kalman Shan, MD;  Location: [facility] ENDOSCOPY;  Service: Endoscopy;;   CATARACT EXTRACTION, BILATERAL Bilateral 01/2020   Dental implants  1993   HEMORRHOID SURGERY  2000   JOINT REPLACEMENT     KNEE ARTHROSCOPY W/ DEBRIDEMENT Right 07/2020   Dr. Luiz Blare   TOTAL KNEE ARTHROPLASTY Right 01/21/2022   Procedure: RIGHT TOTAL KNEE ARTHROPLASTY;  Surgeon: Kathryne Hitch, MD;  Location: [facility] ORS;  Service: Orthopedics;  Laterality: Right;  RFNA   VAGINAL HYSTERECTOMY  1974   Dysfunctional uterine bleeding.   VIDEO BRONCHOSCOPY N/A 09/16/2022   Procedure: VIDEO BRONCHOSCOPY WITHOUT FLUORO;  Surgeon: Kalman Shan, MD;  Location: [facility] ENDOSCOPY;  Service: Endoscopy;  Laterality: N/A;    Current Medications: Current Meds  Medication Sig   albuterol (VENTOLIN HFA) 108 (90 Base) MCG/ACT inhaler Inhale 2 puffs into the lungs every 6 (six) hours as needed for wheezing or shortness of breath.   amLODipine (NORVASC) 10 MG tablet Take 10 mg by mouth daily.   ascorbic acid (VITAMIN C) 500 MG tablet Take 500 mg by mouth daily.   botulinum toxin Type A (BOTOX) 100 units SOLR injection Inject into the skin.   budesonide (ENTOCORT EC) 3 MG 24 hr capsule Take 3 mg by mouth daily as needed (colitis flare).   buPROPion (WELLBUTRIN XL) 150 MG 24 hr tablet Take 1 tablet (150 mg total) by mouth daily.   chlorhexidine (PERIDEX) 0.12 % solution SMARTSIG:By Mouth   Cholecalciferol (VITAMIN D) 50 MCG (2000 UT) tablet Take 2,000 Units by mouth daily.   clobetasol cream (TEMOVATE) 0.05 % APPLY TOPICALLY TO THE AFFECTED AREA TWICE DAILY. STOP WHEN SMOOTH. AVOID FACE AND BODY FOLDS   ferrous sulfate 325 (65 FE) MG EC tablet Take 325 mg by mouth every Monday.   finasteride (PROSCAR) 5 MG tablet Take 5 mg by mouth daily.   isoniazid (NYDRAZID) 300 MG tablet Take 300 mg by mouth daily.   L-CYSTEINE PO Take 500 mg by mouth  daily.   Melatonin 5 MG TABS Take 5 mg by mouth at bedtime as needed (sleep).   Menaquinone-7 (VITAMIN K2) 100 MCG CAPS Take 100 mcg by mouth daily.   Multiple Vitamin (MULTIVITAMIN) tablet Take 1 tablet by mouth daily.   omeprazole (PRILOSEC) 20 MG capsule Take 20 mg by mouth 2 (two) times daily before a meal.   Potassium 99 MG TABS Take 99 mg by mouth daily.   Povidone, PF, (IVIZIA DRY EYES) 0.5 % SOLN Place 1 drop into both eyes at bedtime.   rifampin (RIFADIN) 150 MG capsule Take 420 mg by mouth daily.   SUMAtriptan (IMITREX) 50 MG tablet Take 50 mg by mouth every 2 (two) hours as needed for migraine.   terconazole (TERAZOL 7) 0.4 % vaginal cream Place 1 applicator vaginally at bedtime.   thiamine (VITAMIN B-1) 100 MG tablet Take 100 mg by mouth daily.   traMADol (ULTRAM) 50 MG tablet Take 50 mg by mouth 4 (four) times  daily as needed.   triamcinolone ointment (KENALOG) 0.1 % Apply to left nare daily as needed.   valsartan (DIOVAN) 320 MG tablet Take 1 tablet (320 mg total) by mouth daily.   vitamin B-12 (CYANOCOBALAMIN) 500 MCG tablet Take 500 mcg by mouth every other day.   [DISCONTINUED] metoprolol tartrate (LOPRESSOR) 50 MG tablet Take one (1) tablet by mouth ( 50 mg) tablet 2 hours prior to CT scan.   [DISCONTINUED] Tiotropium Bromide Monohydrate (SPIRIVA RESPIMAT) 1.25 MCG/ACT AERS Inhale 2 puffs into the lungs daily.     Allergies:   Caine-1 [lidocaine], Indomethacin, Other, Articaine-epinephrine, Gabapentin, Hydrocodone, Latex, and Lamisil [terbinafine]   Social History   Socioeconomic History   Marital status: Married    Spouse name: Not on file   Number of children: Not on file   Years of education: Not on file   Highest education level: Not on file  Occupational History   Not on file  Tobacco Use   Smoking status: Former    Current packs/day: 0.00    Average packs/day: 3.0 packs/day for 10.0 years (30.0 ttl pk-yrs)    Types: Cigarettes    Start date: 12/14/1972     Quit date: 12/15/1982    Years since quitting: 40.3   Smokeless tobacco: Never  Vaping Use   Vaping status: Never Used  Substance and Sexual Activity   Alcohol use: Yes    Alcohol/week: 7.0 standard drinks of alcohol    Types: 7 Standard drinks or equivalent per week   Drug use: No   Sexual activity: Yes    Partners: Male    Birth control/protection: Post-menopausal  Other Topics Concern   Not on file  Social History Narrative   Not on file   Social Determinants of Health   Financial Resource Strain: Not on file  Food Insecurity: Not on file  Transportation Needs: Not on file  Physical Activity: Not on file  Stress: Not on file  Social Connections: Unknown (01/23/2022)   [facility]   Social Network    Social Network: Not on file     Family History: The patient's family history includes Autoimmune disease in her sister; Cancer in her sister; Hypertension in her mother; Lupus in her brother.  ROS:   Please see the history of present illness.   All other systems reviewed and are negative.  Labs/Other Studies Reviewed:    The following studies were reviewed today:  CT Chest w/o Contrast 01/19/23 IMPRESSION: 1. No significant change in an area of consolidation and volume loss of the right pulmonary apex. Adjacent clustered nodularity and small consolidations of overall improved, however fluctuant. Findings are consistent with ongoing atypical infection and chronic underlying sequelae, particularly atypical Mycobacterium. 2. Additional, occasional small nodules and ground-glass opacities elsewhere unchanged. 3. Incidental note of a densely rim calcified aneurysm of the right renal artery or a branch arteriole measuring 1.3 x 1.2 cm. 4. Coronary artery disease.   Aortic Atherosclerosis (ICD10-I70.0).  Recent Labs: 08/19/2022: Hemoglobin 13.7; Platelets 256.0 01/17/2023: ALT 131 02/13/2023: BUN 8; Creatinine, Ser 0.72; Potassium 3.7; Sodium 136   Recent Lipid Panel No results found for: "CHOL", "TRIG", "HDL", "CHOLHDL", "VLDL", "LDLCALC", "LDLDIRECT"   Risk Assessment/Calculations:           Physical Exam:    VS:  BP (!) 160/78   Pulse 84   Ht 5\' 3"  (1.6 m)   Wt 111 lb 3.2 oz (50.4 kg)   LMP 12/14/1972   SpO2  99%   BMI 19.70 kg/m     Wt Readings from Last 3 Encounters:  04/10/23 111 lb 3.2 oz (50.4 kg)  04/06/23 111 lb 3.2 oz (50.4 kg)  02/07/23 109 lb (49.4 kg)     GEN:  Well nourished, well developed in no acute distress, appears younger than stated age HEENT: Normal NECK: No JVD; No carotid bruits CARDIAC: RRR, no murmurs, rubs, gallops RESPIRATORY:  Clear to auscultation without rales, wheezing or rhonchi  ABDOMEN: Soft, non-tender, non-distended MUSCULOSKELETAL:  No edema; No deformity. 2+ pedal pulses, equal bilaterally SKIN: Warm and dry NEUROLOGIC:  Alert and oriented x 3 PSYCHIATRIC:  Normal affect   EKG:  EKG is not ordered today  HYPERTENSION CONTROL Vitals:   04/10/23 0904 04/10/23 0922  BP: (!) 158/78 (!) 160/78    The patient's blood pressure is elevated above target today.  In order to address the patient's elevated BP:        Diagnoses:    1. Coronary artery disease involving native coronary artery of native heart without angina pectoris   2. Transaminitis   3. Aortic atherosclerosis (HCC)   4. Coronary artery calcification seen on CT scan   5. LBBB (left bundle branch block)   6. Tricuspid valve disease   7. Nonrheumatic mitral valve regurgitation   8. Nonrheumatic aortic valve insufficiency   9. Shortness of breath   10. Primary hypertension    Assessment and Plan:     Shortness of breath: She reports improvement. Only noting shortness of breath occasionally with moderate intensity exercise. No clear indication for cause of SOB on cardiac tests. We discussed that valve disease can cause some shortness of breath, however she does not have significant valve disease. She will  notify us if symptoms worsen.   LBBB: Known. Normal LVEF on echo 03/10/23. She denies lightheadedness, presyncope, syncope. Continue to follow with repeat echo in 1 to 2 years, sooner if clinically indicated.    CAD/Aortic atherosclerosis/Coronary artery calcification: Minimal CAD < 25% stenosis mRCA, mLAD with total plaque volume 72 (16th percentile), coronary calcium score 49.3 (42nd percentile) on CCTA 03/08/23. She denies chest pain, dyspnea, or other symptoms concerning for angina.  No indication for further ischemic evaluation at this time. Recommendation to start statin therapy for LDL goal 55 or lower. See notes below regarding transaminitis felt to be 2/2 TB treatment. She already exercises on a consistent basis and eats a heart healthy, low-cholesterol diet.  Continue secondary prevention.  Hypertension: History of whitecoat hypertension. BP is elevated today. Home BP has been elevated as well. Has been on increased dose of amlodipine for 6-8 weeks with some improvement.  We will change losartan to valsartan 320 mg daily.  Will have her return for BMP in 1 week.  Advised her to continue home BP monitoring and report any concerns.   Hyperlipidemia LDL goal < 55: LDL 89, HDL 105 on 05/20/2022. We discussed starting low dose statin for LDL goal < 55.  History of transaminitis on TB meds.  She is agreeable with this plan.  We will will await follow-up appointment in September for lab work following completion of TB treatment. Continue healthy diet and regular exercise.   Valve disease: Mild to moderate TR, mild AI and mild calcification of aortic valve with no stenosis, mild MR on echo 03/10/23. She is asymptomatic. We will continue to monitor clinically at this time.   Transaminitis: Elevated liver enzymes during treatment for TB. She reports improvement recently but I  do not have results to review. Plan is to take 2 more weeks of TB meds. We will repeat lab work when she returns to see Dr. Tenny Craw in  September. Plan to initiate statin medication is liver enzymes are stable.     Disposition: Keep your September appointment with Dr. Tenny Craw  Medication Adjustments/Labs and Tests Ordered: Current medicines are reviewed at length with the patient today.  Concerns regarding medicines are outlined above.  No orders of the defined types were placed in this encounter.  No orders of the defined types were placed in this encounter.   Patient Instructions  Medication Instructions:   Your physician recommends that you continue on your current medications as directed. Please refer to the Current Medication list given to you today.   *If you need a refill on your cardiac medications before your next appointment, please call your pharmacy*   Lab Work:  The day of your appointment with Dr. Tenny Craw please come in fasting after 12 midnight the night before.   If you have labs (blood work) drawn today and your tests are completely normal, you will receive your results only by: MyChart Message (if you have MyChart) OR A paper copy in the mail If you have any lab test that is abnormal or we need to change your treatment, we will call you to review the results.   Testing/Procedures:  None ordered.   Follow-Up: At [facility], you and your health needs are our priority.  As part of our continuing mission to provide you with exceptional heart care, we have created designated Provider Care Teams.  These Care Teams include your primary Cardiologist (physician) and Advanced Practice Providers (APPs -  Physician Assistants and Nurse Practitioners) who all work together to provide you with the care you need, when you need it.  We recommend signing up for the patient portal called "MyChart".  Sign up information is provided on this After Visit Summary.  MyChart is used to connect with patients for Virtual Visits (Telemedicine).  Patients are able to view lab/test results, encounter notes, upcoming  appointments, etc.  Non-urgent messages can be sent to your provider as well.   To learn more about what you can do with MyChart, go to [email or web address].    Your next appointment:   2 month(s)  Provider:   Dietrich Pates, MD     Other Instructions  HOW TO TAKE YOUR BLOOD PRESSURE  Rest 5 minutes before taking your blood pressure. Don't  smoke or drink caffeinated beverages for at least 30 minutes before. Take your blood pressure before (not after) you eat. Sit comfortably with your back supported and both feet on the floor ( don't cross your legs). Elevate your arm to heart level on a table or a desk. Use the proper sized cuff.  It should fit smoothly and snugly around your bare upper arm.  There should be  Enough room to slip a fingertip under the cuff.  The bottom edge of the cuff should be 1 inch above the crease Of the elbow. Please monitor your blood pressure once daily 2 hours after your am medication. If you blood pressure Consistently remains above 140 (systolic) top number or over 80 ( diastolic) bottom number X 3 days  Consecutively.  Please call our office at [phone] or send Mychart message.     ----Avoid cold medicines with D or DM at the end of them----       Signed, Greydon Betke, Zachary George, NP  04/10/2023 10:25 AM    [facility]

## 2023-04-03 ENCOUNTER — Telehealth: Payer: Self-pay | Admitting: *Deleted

## 2023-04-03 NOTE — Telephone Encounter (Signed)
Attempted Ortho bundle 1 year call to patient; left VM and requested call back.

## 2023-04-04 ENCOUNTER — Telehealth: Payer: Self-pay | Admitting: *Deleted

## 2023-04-04 NOTE — Telephone Encounter (Signed)
1 year Ortho bundle call completed. ?

## 2023-04-06 ENCOUNTER — Other Ambulatory Visit (HOSPITAL_COMMUNITY)
Admission: RE | Admit: 2023-04-06 | Discharge: 2023-04-06 | Disposition: A | Discharge status: Home / Self Care | Payer: Medicare Other | Source: Ambulatory Visit | Attending: Obstetrics & Gynecology | Admitting: Obstetrics & Gynecology

## 2023-04-06 ENCOUNTER — Ambulatory Visit (HOSPITAL_BASED_OUTPATIENT_CLINIC_OR_DEPARTMENT_OTHER): Payer: Medicare Other | Admitting: Obstetrics & Gynecology

## 2023-04-06 ENCOUNTER — Encounter (HOSPITAL_BASED_OUTPATIENT_CLINIC_OR_DEPARTMENT_OTHER): Payer: Self-pay | Admitting: Obstetrics & Gynecology

## 2023-04-06 ENCOUNTER — Ambulatory Visit
Admission: RE | Admit: 2023-04-06 | Discharge: 2023-04-06 | Disposition: A | Discharge status: Home / Self Care | Payer: Medicare Other | Source: Ambulatory Visit | Attending: Internal Medicine | Admitting: Internal Medicine

## 2023-04-06 VITALS — BP 158/62 | HR 73 | Ht 63.0 in | Wt 111.2 lb

## 2023-04-06 DIAGNOSIS — Z1231 Encounter for screening mammogram for malignant neoplasm of breast: Secondary | ICD-10-CM | POA: Diagnosis not present

## 2023-04-06 DIAGNOSIS — N761 Subacute and chronic vaginitis: Secondary | ICD-10-CM | POA: Diagnosis not present

## 2023-04-06 DIAGNOSIS — Z792 Long term (current) use of antibiotics: Secondary | ICD-10-CM

## 2023-04-06 MED ORDER — TERCONAZOLE 0.4 % VA CREA: 1.0000 | TOPICAL_CREAM | Freq: Every day | VAGINAL | 0 refills | Status: DC

## 2023-04-06 NOTE — Progress Notes (Signed)
77 y.o. [id number] Married White or Caucasian female here for complaint of vaginal itching/irritation.  She has hx TB diagnosed with bronchoscopy.  Being followed Dr. Marchelle Gearing who is not completely in agreement with diagnosis per her report.  Started medication in January.  6 month treatment recommended.  Liver enzymes went up and had to stop medications for awhile.  She should finish these in Aug, 2024.  She is still followed by the HD as well.  AST and ALT from 7/5 were 34 and 32.    She has been having intermittent vaginal itching.  Coconut oil does help.  This has really only been an issue for her since being on the antibiotics.    Denies vaginal bleeding.  Had BBB last year when knee was replaced.  Has upcoming appt with Dr. Tenny Craw.  Did have coronary CT in June.  She was in the 42%tile.    Patient's last menstrual period was 12/14/1972.            reports that she quit smoking about 40 years ago. Her smoking use included cigarettes. She started smoking about 50 years ago. She has a 30 pack-year smoking history. She has never used smokeless tobacco. She reports current alcohol use of about 7.0 standard drinks of alcohol per week. She reports that she does not use drugs.  Past Medical History:  Diagnosis Date   Aneurysm of renal artery (HCC)    followed at [facility], stable with CT/angiogram 10/2021   Fracture of right wrist 2011   Fell in bathroom.   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    Past Surgical History:  Procedure Laterality Date   ABDOMINOPLASTY  2010   APPENDECTOMY  1962   BREAST EXCISIONAL BIOPSY Bilateral 2001   x 3   BRONCHIAL WASHINGS  09/16/2022   Procedure: BRONCHIAL WASHINGS;  Surgeon: Kalman Shan, MD;   Location: [facility] ENDOSCOPY;  Service: Endoscopy;;   CATARACT EXTRACTION, BILATERAL Bilateral 01/2020   Dental implants  1993   HEMORRHOID SURGERY  2000   JOINT REPLACEMENT     KNEE ARTHROSCOPY W/ DEBRIDEMENT Right 07/2020   Dr. Luiz Blare   TOTAL KNEE ARTHROPLASTY Right 01/21/2022   Procedure: RIGHT TOTAL KNEE ARTHROPLASTY;  Surgeon: Kathryne Hitch, MD;  Location: [facility] ORS;  Service: Orthopedics;  Laterality: Right;  RFNA   VAGINAL HYSTERECTOMY  1974   Dysfunctional uterine bleeding.   VIDEO BRONCHOSCOPY N/A 09/16/2022   Procedure: VIDEO BRONCHOSCOPY WITHOUT FLUORO;  Surgeon: Kalman Shan, MD;  Location: [facility] ENDOSCOPY;  Service: Endoscopy;  Laterality: N/A;    Current Outpatient Medications  Medication Sig Dispense Refill   albuterol (VENTOLIN HFA) 108 (90 Base) MCG/ACT inhaler Inhale 2 puffs into the lungs every 6 (six) hours as needed for wheezing or shortness of breath. 8 g 2   amLODipine (NORVASC) 10 MG tablet Take 10 mg by mouth daily.     ascorbic acid (VITAMIN C) 500 MG tablet Take 500 mg by mouth daily.     botulinum toxin Type A (BOTOX) 100 units SOLR injection Inject into the skin.     budesonide (ENTOCORT EC) 3 MG 24 hr capsule Take 3 mg by  mouth daily as needed (colitis flare).     buPROPion (WELLBUTRIN XL) 150 MG 24 hr tablet Take 1 tablet (150 mg total) by mouth daily. 90 tablet 4   chlorhexidine (PERIDEX) 0.12 % solution SMARTSIG:By Mouth     Cholecalciferol (VITAMIN D) 50 MCG (2000 UT) tablet Take 2,000 Units by mouth daily.     clobetasol cream (TEMOVATE) 0.05 % APPLY TOPICALLY TO THE AFFECTED AREA TWICE DAILY. STOP WHEN SMOOTH. AVOID FACE AND BODY FOLDS     ferrous sulfate 325 (65 FE) MG EC tablet Take 325 mg by mouth every Monday.     finasteride (PROSCAR) 5 MG tablet Take 5 mg by mouth daily.     isoniazid (NYDRAZID) 300 MG tablet Take 300 mg by mouth daily.     L-CYSTEINE PO Take 500 mg by mouth daily.     Melatonin 5 MG TABS Take 5 mg by mouth at bedtime as needed  (sleep).     Menaquinone-7 (VITAMIN K2) 100 MCG CAPS Take 100 mcg by mouth daily.     metoprolol tartrate (LOPRESSOR) 50 MG tablet Take one (1) tablet by mouth ( 50 mg) tablet 2 hours prior to CT scan. 1 tablet 0   Multiple Vitamin (MULTIVITAMIN) tablet Take 1 tablet by mouth daily.     omeprazole (PRILOSEC) 20 MG capsule Take 20 mg by mouth 2 (two) times daily before a meal.     Potassium 99 MG TABS Take 99 mg by mouth daily.     Povidone, PF, (IVIZIA DRY EYES) 0.5 % SOLN Place 1 drop into both eyes at bedtime.     rifampin (RIFADIN) 150 MG capsule Take 420 mg by mouth daily.     SUMAtriptan (IMITREX) 50 MG tablet Take 50 mg by mouth every 2 (two) hours as needed for migraine.     terconazole (TERAZOL 7) 0.4 % vaginal cream Place 1 applicator vaginally at bedtime. 45 g 0   thiamine (VITAMIN B-1) 100 MG tablet Take 100 mg by mouth daily.     Tiotropium Bromide Monohydrate (SPIRIVA RESPIMAT) 1.25 MCG/ACT AERS Inhale 2 puffs into the lungs daily. 4 g 5   traMADol (ULTRAM) 50 MG tablet Take 50 mg by mouth 4 (four) times daily as needed.     traZODone (DESYREL) 50 MG tablet Take 50 mg by mouth at bedtime.     triamcinolone ointment (KENALOG) 0.1 % Apply to left nare daily as needed.     valsartan (DIOVAN) 320 MG tablet Take 1 tablet (320 mg total) by mouth daily. 90 tablet 3   vitamin B-12 (CYANOCOBALAMIN) 500 MCG tablet Take 500 mcg by mouth every other day.     No current facility-administered medications for this visit.    Family History  Problem Relation Age of Onset   Hypertension Mother    Cancer Sister        melanoma   Autoimmune disease Sister        crest and sjor   Lupus Brother     Review of Systems  Genitourinary:        Vaginal itching  All other systems reviewed and are negative.   Exam:   BP (!) 158/62 (BP Location: Right Arm, Patient Position: Sitting, Cuff Size: Normal)   Pulse 73   Ht 5\' 3"  (1.6 m) Comment: Reported  Wt 111 lb 3.2 oz (50.4 kg)   LMP 12/14/1972    BMI 19.70 kg/m   Height: 5\' 3"  (160 cm) (Reported) Physical Exam Constitutional:  Appearance: Normal appearance.  Abdominal:     Hernia: There is no hernia in the left inguinal area or right inguinal area.  Genitourinary:    Labia:        Right: No tenderness, lesion or injury.        Left: No tenderness, lesion or injury.      Urethra: No urethral lesion.     Vagina: Normal.     Comments: Uterus and cervix surgically absent. Lymphadenopathy:     Lower Body: No right inguinal adenopathy. No left inguinal adenopathy.  Skin:    General: Skin is warm.  Neurological:     General: No focal deficit present.     Mental Status: She is alert.    Chaperone, Ina Homes, CMA, was present for exam.  Assessment/Plan: 1. Chronic vaginitis - terconazole (TERAZOL 7) 0.4 % vaginal cream; Place 1 applicator vaginally at bedtime.  Dispense: 45 g; Refill: 0 - Cervicovaginal ancillary only( [place])  2. Long term (current) use of antibiotics

## 2023-04-07 LAB — CERVICOVAGINAL ANCILLARY ONLY: Candida Glabrata: NEGATIVE

## 2023-04-10 ENCOUNTER — Ambulatory Visit: Payer: Medicare Other | Attending: Nurse Practitioner | Admitting: Nurse Practitioner

## 2023-04-10 ENCOUNTER — Encounter: Payer: Self-pay | Admitting: Nurse Practitioner

## 2023-04-10 VITALS — BP 160/78 | HR 84 | Ht 63.0 in | Wt 111.2 lb

## 2023-04-10 DIAGNOSIS — R0602 Shortness of breath: Secondary | ICD-10-CM | POA: Diagnosis not present

## 2023-04-10 DIAGNOSIS — I10 Essential (primary) hypertension: Secondary | ICD-10-CM | POA: Insufficient documentation

## 2023-04-10 DIAGNOSIS — I079 Rheumatic tricuspid valve disease, unspecified: Secondary | ICD-10-CM | POA: Insufficient documentation

## 2023-04-10 DIAGNOSIS — I700 Atherosclerosis of aorta: Secondary | ICD-10-CM | POA: Diagnosis not present

## 2023-04-10 DIAGNOSIS — R7401 Elevation of levels of liver transaminase levels: Secondary | ICD-10-CM | POA: Diagnosis not present

## 2023-04-10 DIAGNOSIS — I447 Left bundle-branch block, unspecified: Secondary | ICD-10-CM | POA: Insufficient documentation

## 2023-04-10 DIAGNOSIS — I340 Nonrheumatic mitral (valve) insufficiency: Secondary | ICD-10-CM | POA: Insufficient documentation

## 2023-04-10 DIAGNOSIS — I2510 Atherosclerotic heart disease of native coronary artery without angina pectoris: Secondary | ICD-10-CM | POA: Diagnosis not present

## 2023-04-10 DIAGNOSIS — I351 Nonrheumatic aortic (valve) insufficiency: Secondary | ICD-10-CM | POA: Diagnosis not present

## 2023-04-10 NOTE — Patient Instructions (Signed)
Medication Instructions:   Your physician recommends that you continue on your current medications as directed. Please refer to the Current Medication list given to you today.   *If you need a refill on your cardiac medications before your next appointment, please call your pharmacy*   Lab Work:  The day of your appointment with Dr. Tenny Craw please come in fasting after 12 midnight the night before.   If you have labs (blood work) drawn today and your tests are completely normal, you will receive your results only by: MyChart Message (if you have MyChart) OR A paper copy in the mail If you have any lab test that is abnormal or we need to change your treatment, we will call you to review the results.   Testing/Procedures:  None ordered.   Follow-Up: At [facility], you and your health needs are our priority.  As part of our continuing mission to provide you with exceptional heart care, we have created designated Provider Care Teams.  These Care Teams include your primary Cardiologist (physician) and Advanced Practice Providers (APPs -  Physician Assistants and Nurse Practitioners) who all work together to provide you with the care you need, when you need it.  We recommend signing up for the patient portal called "MyChart".  Sign up information is provided on this After Visit Summary.  MyChart is used to connect with patients for Virtual Visits (Telemedicine).  Patients are able to view lab/test results, encounter notes, upcoming appointments, etc.  Non-urgent messages can be sent to your provider as well.   To learn more about what you can do with MyChart, go to [email or web address].    Your next appointment:   2 month(s)  Provider:   Dietrich Pates, MD     Other Instructions  HOW TO TAKE YOUR BLOOD PRESSURE  Rest 5 minutes before taking your blood pressure. Don't  smoke or drink caffeinated beverages for at least 30 minutes before. Take your blood pressure before (not  after) you eat. Sit comfortably with your back supported and both feet on the floor ( don't cross your legs). Elevate your arm to heart level on a table or a desk. Use the proper sized cuff.  It should fit smoothly and snugly around your bare upper arm.  There should be  Enough room to slip a fingertip under the cuff.  The bottom edge of the cuff should be 1 inch above the crease Of the elbow. Please monitor your blood pressure once daily 2 hours after your am medication. If you blood pressure Consistently remains above 140 (systolic) top number or over 80 ( diastolic) bottom number X 3 days  Consecutively.  Please call our office at [phone] or send Mychart message.     ----Avoid cold medicines with D or DM at the end of them----

## 2023-04-11 ENCOUNTER — Other Ambulatory Visit (HOSPITAL_BASED_OUTPATIENT_CLINIC_OR_DEPARTMENT_OTHER): Payer: Self-pay | Admitting: *Deleted

## 2023-04-11 MED ORDER — ESTRADIOL 0.1 MG/GM VA CREA: 1.0000 | TOPICAL_CREAM | VAGINAL | 0 refills | Status: DC

## 2023-04-13 DIAGNOSIS — L578 Other skin changes due to chronic exposure to nonionizing radiation: Secondary | ICD-10-CM | POA: Diagnosis not present

## 2023-04-13 DIAGNOSIS — L658 Other specified nonscarring hair loss: Secondary | ICD-10-CM | POA: Diagnosis not present

## 2023-04-13 DIAGNOSIS — R234 Changes in skin texture: Secondary | ICD-10-CM | POA: Diagnosis not present

## 2023-04-13 DIAGNOSIS — D229 Melanocytic nevi, unspecified: Secondary | ICD-10-CM | POA: Diagnosis not present

## 2023-04-13 DIAGNOSIS — R202 Paresthesia of skin: Secondary | ICD-10-CM | POA: Diagnosis not present

## 2023-04-19 DIAGNOSIS — J341 Cyst and mucocele of nose and nasal sinus: Secondary | ICD-10-CM | POA: Diagnosis not present

## 2023-04-19 DIAGNOSIS — F4321 Adjustment disorder with depressed mood: Secondary | ICD-10-CM | POA: Diagnosis not present

## 2023-04-19 DIAGNOSIS — M25552 Pain in left hip: Secondary | ICD-10-CM | POA: Diagnosis not present

## 2023-04-19 DIAGNOSIS — G894 Chronic pain syndrome: Secondary | ICD-10-CM | POA: Diagnosis not present

## 2023-04-19 DIAGNOSIS — M47816 Spondylosis without myelopathy or radiculopathy, lumbar region: Secondary | ICD-10-CM | POA: Diagnosis not present

## 2023-04-20 ENCOUNTER — Ambulatory Visit
Admission: RE | Admit: 2023-04-20 | Discharge: 2023-04-20 | Disposition: A | Discharge status: Home / Self Care | Payer: Medicare Other | Source: Ambulatory Visit | Attending: Obstetrics and Gynecology | Admitting: Obstetrics and Gynecology

## 2023-04-20 ENCOUNTER — Other Ambulatory Visit: Payer: Self-pay | Admitting: Obstetrics and Gynecology

## 2023-04-20 DIAGNOSIS — Z111 Encounter for screening for respiratory tuberculosis: Secondary | ICD-10-CM

## 2023-04-20 DIAGNOSIS — R918 Other nonspecific abnormal finding of lung field: Secondary | ICD-10-CM | POA: Diagnosis not present

## 2023-04-20 DIAGNOSIS — I10 Essential (primary) hypertension: Secondary | ICD-10-CM | POA: Diagnosis not present

## 2023-04-21 ENCOUNTER — Ambulatory Visit
Admission: RE | Admit: 2023-04-21 | Discharge: 2023-04-21 | Disposition: A | Discharge status: Home / Self Care | Payer: Medicare Other | Source: Ambulatory Visit | Attending: Obstetrics & Gynecology | Admitting: Obstetrics & Gynecology

## 2023-04-21 DIAGNOSIS — N958 Other specified menopausal and perimenopausal disorders: Secondary | ICD-10-CM | POA: Diagnosis not present

## 2023-04-21 DIAGNOSIS — E349 Endocrine disorder, unspecified: Secondary | ICD-10-CM | POA: Diagnosis not present

## 2023-04-21 DIAGNOSIS — M810 Age-related osteoporosis without current pathological fracture: Secondary | ICD-10-CM

## 2023-04-21 DIAGNOSIS — M8588 Other specified disorders of bone density and structure, other site: Secondary | ICD-10-CM | POA: Diagnosis not present

## 2023-05-02 ENCOUNTER — Ambulatory Visit (INDEPENDENT_AMBULATORY_CARE_PROVIDER_SITE_OTHER): Payer: Medicare Other | Admitting: Internal Medicine

## 2023-05-02 ENCOUNTER — Encounter: Payer: Self-pay | Admitting: Internal Medicine

## 2023-05-02 VITALS — BP 120/80 | HR 63 | Ht 63.0 in | Wt 110.0 lb

## 2023-05-02 DIAGNOSIS — Z201 Contact with and (suspected) exposure to tuberculosis: Secondary | ICD-10-CM | POA: Diagnosis not present

## 2023-05-02 DIAGNOSIS — R053 Chronic cough: Secondary | ICD-10-CM | POA: Diagnosis not present

## 2023-05-02 DIAGNOSIS — A159 Respiratory tuberculosis unspecified: Secondary | ICD-10-CM | POA: Diagnosis not present

## 2023-05-02 DIAGNOSIS — Z87891 Personal history of nicotine dependence: Secondary | ICD-10-CM | POA: Diagnosis not present

## 2023-05-02 DIAGNOSIS — R918 Other nonspecific abnormal finding of lung field: Secondary | ICD-10-CM | POA: Diagnosis not present

## 2023-05-02 NOTE — Patient Instructions (Addendum)
ICD-10-CM   1. 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 Exposure to TB  Z20.1     3. Lung nodules  R91.8     4. Chronic cough  R05.3     5. Former heavy cigarette smoker (20-39 per day)  Z87.891         -Glad you are feeling better and cough resolved after starting Mycobacterium tuberculosis treatment  in January 2024 [based on sputum culture result October 2023 and completing treatment August 2024 coordinated by the Department of Public Health  =Last CT chest May 2024 with fluctuating nodules c/w MTB  - Noted travel interest to [place] and [place] in [place] 2025  - discussed travel precautions for respiratory  Plan -Prevnar-20 05/02/2023 and no more after this  -High dose flu shot in fall  - covid mRNA vaccine on  your own  - mask during air travel and crowded indoors and avoid sick people - will give travel prednizone, antibioptic and paxlovid in May 2025  - do CT scan chest without contrast in 3 months  Follow-up - Return in 3 months to see Dr. Marchelle Gearing after CT chest  -RSI cough score at follow-up

## 2023-05-02 NOTE — Progress Notes (Signed)
OV 08/18/2022   OV 11/09/2021  Subjective:  Patient ID: Elaine Navarro, female , DOB: [birth year] , age 77 y.o. , MRN: [id number] , ADDRESS: [address] [place] [address] PCP Ileana Ladd, MD Patient Care Team: Ileana Ladd, MD as PCP - General (Family Medicine)  This Provider for this visit: Treatment Team:  Attending Provider: Kalman Shan, MD    11/09/2021 -   Chief Complaint  Patient presents with   Consult    Pt had a CT performed which is the reason for today's visit.     HPI Elaine Navarro 77 y.o. -presents with her husband.  Concern is left lower lobe nodule 4 mm seen on CT angiogram abdomen done for renal artery aneurysm.  She sees physician at [facility] for renal artery aneurysm.  During this time she had a CT scan of the abdomen done with contrast here in [place].  This showed a 4 mm lung nodule in the left lower lobe.  According to Dr. Dorothey Baseman it is stable over 10 years.  I personally visualized this.  Some bibasilar atelectasis also reported although I am not convinced about this.  Patient herself states that overall she is stable.  She says that in July 2022 she had COVID and after that she had significant cough.  Primary care treated with cough Tessalon Perles.  The cough is now resolved for the last 5 weeks.  Currently no undue shortness of breath.  No cough no wheezing no paroxysmal nocturnal dyspnea no orthopnea.  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  She is a remote 30 pack smoker having quit in 1984 when she moved from [place] to [place].  While in [place] as a child she got exposed to her both grandfather and aunt with tuberculosis.  She says she herself was skin test positive but denies any latent TB treatment.  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  But this was just general air pollution.  She is worried about the implications of all this for her health.  She says clinically her previous primary care physician told her she might have COPD.  She also is a brother with COPD.  She wants to be sure that there is no undue health issues with her lung.  She also wants to continue doing ballroom dancing at a championship level.    CT Chest data 10/21/21   Narrative & Impression  CLINICAL DATA:  Renal artery aneurysm   EXAM: CT ANGIOGRAPHY ABDOMEN   TECHNIQUE: Multidetector CT imaging of the abdomen was performed using the standard protocol during bolus administration of intravenous contrast. Multiplanar reconstructed images and MIPs were obtained and reviewed to evaluate the vascular anatomy.   RADIATION DOSE REDUCTION: This exam was performed according to the departmental dose-optimization program which includes automated exposure control, adjustment of the mA and/or kV according to patient size and/or use of iterative reconstruction technique.   CONTRAST:  75mL OMNIPAQUE IOHEXOL 350 MG/ML SOLN   COMPARISON:  CT abdomen dated August 13, 2012   FINDINGS: VASCULAR   Aorta: Normal caliber abdominal aorta with mild calcified plaque and no significant stenosis.   Celiac: Normal caliber celiac artery with no significant atherosclerotic disease or stenosis.   SMA: Visualized  SMA is normal in caliber with no significant atherosclerotic disease or stenosis.   Renals: Peripherally calcified renal artery aneurysm arising from a lower pole segmental branch measuring 1.1 x 1.1 x 1.3 cm, unchanged in size when compared to prior exam. Bilateral main renal arteries are normal in caliber with minimal calcified plaque and no stenosis.   IMA: Visualized portion is normal in caliber with no evidence of stenosis.   Veins: No venous abnormality.   Review of the MIP images confirms the above  findings.   NON-VASCULAR   Lower chest: Stable solid 4 mm nodule of the left lower lobe located on image 13, likely benign. Bibasilar atelectasis.   Hepatobiliary: No focal liver abnormality is seen. No gallstones, gallbladder wall thickening, or biliary dilatation.   Pancreas: Unremarkable. No pancreatic ductal dilatation or surrounding inflammatory changes.   Spleen: Normal in size without focal abnormality.   Adrenals/Urinary Tract: Adrenal glands are unremarkable. Kidneys are normal, without renal calculi, focal lesion, or hydronephrosis. Bladder is unremarkable.   Stomach/Bowel: Visualized portions of the small and large bowel are unremarkable.   Lymphatic: No pathologically enlarged lymph nodes seen in the abdomen.   Other: No abdominal wall hernia or abdominal ascites.   Musculoskeletal: No acute or significant osseous findings.   IMPRESSION: 1. 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 2.  Aortic Atherosclerosis (ICD10-I70.0).     Electronically Signed   By: Allegra Lai M.D.   On: 10/22/2021 10:03       12/28/2021 Follow up : Lung nodule  Patient returns for a 48-month follow-up.  Patient was seen for pulmonary consult November 09, 2021 for abnormal CT chest that showed 4 mm lung nodule.  Patient also has some mild shortness of breath with activities.  Has a family history of TB and COPD.  She is a former smoker.  Patient was set up for a high-resolution CT chest that showed no enlarged lymph nodes.  A 0.4 cm nodule in the left lower lobe that is stable compared back to 2012.  Consistent with a benign etiology.  Clustered nodularity and consolidation in the right apex with associated parenchymal scarring as well in the left upper lobe.  Findings are consistent with possible atypical infection such as atypical Mycobacterium.  Alpha-1 testing was normal level and phenotype.  Patient says she has very minimum cough if any.  Shortness of breath is  very mild in nature.  Patient was set up for pulmonary function testing that was normal that showed FEV1 at 125%, ratio 80, FVC 117%.,  No significant bronchodilator response, DLCO 82%. She says that she gets bronchitis once or twice a year.  Usually resolves pretty easily.  She says most days she has no cough at all.  Is not limited by activities.  She did have COVID-19 in July 2022. Patient does have colitis and has issues with weight loss at times.  Says occasionally she has some swallow issues.  She denies any hemoptysis, discolored mucus, fever.   IMPRESSION: 1. Small pulmonary nodules of the dependent left lower lobe are stable on examinations dating back to at least 09/15/2010 and definitively benign. No further follow-up or characterization is indicated for these nodules. 2. Clustered centrilobular nodularity and consolidation of the medial right pulmonary apex with some associated pleuroparenchymal scarring, as well as of the peripheral posterior left upper lobe. 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 These findings are new in comparison to remote prior CT of the chest  dated 09/15/2010.   Aortic Atherosclerosis (ICD10-I70.0).     Electronically Signed   By: Jearld Lesch M.D.   On: 12/17/2021 13:00     06/30/2022 Patient presents today for acute office visit. She has a history of pulmonary nodules.  She had a CT in April 2023 that showed findings atypical mycobacterium. She has had a cough since August.  Primary care prescribed rifampin, ethambutol and azithromycin recently d/t her symptoms and image findings. She developed white tongue and sore throat after taking medication for 3 days. She did notice improvement in cough while on abx. She has had no sputum cultures. She has never had a bronchoscopy. No recent chest imaging. She plans to travel to [place] on 07/09/22. No significant shortness of breath except with coughing fits.  Cough can be productive with white-yellow phlegm. She is afebrile.    Subjective:  Patient ID: Elaine Navarro, female , DOB: 07-28-1946 , age 36 y.o. , MRN: [id number] , ADDRESS: [address] [place] [address] PCP Thana Ates, MD Patient Care Team: Thana Ates, MD as PCP - General (Internal Medicine)  This Provider for this visit: Treatment Team:  Attending Provider: Kalman Shan, MD    08/18/2022 -   Chief Complaint  Patient presents with   Follow-up    Pt states she is still coughing and also has chest congestion as well as tightness in chest. States her cough is worse in the evening and states she is coughing up white phlegm.     HPI Elaine K Stahlman 77 y.o. -returns for follow-up.  Presents with her husband.  I saw her earlier in the year in 2023.  After that she has had visits with the nurse practitioner.  She tells me that the cough that started in the summer 2022 after COVID still persist.  After I saw her I ordered CT scan of the chest that showed nodularity that was clustered.  Her primary care physician empirically prescribed a triple antibiotic therapy for MAI infection.  However she developed a coated tongue.  Also given the empiric nature of the diagnosis we recommended she stop it.  Which she did.  After that in October 2023 she saw a Publishing rights manager.  Sputum culture grew haemophilus influenza and Candida.  She was given Z-Pak.  She did leave for her trip to [place] on 07/09/2022 but the trip got changed because of the [place] war.  Instead she went to [place] [employer] and from there it was a [place] cruise from 07/11/2022.  She travel to the [place] in [place].  She went to [place] she went to [place] and she came back to [place] via [place] on 07/24/2022.  Shortly after arriving she got COVID-19 for the second time.  She believes she contracted this in the flight.  During the trip her cough is significantly  worse rated at 8 out of 9.  The Z-Pak that she was prescribed she took it at that time and it did help but currently she is back with residual cough at the level 5 out of 10.  Chest feels tight.  There is occasional white stuff.  Particularly sputum.  She is quite miserable with the symptoms.  She also has a runny nose for the last 5 weeks.  She wants her symptoms addressed.  Labs show normal QuantiFERON gold but no allergy workup. Alpha-1 antitrypsin is normal. She is a former 30 pack smoking history.   Of note for  this visit I personally visualized the CT scan with the previous CT scan from April 2023.     OV 10/11/2022  Subjective:  Patient ID: Elaine Navarro, female , DOB: 1945-10-23 , age 89 y.o. , MRN: [id number] , ADDRESS: [address] [place] [facility] [address] PCP Thana Ates, MD Patient Care Team: Thana Ates, MD as PCP - General (Internal Medicine)  This Provider for this visit: Treatment Team:  Attending Provider: Kalman Shan, MD    10/11/2022 -   Chief Complaint  Patient presents with   Follow-up    Cough is improving. She has not started her spiriva due to high cost. Her cough is occ prod with white sputum. Appetite is good. No fevers, sweats.     HPI Elaine K Millington 77 y.o. -returns for follow-up.  At this point in time under the coordination of Department of Public Health she is completed 2 weeks of Mycobacterium tuberculosis treatment and date labs of therapy.  She already states that her cough is improved significantly her chest tightness is improved significantly although residual symptoms are still there.  She is feeling better she is tolerating the antituberculous treatment well.  She is no longer considered contagious and has been allowed to go in public according to her history.  Her husband is with her but he is not providing any independent history today.  The test sputum culture was done by nurse practitioner in October 2023.  Right  prior to starting the antituberculous treatment we did a CT scan of the chest and a pulmonary right upper lobe nodules were actually getting worse.  A bronchoscopy was done early January 2024 and the nucleocapsid amplification of Mycobacterium tuberculosis negative but the culture is still pending and so far no growth.  Nevertheless she is feeling better.  Of note she had some mild emphysema on the CT scan we ordered Spiriva but it is too expensive and she is feeling better so we told her to hold off.   Her current response to typical treatment and terms of cough is profile below     CT cest 09/21/22   Narrative & Impression  CLINICAL DATA:  77 year old female presents for evaluation of chronic productive cough.   EXAM: CT CHEST WITHOUT CONTRAST   TECHNIQUE: Multidetector CT imaging of the chest was performed following the standard protocol without IV contrast.   RADIATION DOSE REDUCTION: This exam was performed according to the departmental dose-optimization program which includes automated exposure control, adjustment of the mA and/or kV according to patient size and/or use of iterative reconstruction technique.   COMPARISON:  December 17, 2021   FINDINGS: Cardiovascular: Calcified aortic atherosclerosis is mild. Normal caliber of the thoracic aorta. Normal heart size. Scattered coronary artery calcifications. Normal caliber of central pulmonary vasculature.   Mediastinum/Nodes: LEFT thyroid nodule up to 2.3 cm with areas of calcification. No adenopathy in the chest. Esophagus is grossly normal by CT.   Lungs/Pleura: Biapical scarring. More confluent area of nodularity in the RIGHT upper lobe has increased considerably since previous imaging 3.0 x 1.3 cm. Previously this was confined to an area along the medial and lateral surface of the apical pleural surfaces.   There are diffuse areas of nodularity in the RIGHT upper lobe metabolic so increased, some areas new since  previous imaging. Juxtapleural nodule in the anterior RIGHT upper lobe (image 36/5) 8 mm, this is a new finding. Grouped nodules in the RIGHT upper lobe and some tree in bud on  images 36 through 41 are also new.   Some improvement with respect to bronchovascular nodularity extending towards the RIGHT lung apex that was seen in the medial RIGHT upper lobe on the previous study. No signs of additional consolidative process or evidence of pleural effusion. Mild LEFT apical scarring.   Stable LEFT lower lobe nodule (image 132/5) 4 mm. Airways are patent. No consolidation or sign of pleural effusion.   Upper Abdomen: No acute upper abdominal findings to the extent evaluated.   Musculoskeletal: No chest wall mass.  Spinal degenerative changes.   IMPRESSION: 1. Resolution of some areas of nodularity with worsening of peripheral areas of nodularity in the RIGHT upper lobe. Findings favor sequela of infection perhaps MAI or atypical infection. 2. More confluent area of nodularity also having developed since previous imaging. Would suggest short interval follow-up to ensure that this area shows improvement and does not show persistent underlying masslike area or nodule. Would also consider correlation with respiratory symptoms and pulmonology consultation if not yet obtained. 3. LEFT thyroid nodule up to 2.3 cm with areas of calcification. Suggest follow-up thyroid ultrasound if not yet obtained. 4. Aortic atherosclerosis and coronary artery disease.   Aortic Atherosclerosis (ICD10-I70.0).     Electronically Signed   By: Donzetta Kohut M.D.   On: 09/22/2022 14:09     OV 01/17/2023  Subjective:  Patient ID: Elaine Navarro, female , DOB: [birth year] , age 72 y.o. , MRN: [id number] , ADDRESS: [address] [place] [address] PCP Thana Ates, MD Patient Care Team: Thana Ates, MD as PCP - General (Internal Medicine)  This Provider for this visit: Treatment Team:   Attending Provider: Kalman Shan, MD  #Former heavy smoker 30 pack - #Right upper lobe nodule April 2023 worse in January 2024  -Sputum culture + October 2023 for Mycobacterium tuberculosis and also haemophilus  -Multiple QuantiFERON gold negative  -exposed to tuberculosis as an infant from her grandfather in the 1940s  01/17/2023 -   Chief Complaint  Patient presents with   Follow-up    CT F/up     HPI Elaine K Taglieri 77 y.o. -returns for follow-up.  Presents with her husband was an independent historian.  She now reports that her cough is even better with continued antituberculous treatment but beginning December 20, 2022 while on antituberculous treatment with close monitoring by the Department of Public Health she started having transaminitis.  Her AST went up to 84 and ALT went up to 108.  Then on Jan 13, 2023 AST was 114 and ALT was 144 and then on Jan 15, 2023 the antituberculous treatment was stopped.  She is not having any jaundice or discolored urine.  She has gained a little weight.  She says the cough is actually better.  She says she is getting good support from the [employer].  However she is worried about drug-induced liver injury.  Husband states she is really worried about this.  She is reading things on the Internet.  She says the public health department might switch her to another antituberculous treatment.  They are also requesting a right upper quadrant ultrasound.  She had CT scan of the chest May 2025 personally visualized it.  I am not fully sure if she is significantly improved or not compared to a year ago.  I asked for repeat read.  I did show her the CT report.  I also showed her the CT images.  In addition she did  have some vaginal yeast infection a month ago and took Diflucan pessary but now the vaginal itch is back.  Started having some pain in the right low back for the last 1 week.  She is got some dry mouth for the last 1 month.  She will  discuss this with primary care physician.       CT chest 01/13/23  Narrative & Impression  CLINICAL DATA:  Chronic cough, former smoker   EXAM: CT CHEST WITHOUT CONTRAST   TECHNIQUE: Multidetector CT imaging of the chest was performed following the standard protocol without IV contrast.   RADIATION DOSE REDUCTION: This exam was performed according to the departmental dose-optimization program which includes automated exposure control, adjustment of the mA and/or kV according to patient size and/or use of iterative reconstruction technique.   COMPARISON:  CT chest, 09/21/2022 thyroid ultrasound, 05/12/2022   FINDINGS: Cardiovascular: Aortic atherosclerosis. Normal heart size. Left and right coronary artery calcifications. No pericardial effusion.   Mediastinum/Nodes: No enlarged mediastinal, hilar, or axillary lymph nodes. Thyroid gland, trachea, and esophagus demonstrate no significant findings.   Lungs/Pleura: No significant change in an area of consolidation and volume loss of the right pulmonary apex (series 8, image 5). Adjacent clustered nodularity and small consolidations of overall improved, however are fluctuant, with a new nodule medially measuring 0.5 cm (series 8, image 22). Additional, occasional small nodules and ground-glass opacities elsewhere, for example a 0.4 cm nodule of the dependent left lower lobe, are unchanged (series 8, image 124). No pleural effusion or pneumothorax.   Upper Abdomen: No acute abnormality. Densely rim calcified aneurysm of the right renal artery or a branch arteriole measuring 1.3 x 1.2 cm (series 2, image 156).   Musculoskeletal: No chest wall abnormality. No acute osseous findings.   IMPRESSION: 1. No significant change in an area of consolidation and volume loss of the right pulmonary apex. Adjacent clustered nodularity and small consolidations of overall improved, however fluctuant. Findings are consistent with ongoing  atypical infection and chronic underlying sequelae, particularly atypical Mycobacterium. 2. Additional, occasional small nodules and ground-glass opacities elsewhere unchanged. 3. Incidental note of a densely rim calcified aneurysm of the right renal artery or a branch arteriole measuring 1.3 x 1.2 cm. 4. Coronary artery disease.   Aortic Atherosclerosis (ICD10-I70.0).     Electronically Signed   By: Jearld Lesch M.D.   On: 01/17/2023 12:18    OV 05/02/2023  Subjective:  Patient ID: Elaine Navarro, female , DOB: 1945-09-24 , age 8 y.o. , MRN: [id number] , ADDRESS: [address] [place] [address] PCP Thana Ates, MD Patient Care Team: Thana Ates, MD as PCP - General (Internal Medicine) Pricilla Riffle, MD as PCP - Cardiology (Cardiology)  This Provider for this visit: Treatment Team:  Attending Provider: Kalman Shan, MD    05/02/2023 -   Chief Complaint  Patient presents with   Follow-up    F/up on TB exposure     HPI Elaine K Coach 33 y.o. -followup MTB/ Discussed in cased conference June 2024: Jan 13, 2023 and Sep 21, 2022 -> [place] and waingin CT but there are areas of improvement but also new areas of nodulairty.Overall slightly better.  Has also seen ID Dr Rutha Bouchard - he supports Dx of MTB/ Then 04/10/23 saw cardiology: reported improvement in DOE. Co Caslcium was 42nd percentile.  She presents now with her husband for follow-up.  She says her cough is completely resolved.  She is feeling great.  Her weight is good.  No shortness of breath no wheezing.  She completed direct observed therapy by the public health department on Friday, 04/28/2023.  She said they did a chest x-ray was clear.  She wants to have a CT scan of the chest.  We discussed that our case conference and I shared these findings with her.  She and her husband wanted know about travel history.  I did tell them she is free to travel.  They are going to go to [place] in [place] in May  2025 for their 60th wedding anniversary.  She also was asking about travel advice particularly masking around planes.  We went over this.  I did advise her to mask in airports and planes and in any indoor cluster.  Did advise her to be up-to-date with all her vaccines.  Did indicate that I will give her prednisone Paxlovid and antibiotic for travel to [place].  We went over her pneumonia vaccination history.  She is opted to take the Prevnar 20 today.  She will have flu shot and COVID shot on her own this fall 2024.     reatment started 09/21/2022 ([facility]) Has been on consolidation with RI Lft rising to 150s and tx hold 5/06   5/02 cbc 4.7/14/237; lft 114/144/94/0.4 (ast/alt/alkphos/tbili) 5/14 lft 90/123/87/0.5     Cultures: 1/02 afb sputum -- few mtb (S inh mic 0.1, rifampin mic 1, ethambutol mic 5; pza not done as not available currently) 2/14 sputum cx -- few mtb 4/03 sputum cx -- ngtd 4/04 sputum cx -- ngtd 4/17 sputum cx -- ngtd 4/18 sputum cx -- ngtd        Dr Gretta Cool Reflux Symptom Index (> 13-15 suggestive of LPR cough) Pre RX  - ATb Rx Post start ATbx Rx - 2 weeks  Hoarseness of problem with voice 5 2  Clearing  Of Throat 5 3  Excess throat mucus or feeling of post nasal drip 5 3  Difficulty swallowing food, liquid or tablets 3 2  Cough after eating or lying down 5 3  Breathing difficulties or choking episodes 4 2  Troublesome or annoying cough 5 3  Sensation of something sticking in throat or lump in throat 5 2  Heartburn, chest pain, indigestion, or stomach acid coming up 3 2  TOTAL 40 21      PFT     Latest Ref Rng & Units 12/28/2021   10:42 AM  ILD indicators  FVC-Pre L 3.18   FVC-Predicted Pre % 121   FVC-Post L 3.07   FVC-Predicted Post % 117   TLC L 4.40   TLC Predicted % 89   DLCO uncorrected ml/min/mmHg 15.17   DLCO UNC %Pred % 82   DLCO Corrected ml/min/mmHg 15.17   DLCO COR %Pred % 82       LAB RESULTS last 96 hours No results found.  LAB  RESULTS last 90 days Recent Results (from the past 2160 hour(s))  Basic Metabolic Panel (BMET)     Status: Abnormal   Collection Time: 02/13/23 12:01 PM  Result Value Ref Range   Glucose 67 (L) 70 - 99 mg/dL   BUN 8 8 - 27 mg/dL   Creatinine, Ser 9.60 0.57 - 1.00 mg/dL   eGFR 86 >45 WU/JWJ/1.91   BUN/Creatinine Ratio 11 (L) 12 - 28   Sodium 136 134 - 144 mmol/L   Potassium 3.7 3.5 - 5.2 mmol/L   Chloride 100 96 - 106 mmol/L   CO2 24  20 - 29 mmol/L   Calcium 9.0 8.7 - 10.3 mg/dL  ECHOCARDIOGRAM COMPLETE     Status: None   Collection Time: 03/10/23 11:57 AM  Result Value Ref Range   Area-P 1/2 2.74 cm2   S' Lateral 2.20 cm   P 1/2 time 501 msec   Est EF 55 - 60%   Cervicovaginal ancillary only( [place])     Status: None   Collection Time: 04/06/23  2:05 PM  Result Value Ref Range   Bacterial Vaginitis (gardnerella) Negative    Candida Vaginitis Negative    Candida Glabrata Negative    Comment      Normal Reference Range Bacterial Vaginosis - Negative   Comment Normal Reference Range Candida Species - Negative    Comment Normal Reference Range Candida Galbrata - Negative          has a past medical history of Aneurysm of renal artery (HCC), Fracture of right wrist (2011), GERD (gastroesophageal reflux disease), Hypertension, Left bundle branch block, Left thyroid nodule (2003), Lymphocytic colitis (03/2013), Migraines, Osteoporosis, PONV (postoperative nausea and vomiting), Sciatica, and Visceral injury.   reports that she quit smoking about 40 years ago. Her smoking use included cigarettes. She started smoking about 50 years ago. She has a 30 pack-year smoking history. She has never used smokeless tobacco.  Past Surgical History:  Procedure Laterality Date   ABDOMINOPLASTY  2010   APPENDECTOMY  1962   BREAST EXCISIONAL BIOPSY Bilateral 2001   x 3   BRONCHIAL WASHINGS  09/16/2022   Procedure: BRONCHIAL WASHINGS;  Surgeon: Kalman Shan, MD;  Location: [facility] ENDOSCOPY;   Service: Endoscopy;;   CATARACT EXTRACTION, BILATERAL Bilateral 01/2020   Dental implants  1993   HEMORRHOID SURGERY  2000   JOINT REPLACEMENT     KNEE ARTHROSCOPY W/ DEBRIDEMENT Right 07/2020   Dr. Luiz Blare   TOTAL KNEE ARTHROPLASTY Right 01/21/2022   Procedure: RIGHT TOTAL KNEE ARTHROPLASTY;  Surgeon: Kathryne Hitch, MD;  Location: [facility] ORS;  Service: Orthopedics;  Laterality: Right;  RFNA   VAGINAL HYSTERECTOMY  1974   Dysfunctional uterine bleeding.   VIDEO BRONCHOSCOPY N/A 09/16/2022   Procedure: VIDEO BRONCHOSCOPY WITHOUT FLUORO;  Surgeon: Kalman Shan, MD;  Location: [facility] ENDOSCOPY;  Service: Endoscopy;  Laterality: N/A;    Allergies  Allergen Reactions   Caine-1 [Lidocaine] Swelling    Allergic to Septocaine Articaine HCL 4% w/epi   Indomethacin Swelling   Other Diarrhea    Dairy Products    Articaine-Epinephrine Other (See Comments)   Gabapentin     "Twitches"   Hydrocodone Nausea Only   Latex Swelling    Possible allergy   Lamisil [Terbinafine] Rash    Immunization History  Administered Date(s) Administered   Fluad Quad(high Dose 65+) 05/29/2022   Hepatitis A 03/10/2003, 10/15/2003   Hepatitis B 04/15/2003, 05/21/2003, 10/15/2003   Influenza Split 06/09/2014, 06/08/2015, 06/08/2017, 06/19/2018, 05/27/2019, 05/18/2020, 05/31/2021   Influenza, High Dose Seasonal PF 06/19/2018   Influenza,inj,Quad PF,6+ Mos 06/13/2011   Influenza-Unspecified 05/16/2013   PFIZER(Purple Top)SARS-COV-2 Vaccination 10/01/2019, 10/21/2019, 05/07/2020, 12/15/2020, 05/31/2021, 06/01/2022   Pneumococcal Conjugate-13 10/18/2013   Pneumococcal Polysaccharide-23 06/22/2010   Respiratory Syncytial Virus Vaccine,Recomb Aduvanted(Arexvy) 05/11/2022   Td 03/10/2003   Tdap 11/08/2010, 12/29/2019, 01/11/2020   Typhoid Inactivated 03/10/2003, 07/05/2013   Yellow Fever 07/05/2013   Zoster, Live 01/19/2006, 12/21/2016, 06/26/2017    Family History  Problem Relation Age of Onset    Hypertension Mother    Cancer Sister  melanoma   Autoimmune disease Sister        crest and sjor   Lupus Brother      Current Outpatient Medications:    albuterol (VENTOLIN HFA) 108 (90 Base) MCG/ACT inhaler, Inhale 2 puffs into the lungs every 6 (six) hours as needed for wheezing or shortness of breath., Disp: 8 g, Rfl: 2   amLODipine (NORVASC) 10 MG tablet, Take 10 mg by mouth daily., Disp: , Rfl:    ascorbic acid (VITAMIN C) 500 MG tablet, Take 500 mg by mouth daily., Disp: , Rfl:    botulinum toxin Type A (BOTOX) 100 units SOLR injection, Inject into the skin., Disp: , Rfl:    budesonide (ENTOCORT EC) 3 MG 24 hr capsule, Take 3 mg by mouth daily as needed (colitis flare)., Disp: , Rfl:    buPROPion (WELLBUTRIN XL) 150 MG 24 hr tablet, Take 1 tablet (150 mg total) by mouth daily., Disp: 90 tablet, Rfl: 4   chlorhexidine (PERIDEX) 0.12 % solution, SMARTSIG:By Mouth, Disp: , Rfl:    Cholecalciferol (VITAMIN D) 50 MCG (2000 UT) tablet, Take 2,000 Units by mouth daily., Disp: , Rfl:    clobetasol cream (TEMOVATE) 0.05 %, APPLY TOPICALLY TO THE AFFECTED AREA TWICE DAILY. STOP WHEN SMOOTH. AVOID FACE AND BODY FOLDS, Disp: , Rfl:    ferrous sulfate 325 (65 FE) MG EC tablet, Take 325 mg by mouth every Monday., Disp: , Rfl:    finasteride (PROSCAR) 5 MG tablet, Take 5 mg by mouth daily., Disp: , Rfl:    L-CYSTEINE PO, Take 500 mg by mouth daily., Disp: , Rfl:    Melatonin 5 MG TABS, Take 5 mg by mouth at bedtime as needed (sleep)., Disp: , Rfl:    Menaquinone-7 (VITAMIN K2) 100 MCG CAPS, Take 100 mcg by mouth daily., Disp: , Rfl:    Multiple Vitamin (MULTIVITAMIN) tablet, Take 1 tablet by mouth daily., Disp: , Rfl:    omeprazole (PRILOSEC) 20 MG capsule, Take 20 mg by mouth 2 (two) times daily before a meal., Disp: , Rfl:    Potassium 99 MG TABS, Take 99 mg by mouth daily., Disp: , Rfl:    Povidone, PF, (IVIZIA DRY EYES) 0.5 % SOLN, Place 1 drop into both eyes at bedtime., Disp: , Rfl:     SUMAtriptan (IMITREX) 50 MG tablet, Take 50 mg by mouth every 2 (two) hours as needed for migraine., Disp: , Rfl:    terconazole (TERAZOL 7) 0.4 % vaginal cream, Place 1 applicator vaginally at bedtime., Disp: 45 g, Rfl: 0   traMADol (ULTRAM) 50 MG tablet, Take 50 mg by mouth 4 (four) times daily as needed., Disp: , Rfl:    triamcinolone ointment (KENALOG) 0.1 %, Apply to left nare daily as needed., Disp: , Rfl:    valsartan (DIOVAN) 320 MG tablet, Take 1 tablet (320 mg total) by mouth daily., Disp: 90 tablet, Rfl: 3   vitamin B-12 (CYANOCOBALAMIN) 500 MCG tablet, Take 500 mcg by mouth every other day., Disp: , Rfl:       Objective:   Vitals:   05/02/23 0919  BP: 120/80  Pulse: 63  SpO2: 97%  Weight: 110 lb (49.9 kg)  Height: 5\' 3"  (1.6 m)    Estimated body mass index is 19.49 kg/m as calculated from the following:   Height as of this encounter: 5\' 3"  (1.6 m).   Weight as of this encounter: 110 lb (49.9 kg).  @WEIGHTCHANGE @  Filed Weights   05/02/23 0919  Weight:  110 lb (49.9 kg)     Physical Exam   General: No distress. Looks well O2 at rest: no Cane present: no Sitting in wheel chair: no Frail: no Obese: no Neuro: Alert and Oriented x 3. GCS 15. Speech normal Psych: Pleasant Resp:  Barrel Chest - no.  Wheeze - no, Crackles - no, No overt respiratory distress CVS: Normal heart sounds. Murmurs - no Ext: Stigmata of Connective Tissue Disease - no HEENT: Normal upper airway. PEERL +. No post nasal drip        Assessment:       ICD-10-CM   1. MTB (Mycobacterium tuberculosis infection)  A15.9 CT Chest Wo Contrast    2. Exposure to TB  Z20.1     3. Lung nodules  R91.8 CT Chest Wo Contrast    4. Chronic cough  R05.3     5. Former heavy cigarette smoker (20-39 per day)  Z87.891          Plan:     Patient Instructions     ICD-10-CM   1. 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 Exposure to TB  Z20.1     3. Lung nodules  R91.8      4. Chronic cough  R05.3     5. Former heavy cigarette smoker (20-39 per day)  Z87.891         -Glad you are feeling better and cough resolved after starting Mycobacterium tuberculosis treatment  in January 2024 [based on sputum culture result October 2023 and completing treatment August 2024 coordinated by the Department of Public Health  =Last CT chest May 2024 with fluctuating nodules c/w MTB  - Noted travel interest to [place] and [place] in [place] 2025  - discussed travel precautions for respiratory  Plan -Prevnar-20 05/02/2023 and no more after this  -High dose flu shot in fall  - covid mRNA vaccine on  your own  - mask during air travel and crowded indoors and avoid sick people - will give travel prednizone, antibioptic and paxlovid in May 2025  - do CT scan chest without contrast in 3 months  Follow-up - Return in 3 months to see Dr. Marchelle Gearing after CT chest  -RSI cough score at follow-up   FOLLOWUP Return in about 3 months (around 08/02/2023) for 15 min visit, after HRCT chest, Face to Face Visit.  (Level 04 E&M 2024: Estb >= 30 min visit type: on-site physical face to visit visit spent in total care time and counseling or/and coordination of care by this undersigned MD - Dr Kalman Shan. This includes one or more of the following on this same day 05/02/2023: pre-charting, chart review, note writing, documentation discussion of test results, diagnostic or treatment recommendations, prognosis, risks and benefits of management options, instructions, education, compliance or risk-factor reduction. It excludes time spent by the CMA or office staff in the care of the patient . Actual time is 30 min)   SIGNATURE    Dr. Kalman Shan, M.D., F.C.C.P,  Pulmonary and Critical Care Medicine Staff Physician, [facility] Center Director - Interstitial Lung Disease  Program  Pulmonary Fibrosis [facility] at [facility] [place], [place],  [address]  Pager: [phone], If no answer or between  15:00h - 7:00h: call [phone] Telephone: [phone]  10:04 AM 05/02/2023

## 2023-05-12 ENCOUNTER — Other Ambulatory Visit: Payer: Self-pay | Admitting: Internal Medicine

## 2023-05-12 DIAGNOSIS — E871 Hypo-osmolality and hyponatremia: Secondary | ICD-10-CM | POA: Diagnosis not present

## 2023-05-12 DIAGNOSIS — E042 Nontoxic multinodular goiter: Secondary | ICD-10-CM | POA: Diagnosis not present

## 2023-05-22 ENCOUNTER — Ambulatory Visit
Admission: RE | Admit: 2023-05-22 | Discharge: 2023-05-22 | Disposition: A | Discharge status: Home / Self Care | Payer: Medicare Other | Source: Ambulatory Visit | Attending: Internal Medicine | Admitting: Internal Medicine

## 2023-05-22 ENCOUNTER — Other Ambulatory Visit: Payer: Medicare Other

## 2023-05-22 DIAGNOSIS — E042 Nontoxic multinodular goiter: Secondary | ICD-10-CM

## 2023-05-24 ENCOUNTER — Encounter: Payer: Self-pay | Admitting: Internal Medicine

## 2023-05-24 ENCOUNTER — Other Ambulatory Visit: Payer: Self-pay

## 2023-05-24 ENCOUNTER — Ambulatory Visit: Payer: Medicare Other | Attending: Internal Medicine | Admitting: Internal Medicine

## 2023-05-24 VITALS — BP 130/70 | HR 68 | Ht 62.75 in | Wt 111.2 lb

## 2023-05-24 DIAGNOSIS — I2510 Atherosclerotic heart disease of native coronary artery without angina pectoris: Secondary | ICD-10-CM | POA: Diagnosis not present

## 2023-05-24 NOTE — Progress Notes (Signed)
Cardiology Office Note   Date:  05/24/2023   ID:  Elaine Navarro, Elaine Navarro 24-Sep-1945, MRN [id number]  PCP:  Thana Ates, MD  Cardiologist:   Dietrich Pates, MD    Pt referred for evaluation of abnormal EKG   LBBB   History of Present Illness: Elaine Navarro is a 77 y.o. female with a history of HTN and LBBB   I saw her for the first time in 2023 as part of a preop evaluation  The pt is being evaluated for surgery on R knee and had new LBBB   She was extremely active doing competitive dancing    No problems except for knee   Given high level of aerobicactiivty no further testing done   She was seen by Benjamine Sprague in May 2024   Dx with TB in Jan 2024   She continued  dancing  bit did note more SOB  CT of chest showed coronary calcifications and aortic atherosclerosis    With this sehe was set up for CCTA which showed calcium score of 49.3   Minimal plaquing       She continued to dance completitively   Seen again in July 2024 for BP elevation   130s    In clinic that day it wa even higher   Amlodpoine had been increased   At that visit losartan was switched to valsartan     Pt is vegetarian     Since seen she deneis CP  Breathing is now good   no dizziness The pt says she is dancing   Very active   20 min [employer] without problem yesterday    Being evaluated for thyroid nodules   USN done yesterday     Current Meds  Medication Sig   albuterol (VENTOLIN HFA) 108 (90 Base) MCG/ACT inhaler Inhale 2 puffs into the lungs every 6 (six) hours as needed for wheezing or shortness of breath.   amLODipine (NORVASC) 10 MG tablet Take 10 mg by mouth daily.   ascorbic acid (VITAMIN C) 500 MG tablet Take 500 mg by mouth daily.   botulinum toxin Type A (BOTOX) 100 units SOLR injection Inject into the skin.   budesonide (ENTOCORT EC) 3 MG 24 hr capsule Take 3 mg by mouth daily as needed (colitis flare).   buPROPion (WELLBUTRIN XL) 150 MG 24 hr tablet Take 1 tablet (150 mg total) by mouth daily.    chlorhexidine (PERIDEX) 0.12 % solution SMARTSIG:By Mouth   Cholecalciferol (VITAMIN D) 50 MCG (2000 UT) tablet Take 2,000 Units by mouth daily.   clobetasol cream (TEMOVATE) 0.05 % APPLY TOPICALLY TO THE AFFECTED AREA TWICE DAILY. STOP WHEN SMOOTH. AVOID FACE AND BODY FOLDS   ferrous sulfate 325 (65 FE) MG EC tablet Take 325 mg by mouth every Monday.   finasteride (PROSCAR) 5 MG tablet Take 5 mg by mouth daily.   Melatonin 5 MG TABS Take 5 mg by mouth at bedtime as needed (sleep).   Menaquinone-7 (VITAMIN K2) 100 MCG CAPS Take 100 mcg by mouth daily.   Multiple Vitamin (MULTIVITAMIN) tablet Take 1 tablet by mouth daily.   omeprazole (PRILOSEC) 20 MG capsule Take 20 mg by mouth 2 (two) times daily before a meal.   Potassium 99 MG TABS Take 99 mg by mouth daily.   Povidone, PF, (IVIZIA DRY EYES) 0.5 % SOLN Place 1 drop into both eyes at bedtime.   SUMAtriptan (IMITREX) 50 MG tablet Take 50 mg by mouth every  2 (two) hours as needed for migraine.   traMADol (ULTRAM) 50 MG tablet Take 50 mg by mouth 4 (four) times daily as needed.   triamcinolone ointment (KENALOG) 0.1 % Apply to left nare daily as needed.   valsartan (DIOVAN) 320 MG tablet Take 1 tablet (320 mg total) by mouth daily.     Allergies:   Caine-1 [lidocaine], Indomethacin, Other, Articaine-epinephrine, Gabapentin, Hydrocodone, Latex, and Lamisil [terbinafine]   Past Medical History:  Diagnosis Date   Aneurysm of renal artery (HCC)    followed at [facility], stable with CT/angiogram 10/2021   Fracture of right wrist 2011   Fell in bathroom.   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    Past Surgical History:  Procedure Laterality Date   ABDOMINOPLASTY  2010    APPENDECTOMY  1962   BREAST EXCISIONAL BIOPSY Bilateral 2001   x 3   BRONCHIAL WASHINGS  09/16/2022   Procedure: BRONCHIAL WASHINGS;  Surgeon: Kalman Shan, MD;  Location: [facility] ENDOSCOPY;  Service: Endoscopy;;   CATARACT EXTRACTION, BILATERAL Bilateral 01/2020   Dental implants  1993   HEMORRHOID SURGERY  2000   JOINT REPLACEMENT     KNEE ARTHROSCOPY W/ DEBRIDEMENT Right 07/2020   Dr. Luiz Blare   TOTAL KNEE ARTHROPLASTY Right 01/21/2022   Procedure: RIGHT TOTAL KNEE ARTHROPLASTY;  Surgeon: Kathryne Hitch, MD;  Location: [facility] ORS;  Service: Orthopedics;  Laterality: Right;  RFNA   VAGINAL HYSTERECTOMY  1974   Dysfunctional uterine bleeding.   VIDEO BRONCHOSCOPY N/A 09/16/2022   Procedure: VIDEO BRONCHOSCOPY WITHOUT FLUORO;  Surgeon: Kalman Shan, MD;  Location: [facility] ENDOSCOPY;  Service: Endoscopy;  Laterality: N/A;     Social History:  The patient  reports that she quit smoking about 40 years ago. Her smoking use included cigarettes. She started smoking about 50 years ago. She has a 30 pack-year smoking history. She has never used smokeless tobacco. She reports current alcohol use of about 7.0 standard drinks of alcohol per week. She reports that she does not use drugs.   Family History:  The patient's family history includes Autoimmune disease in her sister; Cancer in her sister; Hypertension in her mother; Lupus in her brother.    ROS:  Please see the history of present illness. All other systems are reviewed and  Negative to the above problem except as noted.    PHYSICAL EXAM: VS:  BP 130/70   Pulse 68   Ht 5' 2.75" (1.594 m)   Wt 111 lb 3.2 oz (50.4 kg)   LMP 12/14/1972   SpO2 97%   BMI 19.86 kg/m   QMV:HQIO 77 yo in NAD  HEENT: normal  Neck: no JVD, Cardiac: RRR; no murmurs,   No LE edema   Respiratory:  clear to auscultation GI: soft, nontender, No hepatomegaly  MS: no deformity Moving all extremities    EKG:  EKG not done    Echo  2024  1. Left  ventricular ejection fraction, by estimation, is 55 to 60%. The  left ventricle has normal function. The left ventricle has no regional  wall motion abnormalities. Left ventricular diastolic parameters are  consistent with Grade I diastolic  dysfunction (impaired relaxation).   2. Right ventricular systolic function is normal. The right ventricular  size is normal. There is normal pulmonary artery systolic pressure. The  estimated right ventricular systolic pressure is 24.5 mmHg.   3. The mitral valve is grossly normal. Mild mitral valve regurgitation.  No evidence of mitral stenosis.   4. Tricuspid valve regurgitation is mild to moderate.   5. The aortic valve is tricuspid. There is mild calcification of the  aortic valve. Aortic valve regurgitation is mild. No aortic stenosis is  present. Aortic regurgitation PHT measures 501 msec.   6. The inferior vena cava is normal in size with greater than 50%  respiratory variability, suggesting right atrial pressure of 3 mmHg.   CTA Une 2024   IMPRESSION: 1. Minimal CAD, <25% stenosis, CADRADS 1.   2. Total plaque volume 72 mm3 which is 16th percentile for age- and sex-matched controls (calcified plaque 5 mm3; non-calcified plaque 67 mm3). TPV is mild.   3. Coronary calcium score is 49.3, which places the patient in the 42nd percentile for age and sex matched control.   4. Normal coronary origins with right dominance.   RECOMMENDATIONS: CAD-RADS 1. Minimal non-obstructive CAD (0-24%). Consider non-atherosclerotic causes of chest pain. Consider preventive therapy and risk factor modification. Lipid Panel No results found for: "CHOL", "TRIG", "HDL", "CHOLHDL", "VLDL", "LDLCALC", "LDLDIRECT"    Wt Readings from Last 3 Encounters:  05/24/23 111 lb 3.2 oz (50.4 kg)  05/02/23 110 lb (49.9 kg)  04/10/23 111 lb 3.2 oz (50.4 kg)      ASSESSMENT AND PLAN:  1 CAD   Mild on CCTA   No symptoms of angina   2  Hx  LBBB  No dizziness   Will  continue to follow for symptoms   Told pt to call if dizzy, SOB  3  HTN  BP is improved on current regimen   I have asked her to follow periodically  for bumps up or down   4  Pulmonary   Pt followed for TB dx   Just finished meds a few wks ago  Breathing is good     4  Liver   Will get LFTs tomorrow      Elevated during Rx earlier this year   6  HL  Will get lipomed tomorrow     Follow up in 9 months   Sooner for problems   Current medicines are reviewed at length with the patient today.  The patient does not have concerns regarding medicines.  Signed, Dietrich Pates, MD  05/24/2023 8:31 AM    [facility] [address] [place], [place], [place]  [address] Phone: [phone]; Fax: [phone]

## 2023-05-24 NOTE — Patient Instructions (Signed)
Medication Instructions:  Your physician recommends that you continue on your current medications as directed. Please refer to the Current Medication list given to you today.  *If you need a refill on your cardiac medications before your next appointment, please call your pharmacy*   Lab Work: Lipomed at PCP  Follow-Up: At [facility], you and your health needs are our priority.  As part of our continuing mission to provide you with exceptional heart care, we have created designated Provider Care Teams.  These Care Teams include your primary Cardiologist (physician) and Advanced Practice Providers (APPs -  Physician Assistants and Nurse Practitioners) who all work together to provide you with the care you need, when you need it.   Your next appointment:   9 months  Provider:   Dietrich Pates, MD

## 2023-05-25 DIAGNOSIS — M5430 Sciatica, unspecified side: Secondary | ICD-10-CM | POA: Diagnosis not present

## 2023-05-25 DIAGNOSIS — Z1331 Encounter for screening for depression: Secondary | ICD-10-CM | POA: Diagnosis not present

## 2023-05-25 DIAGNOSIS — I10 Essential (primary) hypertension: Secondary | ICD-10-CM | POA: Diagnosis not present

## 2023-05-25 DIAGNOSIS — I2510 Atherosclerotic heart disease of native coronary artery without angina pectoris: Secondary | ICD-10-CM | POA: Diagnosis not present

## 2023-05-25 DIAGNOSIS — Z8639 Personal history of other endocrine, nutritional and metabolic disease: Secondary | ICD-10-CM | POA: Diagnosis not present

## 2023-05-25 DIAGNOSIS — M810 Age-related osteoporosis without current pathological fracture: Secondary | ICD-10-CM | POA: Diagnosis not present

## 2023-05-25 DIAGNOSIS — Z79899 Other long term (current) drug therapy: Secondary | ICD-10-CM | POA: Diagnosis not present

## 2023-05-25 DIAGNOSIS — I700 Atherosclerosis of aorta: Secondary | ICD-10-CM | POA: Diagnosis not present

## 2023-05-25 DIAGNOSIS — R748 Abnormal levels of other serum enzymes: Secondary | ICD-10-CM | POA: Diagnosis not present

## 2023-05-25 DIAGNOSIS — E7800 Pure hypercholesterolemia, unspecified: Secondary | ICD-10-CM | POA: Diagnosis not present

## 2023-05-25 DIAGNOSIS — Z0000 Encounter for general adult medical examination without abnormal findings: Secondary | ICD-10-CM | POA: Diagnosis not present

## 2023-05-26 DIAGNOSIS — Z23 Encounter for immunization: Secondary | ICD-10-CM | POA: Diagnosis not present

## 2023-05-27 ENCOUNTER — Encounter: Payer: Self-pay | Admitting: Internal Medicine

## 2023-05-31 DIAGNOSIS — M47816 Spondylosis without myelopathy or radiculopathy, lumbar region: Secondary | ICD-10-CM | POA: Diagnosis not present

## 2023-05-31 DIAGNOSIS — M25552 Pain in left hip: Secondary | ICD-10-CM | POA: Diagnosis not present

## 2023-05-31 DIAGNOSIS — F4321 Adjustment disorder with depressed mood: Secondary | ICD-10-CM | POA: Diagnosis not present

## 2023-05-31 DIAGNOSIS — G894 Chronic pain syndrome: Secondary | ICD-10-CM | POA: Diagnosis not present

## 2023-06-01 ENCOUNTER — Other Ambulatory Visit: Payer: Self-pay

## 2023-06-01 MED ORDER — ATORVASTATIN CALCIUM 10 MG PO TABS: 5.0000 mg | ORAL_TABLET | Freq: Every day | ORAL | 3 refills | Status: DC

## 2023-06-05 DIAGNOSIS — G43719 Chronic migraine without aura, intractable, without status migrainosus: Secondary | ICD-10-CM | POA: Diagnosis not present

## 2023-06-14 DIAGNOSIS — K52832 Lymphocytic colitis: Secondary | ICD-10-CM | POA: Diagnosis not present

## 2023-06-14 DIAGNOSIS — A310 Pulmonary mycobacterial infection: Secondary | ICD-10-CM | POA: Diagnosis not present

## 2023-06-14 DIAGNOSIS — R197 Diarrhea, unspecified: Secondary | ICD-10-CM | POA: Diagnosis not present

## 2023-06-14 DIAGNOSIS — K649 Unspecified hemorrhoids: Secondary | ICD-10-CM | POA: Diagnosis not present

## 2023-06-14 DIAGNOSIS — Z8719 Personal history of other diseases of the digestive system: Secondary | ICD-10-CM | POA: Diagnosis not present

## 2023-06-19 DIAGNOSIS — K52832 Lymphocytic colitis: Secondary | ICD-10-CM | POA: Diagnosis not present

## 2023-06-19 DIAGNOSIS — J341 Cyst and mucocele of nose and nasal sinus: Secondary | ICD-10-CM | POA: Diagnosis not present

## 2023-06-19 DIAGNOSIS — A310 Pulmonary mycobacterial infection: Secondary | ICD-10-CM | POA: Diagnosis not present

## 2023-06-19 DIAGNOSIS — Z8719 Personal history of other diseases of the digestive system: Secondary | ICD-10-CM | POA: Diagnosis not present

## 2023-06-19 DIAGNOSIS — L905 Scar conditions and fibrosis of skin: Secondary | ICD-10-CM | POA: Diagnosis not present

## 2023-06-19 DIAGNOSIS — R197 Diarrhea, unspecified: Secondary | ICD-10-CM | POA: Diagnosis not present

## 2023-06-19 DIAGNOSIS — Z1211 Encounter for screening for malignant neoplasm of colon: Secondary | ICD-10-CM | POA: Diagnosis not present

## 2023-06-19 DIAGNOSIS — Z8711 Personal history of peptic ulcer disease: Secondary | ICD-10-CM | POA: Diagnosis not present

## 2023-06-22 ENCOUNTER — Other Ambulatory Visit: Payer: Self-pay | Admitting: Medical Genetics

## 2023-06-22 DIAGNOSIS — Z006 Encounter for examination for normal comparison and control in clinical research program: Secondary | ICD-10-CM

## 2023-06-22 DIAGNOSIS — M8000XA Age-related osteoporosis with current pathological fracture, unspecified site, initial encounter for fracture: Secondary | ICD-10-CM | POA: Diagnosis not present

## 2023-06-22 DIAGNOSIS — E871 Hypo-osmolality and hyponatremia: Secondary | ICD-10-CM | POA: Diagnosis not present

## 2023-06-22 DIAGNOSIS — Z8781 Personal history of (healed) traumatic fracture: Secondary | ICD-10-CM | POA: Diagnosis not present

## 2023-06-22 DIAGNOSIS — E042 Nontoxic multinodular goiter: Secondary | ICD-10-CM | POA: Diagnosis not present

## 2023-07-01 ENCOUNTER — Encounter (HOSPITAL_BASED_OUTPATIENT_CLINIC_OR_DEPARTMENT_OTHER): Payer: Self-pay | Admitting: Obstetrics & Gynecology

## 2023-07-01 ENCOUNTER — Encounter: Payer: Self-pay | Admitting: Internal Medicine

## 2023-07-03 ENCOUNTER — Other Ambulatory Visit (HOSPITAL_BASED_OUTPATIENT_CLINIC_OR_DEPARTMENT_OTHER): Payer: Self-pay | Admitting: *Deleted

## 2023-07-03 DIAGNOSIS — F32A Depression, unspecified: Secondary | ICD-10-CM

## 2023-07-03 DIAGNOSIS — Z48817 Encounter for surgical aftercare following surgery on the skin and subcutaneous tissue: Secondary | ICD-10-CM | POA: Diagnosis not present

## 2023-07-03 MED ORDER — BUPROPION HCL ER (XL) 150 MG PO TB24: 150.0000 mg | ORAL_TABLET | Freq: Every day | ORAL | 1 refills | Status: DC

## 2023-07-03 NOTE — Telephone Encounter (Signed)
Called pt in response to MyChart message. Confirmed pharmacy of [facility], not [facility] as she sent in message. Refill sent

## 2023-07-06 DIAGNOSIS — R197 Diarrhea, unspecified: Secondary | ICD-10-CM | POA: Diagnosis not present

## 2023-07-11 DIAGNOSIS — A0472 Enterocolitis due to Clostridium difficile, not specified as recurrent: Secondary | ICD-10-CM | POA: Diagnosis not present

## 2023-07-11 DIAGNOSIS — F32A Depression, unspecified: Secondary | ICD-10-CM | POA: Diagnosis not present

## 2023-07-11 DIAGNOSIS — G8929 Other chronic pain: Secondary | ICD-10-CM | POA: Diagnosis not present

## 2023-07-12 ENCOUNTER — Other Ambulatory Visit: Payer: Self-pay

## 2023-07-12 ENCOUNTER — Ambulatory Visit: Payer: Medicare Other | Attending: Internal Medicine

## 2023-07-12 DIAGNOSIS — M6281 Muscle weakness (generalized): Secondary | ICD-10-CM | POA: Diagnosis not present

## 2023-07-20 ENCOUNTER — Ambulatory Visit: Payer: Medicare Other | Attending: Internal Medicine

## 2023-07-20 DIAGNOSIS — M6281 Muscle weakness (generalized): Secondary | ICD-10-CM | POA: Insufficient documentation

## 2023-07-20 NOTE — Therapy (Signed)
OUTPATIENT PHYSICAL THERAPY NOTE   Patient Name: Elaine Navarro MRN: [id number] DOB:1946/07/31, 77 y.o., female Today's Date: 07/20/2023  END OF SESSION:  PT End of Session - 07/20/23 1545     Visit Number 2    Number of Visits 5    Date for PT Re-Evaluation 08/09/23    Authorization Type MCR/BCBS    PT Start Time 1530    PT Stop Time 1610    PT Time Calculation (min) 40 min    Behavior During Therapy [facility] for tasks assessed/performed             Past Medical History:  Diagnosis Date   Aneurysm of renal artery (HCC)    followed at [facility], stable with CT/angiogram 10/2021   Fracture of right wrist 2011   Fell in bathroom.   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   Past Surgical History:  Procedure Laterality Date   ABDOMINOPLASTY  2010   APPENDECTOMY  1962   BREAST EXCISIONAL BIOPSY Bilateral 2001   x 3   BRONCHIAL WASHINGS  09/16/2022   Procedure: BRONCHIAL WASHINGS;  Surgeon: Kalman Shan, MD;  Location: [facility] ENDOSCOPY;  Service: Endoscopy;;   CATARACT EXTRACTION, BILATERAL Bilateral 01/2020   Dental implants  1993   HEMORRHOID SURGERY  2000   JOINT REPLACEMENT     KNEE ARTHROSCOPY W/ DEBRIDEMENT Right 07/2020   Dr. Luiz Blare   TOTAL KNEE ARTHROPLASTY Right 01/21/2022   Procedure: RIGHT TOTAL KNEE ARTHROPLASTY;  Surgeon: Kathryne Hitch, MD;  Location: [facility] ORS;  Service: Orthopedics;  Laterality: Right;  RFNA   VAGINAL HYSTERECTOMY  1974   Dysfunctional uterine bleeding.   VIDEO BRONCHOSCOPY N/A 09/16/2022   Procedure: VIDEO BRONCHOSCOPY WITHOUT FLUORO;  Surgeon: Kalman Shan, MD;  Location: [facility] ENDOSCOPY;  Service: Endoscopy;  Laterality: N/A;   Patient Active Problem List   Diagnosis  Date Noted   Chronic cough 09/16/2022   Right lower lobe pulmonary infiltrate 09/16/2022   Abnormal CT of the chest 06/30/2022   Status post right knee replacement 01/21/2022   Arthritis of right knee 01/20/2022   Lung nodules 12/28/2021   Primary hypertension 05/13/2021   Thyroid nodule 05/13/2021   History of depression 05/13/2021   H/O: hysterectomy 05/13/2021   Postmenopausal 05/13/2021   Piriformis syndrome of left side 04/07/2021   Chronic pain of right knee 03/04/2021   H/O sciatica 11/04/2020   Age-related osteoporosis without current pathological fracture 11/04/2020   Vitamin D insufficiency 12/20/2019   Actinic keratosis 03/21/2017   Focal lymphocytic colitis 05/29/2013   Aneurysm artery, renal (HCC) 09/28/2012   Headache, chronic migraine without aura, intractable 03/02/2012    PCP: Thana Ates, MD  REFERRING PROVIDER: Talmage Coin, MD   REFERRING DIAG:  Age-related osteoporosis with current pathological fracture, unspecified site, initial encounter for fracture [M80.00XA]   THERAPY DIAG:  Muscle weakness (generalized)  Rationale for Evaluation and Treatment: Rehabilitation  ONSET DATE: 06/23/23 (date of referral)   SUBJECTIVE:   SUBJECTIVE STATEMENT:  07/20/2023 Patient reports to PT with no worsening of symptoms since last visit.  She will buy to receive written exercises to work on at home.  Eval: Patient  reports to PT d/t osteoporosis of L>R LE to address LE strength, balance. She reports a history of L LE sciatic nerve pain which has been improving and Rt TKA which is doing very well. She denies presence of pain and states that she was referred to PT because of bone loss. However, she continues to live a very active life including 2 hours of competitive ballroom dancing daily, walking, golfing, and some weight lifting at the gym. She did have a previous wrist fracture, which she is having no issues with at this time.   PERTINENT HISTORY: PMHx includes:  Aneurysm of renal artery, Rt wrist fracture d/t fall (2011), HTN, lymphocytic colitis, migraines, osteoporosis, Rt TKA (2023), Lt Piriformis syndrome and h/o sciatica, Vitamin D insufficiency    PAIN:  Are you having pain? No  PRECAUTIONS: Other: Osteoporosis   RED FLAGS: None   WEIGHT BEARING RESTRICTIONS: No  FALLS:  Has patient fallen in last 6 months? 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  DIAGNOSTIC FINDINGS:   04/21/23 Bone Density Scan  ASSESSMENT: The BMD measured at Femur Neck Left is 0.664 g/cm2 with a T-score of -2.7. This patient is considered osteoporotic according to World Health Organization [facility]) criteria.   The quality of the exam is good. L3 and L4 were excluded due to degenerative changes as noted on previous exam.  PATIENT SURVEYS:  FOTO 97  COGNITION: Overall cognitive status: Within functional limits for tasks assessed     SENSATION: Not tested   POSTURE: forward head and increased thoracic kyphosis  LOWER EXTREMITY ROM: Grossly WFL Bilaterally    LOWER EXTREMITY MMT:  MMT Right eval Left eval  Hip flexion 4+ 4+  Hip extension 4 4-  Hip abduction 4+ 4+  Hip adduction 5 5  Hip internal rotation 4- 5  Hip external rotation 5 4+  Knee flexion 5 4+  Knee extension 5 4+  Ankle dorsiflexion 5 4+  Ankle plantarflexion 4+ 4+  Ankle inversion 4+ 4+  Ankle eversion 4+ 4+     FUNCTIONAL TESTS:   Static Balance    Romberg EO on floor  30 sec  Romberg EC on floor 30 sec  Romberg EO on  airex 30 sec  Romberg EC on airex     30 sec    Static Balance Right Left   Tandem on airex 30 sec 30 sec  SLS on floor  30 sec 30 sec     GAIT: Distance walked: 100 ft Assistive device utilized: None Level of assistance: Complete Independence Comments: Mildly forward flexed posture, with increased thoracic kyphosis  Functional Gait Assessment: 22/30 as of 07/20/23   TODAY'S TREATMENT:             AVOID spinal flexion/twisting  [facility] Adult PT Treatment:                                                DATE: 07/20/2023  Therapeutic Exercise: 3  x 8   Prone hip extension with 3# ankle weight, 3 x 10 Prone Knee Bend, 2 x 8 BIL Prone on Elbow, 3 x 30 sec  "Swimmers", alternating upper extremities/lower extremities from prone 2 x 5 each limb Created, and reviewed HEP  Therapeutic Activity: Balance activity at parallel bar Airex beam, hurdles x 2, Airex pad between hurdles, x 3 laps Dynamic gait assessment as noted above and reviewed indications of results                                                                                                                  [facility] Adult PT Treatment:                                                DATE: 07/12/2023   Initial evaluation: see patient education and home exercise program as noted below    PATIENT EDUCATION:  Education details: reviewed initial home exercise program; discussion of POC, prognosis and goals for skilled PT   Person educated: Patient Education method: Explanation, Demonstration, and Handouts Education comprehension: verbalized understanding, returned demonstration, and needs further education  HOME EXERCISE PROGRAM: Access Code: [email or web address] URL: [email or web address] Date: 07/20/2023 Prepared by: Mauri Reading  Exercises - Prone Alternating Arm and Leg Lifts  - 1 x daily - 3 x weekly - 2 sets - 5 reps - Prone Knee Flexion  - 1 x daily - 3 x weekly - 2 sets - 10 reps - Prone Press Up On Elbows   - 1 x daily - 3 x weekly - 3 sets - 30 sec hold  ASSESSMENT:  CLINICAL IMPRESSION:  07/20/2023: Patient had good tolerance of exercises today, especially lumbar/lower extremity strengthening activities. Performed assessment of gait today, which indicates some risk of falls with most difficulty performing gait with eyes closed, ambulating backwards, reactionary pivoting, and ambulating with narrow base of support.  Plan is to continue with addressing extension exercises, strengthening exercises as indicated, and inclusion of reactionary/dynamic balance training.  Eval: Dariya is a 77 y.o. female   who was seen today for physical therapy evaluation and treatment for LE weakness and balance assessment d/t presence of osteoporosis. She is demonstrating kyphotic posture and BIL LE weakness. She has good static balance, but will benefit from further assessment/treatment of dynamic balance at her next PT visit. She requires skilled PT services at this time to address relevant deficits and improve overall function.     OBJECTIVE IMPAIRMENTS: decreased strength and postural dysfunction.   ACTIVITY LIMITATIONS: lifting  PARTICIPATION LIMITATIONS: community activity  PERSONAL FACTORS: Age and 3+ comorbidities: PMHx includes: Aneurysm of renal artery, Rt wrist fracture d/t fall (2011), HTN, lymphocytic colitis, migraines, osteoporosis, Rt TKA (2023), Lt Piriformis syndrome and h/o sciatica, Vitamin D insufficiency    are also affecting patient's functional outcome.   REHAB POTENTIAL: Good  CLINICAL DECISION MAKING: Stable/uncomplicated  EVALUATION  COMPLEXITY: Low   GOALS: Goals reviewed with patient? Yes  SHORT TERM GOALS: = LONG TERM GOALS: Target date: 08/09/2023   Patient will demonstrate improved LE strength to at least 4+/5 MMT in BIL LE.  Baseline:  MMT Right eval Left eval  Hip flexion 4+ 4+  Hip extension 4 4-  Hip abduction 4+ 4+  Hip adduction 5 5  Hip internal rotation 4- 5   Hip external rotation 5 4+  Knee flexion 5 4+  Knee extension 5 4+  Ankle dorsiflexion 5 4+  Ankle plantarflexion 4+ 4+  Ankle inversion 4+ 4+  Ankle eversion 4+ 4+   Goal status: INITIAL  2. Patient will be independent with initial home program with focus on LE strengthening and TS/LS extensor mm.  Baseline: to be provided at f/u visit Goal status: INITIAL  3.  Patient will demonstrate improved thoracolumbar extension while standing and seated.  Baseline: see objective measures Goal status: INITIAL  4. Patient will score 25 or greater on Functional Gait Assessment in order to indicate low risk of falls.  Baseline: 22   Goal Status: INITIAL, as of 07/20/23     PLAN:  PT FREQUENCY: 1x/week  PT DURATION: 5 weeks  PLANNED INTERVENTIONS: 16109- PT Re-evaluation, 97110-Therapeutic exercises, 97530- Therapeutic activity, 97112- Neuromuscular re-education, 97535- Self Care, 60454- Manual therapy, [phone]- Aquatic Therapy, and Patient/Family education  PLAN FOR NEXT SESSION:  BW impact loading exercises, strength training (2-3 sets of 8) with focus on thoracic/lumbar extensors and BIL LE, dynamic and reactive balance activities; AVOID spinal flexion/twisting; address balance with eyes closed, backwards walking, pivoting/turning, reactionary balance   Mauri Reading, PT, DPT  07/20/2023, 7:09 PM

## 2023-07-24 ENCOUNTER — Ambulatory Visit (HOSPITAL_BASED_OUTPATIENT_CLINIC_OR_DEPARTMENT_OTHER)
Admission: RE | Admit: 2023-07-24 | Discharge: 2023-07-24 | Disposition: A | Discharge status: Home / Self Care | Payer: Medicare Other | Source: Ambulatory Visit | Attending: Internal Medicine | Admitting: Internal Medicine

## 2023-07-24 DIAGNOSIS — J929 Pleural plaque without asbestos: Secondary | ICD-10-CM | POA: Diagnosis not present

## 2023-07-24 DIAGNOSIS — A159 Respiratory tuberculosis unspecified: Secondary | ICD-10-CM | POA: Insufficient documentation

## 2023-07-24 DIAGNOSIS — R590 Localized enlarged lymph nodes: Secondary | ICD-10-CM | POA: Diagnosis not present

## 2023-07-24 DIAGNOSIS — J479 Bronchiectasis, uncomplicated: Secondary | ICD-10-CM | POA: Diagnosis not present

## 2023-07-24 DIAGNOSIS — R918 Other nonspecific abnormal finding of lung field: Secondary | ICD-10-CM | POA: Diagnosis not present

## 2023-07-26 ENCOUNTER — Other Ambulatory Visit (HOSPITAL_COMMUNITY)
Admission: RE | Admit: 2023-07-26 | Discharge: 2023-07-26 | Disposition: A | Discharge status: Home / Self Care | Payer: Medicare Other | Source: Ambulatory Visit | Attending: Oncology | Admitting: Oncology

## 2023-07-26 DIAGNOSIS — Z006 Encounter for examination for normal comparison and control in clinical research program: Secondary | ICD-10-CM

## 2023-07-27 ENCOUNTER — Ambulatory Visit: Payer: Medicare Other

## 2023-07-27 DIAGNOSIS — M6281 Muscle weakness (generalized): Secondary | ICD-10-CM

## 2023-07-27 NOTE — Therapy (Signed)
OUTPATIENT PHYSICAL THERAPY NOTE   Patient Name: Elaine Navarro MRN: [id number] DOB:12-Sep-1946, 77 y.o., female Today's Date: 07/27/2023  END OF SESSION:  PT End of Session - 07/27/23 0912     Visit Number 3    Number of Visits 5    Date for PT Re-Evaluation 08/09/23    Authorization Type MCR/BCBS    Progress Note Due on Visit 5    PT Start Time 0915    PT Stop Time 0955    PT Time Calculation (min) 40 min    Activity Tolerance Patient tolerated treatment well    Behavior During Therapy [facility] for tasks assessed/performed              Past Medical History:  Diagnosis Date   Aneurysm of renal artery (HCC)    followed at [facility], stable with CT/angiogram 10/2021   Fracture of right wrist 2011   Fell in bathroom.   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   Past Surgical History:  Procedure Laterality Date   ABDOMINOPLASTY  2010   APPENDECTOMY  1962   BREAST EXCISIONAL BIOPSY Bilateral 2001   x 3   BRONCHIAL WASHINGS  09/16/2022   Procedure: BRONCHIAL WASHINGS;  Surgeon: Kalman Shan, MD;  Location: [facility] ENDOSCOPY;  Service: Endoscopy;;   CATARACT EXTRACTION, BILATERAL Bilateral 01/2020   Dental implants  1993   HEMORRHOID SURGERY  2000   JOINT REPLACEMENT     KNEE ARTHROSCOPY W/ DEBRIDEMENT Right 07/2020   Dr. Luiz Blare   TOTAL KNEE ARTHROPLASTY Right 01/21/2022   Procedure: RIGHT TOTAL KNEE ARTHROPLASTY;  Surgeon: Kathryne Hitch, MD;  Location: [facility] ORS;  Service: Orthopedics;  Laterality: Right;  RFNA   VAGINAL HYSTERECTOMY  1974   Dysfunctional uterine bleeding.   VIDEO BRONCHOSCOPY N/A 09/16/2022   Procedure: VIDEO BRONCHOSCOPY WITHOUT FLUORO;  Surgeon: Kalman Shan, MD;  Location: [facility]  ENDOSCOPY;  Service: Endoscopy;  Laterality: N/A;   Patient Active Problem List   Diagnosis Date Noted   Chronic cough 09/16/2022   Right lower lobe pulmonary infiltrate 09/16/2022   Abnormal CT of the chest 06/30/2022   Status post right knee replacement 01/21/2022   Arthritis of right knee 01/20/2022   Lung nodules 12/28/2021   Primary hypertension 05/13/2021   Thyroid nodule 05/13/2021   History of depression 05/13/2021   H/O: hysterectomy 05/13/2021   Postmenopausal 05/13/2021   Piriformis syndrome of left side 04/07/2021   Chronic pain of right knee 03/04/2021   H/O sciatica 11/04/2020   Age-related osteoporosis without current pathological fracture 11/04/2020   Vitamin D insufficiency 12/20/2019   Actinic keratosis 03/21/2017   Focal lymphocytic colitis 05/29/2013   Aneurysm artery, renal (HCC) 09/28/2012   Headache, chronic migraine without aura, intractable 03/02/2012    PCP: Thana Ates, MD  REFERRING PROVIDER: Talmage Coin, MD   REFERRING DIAG:  Age-related osteoporosis with current pathological fracture, unspecified site, initial encounter for fracture [M80.00XA]   THERAPY DIAG:  Muscle weakness (generalized)  Rationale for Evaluation and Treatment: Rehabilitation  ONSET DATE: 06/23/23 (date of referral)   SUBJECTIVE:   SUBJECTIVE STATEMENT: Patient reports that she felt good after the last session and  that it was "eye opening." She has been compliant with her HEP.   Eval: Patient reports to PT d/t osteoporosis of L>R LE to address LE strength, balance. She reports a history of L LE sciatic nerve pain which has been improving and Rt TKA which is doing very well. She denies presence of pain and states that she was referred to PT because of bone loss. However, she continues to live a very active life including 2 hours of competitive ballroom dancing daily, walking, golfing, and some weight lifting at the gym. She did have a previous wrist fracture, which she is  having no issues with at this time.   PERTINENT HISTORY: PMHx includes: Aneurysm of renal artery, Rt wrist fracture d/t fall (2011), HTN, lymphocytic colitis, migraines, osteoporosis, Rt TKA (2023), Lt Piriformis syndrome and h/o sciatica, Vitamin D insufficiency    PAIN:  Are you having pain? No  PRECAUTIONS: Other: Osteoporosis   RED FLAGS: None   WEIGHT BEARING RESTRICTIONS: No  FALLS:  Has patient fallen in last 6 months? 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  DIAGNOSTIC FINDINGS:   04/21/23 Bone Density Scan  ASSESSMENT: The BMD measured at Femur Neck Left is 0.664 g/cm2 with a T-score of -2.7. This patient is considered osteoporotic according to World Health Organization [facility]) criteria.   The quality of the exam is good. L3 and L4 were excluded due to degenerative changes as noted on previous exam.  PATIENT SURVEYS:  FOTO 97  COGNITION: Overall cognitive status: Within functional limits for tasks assessed     SENSATION: Not tested   POSTURE: forward head and increased thoracic kyphosis  LOWER EXTREMITY ROM: Grossly WFL Bilaterally    LOWER EXTREMITY MMT:  MMT Right eval Left eval  Hip flexion 4+ 4+  Hip extension 4 4-  Hip abduction 4+ 4+  Hip adduction 5 5  Hip internal rotation 4- 5  Hip external rotation 5 4+  Knee flexion 5 4+  Knee extension 5 4+  Ankle dorsiflexion 5 4+  Ankle plantarflexion 4+ 4+  Ankle inversion 4+ 4+  Ankle eversion 4+ 4+     FUNCTIONAL TESTS:   Static Balance     Romberg EO on floor  30 sec  Romberg EC on floor 30 sec  Romberg EO on airex 30 sec  Romberg EC on airex     30 sec    Static Balance Right Left   Tandem on airex 30 sec 30 sec  SLS on floor  30 sec 30 sec     GAIT: Distance walked: 100 ft Assistive device utilized: None Level of assistance: Complete Independence Comments: Mildly forward flexed posture, with increased thoracic kyphosis  Functional Gait Assessment: 22/30 as of 07/20/23   TODAY'S TREATMENT:             AVOID spinal flexion/twisting  [facility] Adult PT Treatment:  DATE: 07/27/23 Therapeutic Exercise: In // bars: Standing hip abduction/extension 2x10 ea BIL Standing "swimmers" alternating UE/LE x10 BIL Neuromuscular re-ed: Balance activity at parallel bar Airex beam, hurdles x 2, Airex pad between hurdles, x 3 laps Airex beam fwd/bwd tandem walking Backwards walking with reactionary balance training from clinician resistance SLS with circles on 5000g ball x30" CW/CCW BIL Tandem stance on Airex EO x30" BIL FT on Airex EC x30"  FT on Airex with perturbation    [facility] Adult PT Treatment:                                                DATE: 07/20/2023  Therapeutic Exercise: 3 x 8   Prone hip extension with 3# ankle weight, 3 x 10 Prone Knee Bend, 2 x 8 BIL Prone on Elbow, 3 x 30 sec  "Swimmers", alternating upper extremities/lower extremities from prone 2 x 5 each limb Created, and reviewed HEP  Therapeutic Activity: Balance activity at parallel bar Airex beam, hurdles x 2, Airex pad between hurdles, x 3 laps Dynamic gait assessment as noted above and reviewed indications of results                                                                                                                  [facility] Adult PT Treatment:                                                DATE: 07/12/2023   Initial evaluation: see patient education and home exercise program as noted  below    PATIENT EDUCATION:  Education details: reviewed initial home exercise program; discussion of POC, prognosis and goals for skilled PT   Person educated: Patient Education method: Explanation, Demonstration, and Handouts Education comprehension: verbalized understanding, returned demonstration, and needs further education  HOME EXERCISE PROGRAM: Access Code: [email or web address] URL: [email or web address] Date: 07/20/2023 Prepared by: Mauri Reading  Exercises - Prone Alternating Arm and Leg Lifts  - 1 x daily - 3 x weekly - 2 sets - 5 reps - Prone Knee Flexion  - 1 x daily - 3 x weekly - 2 sets - 10 reps - Prone Press Up On Elbows  - 1 x daily - 3 x weekly - 3 sets - 30 sec hold  ASSESSMENT:  CLINICAL IMPRESSION: Patient presents to PT reporting HEP compliance and that she has enjoyed the balance activities. Session today focused on static, dynamic, and reactionary balance tasks. Most difficult task was tandem stance on Airex, increased unsteadiness but no overt LOB. Patient was able to tolerate all prescribed exercises with no adverse effects. Patient continues to benefit from skilled PT services and should be progressed as able to improve functional independence.      OBJECTIVE IMPAIRMENTS:  decreased strength and postural dysfunction.   ACTIVITY LIMITATIONS: lifting  PARTICIPATION LIMITATIONS: community activity  PERSONAL FACTORS: Age and 3+ comorbidities: PMHx includes: Aneurysm of renal artery, Rt wrist fracture d/t fall (2011), HTN, lymphocytic colitis, migraines, osteoporosis, Rt TKA (2023), Lt Piriformis syndrome and h/o sciatica, Vitamin D insufficiency    are also affecting patient's functional outcome.   REHAB POTENTIAL: Good  CLINICAL DECISION MAKING: Stable/uncomplicated  EVALUATION COMPLEXITY: Low   GOALS: Goals reviewed with patient? Yes  SHORT TERM GOALS: = LONG TERM GOALS: Target date: 08/09/2023   Patient will demonstrate improved LE strength  to at least 4+/5 MMT in BIL LE.  Baseline:  MMT Right eval Left eval  Hip flexion 4+ 4+  Hip extension 4 4-  Hip abduction 4+ 4+  Hip adduction 5 5  Hip internal rotation 4- 5  Hip external rotation 5 4+  Knee flexion 5 4+  Knee extension 5 4+  Ankle dorsiflexion 5 4+  Ankle plantarflexion 4+ 4+  Ankle inversion 4+ 4+  Ankle eversion 4+ 4+   Goal status: INITIAL  2. Patient will be independent with initial home program with focus on LE strengthening and TS/LS extensor mm.  Baseline: to be provided at f/u visit Goal status: INITIAL  3.  Patient will demonstrate improved thoracolumbar extension while standing and seated.  Baseline: see objective measures Goal status: INITIAL  4. Patient will score 25 or greater on Functional Gait Assessment in order to indicate low risk of falls.  Baseline: 22   Goal Status: INITIAL, as of 07/20/23     PLAN:  PT FREQUENCY: 1x/week  PT DURATION: 5 weeks  PLANNED INTERVENTIONS: 09811- PT Re-evaluation, 97110-Therapeutic exercises, 97530- Therapeutic activity, 97112- Neuromuscular re-education, 97535- Self Care, 91478- Manual therapy, [phone]- Aquatic Therapy, and Patient/Family education  PLAN FOR NEXT SESSION:  BW impact loading exercises, strength training (2-3 sets of 8) with focus on thoracic/lumbar extensors and BIL LE, dynamic and reactive balance activities; AVOID spinal flexion/twisting; address balance with eyes closed, backwards walking, pivoting/turning, reactionary balance   Berta Minor PTA 07/27/2023, 10:00 AM

## 2023-07-28 ENCOUNTER — Encounter: Payer: Self-pay | Admitting: Internal Medicine

## 2023-07-31 ENCOUNTER — Encounter: Payer: Self-pay | Admitting: Internal Medicine

## 2023-07-31 ENCOUNTER — Ambulatory Visit (INDEPENDENT_AMBULATORY_CARE_PROVIDER_SITE_OTHER): Payer: Medicare Other | Admitting: Internal Medicine

## 2023-07-31 VITALS — BP 130/75 | HR 73 | Ht 63.0 in | Wt 109.6 lb

## 2023-07-31 DIAGNOSIS — Z7184 Encounter for health counseling related to travel: Secondary | ICD-10-CM

## 2023-07-31 DIAGNOSIS — R053 Chronic cough: Secondary | ICD-10-CM

## 2023-07-31 DIAGNOSIS — A0472 Enterocolitis due to Clostridium difficile, not specified as recurrent: Secondary | ICD-10-CM

## 2023-07-31 DIAGNOSIS — Z87891 Personal history of nicotine dependence: Secondary | ICD-10-CM

## 2023-07-31 DIAGNOSIS — A159 Respiratory tuberculosis unspecified: Secondary | ICD-10-CM | POA: Diagnosis not present

## 2023-07-31 DIAGNOSIS — R918 Other nonspecific abnormal finding of lung field: Secondary | ICD-10-CM

## 2023-07-31 NOTE — Patient Instructions (Addendum)
ICD-10-CM   1. 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 Lung nodules  R91.8     3. Chronic cough  R05.3     4. Former heavy cigarette smoker (20-39 per day)  Z87.891     5. C. difficile diarrhea  A04.72     6. Travel advice encounter  Z71.84       MTB (Mycobacterium tuberculosis infection) Lung nodules Chronic cough  -Significantly improved and also CT scan of the chest November 2024 is hugely improved compared to April 2023 in January 2024.  Glad he finished antituberculous treatment  Plan - Expectant support and monitoring.  Former heavy cigarette smoker (20-39 per day)  -No evidence of lung cancer on CT chest November 2024.  At age 77 you have crossed the threshold for lung cancer screening although per American [employer] Society recommends CT scan till age 6  Plan - We can make a decision on getting another CT scan of the chest in November 2025 at the time of next follow-up.  C. difficile diarrhea - new issue  -Recent C. difficile diarrhea on July 07, 2023 and status post completion of p.o. vancomycin treatment but recurrence of diarrhea today 07/31/2023  Plan - Please call Dr. Chelsea Primus your gastroenterologist  Travel advice encounter to [place] in [place] in May 2025  Plan - Return in March 2025 for pretravel visit   Follow-up - Return in 4 months to see Dr. Marchelle Gearing   -RSI cough score at follow-up

## 2023-07-31 NOTE — Telephone Encounter (Signed)
I have reviewed  Previous scans also show calcification.   I know she has CAD   Nothing appeared severe/flow limiting Would follow clinically,  will follow lipids

## 2023-07-31 NOTE — Progress Notes (Signed)
OV 08/18/2022   OV 11/09/2021  Subjective:  Patient ID: [name], female , DOB: October 09, 1945 , age 77 y.o. , MRN: [id number] , ADDRESS: [address] [place] [address] PCP Ileana Ladd, MD Patient Care Team: Ileana Ladd, MD as PCP - General (Family Medicine)  This Provider for this visit: Treatment Team:  Attending Provider: Kalman Shan, MD    11/09/2021 -   Chief Complaint  Patient presents with   Consult    Pt had a CT performed which is the reason for today's visit.     HPI Turkey K Jahnke 77 y.o. -presents with her husband.  Concern is left lower lobe nodule 4 mm seen on CT angiogram abdomen done for renal artery aneurysm.  She sees physician at [facility] for renal artery aneurysm.  During this time she had a CT scan of the abdomen done with contrast here in [place].  This showed a 4 mm lung nodule in the left lower lobe.  According to Dr. Dorothey Baseman it is stable over 10 years.  I personally visualized this.  Some bibasilar atelectasis also reported although I am not convinced about this.  Patient herself states that overall she is stable.  She says that in July 2022 she had COVID and after that she had significant cough.  Primary care treated with cough Tessalon Perles.  The cough is now resolved for the last 5 weeks.  Currently no undue shortness of breath.  No cough no wheezing no paroxysmal nocturnal dyspnea no orthopnea.  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  She is a remote 30 pack smoker having quit in 1984 when she moved from [place] to [place].  While in [place] as a child she got exposed to her both grandfather and aunt with tuberculosis.  She says she herself was skin test positive but denies any latent TB treatment.  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  But this was just general air pollution.  She is worried about the implications of all this for her health.  She says clinically her previous primary care physician told her she might have COPD.  She also is a brother with COPD.  She wants to be sure that there is no undue health issues with her lung.  She also wants to continue doing ballroom dancing at a championship level.    CT Chest data 10/21/21   Narrative & Impression  CLINICAL DATA:  Renal artery aneurysm   EXAM: CT ANGIOGRAPHY ABDOMEN   TECHNIQUE: Multidetector CT imaging of the abdomen was performed using the standard protocol during bolus administration of intravenous contrast. Multiplanar reconstructed images and MIPs were obtained and reviewed to evaluate the vascular anatomy.   RADIATION DOSE REDUCTION: This exam was performed according to the departmental dose-optimization program which includes automated exposure control, adjustment of the mA and/or kV according to patient size and/or use of iterative reconstruction technique.   CONTRAST:  75mL OMNIPAQUE IOHEXOL 350 MG/ML SOLN   COMPARISON:  CT abdomen dated August 13, 2012   FINDINGS: VASCULAR   Aorta: Normal caliber abdominal aorta with mild calcified plaque and no significant stenosis.   Celiac: Normal caliber celiac artery with no significant atherosclerotic disease or stenosis.   SMA: Visualized  SMA is normal in caliber with no significant atherosclerotic disease or stenosis.   Renals: Peripherally calcified renal artery aneurysm arising from a lower pole segmental branch measuring 1.1 x 1.1 x 1.3 cm, unchanged in size when compared to prior exam. Bilateral main renal arteries are normal in caliber with minimal calcified plaque and no stenosis.   IMA: Visualized portion is normal in caliber with no evidence of stenosis.   Veins: No venous abnormality.   Review of the MIP images confirms the above  findings.   NON-VASCULAR   Lower chest: Stable solid 4 mm nodule of the left lower lobe located on image 13, likely benign. Bibasilar atelectasis.   Hepatobiliary: No focal liver abnormality is seen. No gallstones, gallbladder wall thickening, or biliary dilatation.   Pancreas: Unremarkable. No pancreatic ductal dilatation or surrounding inflammatory changes.   Spleen: Normal in size without focal abnormality.   Adrenals/Urinary Tract: Adrenal glands are unremarkable. Kidneys are normal, without renal calculi, focal lesion, or hydronephrosis. Bladder is unremarkable.   Stomach/Bowel: Visualized portions of the small and large bowel are unremarkable.   Lymphatic: No pathologically enlarged lymph nodes seen in the abdomen.   Other: No abdominal wall hernia or abdominal ascites.   Musculoskeletal: No acute or significant osseous findings.   IMPRESSION: 1. 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 2.  Aortic Atherosclerosis (ICD10-I70.0).     Electronically Signed   By: Allegra Lai M.D.   On: 10/22/2021 10:03       12/28/2021 Follow up : Lung nodule  Patient returns for a 44-month follow-up.  Patient was seen for pulmonary consult November 09, 2021 for abnormal CT chest that showed 4 mm lung nodule.  Patient also has some mild shortness of breath with activities.  Has a family history of TB and COPD.  She is a former smoker.  Patient was set up for a high-resolution CT chest that showed no enlarged lymph nodes.  A 0.4 cm nodule in the left lower lobe that is stable compared back to 2012.  Consistent with a benign etiology.  Clustered nodularity and consolidation in the right apex with associated parenchymal scarring as well in the left upper lobe.  Findings are consistent with possible atypical infection such as atypical Mycobacterium.  Alpha-1 testing was normal level and phenotype.  Patient says she has very minimum cough if any.  Shortness of breath is  very mild in nature.  Patient was set up for pulmonary function testing that was normal that showed FEV1 at 125%, ratio 80, FVC 117%.,  No significant bronchodilator response, DLCO 82%. She says that she gets bronchitis once or twice a year.  Usually resolves pretty easily.  She says most days she has no cough at all.  Is not limited by activities.  She did have COVID-19 in July 2022. Patient does have colitis and has issues with weight loss at times.  Says occasionally she has some swallow issues.  She denies any hemoptysis, discolored mucus, fever.   IMPRESSION: 1. Small pulmonary nodules of the dependent left lower lobe are stable on examinations dating back to at least 09/15/2010 and definitively benign. No further follow-up or characterization is indicated for these nodules. 2. Clustered centrilobular nodularity and consolidation of the medial right pulmonary apex with some associated pleuroparenchymal scarring, as well as of the peripheral posterior left upper lobe. 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 These findings are new in comparison to remote prior CT of the chest  dated 09/15/2010.   Aortic Atherosclerosis (ICD10-I70.0).     Electronically Signed   By: Jearld Lesch M.D.   On: 12/17/2021 13:00     06/30/2022 Patient presents today for acute office visit. She has a history of pulmonary nodules.  She had a CT in April 2023 that showed findings atypical mycobacterium. She has had a cough since August.  Primary care prescribed rifampin, ethambutol and azithromycin recently d/t her symptoms and image findings. She developed white tongue and sore throat after taking medication for 3 days. She did notice improvement in cough while on abx. She has had no sputum cultures. She has never had a bronchoscopy. No recent chest imaging. She plans to travel to [place] on 07/09/22. No significant shortness of breath except with coughing fits.  Cough can be productive with white-yellow phlegm. She is afebrile.    Subjective:  Patient ID: [name], female , DOB: 1946/02/02 , age 48 y.o. , MRN: [id number] , ADDRESS: [address] [place] [address] PCP Thana Ates, MD Patient Care Team: Thana Ates, MD as PCP - General (Internal Medicine)  This Provider for this visit: Treatment Team:  Attending Provider: Kalman Shan, MD    08/18/2022 -   Chief Complaint  Patient presents with   Follow-up    Pt states she is still coughing and also has chest congestion as well as tightness in chest. States her cough is worse in the evening and states she is coughing up white phlegm.     HPI Turkey K Rehfeld 77 y.o. -returns for follow-up.  Presents with her husband.  I saw her earlier in the year in 2023.  After that she has had visits with the nurse practitioner.  She tells me that the cough that started in the summer 2022 after COVID still persist.  After I saw her I ordered CT scan of the chest that showed nodularity that was clustered.  Her primary care physician empirically prescribed a triple antibiotic therapy for MAI infection.  However she developed a coated tongue.  Also given the empiric nature of the diagnosis we recommended she stop it.  Which she did.  After that in October 2023 she saw a Publishing rights manager.  Sputum culture grew haemophilus influenza and Candida.  She was given Z-Pak.  She did leave for her trip to [place] on 07/09/2022 but the trip got changed because of the [place] war.  Instead she went to [place] [employer] and from there it was a [place] cruise from 07/11/2022.  She travel to the [place] in [place].  She went to [place] she went to [place] and she came back to [place] via [place] on 07/24/2022.  Shortly after arriving she got COVID-19 for the second time.  She believes she contracted this in the flight.  During the trip her cough is significantly  worse rated at 8 out of 9.  The Z-Pak that she was prescribed she took it at that time and it did help but currently she is back with residual cough at the level 5 out of 10.  Chest feels tight.  There is occasional white stuff.  Particularly sputum.  She is quite miserable with the symptoms.  She also has a runny nose for the last 5 weeks.  She wants her symptoms addressed.  Labs show normal QuantiFERON gold but no allergy workup. Alpha-1 antitrypsin is normal. She is a former 30 pack smoking history.   Of note for  this visit I personally visualized the CT scan with the previous CT scan from April 2023.     OV 10/11/2022  Subjective:  Patient ID: [name], female , DOB: 11-Feb-1946 , age 63 y.o. , MRN: [id number] , ADDRESS: [address] [place] [facility] [address] PCP Thana Ates, MD Patient Care Team: Thana Ates, MD as PCP - General (Internal Medicine)  This Provider for this visit: Treatment Team:  Attending Provider: Kalman Shan, MD    10/11/2022 -   Chief Complaint  Patient presents with   Follow-up    Cough is improving. She has not started her spiriva due to high cost. Her cough is occ prod with white sputum. Appetite is good. No fevers, sweats.     HPI Turkey K Jeppsen 77 y.o. -returns for follow-up.  At this point in time under the coordination of Department of Public Health she is completed 2 weeks of Mycobacterium tuberculosis treatment and date labs of therapy.  She already states that her cough is improved significantly her chest tightness is improved significantly although residual symptoms are still there.  She is feeling better she is tolerating the antituberculous treatment well.  She is no longer considered contagious and has been allowed to go in public according to her history.  Her husband is with her but he is not providing any independent history today.  The test sputum culture was done by nurse practitioner in October 2023.  Right  prior to starting the antituberculous treatment we did a CT scan of the chest and a pulmonary right upper lobe nodules were actually getting worse.  A bronchoscopy was done early January 2024 and the nucleocapsid amplification of Mycobacterium tuberculosis negative but the culture is still pending and so far no growth.  Nevertheless she is feeling better.  Of note she had some mild emphysema on the CT scan we ordered Spiriva but it is too expensive and she is feeling better so we told her to hold off.   Her current response to typical treatment and terms of cough is profile below     CT cest 09/21/22   Narrative & Impression  CLINICAL DATA:  77 year old female presents for evaluation of chronic productive cough.   EXAM: CT CHEST WITHOUT CONTRAST   TECHNIQUE: Multidetector CT imaging of the chest was performed following the standard protocol without IV contrast.   RADIATION DOSE REDUCTION: This exam was performed according to the departmental dose-optimization program which includes automated exposure control, adjustment of the mA and/or kV according to patient size and/or use of iterative reconstruction technique.   COMPARISON:  December 17, 2021   FINDINGS: Cardiovascular: Calcified aortic atherosclerosis is mild. Normal caliber of the thoracic aorta. Normal heart size. Scattered coronary artery calcifications. Normal caliber of central pulmonary vasculature.   Mediastinum/Nodes: LEFT thyroid nodule up to 2.3 cm with areas of calcification. No adenopathy in the chest. Esophagus is grossly normal by CT.   Lungs/Pleura: Biapical scarring. More confluent area of nodularity in the RIGHT upper lobe has increased considerably since previous imaging 3.0 x 1.3 cm. Previously this was confined to an area along the medial and lateral surface of the apical pleural surfaces.   There are diffuse areas of nodularity in the RIGHT upper lobe metabolic so increased, some areas new since  previous imaging. Juxtapleural nodule in the anterior RIGHT upper lobe (image 36/5) 8 mm, this is a new finding. Grouped nodules in the RIGHT upper lobe and some tree in bud on  images 36 through 41 are also new.   Some improvement with respect to bronchovascular nodularity extending towards the RIGHT lung apex that was seen in the medial RIGHT upper lobe on the previous study. No signs of additional consolidative process or evidence of pleural effusion. Mild LEFT apical scarring.   Stable LEFT lower lobe nodule (image 132/5) 4 mm. Airways are patent. No consolidation or sign of pleural effusion.   Upper Abdomen: No acute upper abdominal findings to the extent evaluated.   Musculoskeletal: No chest wall mass.  Spinal degenerative changes.   IMPRESSION: 1. Resolution of some areas of nodularity with worsening of peripheral areas of nodularity in the RIGHT upper lobe. Findings favor sequela of infection perhaps MAI or atypical infection. 2. More confluent area of nodularity also having developed since previous imaging. Would suggest short interval follow-up to ensure that this area shows improvement and does not show persistent underlying masslike area or nodule. Would also consider correlation with respiratory symptoms and pulmonology consultation if not yet obtained. 3. LEFT thyroid nodule up to 2.3 cm with areas of calcification. Suggest follow-up thyroid ultrasound if not yet obtained. 4. Aortic atherosclerosis and coronary artery disease.   Aortic Atherosclerosis (ICD10-I70.0).     Electronically Signed   By: Donzetta Kohut M.D.   On: 09/22/2022 14:09     OV 01/17/2023  Subjective:  Patient ID: [name], female , DOB: July 31, 1946 , age 52 y.o. , MRN: [id number] , ADDRESS: [address] [place] [address] PCP Thana Ates, MD Patient Care Team: Thana Ates, MD as PCP - General (Internal Medicine)  This Provider for this visit: Treatment Team:   Attending Provider: Kalman Shan, MD  #Former heavy smoker 30 pack - #Right upper lobe nodule April 2023 worse in January 2024  -Sputum culture + October 2023 for Mycobacterium tuberculosis and also haemophilus  -Multiple QuantiFERON gold negative  -exposed to tuberculosis as an infant from her grandfather in the 1940s  01/17/2023 -   Chief Complaint  Patient presents with   Follow-up    CT F/up     HPI Turkey K Potteiger 77 y.o. -returns for follow-up.  Presents with her husband was an independent historian.  She now reports that her cough is even better with continued antituberculous treatment but beginning December 20, 2022 while on antituberculous treatment with close monitoring by the Department of Public Health she started having transaminitis.  Her AST went up to 84 and ALT went up to 108.  Then on Jan 13, 2023 AST was 114 and ALT was 144 and then on Jan 15, 2023 the antituberculous treatment was stopped.  She is not having any jaundice or discolored urine.  She has gained a little weight.  She says the cough is actually better.  She says she is getting good support from the [employer].  However she is worried about drug-induced liver injury.  Husband states she is really worried about this.  She is reading things on the Internet.  She says the public health department might switch her to another antituberculous treatment.  They are also requesting a right upper quadrant ultrasound.  She had CT scan of the chest May 2025 personally visualized it.  I am not fully sure if she is significantly improved or not compared to a year ago.  I asked for repeat read.  I did show her the CT report.  I also showed her the CT images.  In addition she did  have some vaginal yeast infection a month ago and took Diflucan pessary but now the vaginal itch is back.  Started having some pain in the right low back for the last 1 week.  She is got some dry mouth for the last 1 month.  She will  discuss this with primary care physician.       CT chest 01/13/23  Narrative & Impression  CLINICAL DATA:  Chronic cough, former smoker   EXAM: CT CHEST WITHOUT CONTRAST   TECHNIQUE: Multidetector CT imaging of the chest was performed following the standard protocol without IV contrast.   RADIATION DOSE REDUCTION: This exam was performed according to the departmental dose-optimization program which includes automated exposure control, adjustment of the mA and/or kV according to patient size and/or use of iterative reconstruction technique.   COMPARISON:  CT chest, 09/21/2022 thyroid ultrasound, 05/12/2022   FINDINGS: Cardiovascular: Aortic atherosclerosis. Normal heart size. Left and right coronary artery calcifications. No pericardial effusion.   Mediastinum/Nodes: No enlarged mediastinal, hilar, or axillary lymph nodes. Thyroid gland, trachea, and esophagus demonstrate no significant findings.   Lungs/Pleura: No significant change in an area of consolidation and volume loss of the right pulmonary apex (series 8, image 5). Adjacent clustered nodularity and small consolidations of overall improved, however are fluctuant, with a new nodule medially measuring 0.5 cm (series 8, image 22). Additional, occasional small nodules and ground-glass opacities elsewhere, for example a 0.4 cm nodule of the dependent left lower lobe, are unchanged (series 8, image 124). No pleural effusion or pneumothorax.   Upper Abdomen: No acute abnormality. Densely rim calcified aneurysm of the right renal artery or a branch arteriole measuring 1.3 x 1.2 cm (series 2, image 156).   Musculoskeletal: No chest wall abnormality. No acute osseous findings.   IMPRESSION: 1. No significant change in an area of consolidation and volume loss of the right pulmonary apex. Adjacent clustered nodularity and small consolidations of overall improved, however fluctuant. Findings are consistent with ongoing  atypical infection and chronic underlying sequelae, particularly atypical Mycobacterium. 2. Additional, occasional small nodules and ground-glass opacities elsewhere unchanged. 3. Incidental note of a densely rim calcified aneurysm of the right renal artery or a branch arteriole measuring 1.3 x 1.2 cm. 4. Coronary artery disease.   Aortic Atherosclerosis (ICD10-I70.0).     Electronically Signed   By: Jearld Lesch M.D.   On: 01/17/2023 12:18    OV 05/02/2023  Subjective:  Patient ID: [name], female , DOB: 23-Nov-1945 , age 77 y.o. , MRN: [id number] , ADDRESS: [address] [place] [address] PCP Thana Ates, MD Patient Care Team: Thana Ates, MD as PCP - General (Internal Medicine) Pricilla Riffle, MD as PCP - Cardiology (Cardiology)  This Provider for this visit: Treatment Team:  Attending Provider: Kalman Shan, MD    05/02/2023 -   Chief Complaint  Patient presents with   Follow-up    F/up on TB exposure     HPI Turkey K Quashie 77 y.o. -followup MTB/ Discussed in cased conference June 2024: Jan 13, 2023 and Sep 21, 2022 -> [place] and waingin CT but there are areas of improvement but also new areas of nodulairty.Overall slightly better.  Has also seen ID Dr Rutha Bouchard - he supports Dx of MTB/ Then 04/10/23 saw cardiology: reported improvement in DOE. Co Caslcium was 42nd percentile.  She presents now with her husband for follow-up.  She says her cough is completely resolved.  She is feeling great.  Her weight is good.  No shortness of breath no wheezing.  She completed direct observed therapy by the public health department on Friday, 04/28/2023.  She said they did a chest x-ray was clear.  She wants to have a CT scan of the chest.  We discussed that our case conference and I shared these findings with her.  She and her husband wanted know about travel history.  I did tell them she is free to travel.  They are going to go to [place] in [place] in May  2025 for their 60th wedding anniversary.  She also was asking about travel advice particularly masking around planes.  We went over this.  I did advise her to mask in airports and planes and in any indoor cluster.  Did advise her to be up-to-date with all her vaccines.  Did indicate that I will give her prednisone Paxlovid and antibiotic for travel to [place].  We went over her pneumonia vaccination history.  She is opted to take the Prevnar 20 today.  She will have flu shot and COVID shot on her own this fall 2024.     reatment started 09/21/2022 ([facility]) Has been on consolidation with RI Lft rising to 150s and tx hold 5/06   5/02 cbc 4.7/14/237; lft 114/144/94/0.4 (ast/alt/alkphos/tbili) 5/14 lft 90/123/87/0.5    OV 07/31/2023  Subjective:  Patient ID: [name], female , DOB: 02/22/1946 , age 22 y.o. , MRN: [id number] , ADDRESS: [address] [place] [address] PCP Thana Ates, MD Patient Care Team: Thana Ates, MD as PCP - General (Internal Medicine) Pricilla Riffle, MD as PCP - Cardiology (Cardiology)  This Provider for this visit: Treatment Team:  Attending Provider: Kalman Shan, MD    07/31/2023 -   Chief Complaint  Patient presents with   Follow-up    Ct f/u 11/1, pt states she has questions      HPI Turkey K Langer 77 y.o. -returns for follow-up.  Presents with her husband.  History is provided mostly by her but also review of the external medical record and to some extent the husband is an independent historian.After the last visit she was doing well.  However after coming of antituberculous treatment in August 2024 she felt she had a recurrence of her GI issues.  She is complaining of cramps particular in the suprapubic area along with diarrhea and bloating and abdominal pain.  She was evaluated by Dr. Barnie Mort at [facility].  I reviewed his notes.  Initially seen 06/19/2023.  By July 07, 2023 C. difficile antigen was  positive.  She took p.o. vancomycin.  She showed me those results.  Diarrhea got better she took p.o. vancomycin for 10 days.  But today the diarrhea is back and the cramps are beginning to come back.  She is worried the C. difficile is back.  She plans to call Dr. Zara Council today.  From  From a respiratory standpoint she is doing well.  She has very mild cough.  She is completed antituberculous treatment.  Weight is holding.  Cough score as below.  She had CT scan of the chest November 2024 and I compared the nodules compared to January 2024 and also April 2023.  In my personal visualization opinion these nodules are significantly improved.  There are some residual bronchiectasis.  I showed this to her.  Radiology feels it is stable but I feel it is improved.  She has travel coming up to [place]  and [place] for her Gerrie Nordmann wedding anniversary in May 2025.  She and I agreed for her to do a pretravel visit in March 2025.     Dr Gretta Cool Reflux Symptom Index (> 13-15 suggestive of [facility] cough) Pre RX  - ATb Rx Post start ATbx Rx - 2 weeks 07/31/2023 Completed ATT by Dept of Public health  Hoarseness of problem with voice 5 2 0  Clearing  Of Throat 5 3 0  Excess throat mucus or feeling of post nasal drip 5 3 1   Difficulty swallowing food, liquid or tablets 3 2 0  Cough after eating or lying down 5 3 0  Breathing difficulties or choking episodes 4 2 0  Troublesome or annoying cough 5 3 0  Sensation of something sticking in throat or lump in throat 5 2 0  Heartburn, chest pain, indigestion, or stomach acid coming up 3 2 0  TOTAL 40 21 0     Cultures: 1/02 afb sputum -- few mtb (S inh mic 0.1, rifampin mic 1, ethambutol mic 5; pza not done as not available currently) 2/14 sputum cx -- few mtb 4/03 sputum cx -- ngtd 4/04 sputum cx -- ngtd 4/17 sputum cx -- ngtd 4/18 sputum cx -- ngtd        CT Chest data from date: yes  - personally visualized and independently interpreted : yes - my findings are:  improved nodules arrative & Impression  CLINICAL DATA:  77 year old female with history of pulmonary nodules. History of mycobacterium tuberculosis exposure.   EXAM: CT CHEST WITHOUT CONTRAST   TECHNIQUE: Multidetector CT imaging of the chest was performed following the standard protocol without IV contrast.   RADIATION DOSE REDUCTION: This exam was performed according to the departmental dose-optimization program which includes automated exposure control, adjustment of the mA and/or kV according to patient size and/or use of iterative reconstruction technique.   COMPARISON:  Cardiac CT 03/02/2023.  Chest CT 01/13/2023.   FINDINGS: Cardiovascular: Heart size is normal. There is no significant pericardial fluid, thickening or pericardial calcification. There is aortic atherosclerosis, as well as atherosclerosis of the great vessels of the mediastinum and the coronary arteries, including calcified atherosclerotic plaque in the left anterior descending and right coronary arteries.   Mediastinum/Nodes: No pathologically enlarged mediastinal or hilar lymph nodes. Please note that accurate exclusion of hilar adenopathy is limited on noncontrast CT scans. Esophagus is unremarkable in appearance. No axillary lymphadenopathy.   Lungs/Pleura: Pleural-parenchymal thickening and areas of architectural distortion and nodularity again noted, most evident in the lung apices, very similar to the prior study. A few other scattered areas of mild cylindrical bronchiectasis and peripheral micro nodularity are noted elsewhere in the lungs, also similar to the prior study, most evident throughout the upper lobes of the lungs, generally measuring 4 mm or less in size. No definite new suspicious appearing pulmonary nodules or masses are noted. No acute consolidative airspace disease. No pleural effusions.   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 Atherosclerotic calcifications in the abdominal aorta.   Musculoskeletal: There are no aggressive appearing lytic or blastic lesions noted in the visualized portions of the skeleton.   IMPRESSION: 1. 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 No new or acute findings are noted. 2. Aortic atherosclerosis, in addition to two-vessel  coronary artery disease. Please note that although the presence of coronary artery calcium documents the presence of coronary artery disease, the severity of this disease and any potential stenosis cannot be assessed on this non-gated CT examination. Assessment for potential risk factor modification, dietary therapy or pharmacologic therapy may be warranted, if clinically indicated. 3. 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 This is unchanged. Follow-up nonemergent CTA of the abdomen is suggested in the near future to provide definitive characterization and establish a baseline for future follow-up examinations.   Aortic Atherosclerosis (ICD10-I70.0).     Electronically Signed   By: Trudie Reed M.D.   On: 07/28/2023 08:54      PFT     Latest Ref Rng & Units 12/28/2021   10:42 AM  ILD indicators  FVC-Pre L 3.18   FVC-Predicted Pre % 121   FVC-Post L 3.07   FVC-Predicted Post % 117   TLC L 4.40   TLC Predicted % 89   DLCO uncorrected ml/min/mmHg 15.17   DLCO UNC %Pred % 82   DLCO Corrected ml/min/mmHg 15.17   DLCO COR %Pred % 82       LAB RESULTS last 96 hours No results found.  LAB RESULTS last 90 days No results found for this or any previous visit (from the past 2160 hour(s)).       has a past medical history of Aneurysm of renal artery (HCC), Fracture of  right wrist (2011), GERD (gastroesophageal reflux disease), Hypertension, Left bundle branch block, Left thyroid nodule (2003), Lymphocytic colitis (03/2013), Migraines, Osteoporosis, PONV (postoperative nausea and vomiting), Sciatica, and Visceral injury.   reports that she quit smoking about 40 years ago. Her smoking use included cigarettes. She started smoking about 50 years ago. She has a 30 pack-year smoking history. She has never used smokeless tobacco.  Past Surgical History:  Procedure Laterality Date   ABDOMINOPLASTY  2010   APPENDECTOMY  1962   BREAST EXCISIONAL BIOPSY Bilateral 2001   x 3   BRONCHIAL WASHINGS  09/16/2022   Procedure: BRONCHIAL WASHINGS;  Surgeon: Kalman Shan, MD;  Location: [facility] ENDOSCOPY;  Service: Endoscopy;;   CATARACT EXTRACTION, BILATERAL Bilateral 01/2020   Dental implants  1993   HEMORRHOID SURGERY  2000   JOINT REPLACEMENT     KNEE ARTHROSCOPY W/ DEBRIDEMENT Right 07/2020   Dr. Luiz Blare   TOTAL KNEE ARTHROPLASTY Right 01/21/2022   Procedure: RIGHT TOTAL KNEE ARTHROPLASTY;  Surgeon: Kathryne Hitch, MD;  Location: [facility] ORS;  Service: Orthopedics;  Laterality: Right;  RFNA   VAGINAL HYSTERECTOMY  1974   Dysfunctional uterine bleeding.   VIDEO BRONCHOSCOPY N/A 09/16/2022   Procedure: VIDEO BRONCHOSCOPY WITHOUT FLUORO;  Surgeon: Kalman Shan, MD;  Location: [facility] ENDOSCOPY;  Service: Endoscopy;  Laterality: N/A;    Allergies  Allergen Reactions   Caine-1 [Lidocaine] Swelling    Allergic to Septocaine Articaine HCL 4% w/epi   Indomethacin Swelling   Other Diarrhea    Dairy Products    Articaine-Epinephrine Other (See Comments)   Gabapentin     "Twitches"   Hydrocodone Nausea Only   Latex Swelling    Possible allergy   Lamisil [Terbinafine] Rash    Immunization History  Administered Date(s) Administered   Fluad Quad(high Dose 65+) 05/29/2022, 05/14/2023   Hepatitis A 03/10/2003, 10/15/2003   Hepatitis B 04/15/2003, 05/21/2003,  10/15/2003   Influenza Split 06/09/2014, 06/08/2015, 06/08/2017, 06/19/2018, 05/27/2019, 05/18/2020, 05/31/2021   Influenza, High Dose  Seasonal PF 06/19/2018   Influenza,inj,Quad PF,6+ Mos 06/13/2011   Influenza-Unspecified 05/16/2013   PFIZER(Purple Top)SARS-COV-2 Vaccination 10/01/2019, 10/21/2019, 05/07/2020, 12/15/2020, 05/31/2021, 06/01/2022   Pneumococcal Conjugate-13 10/18/2013   Pneumococcal Polysaccharide-23 06/22/2010   Respiratory Syncytial Virus Vaccine,Recomb Aduvanted(Arexvy) 05/11/2022   Td 03/10/2003   Tdap 11/08/2010, 12/29/2019, 01/11/2020   Typhoid Inactivated 03/10/2003, 07/05/2013   Yellow Fever 07/05/2013   Zoster, Live 01/19/2006, 12/21/2016, 06/26/2017    Family History  Problem Relation Age of Onset   Hypertension Mother    Cancer Sister        melanoma   Autoimmune disease Sister        crest and sjor   Lupus Brother      Current Outpatient Medications:    albuterol (VENTOLIN HFA) 108 (90 Base) MCG/ACT inhaler, Inhale 2 puffs into the lungs every 6 (six) hours as needed for wheezing or shortness of breath., Disp: 8 g, Rfl: 2   amLODipine (NORVASC) 10 MG tablet, Take 10 mg by mouth daily., Disp: , Rfl:    ascorbic acid (VITAMIN C) 500 MG tablet, Take 500 mg by mouth daily., Disp: , Rfl:    atorvastatin (LIPITOR) 10 MG tablet, Take 0.5 tablets (5 mg total) by mouth daily., Disp: 45 tablet, Rfl: 3   botulinum toxin Type A (BOTOX) 100 units SOLR injection, Inject into the skin., Disp: , Rfl:    budesonide (ENTOCORT EC) 3 MG 24 hr capsule, Take 3 mg by mouth daily as needed (colitis flare)., Disp: , Rfl:    buPROPion (WELLBUTRIN XL) 150 MG 24 hr tablet, Take 1 tablet (150 mg total) by mouth daily., Disp: 90 tablet, Rfl: 1   chlorhexidine (PERIDEX) 0.12 % solution, SMARTSIG:By Mouth, Disp: , Rfl:    Cholecalciferol (VITAMIN D) 50 MCG (2000 UT) tablet, Take 2,000 Units by mouth daily., Disp: , Rfl:    clobetasol cream (TEMOVATE) 0.05 %, APPLY TOPICALLY TO THE  AFFECTED AREA TWICE DAILY. STOP WHEN SMOOTH. AVOID FACE AND BODY FOLDS, Disp: , Rfl:    ferrous sulfate 325 (65 FE) MG EC tablet, Take 325 mg by mouth every Monday., Disp: , Rfl:    finasteride (PROSCAR) 5 MG tablet, Take 5 mg by mouth daily., Disp: , Rfl:    L-CYSTEINE PO, Take 500 mg by mouth daily., Disp: , Rfl:    Melatonin 5 MG TABS, Take 5 mg by mouth at bedtime as needed (sleep)., Disp: , Rfl:    Menaquinone-7 (VITAMIN K2) 100 MCG CAPS, Take 100 mcg by mouth daily., Disp: , Rfl:    Multiple Vitamin (MULTIVITAMIN) tablet, Take 1 tablet by mouth daily., Disp: , Rfl:    omeprazole (PRILOSEC) 20 MG capsule, Take 20 mg by mouth 2 (two) times daily before a meal., Disp: , Rfl:    Potassium 99 MG TABS, Take 99 mg by mouth daily., Disp: , Rfl:    Povidone, PF, (IVIZIA DRY EYES) 0.5 % SOLN, Place 1 drop into both eyes at bedtime., Disp: , Rfl:    SUMAtriptan (IMITREX) 50 MG tablet, Take 50 mg by mouth every 2 (two) hours as needed for migraine., Disp: , Rfl:    terconazole (TERAZOL 7) 0.4 % vaginal cream, Place 1 applicator vaginally at bedtime., Disp: 45 g, Rfl: 0   traMADol (ULTRAM) 50 MG tablet, Take 50 mg by mouth 4 (four) times daily as needed., Disp: , Rfl:    triamcinolone ointment (KENALOG) 0.1 %, Apply to left nare daily as needed., Disp: , Rfl:    valsartan (DIOVAN) 320  MG tablet, Take 1 tablet (320 mg total) by mouth daily., Disp: 90 tablet, Rfl: 3   vitamin B-12 (CYANOCOBALAMIN) 500 MCG tablet, Take 500 mcg by mouth every other day., Disp: , Rfl:       Objective:   Vitals:   07/31/23 1414  BP: 130/75  Pulse: 73  SpO2: 97%  Weight: 109 lb 9.6 oz (49.7 kg)  Height: 5\' 3"  (1.6 m)    Estimated body mass index is 19.41 kg/m as calculated from the following:   Height as of this encounter: 5\' 3"  (1.6 m).   Weight as of this encounter: 109 lb 9.6 oz (49.7 kg).  @WEIGHTCHANGE @  Filed Weights   07/31/23 1414  Weight: 109 lb 9.6 oz (49.7 kg)     Physical Exam   General: No  distress. thin O2 at rest: no Cane present: no Sitting in wheel chair: no Frail: no Obese: no Neuro: Alert and Oriented x 3. GCS 15. Speech normal Psych: Pleasant Resp:  Barrel Chest - no.  Wheeze - no, Crackles - no, No overt respiratory distress CVS: Normal heart sounds. Murmurs - no Ext: Stigmata of Connective Tissue Disease - no HEENT: Normal upper airway. PEERL +. No post nasal drip        Assessment:       ICD-10-CM   1. 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 Lung nodules  R91.8     3. Chronic cough  R05.3     4. Former heavy cigarette smoker (20-39 per day)  Z87.891     5. C. difficile diarrhea  A04.72     6. Travel advice encounter  Z71.84          Plan:     Patient Instructions     ICD-10-CM   1. 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 Lung nodules  R91.8     3. Chronic cough  R05.3     4. Former heavy cigarette smoker (20-39 per day)  Z87.891     5. C. difficile diarrhea  A04.72     6. Travel advice encounter  Z71.84       MTB (Mycobacterium tuberculosis infection) Lung nodules Chronic cough  -Significantly improved and also CT scan of the chest November 2024 is hugely improved compared to April 2023 in January 2024.  Glad he finished antituberculous treatment  Plan - Expectant support and monitoring.  Former heavy cigarette smoker (20-39 per day)  -No evidence of lung cancer on CT chest November 2024.  At age 79 you have crossed the threshold for lung cancer screening although per [employer] Society recommends CT scan till age 77  Plan - We can make a decision on getting another CT scan of the chest in November 2025 at the time of next follow-up.  C. difficile diarrhea  -Recent C. difficile diarrhea on July 07, 2023 and status post completion of p.o. vancomycin treatment but recurrence of diarrhea today 07/31/2023  Plan - Please call Dr. Chelsea Primus your gastroenterologist  Travel advice  encounter to [place] in [place] in May 2025  Plan - Return in March 2025 for pretravel visit   Follow-up - Return in 4 months to see Dr. Marchelle Gearing   -RSI cough score at follow-up   FOLLOWUP Return in about 4 months (around 11/28/2023) for with Dr Marchelle Gearing, Face to Face Visit.    SIGNATURE    Dr. Kalman Shan, M.D., F.C.C.P,  Pulmonary and Critical Care Medicine  Staff Physician, [facility] Director - Interstitial Lung Disease  Program  Pulmonary Fibrosis [facility] at [facility] [place], [place], [address]  Pager: [phone], If no answer or between  15:00h - 7:00h: call [phone] Telephone: [phone]  2:44 PM 07/31/2023   Moderate Complexity MDM OFFICE  2021 E/M guidelines, first released in 2021, with minor revisions added in 2023 and 2024 Must meet the requirements for 2 out of 3 dimensions to qualify.    Number and complexity of problems addressed Amount and/or complexity of data reviewed Risk of complications and/or morbidity  One or more chronic illness with mild exacerbation, OR progression, OR  side effects of treatment  Two or more stable chronic illnesses  One undiagnosed new problem with uncertain prognosis  One acute illness with systemic symptoms   One Acute complicated injury Must meet the requirements for 1 of 3 of the categories)  Category 1: Tests and documents, historian  Any combination of 3 of the following:  Assessment requiring an independent historian  Review of prior external note(s) from each unique source  Review of results of each unique test  Ordering of each unique test    Category 2: Interpretation of tests   Independent interpretation of a test performed by another physician/other qualified health care professional (not separately reported)  Category 3: Discuss management/tests  Discussion of management or test interpretation with external physician/other qualified  health care professional/appropriate source (not separately reported) Moderate risk of morbidity from additional diagnostic testing or treatment Examples only:  Prescription drug management  Decision regarding minor surgery with identfied patient or procedure risk factors  Decision regarding elective major surgery without identified patient or procedure risk factors  Diagnosis or treatment significantly limited by social determinants of health

## 2023-08-03 ENCOUNTER — Ambulatory Visit: Payer: Medicare Other

## 2023-08-03 DIAGNOSIS — M6281 Muscle weakness (generalized): Secondary | ICD-10-CM

## 2023-08-03 NOTE — Therapy (Signed)
OUTPATIENT PHYSICAL THERAPY NOTE   Patient Name: Elaine Navarro MRN: [id number] DOB:03/17/1946, 77 y.o., female Today's Date: 08/03/2023  END OF SESSION:  PT End of Session - 08/03/23 0913     Visit Number 4    Number of Visits 5    Date for PT Re-Evaluation 08/09/23    Authorization Type MCR/BCBS    PT Start Time 0915    PT Stop Time 0955    PT Time Calculation (min) 40 min    Activity Tolerance Patient tolerated treatment well    Behavior During Therapy [facility] for tasks assessed/performed               Past Medical History:  Diagnosis Date   Aneurysm of renal artery (HCC)    followed at [facility], stable with CT/angiogram 10/2021   Fracture of right wrist 2011   Fell in bathroom.   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   Past Surgical History:  Procedure Laterality Date   ABDOMINOPLASTY  2010   APPENDECTOMY  1962   BREAST EXCISIONAL BIOPSY Bilateral 2001   x 3   BRONCHIAL WASHINGS  09/16/2022   Procedure: BRONCHIAL WASHINGS;  Surgeon: Kalman Shan, MD;  Location: [facility] ENDOSCOPY;  Service: Endoscopy;;   CATARACT EXTRACTION, BILATERAL Bilateral 01/2020   Dental implants  1993   HEMORRHOID SURGERY  2000   JOINT REPLACEMENT     KNEE ARTHROSCOPY W/ DEBRIDEMENT Right 07/2020   Dr. Luiz Blare   TOTAL KNEE ARTHROPLASTY Right 01/21/2022   Procedure: RIGHT TOTAL KNEE ARTHROPLASTY;  Surgeon: Kathryne Hitch, MD;  Location: [facility] ORS;  Service: Orthopedics;  Laterality: Right;  RFNA   VAGINAL HYSTERECTOMY  1974   Dysfunctional uterine bleeding.   VIDEO BRONCHOSCOPY N/A 09/16/2022   Procedure: VIDEO BRONCHOSCOPY WITHOUT FLUORO;  Surgeon: Kalman Shan, MD;  Location: [facility] ENDOSCOPY;  Service: Endoscopy;   Laterality: N/A;   Patient Active Problem List   Diagnosis Date Noted   Chronic cough 09/16/2022   Right lower lobe pulmonary infiltrate 09/16/2022   Abnormal CT of the chest 06/30/2022   Status post right knee replacement 01/21/2022   Arthritis of right knee 01/20/2022   Lung nodules 12/28/2021   Primary hypertension 05/13/2021   Thyroid nodule 05/13/2021   History of depression 05/13/2021   H/O: hysterectomy 05/13/2021   Postmenopausal 05/13/2021   Piriformis syndrome of left side 04/07/2021   Chronic pain of right knee 03/04/2021   H/O sciatica 11/04/2020   Age-related osteoporosis without current pathological fracture 11/04/2020   Vitamin D insufficiency 12/20/2019   Actinic keratosis 03/21/2017   Focal lymphocytic colitis 05/29/2013   Aneurysm artery, renal (HCC) 09/28/2012   Headache, chronic migraine without aura, intractable 03/02/2012    PCP: Thana Ates, MD  REFERRING PROVIDER: Talmage Coin, MD   REFERRING DIAG:  Age-related osteoporosis with current pathological fracture, unspecified site, initial encounter for fracture [M80.00XA]   THERAPY DIAG:  Muscle weakness (generalized)  Rationale for Evaluation and Treatment: Rehabilitation  ONSET DATE: 06/23/23 (date of referral)   SUBJECTIVE:   SUBJECTIVE STATEMENT: Patient reports that she felt good after the last session and that it was "eye opening." She has been  compliant with her HEP.   Eval: Patient reports to PT d/t osteoporosis of L>R LE to address LE strength, balance. She reports a history of L LE sciatic nerve pain which has been improving and Rt TKA which is doing very well. She denies presence of pain and states that she was referred to PT because of bone loss. However, she continues to live a very active life including 2 hours of competitive ballroom dancing daily, walking, golfing, and some weight lifting at the gym. She did have a previous wrist fracture, which she is having no issues with at this  time.   PERTINENT HISTORY: PMHx includes: Aneurysm of renal artery, Rt wrist fracture d/t fall (2011), HTN, lymphocytic colitis, migraines, osteoporosis, Rt TKA (2023), Lt Piriformis syndrome and h/o sciatica, Vitamin D insufficiency    PAIN:  Are you having pain? No  PRECAUTIONS: Other: Osteoporosis   RED FLAGS: None   WEIGHT BEARING RESTRICTIONS: No  FALLS:  Has patient fallen in last 6 months? 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  DIAGNOSTIC FINDINGS:   04/21/23 Bone Density Scan  ASSESSMENT: The BMD measured at Femur Neck Left is 0.664 g/cm2 with a T-score of -2.7. This patient is considered osteoporotic according to World Health Organization [facility]) criteria.   The quality of the exam is good. L3 and L4 were excluded due to degenerative changes as noted on previous exam.  PATIENT SURVEYS:  FOTO 97  COGNITION: Overall cognitive status: Within functional limits for tasks assessed     SENSATION: Not tested   POSTURE: forward head and increased thoracic kyphosis  LOWER EXTREMITY ROM: Grossly WFL Bilaterally    LOWER EXTREMITY MMT:  MMT Right eval Left eval  Hip flexion 4+ 4+  Hip extension 4 4-  Hip abduction 4+ 4+  Hip adduction 5 5  Hip internal rotation 4- 5  Hip external rotation 5 4+  Knee flexion 5 4+  Knee extension 5 4+  Ankle dorsiflexion 5 4+  Ankle plantarflexion 4+ 4+  Ankle inversion 4+ 4+  Ankle eversion 4+ 4+     FUNCTIONAL TESTS:   Static Balance    Romberg EO on floor  30 sec   Romberg EC on floor 30 sec  Romberg EO on airex 30 sec  Romberg EC on airex     30 sec    Static Balance Right Left   Tandem on airex 30 sec 30 sec  SLS on floor  30 sec 30 sec     GAIT: Distance walked: 100 ft Assistive device utilized: None Level of assistance: Complete Independence Comments: Mildly forward flexed posture, with increased thoracic kyphosis  Functional Gait Assessment: 22/30 as of 07/20/23   TODAY'S TREATMENT:             AVOID spinal flexion/twisting  [facility] Adult PT Treatment:  DATE: 08/03/23  Therapeutic Exercise:  At parallel bars: Standing hip abduction/extension 2x10 ea BIL Consider adding resistance/ankle weight at next visit  Standing "swimmers" alternating UE/LE x10 each Consider adding resistance/ankle weight at next visit   Therapeutic Activity  at parallel bars: Airex beam, hurdles x 2, Airex pad between hurdles, x 3 laps Airex beam fwd/bwd tandem walking SLS with circles on 5000g ball x 15 each CW/CCW BIL from airex Tandem stance on Airex EO 2x30" BIL FT on Airex with perturbation, 2 x 1 min (decreasing duration and increased resistance during second round)  Random practice - Cone Taps, 2 x 2 minutes   Initial round: 1 min per LE, varied pattern per clinician instruction  Second round: varied pattern per clinician instruction, included randomized LE  Resisted walking lateral, 2 laps with RTB Resisted walking fwd/back, 2 laps with RTB   [facility] Adult PT Treatment:                                                DATE: 07/27/23 Therapeutic Exercise: In // bars: Standing hip abduction/extension 2x10 ea BIL Standing "swimmers" alternating UE/LE x10 BIL Neuromuscular re-ed: Balance activity at parallel bar Airex beam, hurdles x 2, Airex pad between hurdles, x 3 laps Airex beam fwd/bwd tandem walking Backwards walking with reactionary balance training from clinician resistance SLS with circles on  5000g ball x30" CW/CCW BIL Tandem stance on Airex EO x30" BIL FT on Airex EC x30"  FT on Airex with perturbation    [facility] Adult PT Treatment:                                                DATE: 07/20/2023  Therapeutic Exercise: 3 x 8   Prone hip extension with 3# ankle weight, 3 x 10 Prone Knee Bend, 2 x 8 BIL Prone on Elbow, 3 x 30 sec  "Swimmers", alternating upper extremities/lower extremities from prone 2 x 5 each limb Created, and reviewed HEP  Therapeutic Activity: Balance activity at parallel bar Airex beam, hurdles x 2, Airex pad between hurdles, x 3 laps Dynamic gait assessment as noted above and reviewed indications of results                                                                                                                  PATIENT EDUCATION:  Education details: reviewed initial home exercise program; discussion of POC, prognosis and goals for skilled PT   Person educated: Patient Education method: Explanation, Demonstration, and Handouts Education comprehension: verbalized understanding, returned demonstration, and needs further education  HOME EXERCISE PROGRAM: Access Code: [email or web address] URL: [email or web address] Date: 07/20/2023 Prepared by: Mauri Reading  Exercises - Prone Alternating Arm and Leg Lifts  - 1 x daily - 3  x weekly - 2 sets - 5 reps - Prone Knee Flexion  - 1 x daily - 3 x weekly - 2 sets - 10 reps - Prone Press Up On Elbows  - 1 x daily - 3 x weekly - 3 sets - 30 sec hold  ASSESSMENT:  CLINICAL IMPRESSION: [place] continues to tolerate gradual progression of exercises well, without adverse effects. She had one instance of LOB today, demonstrating safe recovery strategy. She was somewhat limited by right knee pain today, which she will f/u with MD about soon. Anticipating d/c from skilled PT at next visit.       OBJECTIVE IMPAIRMENTS: decreased strength and postural dysfunction.   ACTIVITY LIMITATIONS:  lifting  PARTICIPATION LIMITATIONS: community activity  PERSONAL FACTORS: Age and 3+ comorbidities: PMHx includes: Aneurysm of renal artery, Rt wrist fracture d/t fall (2011), HTN, lymphocytic colitis, migraines, osteoporosis, Rt TKA (2023), Lt Piriformis syndrome and h/o sciatica, Vitamin D insufficiency    are also affecting patient's functional outcome.   REHAB POTENTIAL: Good  CLINICAL DECISION MAKING: Stable/uncomplicated  EVALUATION COMPLEXITY: Low   GOALS: Goals reviewed with patient? Yes  SHORT TERM GOALS: = LONG TERM GOALS: Target date: 08/09/2023   Patient will demonstrate improved LE strength to at least 4+/5 MMT in BIL LE.  Baseline:  MMT Right eval Left eval  Hip flexion 4+ 4+  Hip extension 4 4-  Hip abduction 4+ 4+  Hip adduction 5 5  Hip internal rotation 4- 5  Hip external rotation 5 4+  Knee flexion 5 4+  Knee extension 5 4+  Ankle dorsiflexion 5 4+  Ankle plantarflexion 4+ 4+  Ankle inversion 4+ 4+  Ankle eversion 4+ 4+   Goal status: INITIAL  2. Patient will be independent with initial home program with focus on LE strengthening and TS/LS extensor mm.  Baseline: to be provided at f/u visit Goal status: INITIAL  3.  Patient will demonstrate improved thoracolumbar extension while standing and seated.  Baseline: see objective measures Goal status: INITIAL  4. Patient will score 25 or greater on Functional Gait Assessment in order to indicate low risk of falls.  Baseline: 22   Goal Status: INITIAL, as of 07/20/23     PLAN:  PT FREQUENCY: 1x/week  PT DURATION: 5 weeks  PLANNED INTERVENTIONS: 40981- PT Re-evaluation, 97110-Therapeutic exercises, 97530- Therapeutic activity, 97112- Neuromuscular re-education, 97535- Self Care, 19147- Manual therapy, [phone]- Aquatic Therapy, and Patient/Family education  PLAN FOR NEXT SESSION:  BW impact loading exercises, strength training (2-3 sets of 8) with focus on thoracic/lumbar extensors and BIL LE,  dynamic and reactive balance activities; AVOID spinal flexion/twisting; address balance with eyes closed, backwards walking, pivoting/turning, reactionary balance   Mauri Reading, PT, DPT   08/03/2023, 12:20 PM

## 2023-08-05 LAB — HELIX MOLECULAR SCREEN: Genetic Analysis Overall Interpretation: NEGATIVE

## 2023-08-05 LAB — GENECONNECT MOLECULAR SCREEN

## 2023-08-07 ENCOUNTER — Encounter: Payer: Self-pay | Admitting: Physical Therapy

## 2023-08-07 ENCOUNTER — Ambulatory Visit: Payer: Medicare Other

## 2023-08-07 DIAGNOSIS — M6281 Muscle weakness (generalized): Secondary | ICD-10-CM

## 2023-08-07 NOTE — Therapy (Signed)
OUTPATIENT PHYSICAL THERAPY NOTE   Patient Name: Elaine Navarro MRN: [id number] DOB:1945/10/24, 77 y.o., female Today's Date: 08/07/2023   PHYSICAL THERAPY DISCHARGE SUMMARY  Visits from Start of Care: 5   Current functional level related to goals / functional outcomes: See objective findings/assessment   Remaining deficits: See objective findings/assessment    Education / Equipment: See today's treatment/assessment      Patient agrees to discharge. Patient goals were met. Patient is being discharged due to meeting the stated rehab goals.    END OF SESSION:  PT End of Session - 08/07/23 1003     Visit Number 5    Number of Visits 5    Date for PT Re-Evaluation 08/09/23    Authorization Type MCR/BCBS    Progress Note Due on Visit 5    PT Start Time 1002    PT Stop Time 1045    PT Time Calculation (min) 43 min    Activity Tolerance Patient tolerated treatment well    Behavior During Therapy WFL for tasks assessed/performed               Past Medical History:  Diagnosis Date   Aneurysm of renal artery (HCC)    followed at [facility], stable with CT/angiogram 10/2021   Fracture of right wrist 2011   Fell in bathroom.   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   Past Surgical History:  Procedure Laterality Date   ABDOMINOPLASTY  2010   APPENDECTOMY  1962   BREAST EXCISIONAL BIOPSY Bilateral 2001   x 3   BRONCHIAL WASHINGS  09/16/2022   Procedure: BRONCHIAL WASHINGS;  Surgeon: Kalman Shan, MD;  Location: [facility] ENDOSCOPY;  Service: Endoscopy;;   CATARACT EXTRACTION, BILATERAL Bilateral 01/2020   Dental implants  1993   HEMORRHOID SURGERY  2000   JOINT REPLACEMENT     KNEE ARTHROSCOPY W/  DEBRIDEMENT Right 07/2020   Dr. Luiz Blare   TOTAL KNEE ARTHROPLASTY Right 01/21/2022   Procedure: RIGHT TOTAL KNEE ARTHROPLASTY;  Surgeon: Kathryne Hitch, MD;  Location: [facility] ORS;  Service: Orthopedics;  Laterality: Right;  RFNA   VAGINAL HYSTERECTOMY  1974   Dysfunctional uterine bleeding.   VIDEO BRONCHOSCOPY N/A 09/16/2022   Procedure: VIDEO BRONCHOSCOPY WITHOUT FLUORO;  Surgeon: Kalman Shan, MD;  Location: [facility] ENDOSCOPY;  Service: Endoscopy;  Laterality: N/A;   Patient Active Problem List   Diagnosis Date Noted   Chronic cough 09/16/2022   Right lower lobe pulmonary infiltrate 09/16/2022   Abnormal CT of the chest 06/30/2022   Status post right knee replacement 01/21/2022   Arthritis of right knee 01/20/2022   Lung nodules 12/28/2021   Primary hypertension 05/13/2021   Thyroid nodule 05/13/2021   History of depression 05/13/2021   H/O: hysterectomy 05/13/2021   Postmenopausal 05/13/2021   Piriformis syndrome of left side 04/07/2021   Chronic pain of right knee 03/04/2021   H/O sciatica 11/04/2020   Age-related osteoporosis without current pathological fracture 11/04/2020   Vitamin D insufficiency 12/20/2019   Actinic keratosis 03/21/2017   Focal lymphocytic colitis 05/29/2013   Aneurysm artery, renal (HCC) 09/28/2012   Headache, chronic migraine without aura, intractable 03/02/2012  PCP: Thana Ates, MD  REFERRING PROVIDER: Talmage Coin, MD   REFERRING DIAG:  Age-related osteoporosis with current pathological fracture, unspecified site, initial encounter for fracture [M80.00XA]   THERAPY DIAG:  Muscle weakness (generalized)  Rationale for Evaluation and Treatment: Rehabilitation  ONSET DATE: 06/23/23 (date of referral)   SUBJECTIVE:   SUBJECTIVE STATEMENT: Patient reports that she feels she can be independent with HEP after today. She indicates feeling that PT has been "eye-opening."    PERTINENT HISTORY: PMHx includes: Aneurysm of renal artery, Rt  wrist fracture d/t fall (2011), HTN, lymphocytic colitis, migraines, osteoporosis, Rt TKA (2023), Lt Piriformis syndrome and h/o sciatica, Vitamin D insufficiency    PAIN:  Are you having pain? No  PRECAUTIONS: Other: Osteoporosis   RED FLAGS: None   WEIGHT BEARING RESTRICTIONS: No  FALLS:  Has patient fallen in last 6 months? 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  DIAGNOSTIC FINDINGS:   04/21/23 Bone Density Scan  ASSESSMENT: The BMD measured at Femur Neck Left is 0.664 g/cm2 with a T-score of -2.7. This patient is considered osteoporotic according to World Health Organization [facility]) criteria.   The quality of the exam is good. L3 and L4 were excluded due to degenerative changes as noted on previous exam.  PATIENT SURVEYS:  FOTO 97 08/07/2023 93%  COGNITION: Overall cognitive status: Within functional limits for tasks assessed     SENSATION: Not tested   POSTURE: forward head and increased thoracic kyphosis  LOWER EXTREMITY ROM: Grossly WFL Bilaterally    LOWER EXTREMITY MMT:  MMT Right eval Left eval 08/07/2023 Right 08/07/2023 Left  Hip flexion 4+ 4+ 5 4+  Hip extension 4 4- 4 4+  Hip abduction 4+ 4+ 4+ 4+  Hip adduction 5 5 5 5   Hip internal rotation 4- 5 4+ 5  Hip external rotation 5 4+ 5 4+  Knee flexion 5 4+ 5 5  Knee extension 5 4+ 5 5  Ankle dorsiflexion 5 4+ 5 5  Ankle plantarflexion 4+ 4+ 4+ 5  Ankle inversion 4+ 4+ 4+ 5  Ankle eversion 4+ 4+ 5 5     FUNCTIONAL TESTS:   Static  Balance    Romberg EO on floor  30 sec  Romberg EC on floor 30 sec  Romberg EO on airex 30 sec  Romberg EC on airex     30 sec    Static Balance Right Left   Tandem on airex 30 sec 30 sec  SLS on floor  30 sec 30 sec     GAIT: Distance walked: 100 ft Assistive device utilized: None Level of assistance: Complete Independence Comments: Mildly forward flexed posture, with increased thoracic kyphosis  Functional Gait Assessment: 22/30 as of 07/20/23   TODAY'S TREATMENT:              [facility] Adult PT Treatment:  DATE: 08/07/23  Therapeutic Activity:  Reassessment of objective measures and subjective assessment regarding progress towards established goals and plan for independence with prescribed home program following discharged from PT   [facility] Adult PT Treatment:                                                DATE: 08/03/23  Therapeutic Exercise:  At parallel bars: Standing hip abduction/extension 2x10 ea BIL Consider adding resistance/ankle weight at next visit  Standing "swimmers" alternating UE/LE x10 each Consider adding resistance/ankle weight at next visit   Therapeutic Activity  at parallel bars: Airex beam, hurdles x 2, Airex pad between hurdles, x 3 laps Airex beam fwd/bwd tandem walking SLS with circles on 5000g ball x 15 each CW/CCW BIL from airex Tandem stance on Airex EO 2x30" BIL FT on Airex with perturbation, 2 x 1 min (decreasing duration and increased resistance during second round)  Random practice - Cone Taps, 2 x 2 minutes   Initial round: 1 min per LE, varied pattern per clinician instruction  Second round: varied pattern per clinician instruction, included randomized LE  Resisted walking lateral, 2 laps with RTB Resisted walking fwd/back, 2 laps with RTB   [facility] Adult PT Treatment:                                                DATE: 07/27/23 Therapeutic Exercise: In // bars: Standing hip  abduction/extension 2x10 ea BIL Standing "swimmers" alternating UE/LE x10 BIL Neuromuscular re-ed: Balance activity at parallel bar Airex beam, hurdles x 2, Airex pad between hurdles, x 3 laps Airex beam fwd/bwd tandem walking Backwards walking with reactionary balance training from clinician resistance SLS with circles on 5000g ball x30" CW/CCW BIL Tandem stance on Airex EO x30" BIL FT on Airex EC x30"  FT on Airex with perturbation    [facility] Adult PT Treatment:                                                DATE: 07/20/2023  Therapeutic Exercise: 3 x 8   Prone hip extension with 3# ankle weight, 3 x 10 Prone Knee Bend, 2 x 8 BIL Prone on Elbow, 3 x 30 sec  "Swimmers", alternating upper extremities/lower extremities from prone 2 x 5 each limb Created, and reviewed HEP  Therapeutic Activity: Balance activity at parallel bar Airex beam, hurdles x 2, Airex pad between hurdles, x 3 laps Dynamic gait assessment as noted above and reviewed indications of results  PATIENT EDUCATION:  Education details: reviewed initial home exercise program; discussion of POC, prognosis and goals for skilled PT   Person educated: Patient Education method: Explanation, Demonstration, and Handouts Education comprehension: verbalized understanding, returned demonstration, and needs further education  HOME EXERCISE PROGRAM: Access Code: [email or web address] URL: [email or web address] Date: 08/07/2023 Prepared by: Mauri Reading  Exercises - Prone Press Up On Elbows  - 1 x daily - 3 x weekly - 3 sets - 30-60 sec hold - Prone Alternating Arm and Leg Lifts  - 1 x daily - 3 x weekly - 2 sets - 5 reps - Clamshell with Resistance  - 1 x daily - 3 x weekly - 2 sets - 10 reps - Sidelying Reverse Clamshell with Resistance  - 1 x daily - 3 x weekly - 2 sets - 10 reps - Bird Dog on Counter  - 1 x  daily - 3 x weekly - 2 sets - 10 reps  ASSESSMENT:  CLINICAL IMPRESSION: Elaine Navarro has attended 5 total PT visits and is demonstrating readiness for discharge from skilled PT today. She has met all established rehab goals, including improved score on functional gait assessment, demonstrating decreased overall fall risk. She has improved LE strength, and has been independent with prescribed HEP. She will be discharged from skilled PT at this time to continue with current level of activity, and incorporation of strength training as discussed during our treatment sessions.     OBJECTIVE IMPAIRMENTS: decreased strength and postural dysfunction.   ACTIVITY LIMITATIONS: lifting  PARTICIPATION LIMITATIONS: community activity  PERSONAL FACTORS: Age and 3+ comorbidities: PMHx includes: Aneurysm of renal artery, Rt wrist fracture d/t fall (2011), HTN, lymphocytic colitis, migraines, osteoporosis, Rt TKA (2023), Lt Piriformis syndrome and h/o sciatica, Vitamin D insufficiency    are also affecting patient's functional outcome.   REHAB POTENTIAL: Good  CLINICAL DECISION MAKING: Stable/uncomplicated  EVALUATION COMPLEXITY: Low   GOALS: Goals reviewed with patient? Yes  SHORT TERM GOALS: = LONG TERM GOALS: Target date: 08/09/2023   Patient will demonstrate improved LE strength to at least 4+/5 MMT in BIL LE.  Baseline:  MMT Right eval Left eval 08/07/2023 Right 08/07/2023 Left  Hip flexion 4+ 4+ 5 4+  Hip extension 4 4- 4 4+  Hip abduction 4+ 4+ 4+ 4+  Hip adduction 5 5 5 5   Hip internal rotation 4- 5 4+ 5  Hip external rotation 5 4+ 5 4+  Knee flexion 5 4+ 5 5  Knee extension 5 4+ 5 5  Ankle dorsiflexion 5 4+ 5 5  Ankle plantarflexion 4+ 4+ 4+ 5  Ankle inversion 4+ 4+ 4+ 5  Ankle eversion 4+ 4+ 5 5   Goal status: MET, with exception of R hip extension   2. Patient will be independent with initial home program with focus on LE strengthening and TS/LS extensor mm.  Baseline: to  be provided at f/u visit Goal status: MET  3.  Patient will demonstrate improved thoracolumbar extension while standing and seated.  Baseline: see objective measures Goal status: MET 08/07/2023  4. Patient will score 25 or greater on Functional Gait Assessment in order to indicate low risk of falls.  Baseline: 22 08/07/23: 27   Goal Status: MET     PLAN:  PT FREQUENCY: 1x/week  PT DURATION: 5 weeks  PLANNED INTERVENTIONS: 97164- PT Re-evaluation, 97110-Therapeutic exercises, 97530- Therapeutic activity, 97112- Neuromuscular re-education, 97535- Self Care, 62130- Manual therapy, [phone]- Aquatic Therapy, and Patient/Family education  PLAN FOR NEXT SESSION:  d/c with independent  HEP    Mekai Wilkinson PT, DPT, LAT, ATC  08/07/23  10:57 AM    Mauri Reading, PT, DPT   08/07/2023, 10:57 AM

## 2023-08-28 ENCOUNTER — Telehealth: Payer: Self-pay | Admitting: Internal Medicine

## 2023-08-28 ENCOUNTER — Other Ambulatory Visit (HOSPITAL_COMMUNITY): Payer: Self-pay

## 2023-08-28 DIAGNOSIS — G43719 Chronic migraine without aura, intractable, without status migrainosus: Secondary | ICD-10-CM | POA: Diagnosis not present

## 2023-08-28 MED ORDER — ISOSORBIDE MONONITRATE ER 30 MG PO TB24
30.0000 mg | ORAL_TABLET | Freq: Every day | ORAL | 3 refills | Status: DC
  Filled 2023-08-28: qty 90, 90d supply, fill #0

## 2023-08-28 NOTE — Telephone Encounter (Signed)
PT had a CCTA in spring 2024 that showed minimal CAD  ? Spasm   She is on amlodipine   COuld try very low dose Imdur  30 mg    It may also be noncardiac

## 2023-08-28 NOTE — Telephone Encounter (Signed)
Pt called to report that she has been having on and off chest tightness for about a week in the afternoon specifically and when she is up doing things around the house... she says it lasts for a very short time but there was one time it lasted at least 2 min relieved with rest.   She denies SOB, no appt spots available appt in the next several weeks with an APP or provider.   Will continue to watch for openings.   Pt BP has been good according to her with a few spikes in her systolic to 140... diastolic staying in the low 80's. She will start keeping a log.   Pt will call back if anything worsens or changes and consider ED if needed.   Will send to Dr Tenny Craw for review.

## 2023-08-28 NOTE — Telephone Encounter (Signed)
   Pt c/o of Chest Pain: STAT if active CP, including tightness, pressure, jaw pain, radiating pain to shoulder/upper arm/back, CP unrelieved by Nitro. Symptoms reported of SOB, nausea, vomiting, sweating.  1. Are you having CP right now?  No    2. Are you experiencing any other symptoms (ex. SOB, nausea, vomiting, sweating)? No    3. Is your CP continuous or coming and going? Coming and going    4. Have you taken Nitroglycerin? No    5. How long have you been experiencing CP?  About a week    6. If NO CP at time of call then end call with telling Pt to call back or call 911 if Chest pain returns prior to return call from triage team.

## 2023-08-28 NOTE — Telephone Encounter (Signed)
Pt advised and will call her PCP for review.

## 2023-08-29 ENCOUNTER — Other Ambulatory Visit: Payer: Self-pay

## 2023-08-30 ENCOUNTER — Ambulatory Visit (INDEPENDENT_AMBULATORY_CARE_PROVIDER_SITE_OTHER): Payer: Medicare Other | Admitting: Orthopaedic Surgery

## 2023-08-30 ENCOUNTER — Other Ambulatory Visit (INDEPENDENT_AMBULATORY_CARE_PROVIDER_SITE_OTHER): Payer: Self-pay

## 2023-08-30 DIAGNOSIS — Z96651 Presence of right artificial knee joint: Secondary | ICD-10-CM | POA: Diagnosis not present

## 2023-08-30 NOTE — Progress Notes (Signed)
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  She feels like the knee does get stiff there and there is a popping sensation.  We replaced her right knee in May 2023.  On exam today her right knee exam is entirely normal.  There is no effusion.  Her range of motion is full and the knee is ligamentously stable.  It is not hot.  X-rays of the right knee show well-seated total knee arthroplasty with no evidence of loosening and no complicating features.  The alignment is neutral.  This gave her reassurance that she is doing well.  She even demonstrated standing on that right knee on her own and demonstrated good balance coordination.  This did make her happy seeing her x-rays and her exam findings.  If she does have any issues

## 2023-09-05 DIAGNOSIS — R0789 Other chest pain: Secondary | ICD-10-CM | POA: Diagnosis not present

## 2023-09-05 DIAGNOSIS — L659 Nonscarring hair loss, unspecified: Secondary | ICD-10-CM | POA: Diagnosis not present

## 2023-09-05 DIAGNOSIS — M810 Age-related osteoporosis without current pathological fracture: Secondary | ICD-10-CM | POA: Diagnosis not present

## 2023-09-12 DIAGNOSIS — K52832 Lymphocytic colitis: Secondary | ICD-10-CM | POA: Diagnosis not present

## 2023-09-12 DIAGNOSIS — K52831 Collagenous colitis: Secondary | ICD-10-CM | POA: Diagnosis not present

## 2023-09-12 DIAGNOSIS — Z92241 Personal history of systemic steroid therapy: Secondary | ICD-10-CM | POA: Diagnosis not present

## 2023-09-12 DIAGNOSIS — M810 Age-related osteoporosis without current pathological fracture: Secondary | ICD-10-CM | POA: Diagnosis not present

## 2023-09-19 DIAGNOSIS — F4321 Adjustment disorder with depressed mood: Secondary | ICD-10-CM | POA: Diagnosis not present

## 2023-09-19 DIAGNOSIS — M25552 Pain in left hip: Secondary | ICD-10-CM | POA: Diagnosis not present

## 2023-09-19 DIAGNOSIS — M47816 Spondylosis without myelopathy or radiculopathy, lumbar region: Secondary | ICD-10-CM | POA: Diagnosis not present

## 2023-09-19 DIAGNOSIS — G894 Chronic pain syndrome: Secondary | ICD-10-CM | POA: Diagnosis not present

## 2023-09-21 ENCOUNTER — Other Ambulatory Visit (HOSPITAL_BASED_OUTPATIENT_CLINIC_OR_DEPARTMENT_OTHER): Payer: Self-pay | Admitting: Obstetrics & Gynecology

## 2023-09-21 DIAGNOSIS — F419 Anxiety disorder, unspecified: Secondary | ICD-10-CM

## 2023-10-18 DIAGNOSIS — M810 Age-related osteoporosis without current pathological fracture: Secondary | ICD-10-CM | POA: Diagnosis not present

## 2023-10-18 DIAGNOSIS — E042 Nontoxic multinodular goiter: Secondary | ICD-10-CM | POA: Diagnosis not present

## 2023-10-18 DIAGNOSIS — E871 Hypo-osmolality and hyponatremia: Secondary | ICD-10-CM | POA: Diagnosis not present

## 2023-10-18 DIAGNOSIS — Z8781 Personal history of (healed) traumatic fracture: Secondary | ICD-10-CM | POA: Diagnosis not present

## 2023-10-18 DIAGNOSIS — I10 Essential (primary) hypertension: Secondary | ICD-10-CM | POA: Diagnosis not present

## 2023-10-21 ENCOUNTER — Encounter: Payer: Self-pay | Admitting: Internal Medicine

## 2023-10-23 NOTE — Telephone Encounter (Signed)
 Hi [place], I have scheduled a virtual visit with Dr Bertrum Brodie for Friday 11/03/23 at 1:15. Please let us  know if this date does not work for you. It was the soonest we had. Have a great day!

## 2023-10-30 ENCOUNTER — Other Ambulatory Visit: Payer: Self-pay | Admitting: Nurse Practitioner

## 2023-10-30 DIAGNOSIS — I2089 Other forms of angina pectoris: Secondary | ICD-10-CM

## 2023-10-30 DIAGNOSIS — R0602 Shortness of breath: Secondary | ICD-10-CM

## 2023-10-30 DIAGNOSIS — I447 Left bundle-branch block, unspecified: Secondary | ICD-10-CM

## 2023-10-30 DIAGNOSIS — I10 Essential (primary) hypertension: Secondary | ICD-10-CM

## 2023-11-02 NOTE — Patient Instructions (Incomplete)
 ICD-10-CM   1. 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 Lung nodules  R91.8     3. Chronic cough  R05.3     4. Former heavy cigarette smoker (20-39 per day)  Z87.891     5. C. difficile diarrhea  A04.72     6. Travel advice encounter  Z71.84       MTB (Mycobacterium tuberculosis infection) Lung nodules Chronic cough  -Significantly improved and also CT scan of the chest November 2024 is hugely improved compared to April 2023 in January 2024.  Glad he finished antituberculous treatment  Plan - Expectant support and monitoring.  Former heavy cigarette smoker (20-39 per day)  -No evidence of lung cancer on CT chest November 2024.  At age 79 you have crossed the threshold for lung cancer screening although per [employer] recommends CT scan till age 6  Plan - We can make a decision on getting another CT scan of the chest in November 2025 at the time of next follow-up.  C. difficile diarrhea - new issue  -Recent C. difficile diarrhea on July 07, 2023 and status post completion of p.o. vancomycin treatment but recurrence of diarrhea today 07/31/2023  Plan - Please call Dr. Chelsea Primus your gastroenterologist  Travel advice encounter to [place] in [place] in May 2025  Plan - Return in March 2025 for pretravel visit   Follow-up - Return in 4 months to see Dr. Marchelle Gearing   -RSI cough score at follow-up

## 2023-11-02 NOTE — Progress Notes (Unsigned)
 OV 08/18/2022   OV 11/09/2021  Subjective:  Patient ID: Elaine Navarro, female , DOB: Jun 29, 1946 , age 78 y.o. , MRN: [id number] , ADDRESS: [address] [place] [address] PCP Ileana Ladd, MD Patient Care Team: Ileana Ladd, MD as PCP - General (Family Medicine)  This Provider for this visit: Treatment Team:  Attending Provider: Kalman Shan, MD    11/09/2021 -   Chief Complaint  Patient presents with   Consult    Pt had a CT performed which is the reason for today's visit.     HPI Elaine Navarro 78 y.o. -presents with her husband.  Concern is left lower lobe nodule 4 mm seen on CT angiogram abdomen done for renal artery aneurysm.  She sees physician at [facility] for renal artery aneurysm.  During this time she had a CT scan of the abdomen done with contrast here in [place].  This showed a 4 mm lung nodule in the left lower lobe.  According to Dr. Dorothey Baseman it is stable over 10 years.  I personally visualized this.  Some bibasilar atelectasis also reported although I am not convinced about this.  Patient herself states that overall she is stable.  She says that in July 2022 she had COVID and after that she had significant cough.  Primary care treated with cough Tessalon Perles.  The cough is now resolved for the last 5 weeks.  Currently no undue shortness of breath.  No cough no wheezing no paroxysmal nocturnal dyspnea no orthopnea.  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  She is a remote 30 pack smoker having quit in 1984 when she moved from [place] to [place].  While in [place] as a child she got exposed to her both grandfather and aunt with tuberculosis.  She says she herself was skin test positive but denies any latent TB treatment.  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  But this was just general air pollution.  She is worried about the implications of all this for her health.  She says clinically her previous primary care physician told her she might have COPD.  She also is a brother with COPD.  She wants to be sure that there is no undue health issues with her lung.  She also wants to continue doing ballroom dancing at a championship level.    CT Chest data 10/21/21   Narrative & Impression  CLINICAL DATA:  Renal artery aneurysm   EXAM: CT ANGIOGRAPHY ABDOMEN   TECHNIQUE: Multidetector CT imaging of the abdomen was performed using the standard protocol during bolus administration of intravenous contrast. Multiplanar reconstructed images and MIPs were obtained and reviewed to evaluate the vascular anatomy.   RADIATION DOSE REDUCTION: This exam was performed according to the departmental dose-optimization program which includes automated exposure control, adjustment of the mA and/or kV according to patient size and/or use of iterative reconstruction technique.   CONTRAST:  75mL OMNIPAQUE IOHEXOL 350 MG/ML SOLN   COMPARISON:  CT abdomen dated August 13, 2012   FINDINGS: VASCULAR   Aorta: Normal caliber abdominal aorta with mild calcified plaque and no significant stenosis.   Celiac: Normal caliber celiac artery with no significant atherosclerotic disease or stenosis.   SMA: Visualized  SMA is normal in caliber with no significant atherosclerotic disease or stenosis.   Renals: Peripherally calcified renal artery aneurysm arising from a lower pole segmental branch measuring 1.1 x 1.1 x 1.3 cm, unchanged in size when compared to prior exam. Bilateral main renal arteries are normal in caliber with minimal calcified plaque and no stenosis.   IMA: Visualized portion is normal in caliber with no evidence of stenosis.   Veins: No venous abnormality.   Review of the MIP images confirms the above  findings.   NON-VASCULAR   Lower chest: Stable solid 4 mm nodule of the left lower lobe located on image 13, likely benign. Bibasilar atelectasis.   Hepatobiliary: No focal liver abnormality is seen. No gallstones, gallbladder wall thickening, or biliary dilatation.   Pancreas: Unremarkable. No pancreatic ductal dilatation or surrounding inflammatory changes.   Spleen: Normal in size without focal abnormality.   Adrenals/Urinary Tract: Adrenal glands are unremarkable. Kidneys are normal, without renal calculi, focal lesion, or hydronephrosis. Bladder is unremarkable.   Stomach/Bowel: Visualized portions of the small and large bowel are unremarkable.   Lymphatic: No pathologically enlarged lymph nodes seen in the abdomen.   Other: No abdominal wall hernia or abdominal ascites.   Musculoskeletal: No acute or significant osseous findings.   IMPRESSION: 1. 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 2.  Aortic Atherosclerosis (ICD10-I70.0).     Electronically Signed   By: Allegra Lai M.D.   On: 10/22/2021 10:03       12/28/2021 Follow up : Lung nodule  Patient returns for a 11-month follow-up.  Patient was seen for pulmonary consult November 09, 2021 for abnormal CT chest that showed 4 mm lung nodule.  Patient also has some mild shortness of breath with activities.  Has a family history of TB and COPD.  She is a former smoker.  Patient was set up for a high-resolution CT chest that showed no enlarged lymph nodes.  A 0.4 cm nodule in the left lower lobe that is stable compared back to 2012.  Consistent with a benign etiology.  Clustered nodularity and consolidation in the right apex with associated parenchymal scarring as well in the left upper lobe.  Findings are consistent with possible atypical infection such as atypical Mycobacterium.  Alpha-1 testing was normal level and phenotype.  Patient says she has very minimum cough if any.  Shortness of breath is  very mild in nature.  Patient was set up for pulmonary function testing that was normal that showed FEV1 at 125%, ratio 80, FVC 117%.,  No significant bronchodilator response, DLCO 82%. She says that she gets bronchitis once or twice a year.  Usually resolves pretty easily.  She says most days she has no cough at all.  Is not limited by activities.  She did have COVID-19 in July 2022. Patient does have colitis and has issues with weight loss at times.  Says occasionally she has some swallow issues.  She denies any hemoptysis, discolored mucus, fever.   IMPRESSION: 1. Small pulmonary nodules of the dependent left lower lobe are stable on examinations dating back to at least 09/15/2010 and definitively benign. No further follow-up or characterization is indicated for these nodules. 2. Clustered centrilobular nodularity and consolidation of the medial right pulmonary apex with some associated pleuroparenchymal scarring, as well as of the peripheral posterior left upper lobe. 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 These findings are new in comparison to remote prior CT of the chest  dated 09/15/2010.   Aortic Atherosclerosis (ICD10-I70.0).     Electronically Signed   By: Jearld Lesch M.D.   On: 12/17/2021 13:00     06/30/2022 Patient presents today for acute office visit. She has a history of pulmonary nodules.  She had a CT in April 2023 that showed findings atypical mycobacterium. She has had a cough since August.  Primary care prescribed rifampin, ethambutol and azithromycin recently d/t her symptoms and image findings. She developed white tongue and sore throat after taking medication for 3 days. She did notice improvement in cough while on abx. She has had no sputum cultures. She has never had a bronchoscopy. No recent chest imaging. She plans to travel to [place] on 07/09/22. No significant shortness of breath except with coughing fits.  Cough can be productive with white-yellow phlegm. She is afebrile.    Subjective:  Patient ID: Elaine Navarro, female , DOB: [birth year] , age 66 y.o. , MRN: [id number] , ADDRESS: [address] [place] [address] PCP Thana Ates, MD Patient Care Team: Thana Ates, MD as PCP - General (Internal Medicine)  This Provider for this visit: Treatment Team:  Attending Provider: Kalman Shan, MD    08/18/2022 -   Chief Complaint  Patient presents with   Follow-up    Pt states she is still coughing and also has chest congestion as well as tightness in chest. States her cough is worse in the evening and states she is coughing up white phlegm.     HPI Elaine Navarro 78 y.o. -returns for follow-up.  Presents with her husband.  I saw her earlier in the year in 2023.  After that she has had visits with the nurse practitioner.  She tells me that the cough that started in the summer 2022 after COVID still persist.  After I saw her I ordered CT scan of the chest that showed nodularity that was clustered.  Her primary care physician empirically prescribed a triple antibiotic therapy for MAI infection.  However she developed a coated tongue.  Also given the empiric nature of the diagnosis we recommended she stop it.  Which she did.  After that in October 2023 she saw a Publishing rights manager.  Sputum culture grew haemophilus influenza and Candida.  She was given Z-Pak.  She did leave for her trip to [place] on 07/09/2022 but the trip got changed because of the [place] war.  Instead she went to [place] [employer] [employer] and from there it was a [place] cruise from 07/11/2022.  She travel to the [place] of [place] in [place].  She went to [place] she went to [place] and she came back to [place] via [place] on 07/24/2022.  Shortly after arriving she got COVID-19 for the second time.  She believes she contracted this in the flight.  During the trip her cough is significantly  worse rated at 8 out of 9.  The Z-Pak that she was prescribed she took it at that time and it did help but currently she is back with residual cough at the level 5 out of 10.  Chest feels tight.  There is occasional white stuff.  Particularly sputum.  She is quite miserable with the symptoms.  She also has a runny nose for the last 5 weeks.  She wants her symptoms addressed.  Labs show normal QuantiFERON gold but no allergy workup. Alpha-1 antitrypsin is normal. She is a former 30 pack smoking history.   Of note for  this visit I personally visualized the CT scan with the previous CT scan from April 2023.     OV 10/11/2022  Subjective:  Patient ID: Elaine Navarro, female , DOB: Oct 21, 1945 , age 41 y.o. , MRN: [id number] , ADDRESS: [address] [place] [facility] [address] PCP Thana Ates, MD Patient Care Team: Thana Ates, MD as PCP - General (Internal Medicine)  This Provider for this visit: Treatment Team:  Attending Provider: Kalman Shan, MD    10/11/2022 -   Chief Complaint  Patient presents with   Follow-up    Cough is improving. She has not started her spiriva due to high cost. Her cough is occ prod with white sputum. Appetite is good. No fevers, sweats.     HPI Elaine Navarro 78 y.o. -returns for follow-up.  At this point in time under the coordination of Department of Public Health she is completed 2 weeks of Mycobacterium tuberculosis treatment and date labs of therapy.  She already states that her cough is improved significantly her chest tightness is improved significantly although residual symptoms are still there.  She is feeling better she is tolerating the antituberculous treatment well.  She is no longer considered contagious and has been allowed to go in public according to her history.  Her husband is with her but he is not providing any independent history today.  The test sputum culture was done by nurse practitioner in October 2023.  Right  prior to starting the antituberculous treatment we did a CT scan of the chest and a pulmonary right upper lobe nodules were actually getting worse.  A bronchoscopy was done early January 2024 and the nucleocapsid amplification of Mycobacterium tuberculosis negative but the culture is still pending and so far no growth.  Nevertheless she is feeling better.  Of note she had some mild emphysema on the CT scan we ordered Spiriva but it is too expensive and she is feeling better so we told her to hold off.   Her current response to typical treatment and terms of cough is profile below     CT cest 09/21/22   Narrative & Impression  CLINICAL DATA:  78 year old female presents for evaluation of chronic productive cough.   EXAM: CT CHEST WITHOUT CONTRAST   TECHNIQUE: Multidetector CT imaging of the chest was performed following the standard protocol without IV contrast.   RADIATION DOSE REDUCTION: This exam was performed according to the departmental dose-optimization program which includes automated exposure control, adjustment of the mA and/or kV according to patient size and/or use of iterative reconstruction technique.   COMPARISON:  December 17, 2021   FINDINGS: Cardiovascular: Calcified aortic atherosclerosis is mild. Normal caliber of the thoracic aorta. Normal heart size. Scattered coronary artery calcifications. Normal caliber of central pulmonary vasculature.   Mediastinum/Nodes: LEFT thyroid nodule up to 2.3 cm with areas of calcification. No adenopathy in the chest. Esophagus is grossly normal by CT.   Lungs/Pleura: Biapical scarring. More confluent area of nodularity in the RIGHT upper lobe has increased considerably since previous imaging 3.0 x 1.3 cm. Previously this was confined to an area along the medial and lateral surface of the apical pleural surfaces.   There are diffuse areas of nodularity in the RIGHT upper lobe metabolic so increased, some areas new since  previous imaging. Juxtapleural nodule in the anterior RIGHT upper lobe (image 36/5) 8 mm, this is a new finding. Grouped nodules in the RIGHT upper lobe and some tree in bud on  images 36 through 41 are also new.   Some improvement with respect to bronchovascular nodularity extending towards the RIGHT lung apex that was seen in the medial RIGHT upper lobe on the previous study. No signs of additional consolidative process or evidence of pleural effusion. Mild LEFT apical scarring.   Stable LEFT lower lobe nodule (image 132/5) 4 mm. Airways are patent. No consolidation or sign of pleural effusion.   Upper Abdomen: No acute upper abdominal findings to the extent evaluated.   Musculoskeletal: No chest wall mass.  Spinal degenerative changes.   IMPRESSION: 1. Resolution of some areas of nodularity with worsening of peripheral areas of nodularity in the RIGHT upper lobe. Findings favor sequela of infection perhaps MAI or atypical infection. 2. More confluent area of nodularity also having developed since previous imaging. Would suggest short interval follow-up to ensure that this area shows improvement and does not show persistent underlying masslike area or nodule. Would also consider correlation with respiratory symptoms and pulmonology consultation if not yet obtained. 3. LEFT thyroid nodule up to 2.3 cm with areas of calcification. Suggest follow-up thyroid ultrasound if not yet obtained. 4. Aortic atherosclerosis and coronary artery disease.   Aortic Atherosclerosis (ICD10-I70.0).     Electronically Signed   By: Donzetta Kohut M.D.   On: 09/22/2022 14:09     OV 01/17/2023  Subjective:  Patient ID: Elaine Navarro, female , DOB: [birth year] , age 4 y.o. , MRN: [id number] , ADDRESS: [address] [place] [address] PCP Thana Ates, MD Patient Care Team: Thana Ates, MD as PCP - General (Internal Medicine)  This Provider for this visit: Treatment Team:   Attending Provider: Kalman Shan, MD  #Former heavy smoker 30 pack - #Right upper lobe nodule April 2023 worse in January 2024  -Sputum culture + October 2023 for Mycobacterium tuberculosis and also haemophilus  -Multiple QuantiFERON gold negative  -exposed to tuberculosis as an infant from her grandfather in the 1940s  01/17/2023 -   Chief Complaint  Patient presents with   Follow-up    CT F/up     HPI Elaine Navarro 78 y.o. -returns for follow-up.  Presents with her husband was an independent historian.  She now reports that her cough is even better with continued antituberculous treatment but beginning December 20, 2022 while on antituberculous treatment with close monitoring by the Department of Public Health she started having transaminitis.  Her AST went up to 84 and ALT went up to 108.  Then on Jan 13, 2023 AST was 114 and ALT was 144 and then on Jan 15, 2023 the antituberculous treatment was stopped.  She is not having any jaundice or discolored urine.  She has gained a little weight.  She says the cough is actually better.  She says she is getting good support from the [employer].  However she is worried about drug-induced liver injury.  Husband states she is really worried about this.  She is reading things on the Internet.  She says the public health department might switch her to another antituberculous treatment.  They are also requesting a right upper quadrant ultrasound.  She had CT scan of the chest May 2025 personally visualized it.  I am not fully sure if she is significantly improved or not compared to a year ago.  I asked for repeat read.  I did show her the CT report.  I also showed her the CT images.  In addition she did  have some vaginal yeast infection a month ago and took Diflucan pessary but now the vaginal itch is back.  Started having some pain in the right low back for the last 1 week.  She is got some dry mouth for the last 1 month.  She will  discuss this with primary care physician.       CT chest 01/13/23  Narrative & Impression  CLINICAL DATA:  Chronic cough, former smoker   EXAM: CT CHEST WITHOUT CONTRAST   TECHNIQUE: Multidetector CT imaging of the chest was performed following the standard protocol without IV contrast.   RADIATION DOSE REDUCTION: This exam was performed according to the departmental dose-optimization program which includes automated exposure control, adjustment of the mA and/or kV according to patient size and/or use of iterative reconstruction technique.   COMPARISON:  CT chest, 09/21/2022 thyroid ultrasound, 05/12/2022   FINDINGS: Cardiovascular: Aortic atherosclerosis. Normal heart size. Left and right coronary artery calcifications. No pericardial effusion.   Mediastinum/Nodes: No enlarged mediastinal, hilar, or axillary lymph nodes. Thyroid gland, trachea, and esophagus demonstrate no significant findings.   Lungs/Pleura: No significant change in an area of consolidation and volume loss of the right pulmonary apex (series 8, image 5). Adjacent clustered nodularity and small consolidations of overall improved, however are fluctuant, with a new nodule medially measuring 0.5 cm (series 8, image 22). Additional, occasional small nodules and ground-glass opacities elsewhere, for example a 0.4 cm nodule of the dependent left lower lobe, are unchanged (series 8, image 124). No pleural effusion or pneumothorax.   Upper Abdomen: No acute abnormality. Densely rim calcified aneurysm of the right renal artery or a branch arteriole measuring 1.3 x 1.2 cm (series 2, image 156).   Musculoskeletal: No chest wall abnormality. No acute osseous findings.   IMPRESSION: 1. No significant change in an area of consolidation and volume loss of the right pulmonary apex. Adjacent clustered nodularity and small consolidations of overall improved, however fluctuant. Findings are consistent with ongoing  atypical infection and chronic underlying sequelae, particularly atypical Mycobacterium. 2. Additional, occasional small nodules and ground-glass opacities elsewhere unchanged. 3. Incidental note of a densely rim calcified aneurysm of the right renal artery or a branch arteriole measuring 1.3 x 1.2 cm. 4. Coronary artery disease.   Aortic Atherosclerosis (ICD10-I70.0).     Electronically Signed   By: Jearld Lesch M.D.   On: 01/17/2023 12:18    OV 05/02/2023  Subjective:  Patient ID: Elaine Navarro, female , DOB: 30-Jun-1946 , age 23 y.o. , MRN: [id number] , ADDRESS: [address] [place] [address] PCP Thana Ates, MD Patient Care Team: Thana Ates, MD as PCP - General (Internal Medicine) Pricilla Riffle, MD as PCP - Cardiology (Cardiology)  This Provider for this visit: Treatment Team:  Attending Provider: Kalman Shan, MD    05/02/2023 -   Chief Complaint  Patient presents with   Follow-up    F/up on TB exposure     HPI Elaine Navarro 78 y.o. -followup MTB/ Discussed in cased conference June 2024: Jan 13, 2023 and Sep 21, 2022 -> [place] and waingin CT but there are areas of improvement but also new areas of nodulairty.Overall slightly better.  Has also seen ID Dr Rutha Bouchard - he supports Dx of MTB/ Then 04/10/23 saw cardiology: reported improvement in DOE. Co Caslcium was 42nd percentile.  She presents now with her husband for follow-up.  She says her cough is completely resolved.  She is feeling great.  Her weight is good.  No shortness of breath no wheezing.  She completed direct observed therapy by the public health department on Friday, 04/28/2023.  She said they did a chest x-ray was clear.  She wants to have a CT scan of the chest.  We discussed that our case conference and I shared these findings with her.  She and her husband wanted know about travel history.  I did tell them she is free to travel.  They are going to go to [place] in [place] in May  2025 for their 60th wedding anniversary.  She also was asking about travel advice particularly masking around planes.  We went over this.  I did advise her to mask in airports and planes and in any indoor cluster.  Did advise her to be up-to-date with all her vaccines.  Did indicate that I will give her prednisone Paxlovid and antibiotic for travel to [place].  We went over her pneumonia vaccination history.  She is opted to take the Prevnar 20 today.  She will have flu shot and COVID shot on her own this fall 2024.     reatment started 09/21/2022 ([facility]) Has been on consolidation with RI Lft rising to 150s and tx hold 5/06   5/02 cbc 4.7/14/237; lft 114/144/94/0.4 (ast/alt/alkphos/tbili) 5/14 lft 90/123/87/0.5    OV 07/31/2023  Subjective:  Patient ID: Elaine Navarro, female , DOB: Jan 19, 1946 , age 24 y.o. , MRN: [id number] , ADDRESS: [address] [place] [address] PCP Thana Ates, MD Patient Care Team: Thana Ates, MD as PCP - General (Internal Medicine) Pricilla Riffle, MD as PCP - Cardiology (Cardiology)  This Provider for this visit: Treatment Team:  Attending Provider: Kalman Shan, MD    07/31/2023 -   Chief Complaint  Patient presents with   Follow-up    Ct f/u 11/1, pt states she has questions      HPI Elaine Navarro 78 y.o. -returns for follow-up.  Presents with her husband.  History is provided mostly by her but also review of the external medical record and to some extent the husband is an independent historian.After the last visit she was doing well.  However after coming of antituberculous treatment in August 2024 she felt she had a recurrence of her GI issues.  She is complaining of cramps particular in the suprapubic area along with diarrhea and bloating and abdominal pain.  She was evaluated by [facility]ie [facility].  I reviewed his notes.  Initially seen 06/19/2023.  By July 07, 2023 C. difficile antigen was  positive.  She took p.o. vancomycin.  She showed me those results.  Diarrhea got better she took p.o. vancomycin for 10 days.  But today the diarrhea is back and the cramps are beginning to come back.  She is worried the C. difficile is back.  She plans to call Dr. Zara Council today.  From  From a respiratory standpoint she is doing well.  She has very mild cough.  She is completed antituberculous treatment.  Weight is holding.  Cough score as below.  She had CT scan of the chest November 2024 and I compared the nodules compared to January 2024 and also April 2023.  In my personal visualization opinion these nodules are significantly improved.  There are some residual bronchiectasis.  I showed this to her.  Radiology feels it is stable but I feel it is improved.  She has travel coming up to [place]  and [place] for her Gerrie Nordmann wedding anniversary in May 2025.  She and I agreed for her to do a pretravel visit in March 2025.        CT Chest data from date: yes  - personally visualized and independently interpreted : yes - my findings are: improved nodules arrative & Impression  CLINICAL DATA:  78 year old female with history of pulmonary nodules. History of mycobacterium tuberculosis exposure.   EXAM: CT CHEST WITHOUT CONTRAST   TECHNIQUE: Multidetector CT imaging of the chest was performed following the standard protocol without IV contrast.   RADIATION DOSE REDUCTION: This exam was performed according to the departmental dose-optimization program which includes automated exposure control, adjustment of the mA and/or kV according to patient size and/or use of iterative reconstruction technique.   COMPARISON:  Cardiac CT 03/02/2023.  Chest CT 01/13/2023.   FINDINGS: Cardiovascular: Heart size is normal. There is no significant pericardial fluid, thickening or pericardial calcification. There is aortic atherosclerosis, as well as atherosclerosis of the great vessels of the mediastinum and the  coronary arteries, including calcified atherosclerotic plaque in the left anterior descending and right coronary arteries.   Mediastinum/Nodes: No pathologically enlarged mediastinal or hilar lymph nodes. Please note that accurate exclusion of hilar adenopathy is limited on noncontrast CT scans. Esophagus is unremarkable in appearance. No axillary lymphadenopathy.   Lungs/Pleura: Pleural-parenchymal thickening and areas of architectural distortion and nodularity again noted, most evident in the lung apices, very similar to the prior study. A few other scattered areas of mild cylindrical bronchiectasis and peripheral micro nodularity are noted elsewhere in the lungs, also similar to the prior study, most evident throughout the upper lobes of the lungs, generally measuring 4 mm or less in size. No definite new suspicious appearing pulmonary nodules or masses are noted. No acute consolidative airspace disease. No pleural effusions.   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 Atherosclerotic calcifications in the abdominal aorta.   Musculoskeletal: There are no aggressive appearing lytic or blastic lesions noted in the visualized portions of the skeleton.   IMPRESSION: 1. 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 No new or acute findings are noted. 2. Aortic atherosclerosis, in addition to two-vessel coronary artery disease. Please note that although the presence of coronary artery calcium documents the presence of coronary artery disease, the severity of this disease and any potential stenosis cannot be assessed on this non-gated CT examination. Assessment for potential risk factor modification, dietary therapy or pharmacologic  therapy may be warranted, if clinically indicated. 3. 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 This is unchanged. Follow-up nonemergent CTA of the abdomen is suggested in the near future to provide definitive characterization and establish a baseline for future follow-up examinations.   Aortic Atherosclerosis (ICD10-I70.0).     Electronically Signed   By: Trudie Reed M.D.   On: 07/28/2023 08:54      OV 11/03/2023  Subjective:  Patient ID: Elaine Navarro, female , DOB: Nov 24, 1945 , age 64 y.o. , MRN: [id number] , ADDRESS: [address] [place] [address] PCP Thana Ates, MD Patient Care Team: Thana Ates, MD as PCP - General (Internal Medicine) Pricilla Riffle, MD as PCP - Cardiology (Cardiology)  This Provider for  this visit: Treatment Team:  Attending Provider: Kalman Shan, MD  Type of visit: Video Virtual Visit Identification of patient Elaine Navarro with 1946-04-15 and MRN [id number] - 2 person identifier Risks: Risks, benefits, limitations of telephone visit explained. Patient understood and verbalized agreement to proceed Anyone else on call: just him + husband on side Patient location: her home This provider location: [address]; [place]; [place] [address]. [facility]. 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 - C Ciff resolved but having colitis an taking budesonide and is on 90 day Rx. GOing to finsih. Having some cough. Very mild mucus only. Clear mucuh. Cough severity is 1-2 of 10 only. In May 2025: going to [place] and [place] -> will make a visit to get travel advice from pulmonary perspective.  OF note, she is going to start PROLIA for OP - conc\ern is infection risk esp pumonary. 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   D/w Chesley Mires our pharmacist - slight general increase infection risk but if no active infection no contraindication . Showed her case control data and also [employer].     Dr Gretta Cool Reflux Symptom Index (> 13-15 suggestive of LPR cough) Pre RX  - ATb Rx Post start ATbx Rx - 2 weeks 07/31/2023 Completed ATT by Dept of Public health  Hoarseness of problem with voice 5 2 0  Clearing  Of Throat 5 3 0  Excess throat mucus or feeling of post nasal drip 5 3 1   Difficulty swallowing food, liquid or tablets 3 2 0  Cough after eating or lying down 5 3 0  Breathing difficulties or choking episodes 4 2 0  Troublesome or annoying cough 5 3 0  Sensation of something sticking in throat or lump in throat 5 2 0  Heartburn, chest pain, indigestion, or stomach acid coming up 3 2 0  TOTAL 40 21 0     Cultures: 1/02 afb sputum -- few mtb (S inh mic 0.1, rifampin mic 1, ethambutol mic 5; pza not done as not available currently) 2/14 sputum cx -- few mtb 4/03 sputum cx -- ngtd 4/04 sputum cx -- ngtd 4/17 sputum cx -- ngtd 4/18 sputum cx -- ngtd     PFT     Latest Ref Rng & Units 12/28/2021   10:42 AM  PFT Results  FVC-Pre L 3.18   FVC-Predicted Pre % 121   FVC-Post L 3.07   FVC-Predicted Post % 117   Pre FEV1/FVC % % 72   Post FEV1/FCV % % 80   FEV1-Pre L 2.29   FEV1-Predicted Pre % 117   FEV1-Post L 2.44   DLCO uncorrected ml/min/mmHg 15.17   DLCO UNC% % 82   DLCO corrected ml/min/mmHg 15.17   DLCO COR %Predicted % 82   DLVA Predicted % 72   TLC L 4.40   TLC % Predicted % 89   RV % Predicted % 64        LAB RESULTS last 96 hours No results found.       has a past medical history of Aneurysm of renal artery (HCC), Fracture of right wrist (2011), GERD (gastroesophageal reflux disease), Hypertension, Left bundle branch block, Left thyroid nodule (2003), Lymphocytic colitis (03/2013), Migraines, Osteoporosis, PONV (postoperative nausea and vomiting),  Sciatica, and Visceral injury.   reports  that she quit smoking about 40 years ago. Her smoking use included cigarettes. She started smoking about 50 years ago. She has a 30 pack-year smoking history. She has never used smokeless tobacco.  Past Surgical History:  Procedure Laterality Date   ABDOMINOPLASTY  2010   APPENDECTOMY  1962   BREAST EXCISIONAL BIOPSY Bilateral 2001   x 3   BRONCHIAL WASHINGS  09/16/2022   Procedure: BRONCHIAL WASHINGS;  Surgeon: Kalman Shan, MD;  Location: [facility] ENDOSCOPY;  Service: Endoscopy;;   CATARACT EXTRACTION, BILATERAL Bilateral 01/2020   Dental implants  1993   HEMORRHOID SURGERY  2000   JOINT REPLACEMENT     KNEE ARTHROSCOPY W/ DEBRIDEMENT Right 07/2020   Dr. Luiz Blare   TOTAL KNEE ARTHROPLASTY Right 01/21/2022   Procedure: RIGHT TOTAL KNEE ARTHROPLASTY;  Surgeon: Kathryne Hitch, MD;  Location: [facility] ORS;  Service: Orthopedics;  Laterality: Right;  RFNA   VAGINAL HYSTERECTOMY  1974   Dysfunctional uterine bleeding.   VIDEO BRONCHOSCOPY N/A 09/16/2022   Procedure: VIDEO BRONCHOSCOPY WITHOUT FLUORO;  Surgeon: Kalman Shan, MD;  Location: [facility] END[facility]COPY;  Service: Endoscopy;  Laterality: N/A;    Allergies  Allergen Reactions   Caine-1 [Lidocaine] Swelling    Allergic to Septocaine Articaine HCL 4% w/epi   Indomethacin Swelling   Other Diarrhea    Dairy Products    Articaine-Epinephrine Other (See Comments)   Gabapentin     "Twitches"   Hydrocodone Nausea Only   Latex Swelling    Possible allergy   Lamisil [Terbinafine] Rash    Immunization History  Administered Date(s) Administered   Fluad Quad(high Dose 65+) 05/29/2022, 05/14/2023   Hepatitis A 03/10/2003, 10/15/2003   Hepatitis B 04/15/2003, 05/21/2003, 10/15/2003   Influenza Split 06/09/2014, 06/08/2015, 06/08/2017, 06/19/2018, 05/27/2019, 05/18/2020, 05/31/2021   Influenza, High Dose Seasonal PF 06/19/2018   Influenza,inj,Quad PF,6+ Mos 06/13/2011   Influenza-Unspecified  05/16/2013   PFIZER(Purple Top)SARS-COV-2 Vaccination 10/01/2019, 10/21/2019, 05/07/2020, 12/15/2020, 05/31/2021, 06/01/2022   Pneumococcal Conjugate-13 10/18/2013   Pneumococcal Polysaccharide-23 06/22/2010   Respiratory Syncytial Virus Vaccine,Recomb Aduvanted(Arexvy) 05/11/2022   Td 03/10/2003   Tdap 11/08/2010, 12/29/2019, 01/11/2020   Typhoid Inactivated 03/10/2003, 07/05/2013   Yellow Fever 07/05/2013   Zoster, Live 01/19/2006, 12/21/2016, 06/26/2017    Family History  Problem Relation Age of Onset   Hypertension Mother    Cancer Sister        melanoma   Autoimmune disease Sister        crest and sjor   Lupus Brother      Current Outpatient Medications:    albuterol (VENTOLIN HFA) 108 (90 Base) MCG/ACT inhaler, Inhale 2 puffs into the lungs every 6 (six) hours as needed for wheezing or shortness of breath., Disp: 8 g, Rfl: 2   amLODipine (NORVASC) 10 MG tablet, Take 10 mg by mouth daily., Disp: , Rfl:    ascorbic acid (VITAMIN C) 500 MG tablet, Take 500 mg by mouth daily., Disp: , Rfl:    atorvastatin (LIPITOR) 10 MG tablet, Take 0.5 tablets (5 mg total) by mouth daily., Disp: 45 tablet, Rfl: 3   botulinum toxin Type A (BOTOX) 100 units SOLR injection, Inject into the skin., Disp: , Rfl:    budesonide (ENTOCORT EC) 3 MG 24 hr capsule, Take 3 mg by mouth daily as needed (colitis flare)., Disp: , Rfl:    buPROPion (WELLBUTRIN XL) 150 MG 24 hr tablet, TAKE 1 TABLET(150 MG) BY MOUTH DAILY, Disp: 90 tablet, Rfl: 1   chlorhexidine (PERIDEX) 0.12 % solution, SMARTSIG:By Mouth, Disp: ,  Rfl:    Cholecalciferol (VITAMIN D) 50 MCG (2000 UT) tablet, Take 2,000 Units by mouth daily., Disp: , Rfl:    clobetasol cream (TEMOVATE) 0.05 %, APPLY TOPICALLY TO THE AFFECTED AREA TWICE DAILY. STOP WHEN SMOOTH. AVOID FACE AND BODY FOLDS, Disp: , Rfl:    ferrous sulfate 325 (65 FE) MG EC tablet, Take 325 mg by mouth every Monday., Disp: , Rfl:    finasteride (PROSCAR) 5 MG tablet, Take 5 mg by mouth  daily., Disp: , Rfl:    isosorbide mononitrate (IMDUR) 30 MG 24 hr tablet, Take 1 tablet (30 mg total) by mouth daily., Disp: 90 tablet, Rfl: 3   L-CYSTEINE PO, Take 500 mg by mouth daily., Disp: , Rfl:    Melatonin 5 MG TABS, Take 5 mg by mouth at bedtime as needed (sleep)., Disp: , Rfl:    Menaquinone-7 (VITAMIN K2) 100 MCG CAPS, Take 100 mcg by mouth daily., Disp: , Rfl:    Multiple Vitamin (MULTIVITAMIN) tablet, Take 1 tablet by mouth daily., Disp: , Rfl:    omeprazole (PRILOSEC) 20 MG capsule, Take 20 mg by mouth 2 (two) times daily before a meal., Disp: , Rfl:    Potassium 99 MG TABS, Take 99 mg by mouth daily., Disp: , Rfl:    Povidone, PF, (IVIZIA DRY EYES) 0.5 % SOLN, Place 1 drop into both eyes at bedtime., Disp: , Rfl:    SUMAtriptan (IMITREX) 50 MG tablet, Take 50 mg by mouth every 2 (two) hours as needed for migraine., Disp: , Rfl:    terconazole (TERAZOL 7) 0.4 % vaginal cream, Place 1 applicator vaginally at bedtime., Disp: 45 g, Rfl: 0   traMADol (ULTRAM) 50 MG tablet, Take 50 mg by mouth 4 (four) times daily as needed., Disp: , Rfl:    triamcinolone ointment (KENALOG) 0.1 %, Apply to left nare daily as needed., Disp: , Rfl:    valsartan (DIOVAN) 320 MG tablet, TAKE 1 TABLET(320 MG) BY MOUTH DAILY, Disp: 90 tablet, Rfl: 1   vitamin B-12 (CYANOCOBALAMIN) 500 MCG tablet, Take 500 mcg by mouth every other day., Disp: , Rfl:       Objective:   There were no vitals filed for this visit.  Estimated body mass index is 19.41 kg/m as calculated from the following:   Height as of 07/31/23: 5\' 3"  (1.6 m).   Weight as of 07/31/23: 109 lb 9.6 oz (49.7 kg).  @WEIGHTCHANGE @  There were no vitals filed for this visit.   Physical Exam   General: No distress. pleasant O2 at rest: no Cane present: no Sitting in wheel chair: no Frail: no Obese: no Neuro: Alert and Oriented x 3. GCS 15. Speech normal Psych: Pleasant      Assessment:       ICD-10-CM   1. Encounter for  medication counseling  Z71.89          Plan:     Patient Instructions     ICD-10-CM   1. 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 Lung nodules  R91.8     3. Chronic cough  R05.3     4. Former heavy cigarette smoker (20-39 per day)  Z87.891     5. C. difficile diarrhea  A04.72     6. Travel advice encounter  [phone]       Encounter for medication counseling - Prolia  Plan  - Benefit outweighs pulmonary risk from Prolia and so okay to take Prolia  MTB (Mycobacterium  tuberculosis infection) Lung nodules Chronic cough  -Significantly improved and also CT scan of the chest November 2024 is hugely improved compared to April 2023 in January 2024.  Glad he finished antituberculous treatment  Plan - Expectant support and monitoring.  Former heavy cigarette smoker (20-39 per day)  -No evidence of lung cancer on CT chest November 2024.  At age 1 you have crossed the threshold for lung cancer screening although per American Cancer Society recommends CT scan till age 56  Plan - We can make a decision on getting another CT scan of the chest in November 2025 at the time of next follow-up.  C. difficile diarrhea - new issue  -Recent C. difficile diarrhea on July 07, 2023 and status post completion of p.o. vancomycin treatment but recurrence of diarrhea today 07/31/2023  Plan - Please call Dr. Chelsea Primus your gastroenterologist  Travel advice encounter to [place] in [place] in May 2025  Plan - Return in March 2025 for pretravel visit   Follow-up - Return in 4 months to see Dr. Marchelle Gearing   -RSI cough score at follow-up   FOLLOWUP No follow-ups on file.    SIGNATURE    Dr. Kalman Shan, M.D., F.C.C.P,  Pulmonary and Critical Care Medicine Staff Physician, [facility] Director - Interstitial Lung Disease  Program  Pulmonary Fibrosis [facility] at [facility] [place], [place], [address]  Pager: [phone], If no answer or between  15:00h - 7:00h: call [phone] Telephone: [phone]  1:33 PM 11/03/2023

## 2023-11-03 ENCOUNTER — Encounter: Payer: Self-pay | Admitting: Internal Medicine

## 2023-11-03 ENCOUNTER — Telehealth: Payer: Medicare Other | Admitting: Internal Medicine

## 2023-11-03 DIAGNOSIS — R918 Other nonspecific abnormal finding of lung field: Secondary | ICD-10-CM | POA: Diagnosis not present

## 2023-11-03 DIAGNOSIS — A0472 Enterocolitis due to Clostridium difficile, not specified as recurrent: Secondary | ICD-10-CM | POA: Diagnosis not present

## 2023-11-03 DIAGNOSIS — Z7189 Other specified counseling: Secondary | ICD-10-CM

## 2023-11-03 DIAGNOSIS — A159 Respiratory tuberculosis unspecified: Secondary | ICD-10-CM | POA: Diagnosis not present

## 2023-11-20 ENCOUNTER — Ambulatory Visit

## 2023-11-20 ENCOUNTER — Encounter: Payer: Self-pay | Admitting: Internal Medicine

## 2023-11-20 ENCOUNTER — Ambulatory Visit: Payer: Medicare Other | Admitting: Internal Medicine

## 2023-11-20 VITALS — BP 122/72 | HR 77 | Ht 63.0 in | Wt 107.0 lb

## 2023-11-20 DIAGNOSIS — A159 Respiratory tuberculosis unspecified: Secondary | ICD-10-CM

## 2023-11-20 DIAGNOSIS — Z7184 Encounter for health counseling related to travel: Secondary | ICD-10-CM | POA: Diagnosis not present

## 2023-11-20 DIAGNOSIS — R918 Other nonspecific abnormal finding of lung field: Secondary | ICD-10-CM

## 2023-11-20 DIAGNOSIS — R059 Cough, unspecified: Secondary | ICD-10-CM | POA: Diagnosis not present

## 2023-11-20 DIAGNOSIS — Z87891 Personal history of nicotine dependence: Secondary | ICD-10-CM

## 2023-11-20 DIAGNOSIS — R053 Chronic cough: Secondary | ICD-10-CM | POA: Diagnosis not present

## 2023-11-20 LAB — CBC WITH DIFFERENTIAL/PLATELET
Basophils Absolute: 0.1 10*3/uL (ref 0.0–0.1)
Basophils Relative: 1.2 % (ref 0.0–3.0)
Eosinophils Absolute: 0 10*3/uL (ref 0.0–0.7)
Eosinophils Relative: 0.8 % (ref 0.0–5.0)
HCT: 44.4 % (ref 36.0–46.0)
Hemoglobin: 15 g/dL (ref 12.0–15.0)
Lymphocytes Relative: 19.9 % (ref 12.0–46.0)
Lymphs Abs: 1.2 10*3/uL (ref 0.7–4.0)
MCHC: 33.9 g/dL (ref 30.0–36.0)
MCV: 97.2 fl (ref 78.0–100.0)
Monocytes Absolute: 0.5 10*3/uL (ref 0.1–1.0)
Monocytes Relative: 7.5 % (ref 3.0–12.0)
Neutro Abs: 4.4 10*3/uL (ref 1.4–7.7)
Neutrophils Relative %: 70.6 % (ref 43.0–77.0)
Platelets: 231 10*3/uL (ref 150.0–400.0)
RBC: 4.57 Mil/uL (ref 3.87–5.11)
RDW: 12.8 % (ref 11.5–15.5)
WBC: 6.2 10*3/uL (ref 4.0–10.5)

## 2023-11-20 LAB — COMPREHENSIVE METABOLIC PANEL
ALT: 31 U/L (ref 0–35)
AST: 33 U/L (ref 0–37)
Albumin: 4.9 g/dL (ref 3.5–5.2)
Alkaline Phosphatase: 68 U/L (ref 39–117)
BUN: 14 mg/dL (ref 6–23)
CO2: 29 meq/L (ref 19–32)
Calcium: 10 mg/dL (ref 8.4–10.5)
Chloride: 100 meq/L (ref 96–112)
Creatinine, Ser: 0.72 mg/dL (ref 0.40–1.20)
GFR: 79.66 mL/min (ref >60.00)
Glucose, Bld: 91 mg/dL (ref 70–99)
Potassium: 3.8 meq/L (ref 3.5–5.1)
Sodium: 138 meq/L (ref 135–145)
Total Bilirubin: 0.7 mg/dL (ref 0.2–1.2)
Total Protein: 7.2 g/dL (ref 6.0–8.3)

## 2023-11-20 MED ORDER — PREDNISONE 10 MG PO TABS: ORAL_TABLET | ORAL | 0 refills | Status: DC

## 2023-11-20 MED ORDER — AZITHROMYCIN 250 MG PO TABS: ORAL_TABLET | ORAL | 0 refills | Status: DC

## 2023-11-20 MED ORDER — CEPHALEXIN 500 MG PO CAPS: 500.0000 mg | ORAL_CAPSULE | Freq: Three times a day (TID) | ORAL | 0 refills | Status: DC

## 2023-11-20 MED ORDER — PAXLOVID (300/100) 20 X 150 MG & 10 X 100MG PO TBPK: 3.0000 | ORAL_TABLET | Freq: Two times a day (BID) | ORAL | 0 refills | Status: AC

## 2023-11-20 NOTE — Patient Instructions (Addendum)
 ICD-10-CM   1. 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 Lung nodules  R91.8     3. Chronic cough  R05.3     4. Former heavy cigarette smoker (20-39 per day)  Z87.891     5. C. difficile diarrhea  A04.72     6. Travel advice encounter  Z71.84        MTB (Mycobacterium tuberculosis infection) Lung nodules Chronic cough Former heavy cigarette smoker (20-39 per day)  -Significantly improved and also CT scan of the chest November 2024 is hugely improved compared to April 2023 in January 2024.  Glad you finished antituberculous treatment but it appears the cough is back and we will need to understand why.  Plan - CXR 2 viuew 11/20/2023 - cbc with diff 11/20/2023 - repeat CT chest November 2025.  - use albuterol as needed   Travel advice encounter to [place] in [place] in May 2025  Plan - cbc with diff, bmet, lft 11/20/2023 pre-travel -Paxlovid for travel - Z Pak for travel - Cephalexin 500mg  tid x 5 days for travel  - Please take prednisone 40 mg x1 day, then 30 mg x1 day, then 20 mg x1 day, then 10 mg x1 day, and then 5 mg x1 day and stop    Follow-up - Return in NOv 2025 to see DR Marchelle Gearing but after ct chest

## 2023-11-20 NOTE — Progress Notes (Signed)
 OV 08/18/2022   OV 11/09/2021  Subjective:  Patient ID: Elaine Navarro, female , DOB: 10-25-1945 , age 78 y.o. , MRN: [id number] , ADDRESS: [address] [place] [address] PCP Ileana Ladd, MD Patient Care Team: Ileana Ladd, MD as PCP - General (Family Medicine)  This Provider for this visit: Treatment Team:  Attending Provider: Kalman Shan, MD    11/09/2021 -   Chief Complaint  Patient presents with   Consult    Pt had a CT performed which is the reason for today's visit.     HPI Turkey K Navarro 78 y.o. -presents with her husband.  Concern is left lower lobe nodule 4 mm seen on CT angiogram abdomen done for renal artery aneurysm.  She sees physician at [facility] for renal artery aneurysm.  During this time she had a CT scan of the abdomen done with contrast here in [place].  This showed a 4 mm lung nodule in the left lower lobe.  According to Dr. Dorothey Baseman it is stable over 10 years.  I personally visualized this.  Some bibasilar atelectasis also reported although I am not convinced about this.  Patient herself states that overall she is stable.  She says that in July 2022 she had COVID and after that she had significant cough.  Primary care treated with cough Tessalon Perles.  The cough is now resolved for the last 5 weeks.  Currently no undue shortness of breath.  No cough no wheezing no paroxysmal nocturnal dyspnea no orthopnea.  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  She is a remote 30 pack smoker having quit in 1984 when she moved from [place] to [place].  While in [place] as a child she got exposed to her both grandfather and aunt with tuberculosis.  She says she herself was skin test positive but denies any latent TB treatment.  She also says that she got exposed to significant pollution  in [place] with a steel mills in the coal [place].  But this was just general air pollution.  She is worried about the implications of all this for her health.  She says clinically her previous primary care physician told her she might have COPD.  She also is a brother with COPD.  She wants to be sure that there is no undue health issues with her lung.  She also wants to continue doing ballroom dancing at a championship level.    CT Chest data 10/21/21   Narrative & Impression  CLINICAL DATA:  Renal artery aneurysm   EXAM: CT ANGIOGRAPHY ABDOMEN   TECHNIQUE: Multidetector CT imaging of the abdomen was performed using the standard protocol during bolus administration of intravenous contrast. Multiplanar reconstructed images and MIPs were obtained and reviewed to evaluate the vascular anatomy.   RADIATION DOSE REDUCTION: This exam was performed according to the departmental dose-optimization program which includes automated exposure control, adjustment of the mA and/or kV according to patient size and/or use of iterative reconstruction technique.   CONTRAST:  75mL OMNIPAQUE IOHEXOL 350 MG/ML SOLN   COMPARISON:  CT abdomen dated August 13, 2012   FINDINGS: VASCULAR   Aorta: Normal caliber abdominal aorta with mild calcified plaque and no significant stenosis.   Celiac: Normal caliber celiac artery with no significant atherosclerotic disease or stenosis.   SMA:  Visualized SMA is normal in caliber with no significant atherosclerotic disease or stenosis.   Renals: Peripherally calcified renal artery aneurysm arising from a lower pole segmental branch measuring 1.1 x 1.1 x 1.3 cm, unchanged in size when compared to prior exam. Bilateral main renal arteries are normal in caliber with minimal calcified plaque and no stenosis.   IMA: Visualized portion is normal in caliber with no evidence of stenosis.   Veins: No venous abnormality.   Review of the MIP images confirms the  above findings.   NON-VASCULAR   Lower chest: Stable solid 4 mm nodule of the left lower lobe located on image 13, likely benign. Bibasilar atelectasis.   Hepatobiliary: No focal liver abnormality is seen. No gallstones, gallbladder wall thickening, or biliary dilatation.   Pancreas: Unremarkable. No pancreatic ductal dilatation or surrounding inflammatory changes.   Spleen: Normal in size without focal abnormality.   Adrenals/Urinary Tract: Adrenal glands are unremarkable. Kidneys are normal, without renal calculi, focal lesion, or hydronephrosis. Bladder is unremarkable.   Stomach/Bowel: Visualized portions of the small and large bowel are unremarkable.   Lymphatic: No pathologically enlarged lymph nodes seen in the abdomen.   Other: No abdominal wall hernia or abdominal ascites.   Musculoskeletal: No acute or significant osseous findings.   IMPRESSION: 1. 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 2.  Aortic Atherosclerosis (ICD10-I70.0).     Electronically Signed   By: Allegra Lai M.D.   On: 10/22/2021 10:03       12/28/2021 Follow up : Lung nodule  Patient returns for a 65-month follow-up.  Patient was seen for pulmonary consult November 09, 2021 for abnormal CT chest that showed 4 mm lung nodule.  Patient also has some mild shortness of breath with activities.  Has a family history of TB and COPD.  She is a former smoker.  Patient was set up for a high-resolution CT chest that showed no enlarged lymph nodes.  A 0.4 cm nodule in the left lower lobe that is stable compared back to 2012.  Consistent with a benign etiology.  Clustered nodularity and consolidation in the right apex with associated parenchymal scarring as well in the left upper lobe.  Findings are consistent with possible atypical infection such as atypical Mycobacterium.  Alpha-1 testing was normal level and phenotype.  Patient says she has very minimum cough if any.  Shortness of breath  is very mild in nature.  Patient was set up for pulmonary function testing that was normal that showed FEV1 at 125%, ratio 80, FVC 117%.,  No significant bronchodilator response, DLCO 82%. She says that she gets bronchitis once or twice a year.  Usually resolves pretty easily.  She says most days she has no cough at all.  Is not limited by activities.  She did have COVID-19 in July 2022. Patient does have colitis and has issues with weight loss at times.  Says occasionally she has some swallow issues.  She denies any hemoptysis, discolored mucus, fever.   IMPRESSION: 1. Small pulmonary nodules of the dependent left lower lobe are stable on examinations dating back to at least 09/15/2010 and definitively benign. No further follow-up or characterization is indicated for these nodules. 2. Clustered centrilobular nodularity and consolidation of the medial right pulmonary apex with some associated pleuroparenchymal scarring, as well as of the peripheral posterior left upper lobe. 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 These findings are new in comparison to remote prior CT of the  chest dated 09/15/2010.   Aortic Atherosclerosis (ICD10-I70.0).     Electronically Signed   By: Jearld Lesch M.D.   On: 12/17/2021 13:00     06/30/2022 Patient presents today for acute office visit. She has a history of pulmonary nodules.  She had a CT in April 2023 that showed findings atypical mycobacterium. She has had a cough since August.  Primary care prescribed rifampin, ethambutol and azithromycin recently d/t her symptoms and image findings. She developed white tongue and sore throat after taking medication for 3 days. She did notice improvement in cough while on abx. She has had no sputum cultures. She has never had a bronchoscopy. No recent chest imaging. She plans to travel to [place] on 07/09/22. No significant shortness of breath except with coughing fits.  Cough can be productive with white-yellow phlegm. She is afebrile.    Subjective:  Patient ID: Elaine Navarro, female , DOB: 1946-06-17 , age 24 y.o. , MRN: [id number] , ADDRESS: [address] [place] [address] PCP Thana Ates, MD Patient Care Team: Thana Ates, MD as PCP - General (Internal Medicine)  This Provider for this visit: Treatment Team:  Attending Provider: Kalman Shan, MD    08/18/2022 -   Chief Complaint  Patient presents with   Follow-up    Pt states she is still coughing and also has chest congestion as well as tightness in chest. States her cough is worse in the evening and states she is coughing up white phlegm.     HPI Turkey K Punch 78 y.o. -returns for follow-up.  Presents with her husband.  I saw her earlier in the year in 2023.  After that she has had visits with the nurse practitioner.  She tells me that the cough that started in the summer 2022 after COVID still persist.  After I saw her I ordered CT scan of the chest that showed nodularity that was clustered.  Her primary care physician empirically prescribed a triple antibiotic therapy for MAI infection.  However she developed a coated tongue.  Also given the empiric nature of the diagnosis we recommended she stop it.  Which she did.  After that in October 2023 she saw a Publishing rights manager.  Sputum culture grew haemophilus influenza and Candida.  She was given Z-Pak.  She did leave for her trip to [place] on 07/09/2022 but the trip got changed because of the [place] war.  Instead she went to [place] and from there it was a [place] cruise from 07/11/2022.  She travel to the [place] of [place] in [place].  She went to [place] she went to [place] and she came back to [place] via [place] on 07/24/2022.  Shortly after arriving she got COVID-19 for the second time.  She believes she contracted this in the flight.  During the trip her cough is significantly  worse rated at 8 out of 9.  The Z-Pak that she was prescribed she took it at that time and it did help but currently she is back with residual cough at the level 5 out of 10.  Chest feels tight.  There is occasional white stuff.  Particularly sputum.  She is quite miserable with the symptoms.  She also has a runny nose for the last 5 weeks.  She wants her symptoms addressed.  Labs show normal QuantiFERON gold but no allergy workup. Alpha-1 antitrypsin is normal. She is a former 30 pack smoking history.   Of note  for this visit I personally visualized the CT scan with the previous CT scan from April 2023.     OV 10/11/2022  Subjective:  Patient ID: Elaine Navarro, female , DOB: 06-16-1946 , age 10 y.o. , MRN: [id number] , ADDRESS: [address] [place] [facility] [address] PCP Thana Ates, MD Patient Care Team: Thana Ates, MD as PCP - General (Internal Medicine)  This Provider for this visit: Treatment Team:  Attending Provider: Kalman Shan, MD    10/11/2022 -   Chief Complaint  Patient presents with   Follow-up    Cough is improving. She has not started her spiriva due to high cost. Her cough is occ prod with white sputum. Appetite is good. No fevers, sweats.     HPI Turkey K Pita 78 y.o. -returns for follow-up.  At this point in time under the coordination of Department of Public Health she is completed 2 weeks of Mycobacterium tuberculosis treatment and date labs of therapy.  She already states that her cough is improved significantly her chest tightness is improved significantly although residual symptoms are still there.  She is feeling better she is tolerating the antituberculous treatment well.  She is no longer considered contagious and has been allowed to go in public according to her history.  Her husband is with her but he is not providing any independent history today.  The test sputum culture was done by nurse practitioner in October 2023.  Right  prior to starting the antituberculous treatment we did a CT scan of the chest and a pulmonary right upper lobe nodules were actually getting worse.  A bronchoscopy was done early January 2024 and the nucleocapsid amplification of Mycobacterium tuberculosis negative but the culture is still pending and so far no growth.  Nevertheless she is feeling better.  Of note she had some mild emphysema on the CT scan we ordered Spiriva but it is too expensive and she is feeling better so we told her to hold off.   Her current response to typical treatment and terms of cough is profile below     CT cest 09/21/22   Narrative & Impression  CLINICAL DATA:  78 year old female presents for evaluation of chronic productive cough.   EXAM: CT CHEST WITHOUT CONTRAST   TECHNIQUE: Multidetector CT imaging of the chest was performed following the standard protocol without IV contrast.   RADIATION DOSE REDUCTION: This exam was performed according to the departmental dose-optimization program which includes automated exposure control, adjustment of the mA and/or kV according to patient size and/or use of iterative reconstruction technique.   COMPARISON:  December 17, 2021   FINDINGS: Cardiovascular: Calcified aortic atherosclerosis is mild. Normal caliber of the thoracic aorta. Normal heart size. Scattered coronary artery calcifications. Normal caliber of central pulmonary vasculature.   Mediastinum/Nodes: LEFT thyroid nodule up to 2.3 cm with areas of calcification. No adenopathy in the chest. Esophagus is grossly normal by CT.   Lungs/Pleura: Biapical scarring. More confluent area of nodularity in the RIGHT upper lobe has increased considerably since previous imaging 3.0 x 1.3 cm. Previously this was confined to an area along the medial and lateral surface of the apical pleural surfaces.   There are diffuse areas of nodularity in the RIGHT upper lobe metabolic so increased, some areas new since  previous imaging. Juxtapleural nodule in the anterior RIGHT upper lobe (image 36/5) 8 mm, this is a new finding. Grouped nodules in the RIGHT upper lobe and some tree in bud  on images 36 through 41 are also new.   Some improvement with respect to bronchovascular nodularity extending towards the RIGHT lung apex that was seen in the medial RIGHT upper lobe on the previous study. No signs of additional consolidative process or evidence of pleural effusion. Mild LEFT apical scarring.   Stable LEFT lower lobe nodule (image 132/5) 4 mm. Airways are patent. No consolidation or sign of pleural effusion.   Upper Abdomen: No acute upper abdominal findings to the extent evaluated.   Musculoskeletal: No chest wall mass.  Spinal degenerative changes.   IMPRESSION: 1. Resolution of some areas of nodularity with worsening of peripheral areas of nodularity in the RIGHT upper lobe. Findings favor sequela of infection perhaps MAI or atypical infection. 2. More confluent area of nodularity also having developed since previous imaging. Would suggest short interval follow-up to ensure that this area shows improvement and does not show persistent underlying masslike area or nodule. Would also consider correlation with respiratory symptoms and pulmonology consultation if not yet obtained. 3. LEFT thyroid nodule up to 2.3 cm with areas of calcification. Suggest follow-up thyroid ultrasound if not yet obtained. 4. Aortic atherosclerosis and coronary artery disease.   Aortic Atherosclerosis (ICD10-I70.0).     Electronically Signed   By: Donzetta Kohut M.D.   On: 09/22/2022 14:09     OV 01/17/2023  Subjective:  Patient ID: Elaine Navarro, female , DOB: 03-09-1946 , age 22 y.o. , MRN: [id number] , ADDRESS: [address] [place] [address] PCP Thana Ates, MD Patient Care Team: Thana Ates, MD as PCP - General (Internal Medicine)  This Provider for this visit: Treatment Team:   Attending Provider: Kalman Shan, MD  #Former heavy smoker 30 pack - #Right upper lobe nodule April 2023 worse in January 2024  -Sputum culture + October 2023 for Mycobacterium tuberculosis and also haemophilus  -Multiple QuantiFERON gold negative  -exposed to tuberculosis as an infant from her grandfather in the 1940s  01/17/2023 -   Chief Complaint  Patient presents with   Follow-up    CT F/up     HPI Turkey K Azizi 78 y.o. -returns for follow-up.  Presents with her husband was an independent historian.  She now reports that her cough is even better with continued antituberculous treatment but beginning December 20, 2022 while on antituberculous treatment with close monitoring by the Department of Public Health she started having transaminitis.  Her AST went up to 84 and ALT went up to 108.  Then on Jan 13, 2023 AST was 114 and ALT was 144 and then on Jan 15, 2023 the antituberculous treatment was stopped.  She is not having any jaundice or discolored urine.  She has gained a little weight.  She says the cough is actually better.  She says she is getting good support from the [employer].  However she is worried about drug-induced liver injury.  Husband states she is really worried about this.  She is reading things on the Internet.  She says the public health department might switch her to another antituberculous treatment.  They are also requesting a right upper quadrant ultrasound.  She had CT scan of the chest May 2025 personally visualized it.  I am not fully sure if she is significantly improved or not compared to a year ago.  I asked for repeat read.  I did show her the CT report.  I also showed her the CT images.  In addition she  did have some vaginal yeast infection a month ago and took Diflucan pessary but now the vaginal itch is back.  Started having some pain in the right low back for the last 1 week.  She is got some dry mouth for the last 1 month.  She will  discuss this with primary care physician.       CT chest 01/13/23  Narrative & Impression  CLINICAL DATA:  Chronic cough, former smoker   EXAM: CT CHEST WITHOUT CONTRAST   TECHNIQUE: Multidetector CT imaging of the chest was performed following the standard protocol without IV contrast.   RADIATION DOSE REDUCTION: This exam was performed according to the departmental dose-optimization program which includes automated exposure control, adjustment of the mA and/or kV according to patient size and/or use of iterative reconstruction technique.   COMPARISON:  CT chest, 09/21/2022 thyroid ultrasound, 05/12/2022   FINDINGS: Cardiovascular: Aortic atherosclerosis. Normal heart size. Left and right coronary artery calcifications. No pericardial effusion.   Mediastinum/Nodes: No enlarged mediastinal, hilar, or axillary lymph nodes. Thyroid gland, trachea, and esophagus demonstrate no significant findings.   Lungs/Pleura: No significant change in an area of consolidation and volume loss of the right pulmonary apex (series 8, image 5). Adjacent clustered nodularity and small consolidations of overall improved, however are fluctuant, with a new nodule medially measuring 0.5 cm (series 8, image 22). Additional, occasional small nodules and ground-glass opacities elsewhere, for example a 0.4 cm nodule of the dependent left lower lobe, are unchanged (series 8, image 124). No pleural effusion or pneumothorax.   Upper Abdomen: No acute abnormality. Densely rim calcified aneurysm of the right renal artery or a branch arteriole measuring 1.3 x 1.2 cm (series 2, image 156).   Musculoskeletal: No chest wall abnormality. No acute osseous findings.   IMPRESSION: 1. No significant change in an area of consolidation and volume loss of the right pulmonary apex. Adjacent clustered nodularity and small consolidations of overall improved, however fluctuant. Findings are consistent with ongoing  atypical infection and chronic underlying sequelae, particularly atypical Mycobacterium. 2. Additional, occasional small nodules and ground-glass opacities elsewhere unchanged. 3. Incidental note of a densely rim calcified aneurysm of the right renal artery or a branch arteriole measuring 1.3 x 1.2 cm. 4. Coronary artery disease.   Aortic Atherosclerosis (ICD10-I70.0).     Electronically Signed   By: Jearld Lesch M.D.   On: 01/17/2023 12:18    OV 05/02/2023  Subjective:  Patient ID: Elaine Navarro, female , DOB: 11-07-1945 , age 68 y.o. , MRN: [id number] , ADDRESS: [address] [place] [address] PCP Thana Ates, MD Patient Care Team: Thana Ates, MD as PCP - General (Internal Medicine) Pricilla Riffle, MD as PCP - Cardiology (Cardiology)  This Provider for this visit: Treatment Team:  Attending Provider: Kalman Shan, MD    05/02/2023 -   Chief Complaint  Patient presents with   Follow-up    F/up on TB exposure     HPI Turkey K Steve 78 y.o. -followup MTB/ Discussed in cased conference June 2024: Jan 13, 2023 and Sep 21, 2022 -> [place] and waingin CT but there are areas of improvement but also new areas of nodulairty.Overall slightly better.  Has also seen ID Dr Rutha Bouchard - he supports Dx of MTB/ Then 04/10/23 saw cardiology: reported improvement in DOE. Co Caslcium was 42nd percentile.  She presents now with her husband for follow-up.  She says her cough is completely resolved.  She is feeling  great.  Her weight is good.  No shortness of breath no wheezing.  She completed direct observed therapy by the public health department on Friday, 04/28/2023.  She said they did a chest x-ray was clear.  She wants to have a CT scan of the chest.  We discussed that our case conference and I shared these findings with her.  She and her husband wanted know about travel history.  I did tell them she is free to travel.  They are going to go to [place] in [place] in May  2025 for their 60th wedding anniversary.  She also was asking about travel advice particularly masking around planes.  We went over this.  I did advise her to mask in airports and planes and in any indoor cluster.  Did advise her to be up-to-date with all her vaccines.  Did indicate that I will give her prednisone Paxlovid and antibiotic for travel to [place].  We went over her pneumonia vaccination history.  She is opted to take the Prevnar 20 today.  She will have flu shot and COVID shot on her own this fall 2024.     reatment started 09/21/2022 ([facility]) Has been on consolidation with RI Lft rising to 150s and tx hold 5/06   5/02 cbc 4.7/14/237; lft 114/144/94/0.4 (ast/alt/alkphos/tbili) 5/14 lft 90/123/87/0.5    OV 07/31/2023  Subjective:  Patient ID: Elaine Navarro, female , DOB: 1945-11-23 , age 69 y.o. , MRN: [id number] , ADDRESS: [address] [place] [address] PCP Thana Ates, MD Patient Care Team: Thana Ates, MD as PCP - General (Internal Medicine) Pricilla Riffle, MD as PCP - Cardiology (Cardiology)  This Provider for this visit: Treatment Team:  Attending Provider: Kalman Shan, MD    07/31/2023 -   Chief Complaint  Patient presents with   Follow-up    Ct f/u 11/1, pt states she has questions      HPI Turkey K Shoberg 78 y.o. -returns for follow-up.  Presents with her husband.  History is provided mostly by her but also review of the external medical record and to some extent the husband is an independent historian.After the last visit she was doing well.  However after coming of antituberculous treatment in August 2024 she felt she had a recurrence of her GI issues.  She is complaining of cramps particular in the suprapubic area along with diarrhea and bloating and abdominal pain.  She was evaluated by Dr. Barnie Mort at [facility].  I reviewed his notes.  Initially seen 06/19/2023.  By July 07, 2023 C. difficile antigen was  positive.  She took p.o. vancomycin.  She showed me those results.  Diarrhea got better she took p.o. vancomycin for 10 days.  But today the diarrhea is back and the cramps are beginning to come back.  She is worried the C. difficile is back.  She plans to call Dr. Zara Council today.  From  From a respiratory standpoint she is doing well.  She has very mild cough.  She is completed antituberculous treatment.  Weight is holding.  Cough score as below.  She had CT scan of the chest November 2024 and I compared the nodules compared to January 2024 and also April 2023.  In my personal visualization opinion these nodules are significantly improved.  There are some residual bronchiectasis.  I showed this to her.  Radiology feels it is stable but I feel it is improved.  She has travel coming up  to [place] and [place] for her Gerrie Nordmann wedding anniversary in May 2025.  She and I agreed for her to do a pretravel visit in March 2025.        CT Chest data from date: yes  - personally visualized and independently interpreted : yes - my findings are: improved nodules arrative & Impression  CLINICAL DATA:  78 year old female with history of pulmonary nodules. History of mycobacterium tuberculosis exposure.   EXAM: CT CHEST WITHOUT CONTRAST   TECHNIQUE: Multidetector CT imaging of the chest was performed following the standard protocol without IV contrast.   RADIATION DOSE REDUCTION: This exam was performed according to the departmental dose-optimization program which includes automated exposure control, adjustment of the mA and/or kV according to patient size and/or use of iterative reconstruction technique.   COMPARISON:  Cardiac CT 03/02/2023.  Chest CT 01/13/2023.   FINDINGS: Cardiovascular: Heart size is normal. There is no significant pericardial fluid, thickening or pericardial calcification. There is aortic atherosclerosis, as well as atherosclerosis of the great vessels of the mediastinum and the  coronary arteries, including calcified atherosclerotic plaque in the left anterior descending and right coronary arteries.   Mediastinum/Nodes: No pathologically enlarged mediastinal or hilar lymph nodes. Please note that accurate exclusion of hilar adenopathy is limited on noncontrast CT scans. Esophagus is unremarkable in appearance. No axillary lymphadenopathy.   Lungs/Pleura: Pleural-parenchymal thickening and areas of architectural distortion and nodularity again noted, most evident in the lung apices, very similar to the prior study. A few other scattered areas of mild cylindrical bronchiectasis and peripheral micro nodularity are noted elsewhere in the lungs, also similar to the prior study, most evident throughout the upper lobes of the lungs, generally measuring 4 mm or less in size. No definite new suspicious appearing pulmonary nodules or masses are noted. No acute consolidative airspace disease. No pleural effusions.   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 Atherosclerotic calcifications in the abdominal aorta.   Musculoskeletal: There are no aggressive appearing lytic or blastic lesions noted in the visualized portions of the skeleton.   IMPRESSION: 1. 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 No new or acute findings are noted. 2. Aortic atherosclerosis, in addition to two-vessel coronary artery disease. Please note that although the presence of coronary artery calcium documents the presence of coronary artery disease, the severity of this disease and any potential stenosis cannot be assessed on this non-gated CT examination. Assessment for potential risk factor modification, dietary therapy or pharmacologic  therapy may be warranted, if clinically indicated. 3. 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 This is unchanged. Follow-up nonemergent CTA of the abdomen is suggested in the near future to provide definitive characterization and establish a baseline for future follow-up examinations.   Aortic Atherosclerosis (ICD10-I70.0).     Electronically Signed   By: Trudie Reed M.D.   On: 07/28/2023 08:54      OV 11/03/2023  Subjective:  Patient ID: Elaine Navarro, female , DOB: [birth year] , age 25 y.o. , MRN: [id number] , ADDRESS: [address] [place] [address] PCP Thana Ates, MD Patient Care Team: Thana Ates, MD as PCP - General (Internal Medicine) Pricilla Riffle, MD as PCP - Cardiology (Cardiology)  This  Provider for this visit: Treatment Team:  Attending Provider: Kalman Shan, MD  Type of visit: Video Virtual Visit Identification of patient KAIDEN DARDIS with 11/23/1945 and MRN [id number] - 2 person identifier Risks: Risks, benefits, limitations of telephone visit explained. Patient understood and verbalized agreement to proceed Anyone else on call: just him + husband on side Patient location: her home This provider location: [address]; [place]; [place] [address]. [facility]. 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 - C Ciff resolved but having colitis an taking budesonide and is on 90 day Rx. [place]ing to [place]. Having some cough. Very mild mucus only. Clear mucuh. Cough severity is 1-2 of 10 only. In May 2025: going to [place] and [place] -> will make a visit to get travel advice from pulmonary perspective.  OF note, she is going to start PROLIA for OP - conc\ern is infection risk esp pumonary. 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   D/w Chesley Mires our pharmacist - slight general increase infection risk but if no active infection no contraindication . Showed her case control data and also [employer].    OV 11/20/2023  Subjective:  Patient ID: Elaine Navarro, female , DOB: 1946/07/21 , age 74 y.o. , MRN: [id number] , ADDRESS: [address] [place] [address] PCP Thana Ates, MD Patient Care Team: Thana Ates, MD as PCP - General (Internal Medicine) Pricilla Riffle, MD as PCP - Cardiology (Cardiology)  This Provider for this visit: Treatment Team:  Attending Provider: Kalman Shan, MD    11/20/2023 -   Chief Complaint  Patient presents with   Follow-up    Leaving for [place] end of April 2025- requesting meds to take with her in case she gets sick. She has occ cough with white sputum.      HPI Turkey K Lubben 78 y.o. -returns for follow-up.  Presents with her husband.  At this point in time she tells me that since Christmas 2024 she is having a mild dry cough occasional white sputum but otherwise doing well.  The cough itself is stable.  She is not having any fever or chills.  She is finished her tuberculosis treatment.  She is willing to get a chest x-ray today.  Of note she is going to go to [place] for [place] trip.  The trip is going to be end of April 2025.  She wants medications.  We discussed Paxlovid in case she gets COVID.  We discussed Z-Pak and prednisone in case as respiratory exacerbations of bronchitis.  We discussed cephalexin for possible cellulitis.  A 1 year scan is due as part of her smoking history and tuberculosis follow-up in November 2025.  Although she rated there is a very mild cough on the RSI cough score the cough score is 16.   Dr Gretta Cool Reflux Symptom Index (> 13-15 suggestive of LPR cough) Pre RX  - ATb Rx Post start ATbx Rx - 2 weeks 07/31/2023 Completed ATT by Dept of Public health 11/20/2023 Mild cough back since  Christmas 2024  Hoarseness of problem with voice 5 2 0 3  Clearing  Of Throat 5 3 0 3  Excess throat mucus or feeling of post nasal drip 5 3 1 3   Difficulty swallowing food, liquid or tablets 3  2 0 0  Cough after eating or lying down 5 3 0 0  Breathing difficulties or choking episodes 4 2 0 0  Troublesome or annoying cough 5 3 0 3  Sensation of something sticking in throat or lump in throat 5 2 0 3  Heartburn, chest pain, indigestion, or stomach acid coming up 3 2 0 1  TOTAL 40 21 0 16     Cultures: 1/02 afb sputum -- few mtb (S inh mic 0.1, rifampin mic 1, ethambutol mic 5; pza not done as not available currently) 2/14 sputum cx -- few mtb 4/03 sputum cx -- ngtd 4/04 sputum cx -- ngtd 4/17 sputum cx -- ngtd 4/18 sputum cx -- ngtd  PFT     Latest Ref Rng & Units 12/28/2021   10:42 AM  PFT Results  FVC-Pre L 3.18   FVC-Predicted Pre % 121   FVC-Post L 3.07   FVC-Predicted Post % 117   Pre FEV1/FVC % % 72   Post FEV1/FCV % % 80   FEV1-Pre L 2.29   FEV1-Predicted Pre % 117   FEV1-Post L 2.44   DLCO uncorrected ml/min/mmHg 15.17   DLCO UNC% % 82   DLCO corrected ml/min/mmHg 15.17   DLCO COR %Predicted % 82   DLVA Predicted % 72   TLC L 4.40   TLC % Predicted % 89   RV % Predicted % 64        LAB RESULTS last 96 hours No results found.       has a past medical history of Aneurysm of renal artery (HCC), Fracture of right wrist (2011), GERD (gastroesophageal reflux disease), Hypertension, Left bundle branch block, Left thyroid nodule (2003), Lymphocytic colitis (03/2013), Migraines, Osteoporosis, PONV (postoperative nausea and vomiting), Sciatica, and Visceral injury.   reports that she quit smoking about 40 years ago. Her smoking use included cigarettes. She started smoking about 50 years ago. She has a 30 pack-year smoking history. She has never used smokeless tobacco.  Past Surgical History:  Procedure Laterality Date   ABDOMINOPLASTY  2010   APPENDECTOMY   1962   BREAST EXCISIONAL BIOPSY Bilateral 2001   x 3   BRONCHIAL WASHINGS  09/16/2022   Procedure: BRONCHIAL WASHINGS;  Surgeon: Kalman Shan, MD;  Location: [facility] ENDOSCOPY;  Service: Endoscopy;;   CATARACT EXTRACTION, BILATERAL Bilateral 01/2020   Dental implants  1993   HEMORRHOID SURGERY  2000   JOINT REPLACEMENT     KNEE ARTHROSCOPY W/ DEBRIDEMENT Right 07/2020   Dr. Luiz Blare   TOTAL KNEE ARTHROPLASTY Right 01/21/2022   Procedure: RIGHT TOTAL KNEE ARTHROPLASTY;  Surgeon: Kathryne Hitch, MD;  Location: [facility] ORS;  Service: Orthopedics;  Laterality: Right;  RFNA   VAGINAL HYSTERECTOMY  1974   Dysfunctional uterine bleeding.   VIDEO BRONCHOSCOPY N/A 09/16/2022   Procedure: VIDEO BRONCHOSCOPY WITHOUT FLUORO;  Surgeon: Kalman Shan, MD;  Location: [facility] ENDOSCOPY;  Service: Endoscopy;  Laterality: N/A;    Allergies  Allergen Reactions   Caine-1 [Lidocaine] Swelling    Allergic to Septocaine Articaine HCL 4% w/epi   Indomethacin Swelling   Other Diarrhea    Dairy Products    Articaine-Epinephrine Other (See Comments)   Gabapentin     "Twitches"   Hydrocodone Nausea Only   Latex Swelling    Possible allergy   Lamisil [Terbinafine] Rash    Immunization History  Administered Date(s) Administered   Fluad Quad(high Dose 65+) 05/29/2022, 05/14/2023   Hepatitis A 03/10/2003, 10/15/2003   Hepatitis B 04/15/2003,  05/21/2003, 10/15/2003   Influenza Split 06/09/2014, 06/08/2015, 06/08/2017, 06/19/2018, 05/27/2019, 05/18/2020, 05/31/2021   Influenza, High Dose Seasonal PF 06/19/2018   Influenza,inj,Quad PF,6+ Mos 06/13/2011   Influenza-Unspecified 05/16/2013   PFIZER(Purple Top)SARS-COV-2 Vaccination 10/01/2019, 10/21/2019, 05/07/2020, 12/15/2020, 05/31/2021, 06/01/2022   Pneumococcal Conjugate-13 10/18/2013   Pneumococcal Polysaccharide-23 06/22/2010   Respiratory Syncytial Virus Vaccine,Recomb Aduvanted(Arexvy) 05/11/2022   Td 03/10/2003   Tdap 11/08/2010, 12/29/2019,  01/11/2020   Typhoid Inactivated 03/10/2003, 07/05/2013   Yellow Fever 07/05/2013   Zoster, Live 01/19/2006, 12/21/2016, 06/26/2017    Family History  Problem Relation Age of Onset   Hypertension Mother    Cancer Sister        melanoma   Autoimmune disease Sister        crest and sjor   Lupus Brother      Current Outpatient Medications:    albuterol (VENTOLIN HFA) 108 (90 Base) MCG/ACT inhaler, Inhale 2 puffs into the lungs every 6 (six) hours as needed for wheezing or shortness of breath., Disp: 8 g, Rfl: 2   amLODipine (NORVASC) 10 MG tablet, Take 10 mg by mouth daily., Disp: , Rfl:    atorvastatin (LIPITOR) 10 MG tablet, Take 0.5 tablets (5 mg total) by mouth daily., Disp: 45 tablet, Rfl: 3   azithromycin (ZITHROMAX) 250 MG tablet, Take as directed, Disp: 6 tablet, Rfl: 0   botulinum toxin Type A (BOTOX) 100 units SOLR injection, Inject into the skin., Disp: , Rfl:    budesonide (ENTOCORT EC) 3 MG 24 hr capsule, Take 3 mg by mouth daily as needed (colitis flare)., Disp: , Rfl:    buPROPion (WELLBUTRIN XL) 150 MG 24 hr tablet, TAKE 1 TABLET(150 MG) BY MOUTH DAILY, Disp: 90 tablet, Rfl: 1   cephALEXin (KEFLEX) 500 MG capsule, Take 1 capsule (500 mg total) by mouth 3 (three) times daily., Disp: 15 capsule, Rfl: 0   chlorhexidine (PERIDEX) 0.12 % solution, SMARTSIG:By Mouth, Disp: , Rfl:    clobetasol cream (TEMOVATE) 0.05 %, APPLY TOPICALLY TO THE AFFECTED AREA TWICE DAILY. STOP WHEN SMOOTH. AVOID FACE AND BODY FOLDS, Disp: , Rfl:    ferrous sulfate 325 (65 FE) MG EC tablet, Take 325 mg by mouth every Monday., Disp: , Rfl:    finasteride (PROSCAR) 5 MG tablet, Take 5 mg by mouth daily., Disp: , Rfl:    Melatonin 5 MG TABS, Take 5 mg by mouth at bedtime as needed (sleep)., Disp: , Rfl:    Menaquinone-7 (VITAMIN K2) 100 MCG CAPS, Take 100 mcg by mouth daily., Disp: , Rfl:    Multiple Vitamin (MULTIVITAMIN) tablet, Take 1 tablet by mouth daily., Disp: , Rfl:    nirmatrelvir/ritonavir  (PAXLOVID, 300/100,) 20 x 150 MG & 10 x 100MG  TBPK, Take 3 tablets by mouth 2 (two) times daily for 5 days. Patient GFR is 86. Take nirmatrelvir (150 mg) two tablets twice daily for 5 days and ritonavir (100 mg) one tablet twice daily for 5 days., Disp: 30 tablet, Rfl: 0   Potassium 99 MG TABS, Take 99 mg by mouth daily., Disp: , Rfl:    Povidone, PF, (IVIZIA DRY EYES) 0.5 % SOLN, Place 1 drop into both eyes at bedtime., Disp: , Rfl:    predniSONE (DELTASONE) 10 MG tablet, 4 x 1 day, 3 x 1 day, 2 x 1 day, 1 x 1 day then 1/2 x 1 day and stop, Disp: 11 tablet, Rfl: 0   SUMAtriptan (IMITREX) 50 MG tablet, Take 50 mg by mouth every 2 (two) hours as  needed for migraine., Disp: , Rfl:    traMADol (ULTRAM) 50 MG tablet, Take 50 mg by mouth 4 (four) times daily as needed., Disp: , Rfl:    triamcinolone ointment (KENALOG) 0.1 %, Apply to left nare daily as needed., Disp: , Rfl:    valsartan (DIOVAN) 320 MG tablet, TAKE 1 TABLET(320 MG) BY MOUTH DAILY, Disp: 90 tablet, Rfl: 1      Objective:   Vitals:   11/20/23 1337  BP: 122/72  Pulse: 77  SpO2: 100%  Weight: 107 lb (48.5 kg)  Height: 5\' 3"  (1.6 m)    Estimated body mass index is 18.95 kg/m as calculated from the following:   Height as of this encounter: 5\' 3"  (1.6 m).   Weight as of this encounter: 107 lb (48.5 kg).  @WEIGHTCHANGE @  [employer]   11/20/23 1337  Weight: 107 lb (48.5 kg)     Physical Exam   General: No distress. Looks well O2 at rest: no Cane present: no Sitting in wheel chair: no Frail: no Obese: n Neuro: Alert and Oriented x 3. GCS 15. Speech normal Psych: Pleasant Resp:  Barrel Chest - no.  Wheeze - no, Crackles - no, No overt respiratory distress CVS: Normal heart sounds. Murmurs - no Ext: Stigmata of Connective Tissue Disease - no HEENT: Normal upper airway. PEERL +. No post nasal drip        Assessment:       ICD-10-CM   1. MTB (Mycobacterium tuberculosis infection)  A15.9 CT Chest Wo Contrast     DG Chest 2 View    CBC w/Diff    Comp Met (CMET)    Comp Met (CMET)    CBC w/Diff    2. Lung nodules  R91.8 CT Chest Wo Contrast    DG Chest 2 View    CBC w/Diff    Comp Met (CMET)    Comp Met (CMET)    CBC w/Diff    3. Chronic cough  R05.3 CT Chest Wo Contrast    DG Chest 2 View    CBC w/Diff    Comp Met (CMET)    Comp Met (CMET)    CBC w/Diff    4. Former heavy cigarette smoker (20-39 per day)  Z87.891 CT Chest Wo Contrast    DG Chest 2 View    CBC w/Diff    Comp Met (CMET)    Comp Met (CMET)    CBC w/Diff    5. Travel advice encounter  Z71.84 CT Chest Wo Contrast    DG Chest 2 View    CBC w/Diff    Comp Met (CMET)    Comp Met (CMET)    CBC w/Diff         Plan:     Patient Instructions     ICD-10-CM   1. 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 Lung nodules  R91.8     3. Chronic cough  R05.3     4. Former heavy cigarette smoker (20-39 per day)  Z87.891     5. C. difficile diarrhea  A04.72     6. Travel advice encounter  Z71.84        MTB (Mycobacterium tuberculosis infection) Lung nodules Chronic cough Former heavy cigarette smoker (20-39 per day)  -Significantly improved and also CT scan of the chest November 2024 is hugely improved compared to April 2023 in January 2024.  Glad you finished antituberculous treatment but it appears the cough is  back and we will need to understand why.  Plan - CXR 2 viuew 11/20/2023 - cbc with diff 11/20/2023 - repeat CT chest November 2025.  - use albuterol as needed   Travel advice encounter to [place] in [place] in May 2025  Plan - cbc with diff, bmet, lft 11/20/2023 pre-travel -Paxlovid for travel - Z Pak for travel - Cephalexin 500mg  tid x 5 days for travel  - Please take prednisone 40 mg x1 day, then 30 mg x1 day, then 20 mg x1 day, then 10 mg x1 day, and then 5 mg x1 day and stop    Follow-up - Return in NOv 2025 to see DR Marchelle Gearing but after ct chest   FOLLOWUP Return in  about 8 months (around 07/22/2024) for 15 min visit, with Dr Marchelle Gearing, Face to Face Visit.    SIGNATURE    Dr. Kalman Shan, M.D., F.C.C.P,  Pulmonary and Critical Care Medicine Staff Physician, [facility] Director - Interstitial Lung Disease  Program  Pulmonary Fibrosis [facility] [place], [place], [address]  Pager: [phone], If no answer or between  15:00h - 7:00h: call [phone] Telephone: [phone]  2:31 PM 11/20/2023

## 2023-11-22 ENCOUNTER — Encounter: Payer: Self-pay | Admitting: Internal Medicine

## 2023-11-22 NOTE — Progress Notes (Signed)
 Blood work is normal

## 2023-11-27 DIAGNOSIS — G43719 Chronic migraine without aura, intractable, without status migrainosus: Secondary | ICD-10-CM | POA: Diagnosis not present

## 2023-11-27 DIAGNOSIS — G43709 Chronic migraine without aura, not intractable, without status migrainosus: Secondary | ICD-10-CM | POA: Diagnosis not present

## 2023-12-06 ENCOUNTER — Other Ambulatory Visit: Payer: Self-pay | Admitting: *Deleted

## 2023-12-06 ENCOUNTER — Ambulatory Visit: Payer: Self-pay

## 2023-12-06 DIAGNOSIS — I2510 Atherosclerotic heart disease of native coronary artery without angina pectoris: Secondary | ICD-10-CM | POA: Diagnosis not present

## 2023-12-06 DIAGNOSIS — R918 Other nonspecific abnormal finding of lung field: Secondary | ICD-10-CM

## 2023-12-06 DIAGNOSIS — I10 Essential (primary) hypertension: Secondary | ICD-10-CM | POA: Diagnosis not present

## 2023-12-06 DIAGNOSIS — I700 Atherosclerosis of aorta: Secondary | ICD-10-CM | POA: Diagnosis not present

## 2023-12-06 MED ORDER — AZITHROMYCIN 250 MG PO TABS: 250.0000 mg | ORAL_TABLET | Freq: Every day | ORAL | 0 refills | Status: DC

## 2023-12-06 MED ORDER — AZITHROMYCIN 250 MG PO TABS: ORAL_TABLET | ORAL | 0 refills | Status: DC

## 2023-12-06 MED ORDER — AZITHROMYCIN 250 MG PO TABS
ORAL_TABLET | ORAL | 0 refills | Status: DC
Start: 2023-12-06 — End: 2023-12-06

## 2023-12-06 MED ORDER — CEPHALEXIN 500 MG PO CAPS: 500.0000 mg | ORAL_CAPSULE | Freq: Three times a day (TID) | ORAL | 0 refills | Status: DC

## 2023-12-06 NOTE — Telephone Encounter (Signed)
  Chief Complaint: Cough-CXR results from 11/20/2023 Symptoms: productive cough with yellow sputum Frequency: prior to 11/20/2023 Pertinent Negatives: Patient denies fever, CP, SOB Disposition: [] ED /[] Urgent Care (no appt availability in office) / [] Appointment(In office/virtual)/ []  [facility]/ [] Home Care/ [] Refused Recommended Disposition /[] [facility]/ [x]  Follow-up with PCP Additional Notes: patient calling with concerns of productive cough and new results of Chest X-ray from 11/20/2023-resulted yesterday. Patient states her chest x-ray is suspicious for pneumonia according to the final report. Patient endorses continued productive cough with yellow sputum. Patient denies fever, CP and SOB. Patient states recent temperature was 98.7 and pulse Ox was 98%. Patient reports taking mucinex which is helping the cough. Per protocol, patient is recommended to follow up with provider for chest x-ray results. Patient is instructed that pulmonary staff will get back to her.   Copied from CRM [id number]. Topic: Clinical - Red Word Triage >> Dec 06, 2023  8:30 AM Adele Barthel wrote: Red Word that prompted transfer to Nurse Triage:   Seen by provider a couple weeks ago Had x-ray performed on 03/10, suspicious for pneumonia Cough is getting worse Cough is productive.  Calling due to seeing results Reason for Disposition  [1] Known COPD or other severe lung disease (i.e., bronchiectasis, cystic fibrosis, lung surgery) AND [2] worsening symptoms (i.e., increased sputum purulence or amount, increased breathing difficulty  Answer Assessment - Initial Assessment Questions 1. ONSET: "When did the cough begin?"      Cough started prior to 3/10 2. SEVERITY: "How bad is the cough today?"      7-8 out of 10 3. SPUTUM: "Describe the color of your sputum" (none, dry cough; clear, white, yellow, green)     yellow 4. HEMOPTYSIS: "Are you coughing up any blood?" If so ask: "How much?" (flecks,  streaks, tablespoons, etc.)     No 5. DIFFICULTY BREATHING: "Are you having difficulty breathing?" If Yes, ask: "How bad is it?" (e.g., mild, moderate, severe)    - MILD: No SOB at rest, mild SOB with walking, speaks normally in sentences, can lie down, no retractions, pulse < 100.    - MODERATE: SOB at rest, SOB with minimal exertion and prefers to sit, cannot lie down flat, speaks in phrases, mild retractions, audible wheezing, pulse 100-120.    - SEVERE: Very SOB at rest, speaks in single words, struggling to breathe, sitting hunched forward, retractions, pulse > 120      No 6. FEVER: "Do you have a fever?" If Yes, ask: "What is your temperature, how was it measured, and when did it start?"     No 7. CARDIAC HISTORY: "Do you have any history of heart disease?" (e.g., heart attack, congestive heart failure)      No 8. LUNG HISTORY: "Do you have any history of lung disease?"  (e.g., pulmonary embolus, asthma, emphysema)     TB 9. PE RISK FACTORS: "Do you have a history of blood clots?" (or: recent major surgery, recent prolonged travel, bedridden)     NO 10. OTHER SYMPTOMS: "Do you have any other symptoms?" (e.g., runny nose, wheezing, chest pain)       Upper back pain with coughing  12. TRAVEL: "Have you traveled out of the country in the last month?" (e.g., travel history, exposures)       No  Protocols used: Cough - Acute Productive-A-AH

## 2023-12-06 NOTE — Telephone Encounter (Signed)
 Dr. Isaiah Serge, Please advise.  Thank you.

## 2023-12-06 NOTE — Telephone Encounter (Signed)
 Called and spoke with patient, advised of recommendations per Dr. Isaiah Serge.  She states that Dr. Monica Becton gave her antibiotics to take with her on her trip out of the country to have on hand in case she needed them.  She will be out of the country from 4/29-5/15, Dr. Marchelle Gearing had no availability before her trip and she preferred to see him and not a NP.  I verified her pharmacy, sent in the antibiotics and schedule a f/u with Dr. Marchelle Gearing after she returns with a cxr prior.  Advised to arrive about 20 minutes early so she can get check in and have the cxr prior to her visit.  She verbalized understanding.  Nothing further needed.

## 2023-12-06 NOTE — Addendum Note (Signed)
 Addended by: Delrae Rend on: 12/06/2023 11:58 AM   Modules accepted: Orders

## 2023-12-06 NOTE — Telephone Encounter (Addendum)
 Reviewed chest x-ray with new infiltrate.  History noted for prior MTB infection She already has a prescription for Z-Pak and cephalexin given by Dr. Marchelle Gearing for backup.  I advised her to take that course and repeat chest x-ray in 4 to 6 weeks.  Please order the chest x-ray under Dr. Jane Canary name and send a new prescription for these antibiotics if she does not have a prior prescription. If there is persistent infiltrate then she will need a CT of the chest.

## 2023-12-11 DIAGNOSIS — E7800 Pure hypercholesterolemia, unspecified: Secondary | ICD-10-CM | POA: Diagnosis not present

## 2023-12-11 DIAGNOSIS — I10 Essential (primary) hypertension: Secondary | ICD-10-CM | POA: Diagnosis not present

## 2023-12-11 DIAGNOSIS — M8000XA Age-related osteoporosis with current pathological fracture, unspecified site, initial encounter for fracture: Secondary | ICD-10-CM | POA: Diagnosis not present

## 2023-12-11 DIAGNOSIS — F32A Depression, unspecified: Secondary | ICD-10-CM | POA: Diagnosis not present

## 2023-12-12 DIAGNOSIS — M47816 Spondylosis without myelopathy or radiculopathy, lumbar region: Secondary | ICD-10-CM | POA: Diagnosis not present

## 2023-12-12 DIAGNOSIS — G894 Chronic pain syndrome: Secondary | ICD-10-CM | POA: Diagnosis not present

## 2023-12-12 DIAGNOSIS — F4321 Adjustment disorder with depressed mood: Secondary | ICD-10-CM | POA: Diagnosis not present

## 2023-12-12 DIAGNOSIS — M25552 Pain in left hip: Secondary | ICD-10-CM | POA: Diagnosis not present

## 2023-12-20 ENCOUNTER — Ambulatory Visit (HOSPITAL_BASED_OUTPATIENT_CLINIC_OR_DEPARTMENT_OTHER): Admitting: Obstetrics & Gynecology

## 2023-12-20 ENCOUNTER — Encounter (HOSPITAL_BASED_OUTPATIENT_CLINIC_OR_DEPARTMENT_OTHER): Payer: Self-pay | Admitting: Obstetrics & Gynecology

## 2023-12-20 VITALS — BP 164/80 | HR 80 | Ht 63.0 in | Wt 105.8 lb

## 2023-12-20 DIAGNOSIS — R110 Nausea: Secondary | ICD-10-CM

## 2023-12-20 LAB — COMPREHENSIVE METABOLIC PANEL WITH GFR
ALT: 35 IU/L — ABNORMAL HIGH (ref 0–32)
AST: 29 IU/L (ref 0–40)
Albumin: 4.7 g/dL (ref 3.8–4.8)
Alkaline Phosphatase: 86 IU/L (ref 44–121)
BUN/Creatinine Ratio: 18 (ref 12–28)
BUN: 13 mg/dL (ref 8–27)
Bilirubin Total: 0.7 mg/dL (ref 0.0–1.2)
CO2: 24 mmol/L (ref 20–29)
Calcium: 10 mg/dL (ref 8.7–10.3)
Chloride: 102 mmol/L (ref 96–106)
Creatinine, Ser: 0.72 mg/dL (ref 0.57–1.00)
Globulin, Total: 2.2 g/dL (ref 1.5–4.5)
Glucose: 98 mg/dL (ref 70–99)
Potassium: 4.3 mmol/L (ref 3.5–5.2)
Sodium: 141 mmol/L (ref 134–144)
Total Protein: 6.9 g/dL (ref 6.0–8.5)
eGFR: 85 mL/min/{1.73_m2} (ref >59)

## 2023-12-20 LAB — CBC WITH DIFFERENTIAL/PLATELET
Basophils Absolute: 0.1 10*3/uL (ref 0.0–0.2)
Basos: 1 %
EOS (ABSOLUTE): 0.1 10*3/uL (ref 0.0–0.4)
Eos: 1 %
Hematocrit: 47 % — ABNORMAL HIGH (ref 34.0–46.6)
Hemoglobin: 15.8 g/dL (ref 11.1–15.9)
Immature Grans (Abs): 0 10*3/uL (ref 0.0–0.1)
Immature Granulocytes: 0 %
Lymphocytes Absolute: 1.9 10*3/uL (ref 0.7–3.1)
Lymphs: 28 %
MCH: 32.9 pg (ref 26.6–33.0)
MCHC: 33.6 g/dL (ref 31.5–35.7)
MCV: 98 fL — ABNORMAL HIGH (ref 79–97)
Monocytes Absolute: 0.5 10*3/uL (ref 0.1–0.9)
Monocytes: 8 %
Neutrophils Absolute: 4.1 10*3/uL (ref 1.4–7.0)
Neutrophils: 62 %
Platelets: 272 10*3/uL (ref 150–450)
RBC: 4.8 x10E6/uL (ref 3.77–5.28)
RDW: 11.7 % (ref 11.7–15.4)
WBC: 6.7 10*3/uL (ref 3.4–10.8)

## 2023-12-20 NOTE — Progress Notes (Unsigned)
 GYNECOLOGY  VISIT  CC:   discuss BMD  HPI: 78 y.o. [id number] Married White or Caucasian female who is scheduled today for discussion of bone density findings and treatment options for osteoporosis.  However, she ports she just does not feel well today.  She and her husband have been in and out of town for every weeks.  For the past few days she has started having dry mouth and nausea.  She has not had any emesis.  She's had hx of colitis and loose stools at times.  This hasn't changed.  Denies fever.  Thinks she may have had some chills.  Denies shortness of breath or chest pain.  She thinks her urine is little more concentrated but not seeing any blood in it.  She has this appointment already scheduled for bone density discussion and decided to just keep it and address the above issues.   Past Medical History:  Diagnosis Date   Aneurysm of renal artery (HCC)    followed at [facility], stable with CT/angiogram 10/2021   Fracture of right wrist 2011   Fell in bathroom.   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    MEDS:   Current Outpatient Medications on File Prior to Visit  Medication Sig Dispense Refill   albuterol (VENTOLIN HFA) 108 (90 Base) MCG/ACT inhaler Inhale 2 puffs into the lungs every 6 (six) hours as needed for wheezing or shortness of breath. 8 g 2   amLODipine (NORVASC) 10 MG tablet Take 10 mg by mouth daily.     atorvastatin (LIPITOR) 10 MG tablet Take 0.5 tablets (5 mg total) by mouth daily. 45 tablet 3   azithromycin (ZITHROMAX) 250 MG tablet Take as directed 6 tablet 0   azithromycin (ZITHROMAX) 250 MG tablet Take 2 tablets by mouth on first day then 1 tablet daily until gone. 6 tablet 0   azithromycin (ZITHROMAX) 250  MG tablet Take 2 tabs by mouth on first day then 1 tab daily until gone. 6 tablet 0   botulinum toxin Type A (BOTOX) 100 units SOLR injection Inject into the skin.     budesonide (ENTOCORT EC) 3 MG 24 hr capsule Take 3 mg by mouth daily as needed (colitis flare).     buPROPion (WELLBUTRIN XL) 150 MG 24 hr tablet TAKE 1 TABLET(150 MG) BY MOUTH DAILY 90 tablet 1   cephALEXin (KEFLEX) 500 MG capsule Take 1 capsule (500 mg total) by mouth 3 (three) times daily. 15 capsule 0   cephALEXin (KEFLEX) 500 MG capsule Take 1 capsule (500 mg total) by mouth 3 (three) times daily. 15 capsule 0   chlorhexidine (PERIDEX) 0.12 % solution SMARTSIG:By Mouth     clobetasol cream (TEMOVATE) 0.05 % APPLY TOPICALLY TO THE AFFECTED AREA TWICE DAILY. STOP WHEN SMOOTH. AVOID FACE AND BODY FOLDS     finasteride (PROSCAR) 5 MG tablet Take 5 mg by mouth daily.     Melatonin 5 MG TABS Take 5 mg by mouth at bedtime as needed (sleep).     Multiple Vitamin (MULTIVITAMIN) tablet Take 1 tablet by mouth daily.     Potassium 99 MG TABS Take 99 mg by mouth  daily.     Povidone, PF, (IVIZIA DRY EYES) 0.5 % SOLN Place 1 drop into both eyes at bedtime.     predniSONE (DELTASONE) 10 MG tablet 4 x 1 day, 3 x 1 day, 2 x 1 day, 1 x 1 day then 1/2 x 1 day and stop 11 tablet 0   SUMAtriptan (IMITREX) 50 MG tablet Take 50 mg by mouth every 2 (two) hours as needed for migraine.     triamcinolone ointment (KENALOG) 0.1 % Apply to left nare daily as needed.     No current facility-administered medications on file prior to visit.    ALLERGIES: Caine-1 [lidocaine], Indomethacin, Other, Articaine-epinephrine, Gabapentin, Hydrocodone, Latex, and Lamisil [terbinafine]  SH: Married, non-smoker  Review of Systems  Constitutional:  Positive for chills and weight loss.  Gastrointestinal:  Positive for nausea.    PHYSICAL EXAMINATION:    BP (!) 164/80 (BP Location: Left Arm, Patient Position: Sitting, Cuff Size: Normal)   Pulse 80   Ht 5\' 3"   (1.6 m)   Wt 105 lb 12.8 oz (48 kg)   LMP 12/14/1972   BMI 18.74 kg/m     General appearance: alert, cooperative and appears stated age Neck: no adenopathy, supple, symmetrical, trachea midline and thyroid normal to inspection and palpation CV:  Regular rate and rhythm Lungs:  clear to auscultation, no wheezes, rales or rhonchi, symmetric air entry Abdomen: soft, non-tender; bowel sounds normal; no masses,  no organomegaly Lymph:  no inguinal LAD noted   Chaperone, Mckinley Jewel, CMA, was present for exam.  Assessment/Plan: 1. Nausea (Primary) - Symptoms are nonspecific and exam is nonfocal.  Patient understands I may not be the best person for this evaluation but we can start with obtaining some blood work.  She is seeing her endocrinologist tomorrow. - Comprehensive metabolic panel with GFR - CBC with Differential/Platelet

## 2023-12-21 ENCOUNTER — Encounter (HOSPITAL_BASED_OUTPATIENT_CLINIC_OR_DEPARTMENT_OTHER): Payer: Self-pay | Admitting: Obstetrics & Gynecology

## 2023-12-21 DIAGNOSIS — F419 Anxiety disorder, unspecified: Secondary | ICD-10-CM | POA: Diagnosis not present

## 2023-12-21 DIAGNOSIS — R682 Dry mouth, unspecified: Secondary | ICD-10-CM | POA: Diagnosis not present

## 2023-12-21 DIAGNOSIS — M810 Age-related osteoporosis without current pathological fracture: Secondary | ICD-10-CM | POA: Diagnosis not present

## 2023-12-21 DIAGNOSIS — R195 Other fecal abnormalities: Secondary | ICD-10-CM | POA: Diagnosis not present

## 2023-12-21 DIAGNOSIS — D7589 Other specified diseases of blood and blood-forming organs: Secondary | ICD-10-CM | POA: Diagnosis not present

## 2023-12-21 DIAGNOSIS — R634 Abnormal weight loss: Secondary | ICD-10-CM | POA: Diagnosis not present

## 2023-12-21 DIAGNOSIS — D751 Secondary polycythemia: Secondary | ICD-10-CM | POA: Diagnosis not present

## 2023-12-21 DIAGNOSIS — R112 Nausea with vomiting, unspecified: Secondary | ICD-10-CM | POA: Diagnosis not present

## 2023-12-22 NOTE — Telephone Encounter (Signed)
 Called pt in response to MyChart message. Confirmed with patient that she is planning to follow up with her PCP regarding her current issue. Pt has appt with PCP on Monday.

## 2023-12-25 DIAGNOSIS — R112 Nausea with vomiting, unspecified: Secondary | ICD-10-CM | POA: Diagnosis not present

## 2023-12-25 DIAGNOSIS — R682 Dry mouth, unspecified: Secondary | ICD-10-CM | POA: Diagnosis not present

## 2023-12-25 DIAGNOSIS — D7589 Other specified diseases of blood and blood-forming organs: Secondary | ICD-10-CM | POA: Diagnosis not present

## 2023-12-25 DIAGNOSIS — R195 Other fecal abnormalities: Secondary | ICD-10-CM | POA: Diagnosis not present

## 2023-12-25 DIAGNOSIS — R634 Abnormal weight loss: Secondary | ICD-10-CM | POA: Diagnosis not present

## 2023-12-25 DIAGNOSIS — M810 Age-related osteoporosis without current pathological fracture: Secondary | ICD-10-CM | POA: Diagnosis not present

## 2023-12-25 DIAGNOSIS — R110 Nausea: Secondary | ICD-10-CM | POA: Diagnosis not present

## 2023-12-25 DIAGNOSIS — B370 Candidal stomatitis: Secondary | ICD-10-CM | POA: Diagnosis not present

## 2024-01-04 DIAGNOSIS — I2510 Atherosclerotic heart disease of native coronary artery without angina pectoris: Secondary | ICD-10-CM | POA: Diagnosis not present

## 2024-01-04 DIAGNOSIS — I10 Essential (primary) hypertension: Secondary | ICD-10-CM | POA: Diagnosis not present

## 2024-01-04 DIAGNOSIS — I700 Atherosclerosis of aorta: Secondary | ICD-10-CM | POA: Diagnosis not present

## 2024-01-04 DIAGNOSIS — M810 Age-related osteoporosis without current pathological fracture: Secondary | ICD-10-CM | POA: Diagnosis not present

## 2024-01-09 IMAGING — CT CT ANGIO ABDOMEN
3 of 9 series · 11 of 46 positions shown, 17 images · non-contrast
Comparison: CT abdomen dated August 13, 2012

CLINICAL DATA: Renal artery aneurysm

EXAM:
CT ANGIOGRAPHY ABDOMEN
TECHNIQUE: Multidetector CT imaging of the abdomen was performed using the
standard protocol during bolus administration of intravenous
contrast. Multiplanar reconstructed images and MIPs were obtained
and reviewed to evaluate the vascular anatomy.

[Series 4: axial arterial · axial · arterial · 0.60mm/px · z∈[-171,-97]mm · 4 of 107 slices shown]
[im 13/107  soft-tissue]
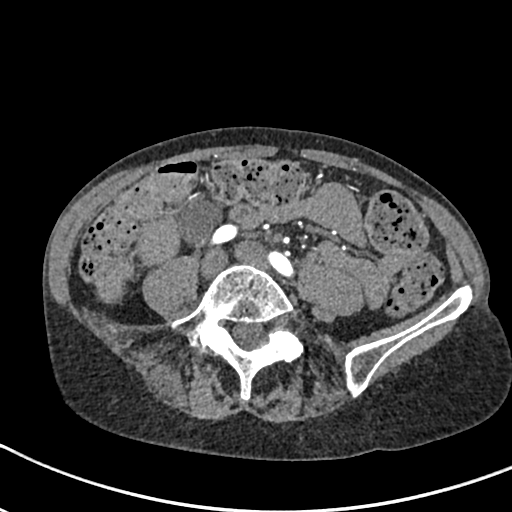
[im 25/107  soft-tissue]
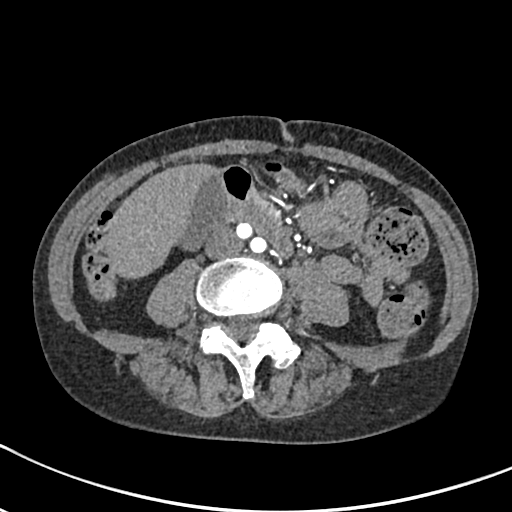
[im 38/107  soft-tissue]
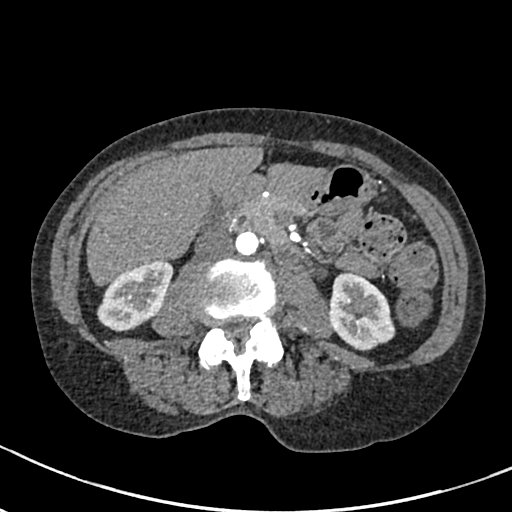
[im 50/107  soft-tissue]
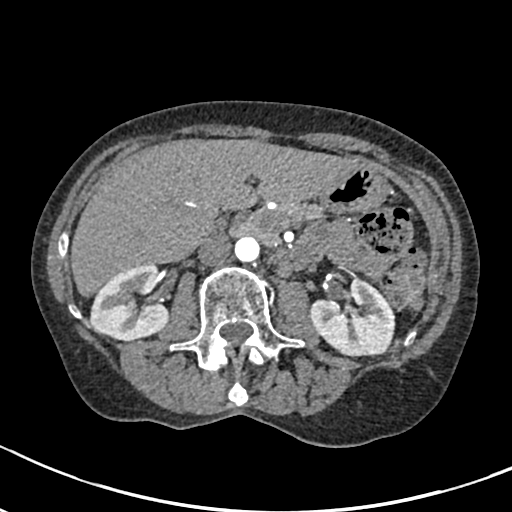

[Series 7: cor soft art · coronal · 0.41mm/px · 2 of 116 slices shown, 3 images]
[im 39/116  soft-tissue]
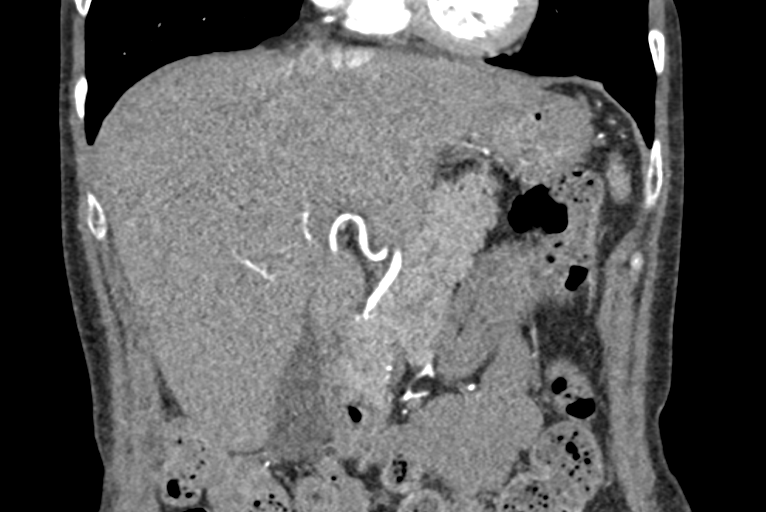
[im 39/116  bone]
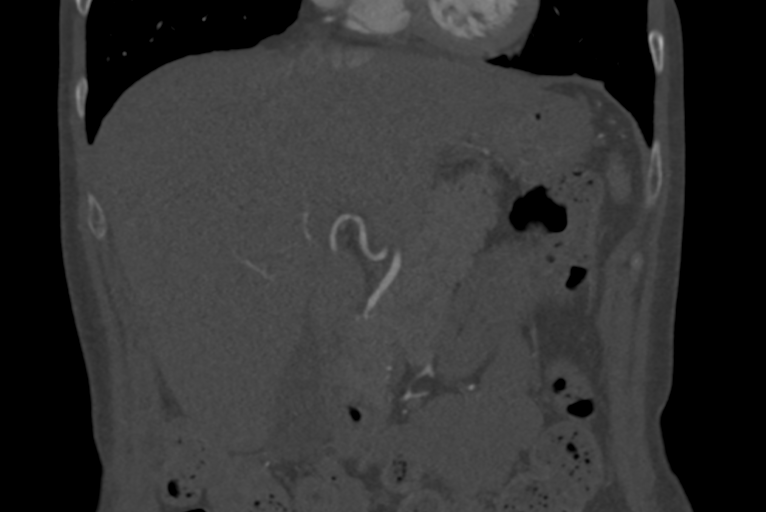
[im 77/116  soft-tissue]
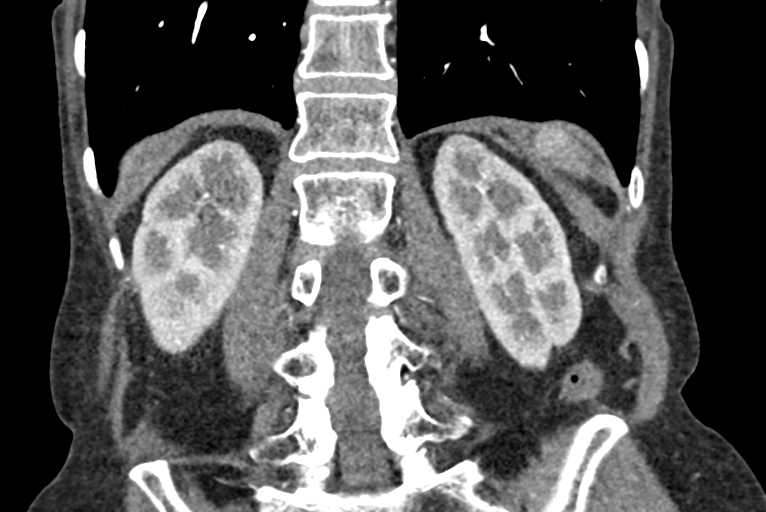

[Series 11: axial venous · axial · portal-venous · 0.60mm/px · z∈[-158,-18]mm · 5 of 43 slices shown, 10 images]
[im 8/43  soft-tissue]
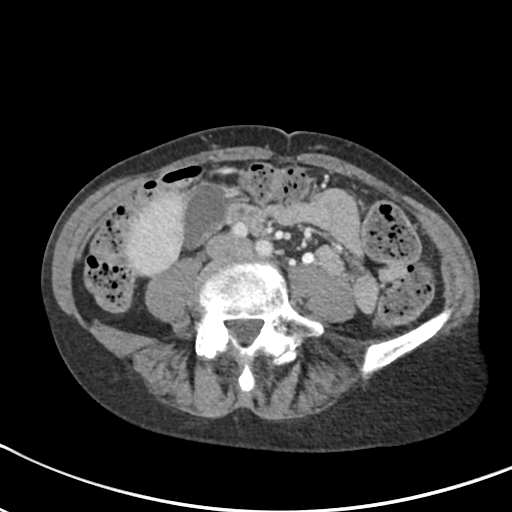
[im 8/43  bone]
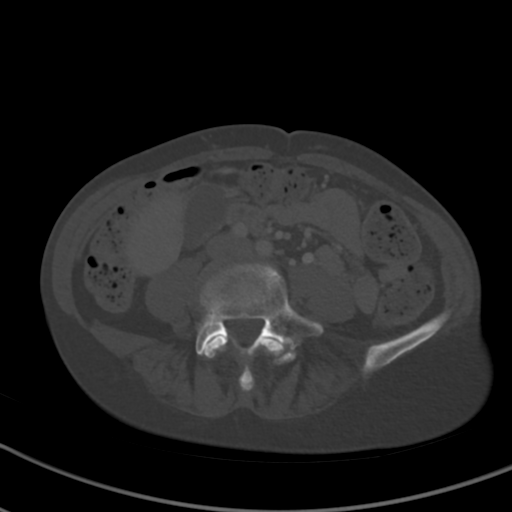
[im 15/43  soft-tissue]
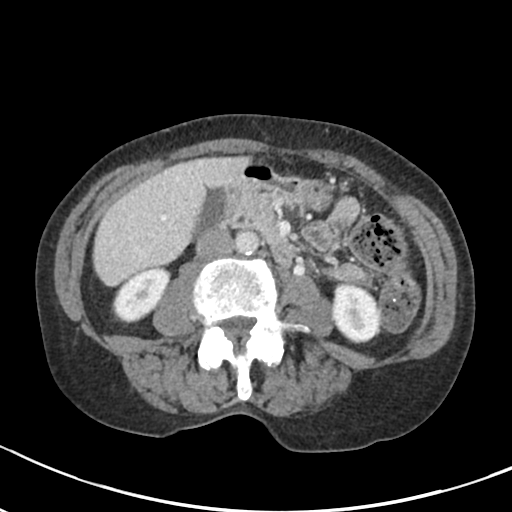
[im 15/43  lung]
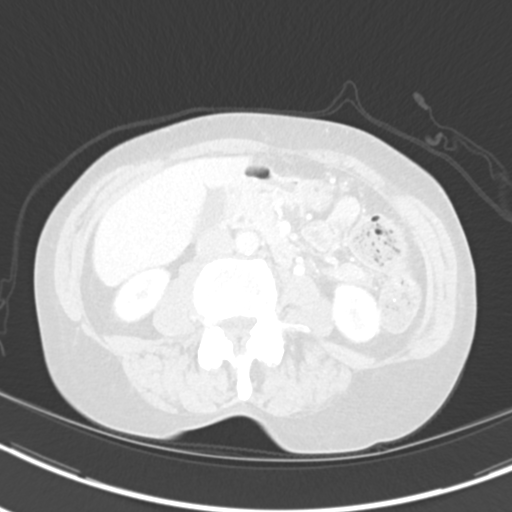
[im 22/43  soft-tissue]
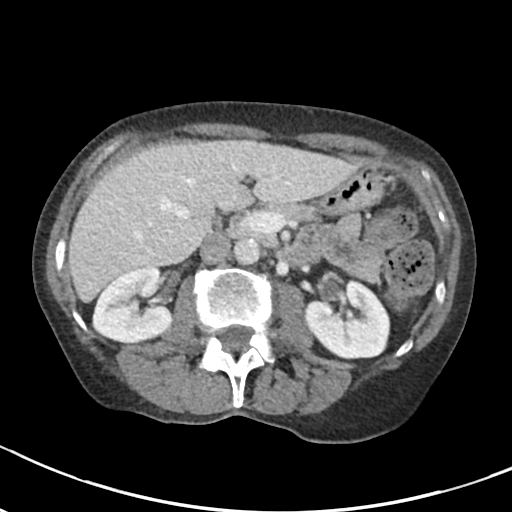
[im 22/43  lung]
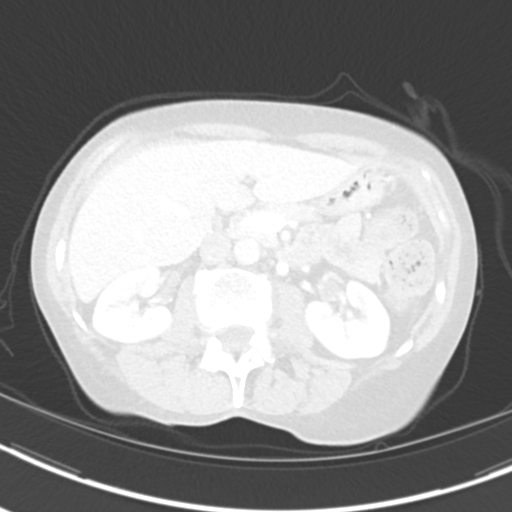
[im 29/43  soft-tissue]
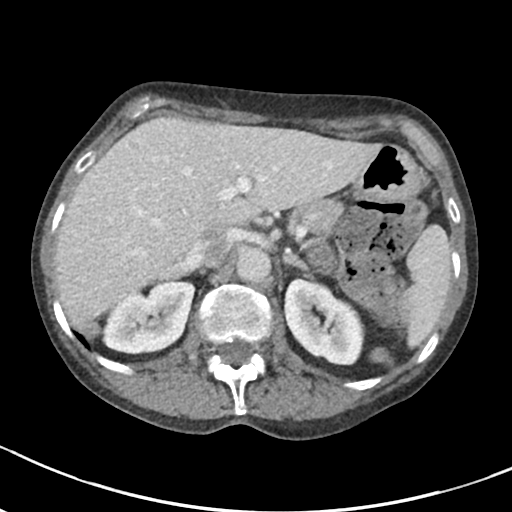
[im 29/43  lung]
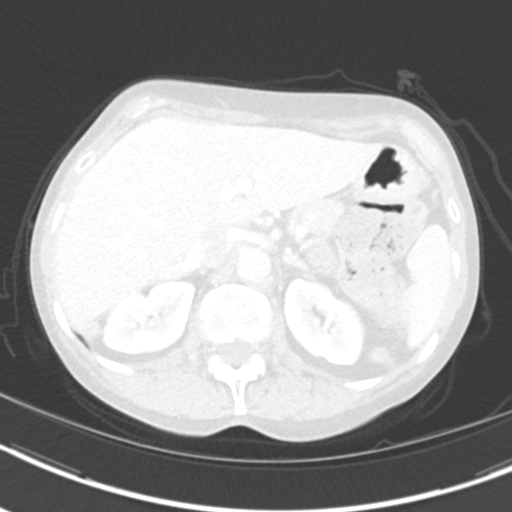
[im 36/43  soft-tissue]
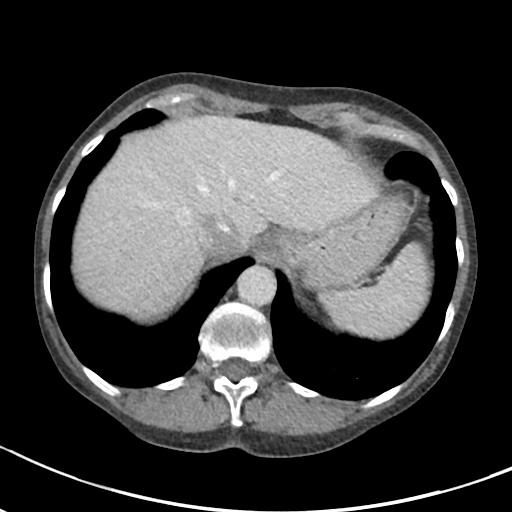
[im 36/43  lung]
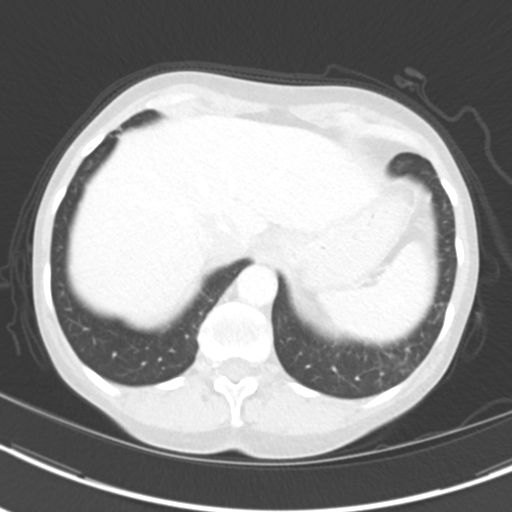

[11 of 46 positions shown; findings below may reference images not displayed]

RADIATION DOSE REDUCTION: This exam was performed according to the
departmental dose-optimization program which includes automated
exposure control, adjustment of the mA and/or kV according to
patient size and/or use of iterative reconstruction technique.

CONTRAST:  75mL OMNIPAQUE IOHEXOL 350 MG/ML SOLN
FINDINGS: VASCULAR

Aorta: Normal caliber abdominal aorta with mild calcified plaque and
no significant stenosis.

Celiac: Normal caliber celiac artery with no significant
atherosclerotic disease or stenosis.

SMA: Visualized SMA is normal in caliber with no significant
atherosclerotic disease or stenosis.

Renals: Peripherally calcified renal artery aneurysm arising from a
lower pole segmental branch measuring 1.1 x 1.1 x 1.3 cm, unchanged
in size when compared to prior exam. Bilateral main renal arteries
are normal in caliber with minimal calcified plaque and no stenosis.

IMA: Visualized portion is normal in caliber with no evidence of
stenosis.

Veins: No venous abnormality.

Review of the MIP images confirms the above findings.

NON-VASCULAR

Lower chest: Stable solid 4 mm nodule of the left lower lobe located
on image 13, likely benign. Bibasilar atelectasis.

Hepatobiliary: No focal liver abnormality is seen. No gallstones,
gallbladder wall thickening, or biliary dilatation.

Pancreas: Unremarkable. No pancreatic ductal dilatation or
surrounding inflammatory changes.

Spleen: Normal in size without focal abnormality.

Adrenals/Urinary Tract: Adrenal glands are unremarkable. Kidneys are
normal, without renal calculi, focal lesion, or hydronephrosis.
Bladder is unremarkable.

Stomach/Bowel: Visualized portions of the small and large bowel are
unremarkable.

Lymphatic: No pathologically enlarged lymph nodes seen in the
abdomen.

Other: No abdominal wall hernia or abdominal ascites.

Musculoskeletal: No acute or significant osseous findings.
IMPRESSION: 1. Stable right renal artery aneurysm, measuring up to 1.3 cm.
2.  Aortic Atherosclerosis ([id number]-608.8).

## 2024-01-10 DIAGNOSIS — E7800 Pure hypercholesterolemia, unspecified: Secondary | ICD-10-CM | POA: Diagnosis not present

## 2024-01-10 DIAGNOSIS — I700 Atherosclerosis of aorta: Secondary | ICD-10-CM | POA: Diagnosis not present

## 2024-01-10 DIAGNOSIS — I10 Essential (primary) hypertension: Secondary | ICD-10-CM | POA: Diagnosis not present

## 2024-01-10 DIAGNOSIS — I2510 Atherosclerotic heart disease of native coronary artery without angina pectoris: Secondary | ICD-10-CM | POA: Diagnosis not present

## 2024-01-10 DIAGNOSIS — F32A Depression, unspecified: Secondary | ICD-10-CM | POA: Diagnosis not present

## 2024-01-10 DIAGNOSIS — M8000XA Age-related osteoporosis with current pathological fracture, unspecified site, initial encounter for fracture: Secondary | ICD-10-CM | POA: Diagnosis not present

## 2024-01-31 DIAGNOSIS — R55 Syncope and collapse: Secondary | ICD-10-CM | POA: Diagnosis not present

## 2024-01-31 DIAGNOSIS — R682 Dry mouth, unspecified: Secondary | ICD-10-CM | POA: Diagnosis not present

## 2024-01-31 DIAGNOSIS — G4709 Other insomnia: Secondary | ICD-10-CM | POA: Diagnosis not present

## 2024-02-01 ENCOUNTER — Ambulatory Visit (INDEPENDENT_AMBULATORY_CARE_PROVIDER_SITE_OTHER): Admitting: Internal Medicine

## 2024-02-01 ENCOUNTER — Encounter: Payer: Self-pay | Admitting: Internal Medicine

## 2024-02-01 VITALS — BP 118/66 | HR 68 | Temp 97.6°F

## 2024-02-01 DIAGNOSIS — R059 Cough, unspecified: Secondary | ICD-10-CM

## 2024-02-01 DIAGNOSIS — A159 Respiratory tuberculosis unspecified: Secondary | ICD-10-CM

## 2024-02-01 DIAGNOSIS — J209 Acute bronchitis, unspecified: Secondary | ICD-10-CM

## 2024-02-01 DIAGNOSIS — Z87891 Personal history of nicotine dependence: Secondary | ICD-10-CM | POA: Diagnosis not present

## 2024-02-01 DIAGNOSIS — R918 Other nonspecific abnormal finding of lung field: Secondary | ICD-10-CM

## 2024-02-01 DIAGNOSIS — R55 Syncope and collapse: Secondary | ICD-10-CM

## 2024-02-01 MED ORDER — ALBUTEROL SULFATE HFA 108 (90 BASE) MCG/ACT IN AERS: 2.0000 | INHALATION_SPRAY | Freq: Four times a day (QID) | RESPIRATORY_TRACT | 2 refills | Status: AC | PRN

## 2024-02-01 NOTE — Patient Instructions (Addendum)
 ICD-10-CM   1. 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 Lung nodules  R91.8     3. Former heavy cigarette smoker (20-39 per day)  Z87.891     4. Acute bronchitis, unspecified organism  J20.9         Acute bronchitis  Plan  - Take Z Pak that you already have      MTB (Mycobacterium tuberculosis infection) Lung nodules Chronic cough Former heavy cigarette smoker (20-39 per day)  -Significantly improved and also CT scan of the chest November 2024 is hugely improved compared to April 2023 in January 2024.  Glad you finished antituberculous treatment but it appears the cough is back and we will need to understand why.  Plan - repeat CT chest November 2025.  - use albuterol  as needed   Travel advice encounter to [place] in [place] in May 2025 Syncope x 2 during trip  Plan - talk to PCP Elaine Feeling, MD about potential role for ZIO Monitor if applicable    Follow-up - Return in NOv 2025 to see DR Bertrum Brodie but after ct chest

## 2024-02-01 NOTE — Progress Notes (Signed)
 OV 08/18/2022   OV 11/09/2021  Subjective:  Patient ID: Elaine Navarro, female , DOB: 1946-05-20 , age 78 y.o. , MRN: [id number] , ADDRESS: [address] [place] [address] PCP Suzzette Eth, MD Patient Care Team: Suzzette Eth, MD as PCP - General (Family Medicine)  This Provider for this visit: Treatment Team:  Attending Provider: Maire Scot, MD    11/09/2021 -   Chief Complaint  Patient presents with   Consult    Pt had a CT performed which is the reason for today's visit.     HPI Elaine Navarro 78 y.o. -presents with her husband.  Concern is left lower lobe nodule 4 mm seen on CT angiogram abdomen done for renal artery aneurysm.  She sees physician at [facility] for renal artery aneurysm.  During this time she had a CT scan of the abdomen done with contrast here in [place].  This showed a 4 mm lung nodule in the left lower lobe.  According to Dr. Gradie Lawless it is stable over 10 years.  I personally visualized this.  Some bibasilar atelectasis also reported although I am not convinced about this.  Patient herself states that overall she is stable.  She says that in July 2022 she had COVID and after that she had significant cough.  Primary care treated with cough Tessalon Perles.  The cough is now resolved for the last 5 weeks.  Currently no undue shortness of breath.  No cough no wheezing no paroxysmal nocturnal dyspnea no orthopnea.  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  She is a remote 30 pack smoker having quit in 1984 when she moved from [place] to [place].  While in [place] as a child she got exposed to her both grandfather and aunt with tuberculosis.  She says she herself was skin test positive but denies any latent TB treatment.  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  But this was just general air pollution.  She is worried about the implications of all this for her health.  She says clinically her previous primary care physician told her she might have COPD.  She also is a brother with COPD.  She wants to be sure that there is no undue health issues with her lung.  She also wants to continue doing ballroom dancing at a championship level.    CT Chest data 10/21/21   Narrative & Impression  CLINICAL DATA:  Renal artery aneurysm   EXAM: CT ANGIOGRAPHY ABDOMEN   TECHNIQUE: Multidetector CT imaging of the abdomen was performed using the standard protocol during bolus administration of intravenous contrast. Multiplanar reconstructed images and MIPs were obtained and reviewed to evaluate the vascular anatomy.   RADIATION DOSE REDUCTION: This exam was performed according to the departmental dose-optimization program which includes automated exposure control, adjustment of the mA and/or kV according to patient size and/or use of iterative reconstruction technique.   CONTRAST:  75mL OMNIPAQUE  IOHEXOL  350 MG/ML SOLN   COMPARISON:  CT abdomen dated August 13, 2012   FINDINGS: VASCULAR   Aorta: Normal caliber abdominal aorta with mild calcified plaque and no significant stenosis.   Celiac: Normal caliber celiac artery with no significant atherosclerotic disease or stenosis.   SMA: Visualized SMA  is normal in caliber with no significant atherosclerotic disease or stenosis.   Renals: Peripherally calcified renal artery aneurysm arising from a lower pole segmental branch measuring 1.1 x 1.1 x 1.3 cm, unchanged in size when compared to prior exam. Bilateral main renal arteries are normal in caliber with minimal calcified plaque and no stenosis.   IMA: Visualized portion is normal in caliber with no evidence of stenosis.   Veins: No venous abnormality.   Review of the MIP images confirms the above  findings.   NON-VASCULAR   Lower chest: Stable solid 4 mm nodule of the left lower lobe located on image 13, likely benign. Bibasilar atelectasis.   Hepatobiliary: No focal liver abnormality is seen. No gallstones, gallbladder wall thickening, or biliary dilatation.   Pancreas: Unremarkable. No pancreatic ductal dilatation or surrounding inflammatory changes.   Spleen: Normal in size without focal abnormality.   Adrenals/Urinary Tract: Adrenal glands are unremarkable. Kidneys are normal, without renal calculi, focal lesion, or hydronephrosis. Bladder is unremarkable.   Stomach/Bowel: Visualized portions of the small and large bowel are unremarkable.   Lymphatic: No pathologically enlarged lymph nodes seen in the abdomen.   Other: No abdominal wall hernia or abdominal ascites.   Musculoskeletal: No acute or significant osseous findings.   IMPRESSION: 1. 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 2.  Aortic Atherosclerosis (ICD10-I70.0).     Electronically Signed   By: Avelino Lek M.D.   On: 10/22/2021 10:03       12/28/2021 Follow up : Lung nodule  Patient returns for a 55-month follow-up.  Patient was seen for pulmonary consult November 09, 2021 for abnormal CT chest that showed 4 mm lung nodule.  Patient also has some mild shortness of breath with activities.  Has a family history of TB and COPD.  She is a former smoker.  Patient was set up for a high-resolution CT chest that showed no enlarged lymph nodes.  A 0.4 cm nodule in the left lower lobe that is stable compared back to 2012.  Consistent with a benign etiology.  Clustered nodularity and consolidation in the right apex with associated parenchymal scarring as well in the left upper lobe.  Findings are consistent with possible atypical infection such as atypical Mycobacterium.  Alpha-1 testing was normal level and phenotype.  Patient says she has very minimum cough if any.  Shortness of breath is  very mild in nature.  Patient was set up for pulmonary function testing that was normal that showed FEV1 at 125%, ratio 80, FVC 117%.,  No significant bronchodilator response, DLCO 82%. She says that she gets bronchitis once or twice a year.  Usually resolves pretty easily.  She says most days she has no cough at all.  Is not limited by activities.  She did have COVID-19 in July 2022. Patient does have colitis and has issues with weight loss at times.  Says occasionally she has some swallow issues.  She denies any hemoptysis, discolored mucus, fever.   IMPRESSION: 1. Small pulmonary nodules of the dependent left lower lobe are stable on examinations dating back to at least 09/15/2010 and definitively benign. No further follow-up or characterization is indicated for these nodules. 2. Clustered centrilobular nodularity and consolidation of the medial right pulmonary apex with some associated pleuroparenchymal scarring, as well as of the peripheral posterior left upper lobe. 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 These findings are new in comparison to remote prior CT of the chest dated  09/15/2010.   Aortic Atherosclerosis (ICD10-I70.0).     Electronically Signed   By: Fredricka Jenny M.D.   On: 12/17/2021 13:00     06/30/2022 Patient presents today for acute office visit. She has a history of pulmonary nodules.  She had a CT in April 2023 that showed findings atypical mycobacterium. She has had a cough since August.  Primary care prescribed rifampin, ethambutol and azithromycin  recently d/t her symptoms and image findings. She developed white tongue and sore throat after taking medication for 3 days. She did notice improvement in cough while on abx. She has had no sputum cultures. She has never had a bronchoscopy. No recent chest imaging. She plans to travel to [place] on 07/09/22. No significant shortness of breath except with coughing fits.  Cough can be productive with white-yellow phlegm. She is afebrile.    Subjective:  Patient ID: Elaine Navarro, female , DOB: 1946-02-21 , age 76 y.o. , MRN: [id number] , ADDRESS: [address] [place] [address] PCP Tena Feeling, MD Patient Care Team: Tena Feeling, MD as PCP - General (Internal Medicine)  This Provider for this visit: Treatment Team:  Attending Provider: Maire Scot, MD    08/18/2022 -   Chief Complaint  Patient presents with   Follow-up    Pt states she is still coughing and also has chest congestion as well as tightness in chest. States her cough is worse in the evening and states she is coughing up white phlegm.     HPI Elaine Navarro 78 y.o. -returns for follow-up.  Presents with her husband.  I saw her earlier in the year in 2023.  After that she has had visits with the nurse practitioner.  She tells me that the cough that started in the summer 2022 after COVID still persist.  After I saw her I ordered CT scan of the chest that showed nodularity that was clustered.  Her primary care physician empirically prescribed a triple antibiotic therapy for MAI infection.  However she developed a coated tongue.  Also given the empiric nature of the diagnosis we recommended she stop it.  Which she did.  After that in October 2023 she saw a Publishing rights manager.  Sputum culture grew haemophilus influenza and Candida.  She was given Z-Pak.  She did leave for her trip to [place] on 07/09/2022 but the trip got changed because of the [place] war.  Instead she went to [place] [employer] and from there it was a [place] cruise from 07/11/2022.  She travel to the [place] of [place] in [place].  She went to [place] she went to [place] and she came back to [place]  via [place] on 07/24/2022.  Shortly after arriving she got COVID-19 for the second time.  She believes she contracted this in the flight.  During the trip her cough is significantly  worse rated at 8 out of 9.  The Z-Pak that she was prescribed she took it at that time and it did help but currently she is back with residual cough at the level 5 out of 10.  Chest feels tight.  There is occasional white stuff.  Particularly sputum.  She is quite miserable with the symptoms.  She also has a runny nose for the last 5 weeks.  She wants her symptoms addressed.  Labs show normal QuantiFERON gold but no allergy  workup. Alpha-1 antitrypsin is normal. She is a former 30 pack smoking history.   Of note for this  visit I personally visualized the CT scan with the previous CT scan from April 2023.     OV 10/11/2022  Subjective:  Patient ID: Elaine Navarro, female , DOB: 1945/10/10 , age 68 y.o. , MRN: [id number] , ADDRESS: [address] [place] [facility] [address] PCP Tena Feeling, MD Patient Care Team: Tena Feeling, MD as PCP - General (Internal Medicine)  This Provider for this visit: Treatment Team:  Attending Provider: Maire Scot, MD    10/11/2022 -   Chief Complaint  Patient presents with   Follow-up    Cough is improving. She has not started her spiriva  due to high cost. Her cough is occ prod with white sputum. Appetite is good. No fevers, sweats.     HPI Elaine Navarro 78 y.o. -returns for follow-up.  At this point in time under the coordination of Department of Public Health she is completed 2 weeks of Mycobacterium tuberculosis treatment and date labs of therapy.  She already states that her cough is improved significantly her chest tightness is improved significantly although residual symptoms are still there.  She is feeling better she is tolerating the antituberculous treatment well.  She is no longer considered contagious and has been allowed to go in public according to her history.  Her husband is with her but he is not providing any independent history today.  The test sputum culture was done by nurse practitioner in October 2023.  Right  prior to starting the antituberculous treatment we did a CT scan of the chest and a pulmonary right upper lobe nodules were actually getting worse.  A bronchoscopy was done early January 2024 and the nucleocapsid amplification of Mycobacterium tuberculosis negative but the culture is still pending and so far no growth.  Nevertheless she is feeling better.  Of note she had some mild emphysema on the CT scan we ordered Spiriva  but it is too expensive and she is feeling better so we told her to hold off.   Her current response to typical treatment and terms of cough is profile below     CT cest 09/21/22   Narrative & Impression  CLINICAL DATA:  78 year old female presents for evaluation of chronic productive cough.   EXAM: CT CHEST WITHOUT CONTRAST   TECHNIQUE: Multidetector CT imaging of the chest was performed following the standard protocol without IV contrast.   RADIATION DOSE REDUCTION: This exam was performed according to the departmental dose-optimization program which includes automated exposure control, adjustment of the mA and/or kV according to patient size and/or use of iterative reconstruction technique.   COMPARISON:  December 17, 2021   FINDINGS: Cardiovascular: Calcified aortic atherosclerosis is mild. Normal caliber of the thoracic aorta. Normal heart size. Scattered coronary artery calcifications. Normal caliber of central pulmonary vasculature.   Mediastinum/Nodes: LEFT thyroid  nodule up to 2.3 cm with areas of calcification. No adenopathy in the chest. Esophagus is grossly normal by CT.   Lungs/Pleura: Biapical scarring. More confluent area of nodularity in the RIGHT upper lobe has increased considerably since previous imaging 3.0 x 1.3 cm. Previously this was confined to an area along the medial and lateral surface of the apical pleural surfaces.   There are diffuse areas of nodularity in the RIGHT upper lobe metabolic so increased, some areas new since  previous imaging. Juxtapleural nodule in the anterior RIGHT upper lobe (image 36/5) 8 mm, this is a new finding. Grouped nodules in the RIGHT upper lobe and some tree in bud on images  36 through 41 are also new.   Some improvement with respect to bronchovascular nodularity extending towards the RIGHT lung apex that was seen in the medial RIGHT upper lobe on the previous study. No signs of additional consolidative process or evidence of pleural effusion. Mild LEFT apical scarring.   Stable LEFT lower lobe nodule (image 132/5) 4 mm. Airways are patent. No consolidation or sign of pleural effusion.   Upper Abdomen: No acute upper abdominal findings to the extent evaluated.   Musculoskeletal: No chest wall mass.  Spinal degenerative changes.   IMPRESSION: 1. Resolution of some areas of nodularity with worsening of peripheral areas of nodularity in the RIGHT upper lobe. Findings favor sequela of infection perhaps MAI or atypical infection. 2. More confluent area of nodularity also having developed since previous imaging. Would suggest short interval follow-up to ensure that this area shows improvement and does not show persistent underlying masslike area or nodule. Would also consider correlation with respiratory symptoms and pulmonology consultation if not yet obtained. 3. LEFT thyroid  nodule up to 2.3 cm with areas of calcification. Suggest follow-up thyroid  ultrasound if not yet obtained. 4. Aortic atherosclerosis and coronary artery disease.   Aortic Atherosclerosis (ICD10-I70.0).     Electronically Signed   By: Zara Heymann M.D.   On: 09/22/2022 14:09     OV 01/17/2023  Subjective:  Patient ID: Elaine Navarro, female , DOB: [birth year] , age 27 y.o. , MRN: [id number] , ADDRESS: [address] [place] [address] PCP Tena Feeling, MD Patient Care Team: Tena Feeling, MD as PCP - General (Internal Medicine)  This Provider for this visit: Treatment Team:   Attending Provider: Maire Scot, MD  #Former heavy smoker 30 pack - #Right upper lobe nodule April 2023 worse in January 2024  -Sputum culture + October 2023 for Mycobacterium tuberculosis and also haemophilus  -Multiple QuantiFERON gold negative  -exposed to tuberculosis as an infant from her grandfather in the 1940s  01/17/2023 -   Chief Complaint  Patient presents with   Follow-up    CT F/up     HPI Elaine Navarro 78 y.o. -returns for follow-up.  Presents with her husband was an independent historian.  She now reports that her cough is even better with continued antituberculous treatment but beginning December 20, 2022 while on antituberculous treatment with close monitoring by the Department of Public Health she started having transaminitis.  Her AST went up to 84 and ALT went up to 108.  Then on Jan 13, 2023 AST was 114 and ALT was 144 and then on Jan 15, 2023 the antituberculous treatment was stopped.  She is not having any jaundice or discolored urine.  She has gained a little weight.  She says the cough is actually better.  She says she is getting good support from the [employer].  However she is worried about drug-induced liver injury.  Husband states she is really worried about this.  She is reading things on the Internet.  She says the public health department might switch her to another antituberculous treatment.  They are also requesting a right upper quadrant ultrasound.  She had CT scan of the chest May 2025 personally visualized it.  I am not fully sure if she is significantly improved or not compared to a year ago.  I asked for repeat read.  I did show her the CT report.  I also showed her the CT images.  In addition she did have  some vaginal yeast infection a month ago and took Diflucan pessary but now the vaginal itch is back.  Started having some pain in the right low back for the last 1 week.  She is got some dry mouth for the last 1 month.  She will  discuss this with primary care physician.       CT chest 01/13/23  Narrative & Impression  CLINICAL DATA:  Chronic cough, former smoker   EXAM: CT CHEST WITHOUT CONTRAST   TECHNIQUE: Multidetector CT imaging of the chest was performed following the standard protocol without IV contrast.   RADIATION DOSE REDUCTION: This exam was performed according to the departmental dose-optimization program which includes automated exposure control, adjustment of the mA and/or kV according to patient size and/or use of iterative reconstruction technique.   COMPARISON:  CT chest, 09/21/2022 thyroid  ultrasound, 05/12/2022   FINDINGS: Cardiovascular: Aortic atherosclerosis. Normal heart size. Left and right coronary artery calcifications. No pericardial effusion.   Mediastinum/Nodes: No enlarged mediastinal, hilar, or axillary lymph nodes. Thyroid  gland, trachea, and esophagus demonstrate no significant findings.   Lungs/Pleura: No significant change in an area of consolidation and volume loss of the right pulmonary apex (series 8, image 5). Adjacent clustered nodularity and small consolidations of overall improved, however are fluctuant, with a new nodule medially measuring 0.5 cm (series 8, image 22). Additional, occasional small nodules and ground-glass opacities elsewhere, for example a 0.4 cm nodule of the dependent left lower lobe, are unchanged (series 8, image 124). No pleural effusion or pneumothorax.   Upper Abdomen: No acute abnormality. Densely rim calcified aneurysm of the right renal artery or a branch arteriole measuring 1.3 x 1.2 cm (series 2, image 156).   Musculoskeletal: No chest wall abnormality. No acute osseous findings.   IMPRESSION: 1. No significant change in an area of consolidation and volume loss of the right pulmonary apex. Adjacent clustered nodularity and small consolidations of overall improved, however fluctuant. Findings are consistent with ongoing  atypical infection and chronic underlying sequelae, particularly atypical Mycobacterium. 2. Additional, occasional small nodules and ground-glass opacities elsewhere unchanged. 3. Incidental note of a densely rim calcified aneurysm of the right renal artery or a branch arteriole measuring 1.3 x 1.2 cm. 4. Coronary artery disease.   Aortic Atherosclerosis (ICD10-I70.0).     Electronically Signed   By: Fredricka Jenny M.D.   On: 01/17/2023 12:18    OV 05/02/2023  Subjective:  Patient ID: Elaine Navarro, female , DOB: October 28, 1945 , age 81 y.o. , MRN: [id number] , ADDRESS: [address] [place] [address] PCP Tena Feeling, MD Patient Care Team: Tena Feeling, MD as PCP - General (Internal Medicine) Elmyra Haggard, MD as PCP - Cardiology (Cardiology)  This Provider for this visit: Treatment Team:  Attending Provider: Maire Scot, MD    05/02/2023 -   Chief Complaint  Patient presents with   Follow-up    F/up on TB exposure     HPI Elaine Navarro 78 y.o. -followup MTB/ Discussed in cased conference June 2024: Jan 13, 2023 and Sep 21, 2022 -> [place] and waingin CT but there are areas of improvement but also new areas of nodulairty.Overall slightly better.  Has also seen ID Dr Cephas Collier - he supports Dx of MTB/ Then 04/10/23 saw cardiology: reported improvement in DOE. Co Caslcium was 42nd percentile.  She presents now with her husband for follow-up.  She says her cough is completely resolved.  She is feeling great.  Her weight is good.  No shortness of breath no wheezing.  She completed direct observed therapy by the public health department on Friday, 04/28/2023.  She said they did a chest x-ray was clear.  She wants to have a CT scan of the chest.  We discussed that our case conference and I shared these findings with her.  She and her husband wanted know about travel history.  I did tell them she is free to travel.  They are going to go to [place] in [place] in May  2025 for their 60th wedding anniversary.  She also was asking about travel advice particularly masking around planes.  We went over this.  I did advise her to mask in airports and planes and in any indoor cluster.  Did advise her to be up-to-date with all her vaccines.  Did indicate that I will give her prednisone  Paxlovid  and antibiotic for travel to [place].  We went over her pneumonia vaccination history.  She is opted to take the Prevnar 20 today.  She will have flu shot and COVID shot on her own this fall 2024.     reatment started 09/21/2022 ([facility]) Has been on consolidation with RI Lft rising to 150s and tx hold 5/06   5/02 cbc 4.7/14/237; lft 114/144/94/0.4 (ast/alt/alkphos/tbili) 5/14 lft 90/123/87/0.5    OV 07/31/2023  Subjective:  Patient ID: Elaine Navarro, female , DOB: 1946-03-14 , age 78 y.o. , MRN: [id number] , ADDRESS: [address] [place] [address] PCP Tena Feeling, MD Patient Care Team: Tena Feeling, MD as PCP - General (Internal Medicine) Elmyra Haggard, MD as PCP - Cardiology (Cardiology)  This Provider for this visit: Treatment Team:  Attending Provider: Maire Scot, MD    07/31/2023 -   Chief Complaint  Patient presents with   Follow-up    Ct f/u 11/1, pt states she has questions      HPI Elaine Navarro 78 y.o. -returns for follow-up.  Presents with her husband.  History is provided mostly by her but also review of the external medical record and to some extent the husband is an independent historian.After the last visit she was doing well.  However after coming of antituberculous treatment in August 2024 she felt she had a recurrence of her GI issues.  She is complaining of cramps particular in the suprapubic area along with diarrhea and bloating and abdominal pain.  She was evaluated by Dr. Lajuan Pila at [facility].  I reviewed his notes.  Initially seen 06/19/2023.  By July 07, 2023 C. difficile antigen was  positive.  She took p.o. vancomycin.  She showed me those results.  Diarrhea got better she took p.o. vancomycin for 10 days.  But today the diarrhea is back and the cramps are beginning to come back.  She is worried the C. difficile is back.  She plans to call Dr. Arlys Berke today.  From  From a respiratory standpoint she is doing well.  She has very mild cough.  She is completed antituberculous treatment.  Weight is holding.  Cough score as below.  She had CT scan of the chest November 2024 and I compared the nodules compared to January 2024 and also April 2023.  In my personal visualization opinion these nodules are significantly improved.  There are some residual bronchiectasis.  I showed this to her.  Radiology feels it is stable but I feel it is improved.  She has travel coming up to [place]  and [place] for her [place] Hight wedding anniversary in May 2025.  She and I agreed for her to do a pretravel visit in March 2025.        CT Chest data from date: yes  - personally visualized and independently interpreted : yes - my findings are: improved nodules arrative & Impression  CLINICAL DATA:  79 year old female with history of pulmonary nodules. History of mycobacterium tuberculosis exposure.   EXAM: CT CHEST WITHOUT CONTRAST   TECHNIQUE: Multidetector CT imaging of the chest was performed following the standard protocol without IV contrast.   RADIATION DOSE REDUCTION: This exam was performed according to the departmental dose-optimization program which includes automated exposure control, adjustment of the mA and/or kV according to patient size and/or use of iterative reconstruction technique.   COMPARISON:  Cardiac CT 03/02/2023.  Chest CT 01/13/2023.   FINDINGS: Cardiovascular: Heart size is normal. There is no significant pericardial fluid, thickening or pericardial calcification. There is aortic atherosclerosis, as well as atherosclerosis of the great vessels of the mediastinum and the  coronary arteries, including calcified atherosclerotic plaque in the left anterior descending and right coronary arteries.   Mediastinum/Nodes: No pathologically enlarged mediastinal or hilar lymph nodes. Please note that accurate exclusion of hilar adenopathy is limited on noncontrast CT scans. Esophagus is unremarkable in appearance. No axillary lymphadenopathy.   Lungs/Pleura: Pleural-parenchymal thickening and areas of architectural distortion and nodularity again noted, most evident in the lung apices, very similar to the prior study. A few other scattered areas of mild cylindrical bronchiectasis and peripheral micro nodularity are noted elsewhere in the lungs, also similar to the prior study, most evident throughout the upper lobes of the lungs, generally measuring 4 mm or less in size. No definite new suspicious appearing pulmonary nodules or masses are noted. No acute consolidative airspace disease. No pleural effusions.   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 Atherosclerotic calcifications in the abdominal aorta.   Musculoskeletal: There are no aggressive appearing lytic or blastic lesions noted in the visualized portions of the skeleton.   IMPRESSION: 1. 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 No new or acute findings are noted. 2. Aortic atherosclerosis, in addition to two-vessel coronary artery disease. Please note that although the presence of coronary artery calcium  documents the presence of coronary artery disease, the severity of this disease and any potential stenosis cannot be assessed on this non-gated CT examination. Assessment for potential risk factor modification, dietary therapy or pharmacologic  therapy may be warranted, if clinically indicated. 3. 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 This is unchanged. Follow-up nonemergent CTA of the abdomen is suggested in the near future to provide definitive characterization and establish a baseline for future follow-up examinations.   Aortic Atherosclerosis (ICD10-I70.0).     Electronically Signed   By: Alexandria Angel M.D.   On: 07/28/2023 08:54      OV 11/03/2023  Subjective:  Patient ID: Elaine Navarro, female , DOB: 09-22-1945 , age 39 y.o. , MRN: [id number] , ADDRESS: [address] [place] [address] PCP Tena Feeling, MD Patient Care Team: Tena Feeling, MD as PCP - General (Internal Medicine) Elmyra Haggard, MD as PCP - Cardiology (Cardiology)  This Provider for  this visit: Treatment Team:  Attending Provider: Maire Scot, MD  Type of visit: Video Virtual Visit Identification of patient Elaine Navarro with 02-Feb-1946 and MRN [id number] - 2 person identifier Risks: Risks, benefits, limitations of telephone visit explained. Patient understood and verbalized agreement to proceed Anyone else on call: just him + husband on side Patient location: her home This provider location: [address], [address]; [place]; [place] [address]. [facility]. 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 - C Ciff resolved but having colitis an taking budesonide  and is on 90 day Rx. GOing to [place]. Having some cough. Very mild mucus only. Clear mucuh. Cough severity is 1-2 of 10 only. In May 2025: going to [place] -> will make a visit to get travel advice from pulmonary perspective.  OF note, she is going to start PROLIA  for OP - conc\ern is infection risk esp pumonary. 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   D/w Geraldene Kleine our pharmacist - slight general increase infection risk but if no active infection no contraindication . Showed her case control data and also [employer].    OV 11/20/2023  Subjective:  Patient ID: Elaine Navarro, female , DOB: 08/14/1946 , age 34 y.o. , MRN: [id number] , ADDRESS: [address] [place] [address] PCP Tena Feeling, MD Patient Care Team: Tena Feeling, MD as PCP - General (Internal Medicine) Elmyra Haggard, MD as PCP - Cardiology (Cardiology)  This Provider for this visit: Treatment Team:  Attending Provider: Maire Scot, MD    11/20/2023 -   Chief Complaint  Patient presents with   Follow-up    Leaving for [place] end of April 2025- requesting meds to take with her in case she gets sick. She has occ cough with white sputum.      HPI Elaine Navarro 78 y.o. -returns for follow-up.  Presents with her husband.  At this point in time she tells me that since Christmas 2024 she is having a mild dry cough occasional white sputum but otherwise doing well.  The cough itself is stable.  She is not having any fever or chills.  She is finished her tuberculosis treatment.  She is willing to get a chest x-ray today.  Of note she is going to go to [place] for [place] and [place] trip.  The trip is going to be end of April 2025.  She wants medications.  We discussed Paxlovid  in case she gets COVID.  We discussed Z-Pak and prednisone  in case as respiratory exacerbations of bronchitis.  We discussed cephalexin  for possible cellulitis.  A 1 year scan is due as part of her smoking history and tuberculosis follow-up in November 2025.  Although she rated there is a very mild cough on the RSI cough score the cough score is 16.   Patient ID: Elaine Navarro, female , DOB: 14-Nov-1945 , age 45 y.o. , MRN: [id number] , ADDRESS: [address] [place] [address] PCP Tena Feeling, MD Patient Care Team: Tena Feeling, MD as PCP - General (Internal Medicine) Elmyra Haggard, MD as PCP - Cardiology (Cardiology)  This Provider for this visit: Treatment Team:  Attending Provider: Maire Scot, MD    02/01/2024 -   Chief Complaint  Patient presents with   Follow-up  Increased cough over the past wk- slightly prod with clear to light yellow sputum. She has minimal SOB.      HPI Elaine Navarro 78 y.o.  - ACUTE Visit.  Presents with her husband.  She just returned from [place] last week on Wednesday, Jan 24, 2024.  She says several people in her trip to [place] felt sick with a cough.  She also got a cough upon return.  This yellow sputum but she feels she is getting better.  She is taking Mucinex.  It is associated with significant fatigue.  However in [place] she was walking 3 to 4 miles per day and 110 Fahrenheit heat.  There is no fever or chills or rigors.  No mosquito bites.  She does have Z-Pak that she took with her for travel but she is not taking this there is no wheezing.  Outside of this during the trip she had syncope.  She had her first episode of syncope/30/25 while on the plane to be.  She room was feeling dizzy and slowly passing out.  No injuries no bladder or bowel incontinence.  No seizures.  She had a second 1 a few days later in a hotel in [place].  Both she recovered spontaneously none since then.  There are some dry mouth she had at that time.  No chest pains.  Apparently PCP is reassured her.  I asked the patient to discuss with PCP about potential role for ZIO monitor  Her neck scheduled visit is in November 2025 with CT chest.    Dr Mitchel An Reflux Symptom Index (> 13-15 suggestive of LPR cough) Pre RX  - ATb Rx Post start ATbx Rx - 2 weeks 07/31/2023 Completed ATT by Dept of Public health 11/20/2023 Mild cough back since Christmas 2024  Hoarseness of problem with voice 5 2 0 3  Clearing  Of Throat 5 3 0 3  Excess throat mucus or feeling of post nasal drip 5 3 1 3   Difficulty  swallowing food, liquid or tablets 3 2 0 0  Cough after eating or lying down 5 3 0 0  Breathing difficulties or choking episodes 4 2 0 0  Troublesome or annoying cough 5 3 0 3  Sensation of something sticking in throat or lump in throat 5 2 0 3  Heartburn, chest pain, indigestion, or stomach acid coming up 3 2 0 1  TOTAL 40 21 0 16     Cultures: 1/02 afb sputum -- few mtb (S inh mic 0.1, rifampin mic 1, ethambutol mic 5; pza not done as not available currently) 2/14 sputum cx -- few mtb 4/03 sputum cx -- ngtd 4/04 sputum cx -- ngtd 4/17 sputum cx -- ngtd 4/18 sputum cx -- ngtd       PFT     Latest Ref Rng & Units 12/28/2021   10:42 AM  PFT Results  FVC-Pre L 3.18   FVC-Predicted Pre % 121   FVC-Post L 3.07   FVC-Predicted Post % 117   Pre FEV1/FVC % % 72   Post FEV1/FCV % % 80   FEV1-Pre L 2.29   FEV1-Predicted Pre % 117   FEV1-Post L 2.44   DLCO uncorrected ml/min/mmHg 15.17   DLCO UNC% % 82   DLCO corrected ml/min/mmHg 15.17   DLCO COR %Predicted % 82   DLVA Predicted % 72   TLC L 4.40   TLC % Predicted % 89   RV % Predicted % 64  LAB RESULTS last 96 hours No results found.       has a past medical history of Aneurysm of renal artery (HCC), Fracture of right wrist (2011), GERD (gastroesophageal reflux disease), Hypertension, Left bundle branch block, Left thyroid  nodule (2003), Lymphocytic colitis (03/2013), Migraines, Osteoporosis, PONV (postoperative nausea and vomiting), Sciatica, and Visceral injury.   reports that she quit smoking about 41 years ago. Her smoking use included cigarettes. She started smoking about 51 years ago. She has a 30 pack-year smoking history. She has never used smokeless tobacco.  Past Surgical History:  Procedure Laterality Date   ABDOMINOPLASTY  2010   APPENDECTOMY  1962   BREAST EXCISIONAL BIOPSY Bilateral 2001   x 3   BRONCHIAL WASHINGS  09/16/2022   Procedure: BRONCHIAL WASHINGS;  Surgeon: Maire Scot, MD;   Location: [facility] ENDOSCOPY;  Service: Endoscopy;;   CATARACT EXTRACTION, BILATERAL Bilateral 01/2020   Dental implants  1993   HEMORRHOID SURGERY  2000   JOINT REPLACEMENT     KNEE ARTHROSCOPY W/ DEBRIDEMENT Right 07/2020   Dr. Murrell Arrant   TOTAL KNEE ARTHROPLASTY Right 01/21/2022   Procedure: RIGHT TOTAL KNEE ARTHROPLASTY;  Surgeon: Arnie Lao, MD;  Location: [facility] ORS;  Service: Orthopedics;  Laterality: Right;  RFNA   VAGINAL HYSTERECTOMY  1974   Dysfunctional uterine bleeding.   VIDEO BRONCHOSCOPY N/A 09/16/2022   Procedure: VIDEO BRONCHOSCOPY WITHOUT FLUORO;  Surgeon: Maire Scot, MD;  Location: [facility] ENDOSCOPY;  Service: Endoscopy;  Laterality: N/A;    Allergies  Allergen Reactions   Caine-1 [Lidocaine] Swelling    Allergic to Septocaine Articaine HCL 4% w/epi   Indomethacin Swelling   Other Diarrhea    Dairy Products    Articaine-Epinephrine  Other (See Comments)   Gabapentin      "Twitches"   Hydrocodone Nausea Only   Latex Swelling    Possible allergy    Lamisil [Terbinafine] Rash    Immunization History  Administered Date(s) Administered   Fluad Quad(high Dose 65+) 05/29/2022, 05/14/2023   Fluzone Influenza virus vaccine,trivalent (IIV3), split virus 06/09/2014, 06/08/2015, 06/08/2017, 06/19/2018, 05/27/2019, 05/18/2020, 05/31/2021   Hepatitis A 03/10/2003, 10/15/2003   Hepatitis B 04/15/2003, 05/21/2003, 10/15/2003   Influenza, High Dose Seasonal PF 06/19/2018   Influenza,inj,Quad PF,6+ Mos 06/13/2011   Influenza-Unspecified 05/16/2013   PFIZER(Purple Top)SARS-COV-2 Vaccination 10/01/2019, 10/21/2019, 05/07/2020, 12/15/2020, 05/31/2021, 06/01/2022   PNEUMOCOCCAL CONJUGATE-20 05/02/2023   Pneumococcal Conjugate-13 10/18/2013   Pneumococcal Polysaccharide-23 06/22/2010   Respiratory Syncytial Virus Vaccine,Recomb Aduvanted(Arexvy) 05/11/2022   Td 03/10/2003   Tdap 11/08/2010, 12/29/2019, 01/11/2020   Typhoid Inactivated 03/10/2003, 07/05/2013   Yellow Fever  07/05/2013   Zoster, Live 01/19/2006, 12/21/2016, 06/26/2017    Family History  Problem Relation Age of Onset   Hypertension Mother    Cancer Sister        melanoma   Autoimmune disease Sister        crest and sjor   Lupus Brother      Current Outpatient Medications:    amLODipine  (NORVASC ) 10 MG tablet, Take 10 mg by mouth daily., Disp: , Rfl:    atorvastatin  (LIPITOR) 10 MG tablet, Take 0.5 tablets (5 mg total) by mouth daily., Disp: 45 tablet, Rfl: 3   botulinum toxin Type A (BOTOX) 100 units SOLR injection, Inject into the skin., Disp: , Rfl:    budesonide  (ENTOCORT EC ) 3 MG 24 hr capsule, Take 3 mg by mouth daily as needed (colitis flare)., Disp: , Rfl:    buPROPion  (WELLBUTRIN  XL) 150 MG 24 hr tablet, TAKE 1 TABLET(150  MG) BY MOUTH DAILY, Disp: 90 tablet, Rfl: 1   chlorhexidine  (PERIDEX ) 0.12 % solution, SMARTSIG:By Mouth, Disp: , Rfl:    clobetasol cream (TEMOVATE) 0.05 %, APPLY TOPICALLY TO THE AFFECTED AREA TWICE DAILY. STOP WHEN SMOOTH. AVOID FACE AND BODY FOLDS, Disp: , Rfl:    finasteride  (PROSCAR ) 5 MG tablet, Take 5 mg by mouth daily., Disp: , Rfl:    Melatonin 5 MG TABS, Take 5 mg by mouth at bedtime as needed (sleep)., Disp: , Rfl:    Multiple Vitamin (MULTIVITAMIN) tablet, Take 1 tablet by mouth daily., Disp: , Rfl:    Potassium 99 MG TABS, Take 99 mg by mouth daily., Disp: , Rfl:    Povidone, PF, (IVIZIA DRY EYES) 0.5 % SOLN, Place 1 drop into both eyes at bedtime., Disp: , Rfl:    SUMAtriptan (IMITREX) 50 MG tablet, Take 50 mg by mouth every 2 (two) hours as needed for migraine., Disp: , Rfl:    triamcinolone ointment (KENALOG) 0.1 %, Apply to left nare daily as needed., Disp: , Rfl:    TYMLOS 3120 MCG/1.56ML SOPN, As directed once daily, Disp: , Rfl:    albuterol  (VENTOLIN  HFA) 108 (90 Base) MCG/ACT inhaler, Inhale 2 puffs into the lungs every 6 (six) hours as needed for wheezing or shortness of breath. (Patient not taking: Reported on 02/01/2024), Disp: 8 g, Rfl:  2      Objective:   Vitals:   02/01/24 1247  BP: 118/66  Pulse: 68  Temp: 97.6 F (36.4 C)  TempSrc: Oral  SpO2: 100%    Estimated body mass index is 18.74 kg/m as calculated from the following:   Height as of 12/20/23: 5\' 3"  (1.6 m).   Weight as of 12/20/23: 105 lb 12.8 oz (48 kg).  @WEIGHTCHANGE @  There were no vitals filed for this visit.   Physical Exam   General: No distress. Looks well O2 at rest: no Cane present: no Sitting in wheel chair: no Frail: no Obese: no Neuro: Alert and Oriented x 3. GCS 15. Speech normal Psych: Pleasant Resp:  Barrel Chest - no.  Wheeze - no, Crackles - no, No overt respiratory distress CVS: Normal heart sounds. Murmurs - no Ext: Stigmata of Connective Tissue Disease - no HEENT: Normal upper airway. PEERL +. No post nasal drip        Assessment:       ICD-10-CM   1. Acute bronchitis, unspecified organism  J20.9     2. MTB (Mycobacterium tuberculosis infection)  A15.9     3. Lung nodules  R91.8     4. Former heavy cigarette smoker (20-39 per day)  Z87.891     5. Syncope, unspecified syncope type  R55          Plan:     Patient Instructions     ICD-10-CM   1. 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 Lung nodules  R91.8     3. Former heavy cigarette smoker (20-39 per day)  Z87.891     4. Acute bronchitis, unspecified organism  J20.9         Acute bronchitis  Plan  - Take Z Pak that you already have      MTB (Mycobacterium tuberculosis infection) Lung nodules Chronic cough Former heavy cigarette smoker (20-39 per day)  -Significantly improved and also CT scan of the chest November 2024 is hugely improved compared to April 2023 in January 2024.  Glad you finished antituberculous treatment but it  appears the cough is back and we will need to understand why.  Plan - repeat CT chest November 2025.  - use albuterol  as needed   Travel advice encounter to [place] in [place] in May  2025 Syncope x 2 during trip  Plan - talk to PCP Tena Feeling, MD about potential role for ZIO Monitor if applicable    Follow-up - Return in NOv 2025 to see DR Bertrum Brodie but after ct chest   FOLLOWUP Return for 15 min visit in NOv 2025.    SIGNATURE    Dr. Maire Scot, M.D., F.C.C.P,  Pulmonary and Critical Care Medicine Staff Physician, [facility] Center Director - Interstitial Lung Disease  Program  Pulmonary Fibrosis [facility] at [facility] [place], [place], [address]  Pager: [phone], If no answer or between  15:00h - 7:00h: call [phone] Telephone: [phone]  1:33 PM 02/01/2024

## 2024-02-08 ENCOUNTER — Encounter: Payer: Self-pay | Admitting: Internal Medicine

## 2024-02-09 DIAGNOSIS — I2510 Atherosclerotic heart disease of native coronary artery without angina pectoris: Secondary | ICD-10-CM | POA: Diagnosis not present

## 2024-02-09 DIAGNOSIS — I700 Atherosclerosis of aorta: Secondary | ICD-10-CM | POA: Diagnosis not present

## 2024-02-09 DIAGNOSIS — I10 Essential (primary) hypertension: Secondary | ICD-10-CM | POA: Diagnosis not present

## 2024-02-10 DIAGNOSIS — M8000XA Age-related osteoporosis with current pathological fracture, unspecified site, initial encounter for fracture: Secondary | ICD-10-CM | POA: Diagnosis not present

## 2024-02-10 DIAGNOSIS — I10 Essential (primary) hypertension: Secondary | ICD-10-CM | POA: Diagnosis not present

## 2024-02-10 DIAGNOSIS — E7800 Pure hypercholesterolemia, unspecified: Secondary | ICD-10-CM | POA: Diagnosis not present

## 2024-02-10 DIAGNOSIS — F32A Depression, unspecified: Secondary | ICD-10-CM | POA: Diagnosis not present

## 2024-02-10 DIAGNOSIS — I2510 Atherosclerotic heart disease of native coronary artery without angina pectoris: Secondary | ICD-10-CM | POA: Diagnosis not present

## 2024-02-10 DIAGNOSIS — I700 Atherosclerosis of aorta: Secondary | ICD-10-CM | POA: Diagnosis not present

## 2024-02-23 ENCOUNTER — Encounter: Payer: Self-pay | Admitting: Internal Medicine

## 2024-02-26 ENCOUNTER — Other Ambulatory Visit: Payer: Self-pay | Admitting: Obstetrics & Gynecology

## 2024-02-26 DIAGNOSIS — G43719 Chronic migraine without aura, intractable, without status migrainosus: Secondary | ICD-10-CM | POA: Diagnosis not present

## 2024-02-26 DIAGNOSIS — Z1231 Encounter for screening mammogram for malignant neoplasm of breast: Secondary | ICD-10-CM

## 2024-03-06 IMAGING — CT CT CHEST HIGH RESOLUTION
2 of 8 series · 14 of 36 positions shown, 17 images · non-contrast
Comparison: CT abdomen pelvis, 10/21/2021, CT chest, 09/15/2010

CLINICAL DATA: Follow-up pulmonary nodule



[Series 5: chest 2.00 br36 s3 cor soft · coronal · 0.64mm/px · 3 of 124 slices shown]
[im 25/124  lung]
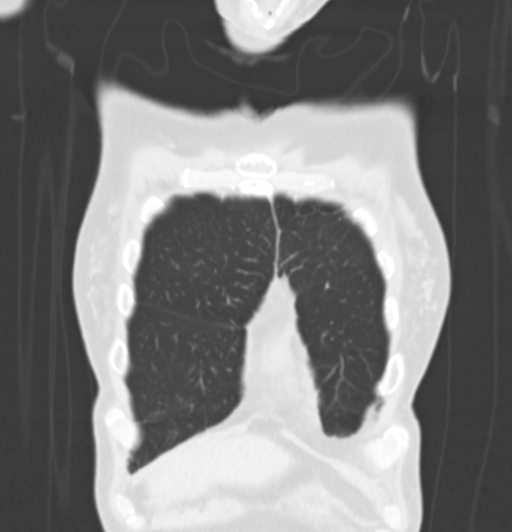
[im 50/124  lung]
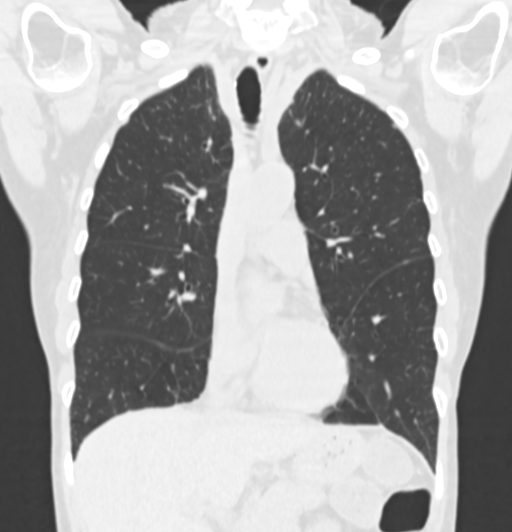
[im 74/124  lung]
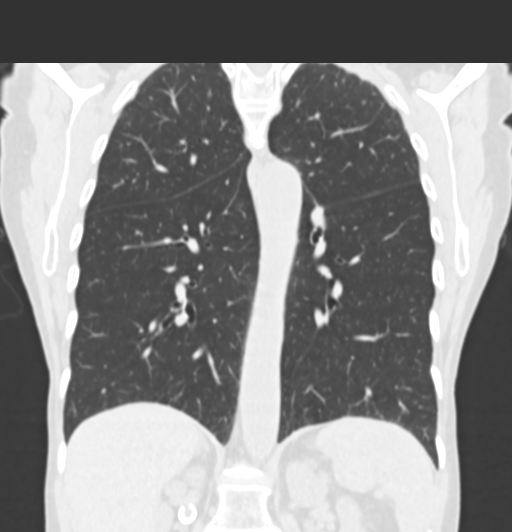

[Series 12: chest 1.00 br60 s3 high res thins 1x1 mm · axial · 0.61mm/px · z∈[+1490,+1762]mm · 11 of 328 slices shown, 14 images]
[im 28/328  mediastinal]
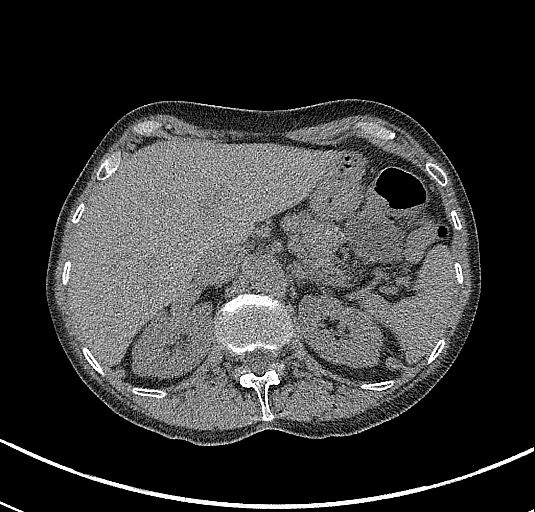
[im 28/328  lung]
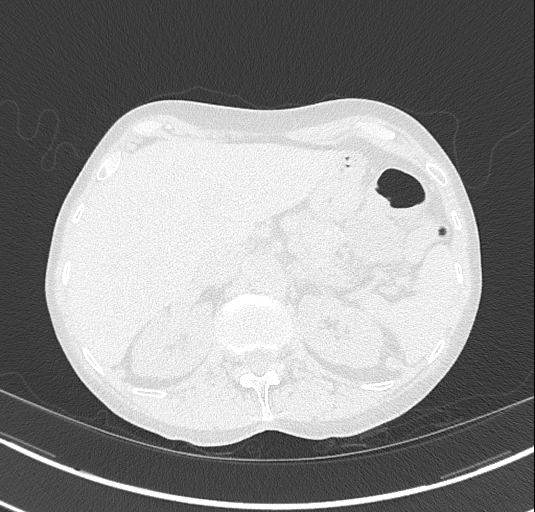
[im 55/328  lung]
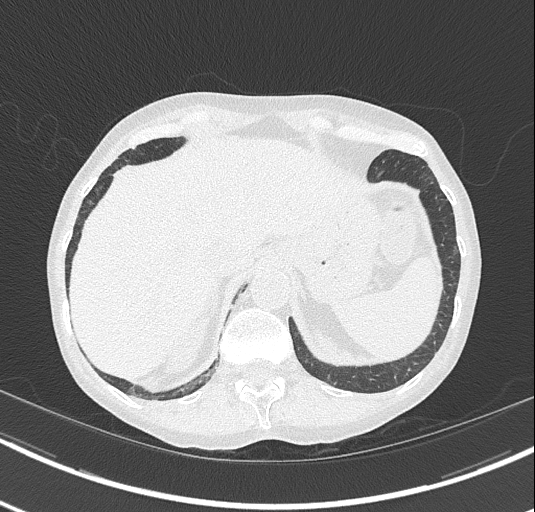
[im 82/328  lung]
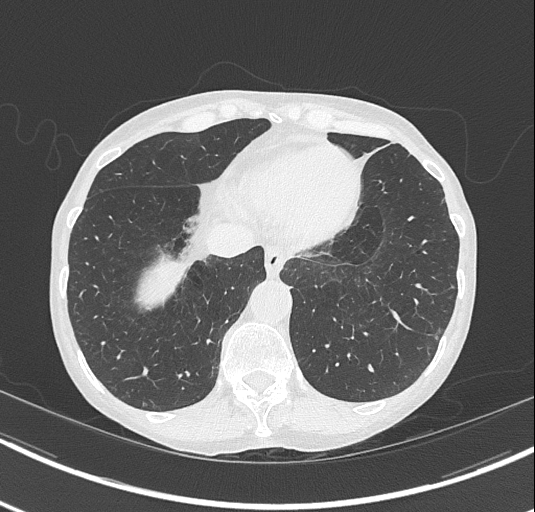
[im 110/328  lung]
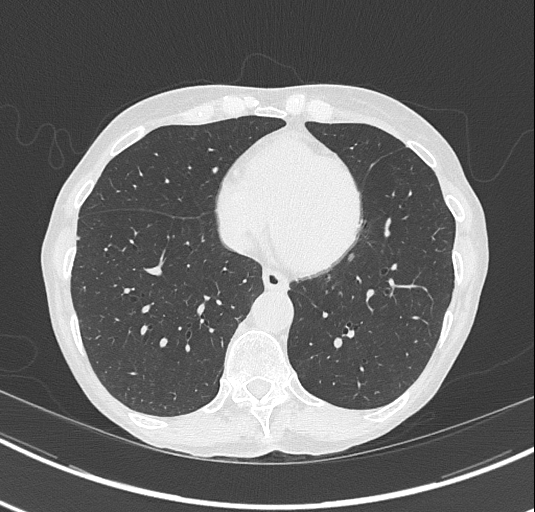
[im 137/328  mediastinal]
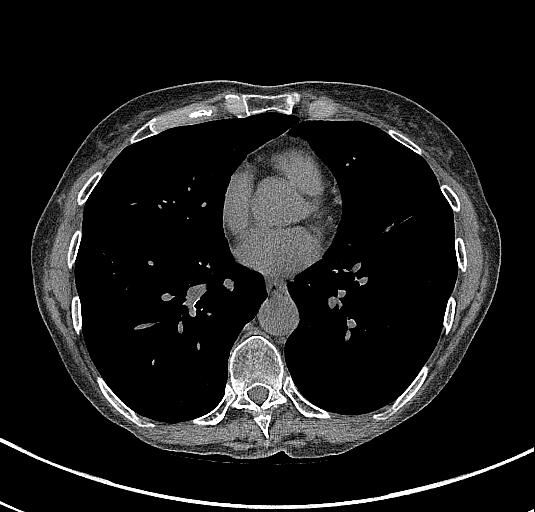
[im 137/328  lung]
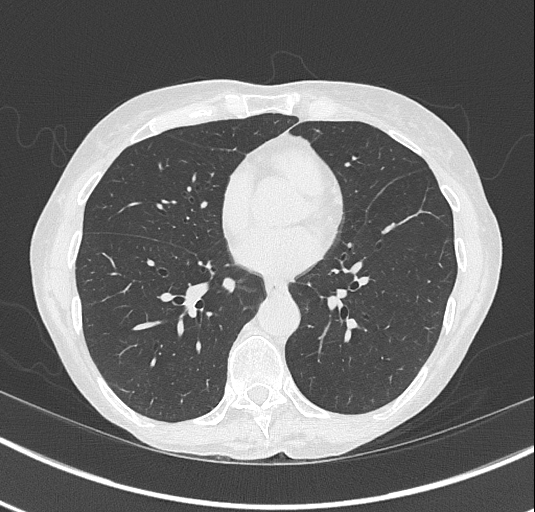
[im 164/328  lung]
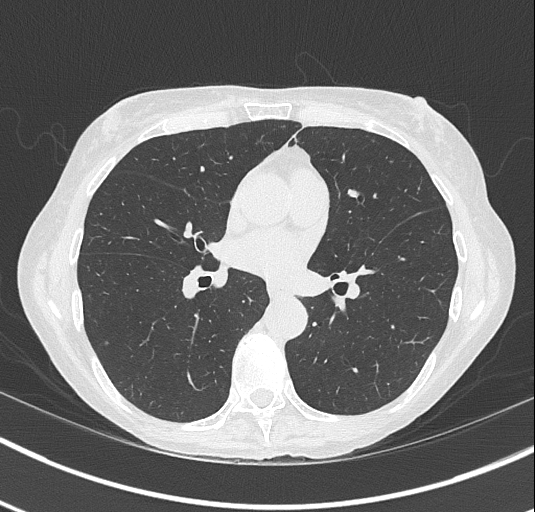
[im 191/328  lung]
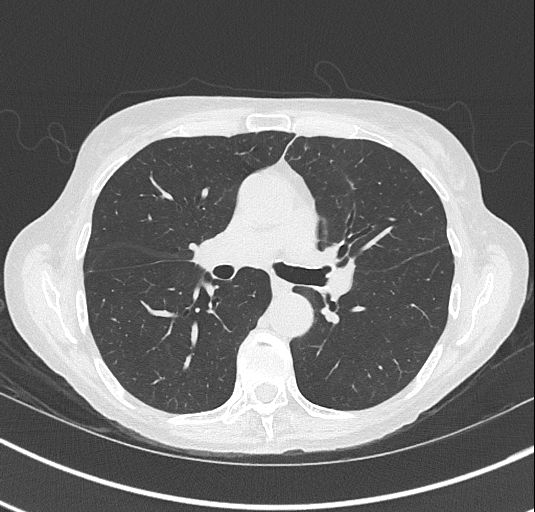
[im 219/328  lung]
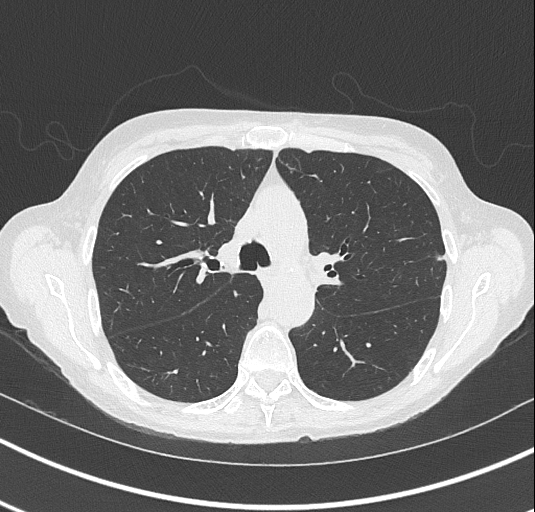
[im 246/328  mediastinal]
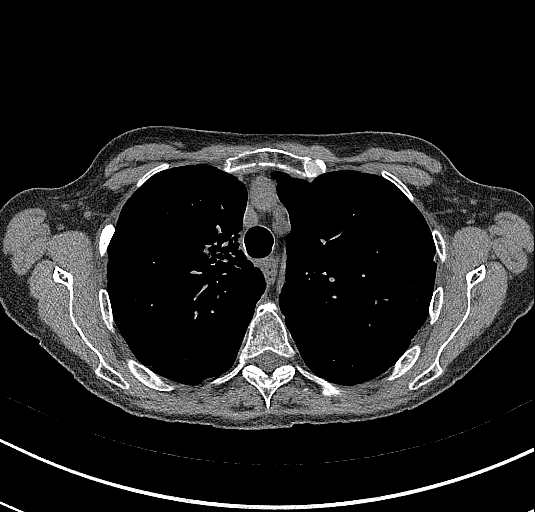
[im 246/328  lung]
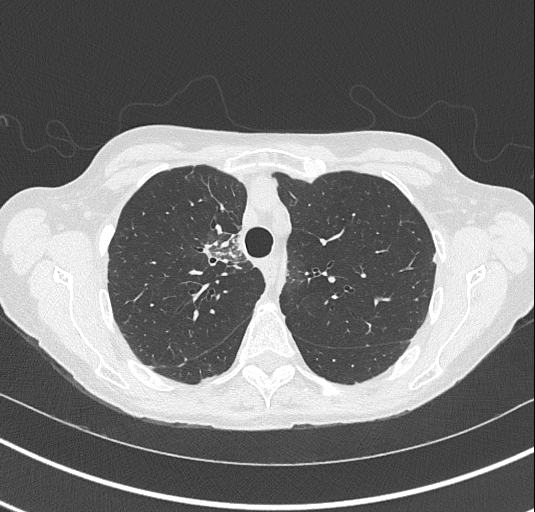
[im 273/328  lung]
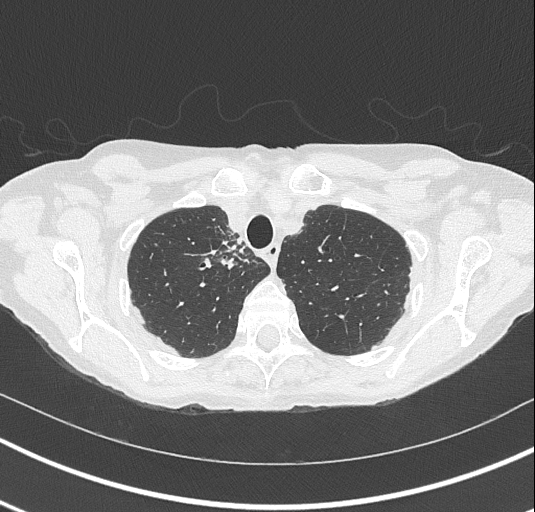
[im 300/328  lung]
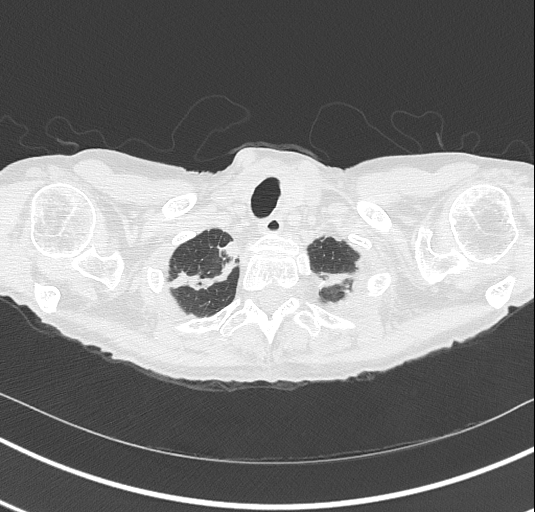

[14 of 36 positions shown; findings below may reference images not displayed]

FINDINGS: Cardiovascular: Scattered aortic atherosclerosis. Normal heart size.
No pericardial effusion.

Mediastinum/Nodes: No enlarged mediastinal, hilar, or axillary lymph
nodes. Thyroid gland, trachea, and esophagus demonstrate no
significant findings.

Lungs/Pleura: A 0.4 cm nodule of the dependent left lower lobe is
stable in comparison to examinations dating back to 09/15/2010 and
definitively benign (series 9, image 121). Additional adjacent
cm ground-glass nodule is likewise stable and benign (series 9,
image 115). Clustered centrilobular nodularity and consolidation of
the medial right pulmonary apex with some associated
pleuroparenchymal scarring (series 9, image 30), as well as of the
peripheral posterior left upper lobe (series 9, image 55). No
significant air trapping on expiratory phase imaging. No pleural
effusion or pneumothorax.

Upper Abdomen: No acute abnormality.

Musculoskeletal: No chest wall abnormality. No suspicious osseous
lesions identified.
IMPRESSION: 1. Small pulmonary nodules of the dependent left lower lobe are
stable on examinations dating back to at least 09/15/2010 and
definitively benign. No further follow-up or characterization is
indicated for these nodules.
2. Clustered centrilobular nodularity and consolidation of the
medial right pulmonary apex with some associated pleuroparenchymal
scarring, as well as of the peripheral posterior left upper lobe.
Findings are consistent with atypical infection, particularly
atypical Mycobacterium. These findings are new in comparison to
remote prior CT of the chest dated 09/15/2010.

Aortic Atherosclerosis ([id number]).

## 2024-03-10 DIAGNOSIS — I2510 Atherosclerotic heart disease of native coronary artery without angina pectoris: Secondary | ICD-10-CM | POA: Diagnosis not present

## 2024-03-10 DIAGNOSIS — I10 Essential (primary) hypertension: Secondary | ICD-10-CM | POA: Diagnosis not present

## 2024-03-10 DIAGNOSIS — I700 Atherosclerosis of aorta: Secondary | ICD-10-CM | POA: Diagnosis not present

## 2024-03-11 DIAGNOSIS — I10 Essential (primary) hypertension: Secondary | ICD-10-CM | POA: Diagnosis not present

## 2024-03-11 DIAGNOSIS — M8000XA Age-related osteoporosis with current pathological fracture, unspecified site, initial encounter for fracture: Secondary | ICD-10-CM | POA: Diagnosis not present

## 2024-03-11 DIAGNOSIS — F32A Depression, unspecified: Secondary | ICD-10-CM | POA: Diagnosis not present

## 2024-03-11 DIAGNOSIS — I2510 Atherosclerotic heart disease of native coronary artery without angina pectoris: Secondary | ICD-10-CM | POA: Diagnosis not present

## 2024-03-11 DIAGNOSIS — E7800 Pure hypercholesterolemia, unspecified: Secondary | ICD-10-CM | POA: Diagnosis not present

## 2024-03-11 DIAGNOSIS — I700 Atherosclerosis of aorta: Secondary | ICD-10-CM | POA: Diagnosis not present

## 2024-04-08 ENCOUNTER — Ambulatory Visit
Admission: RE | Admit: 2024-04-08 | Discharge: 2024-04-08 | Disposition: A | Discharge status: Home / Self Care | Source: Ambulatory Visit | Attending: Obstetrics & Gynecology | Admitting: Obstetrics & Gynecology

## 2024-04-08 DIAGNOSIS — Z1231 Encounter for screening mammogram for malignant neoplasm of breast: Secondary | ICD-10-CM | POA: Diagnosis not present

## 2024-04-09 DIAGNOSIS — I10 Essential (primary) hypertension: Secondary | ICD-10-CM | POA: Diagnosis not present

## 2024-04-09 DIAGNOSIS — I2510 Atherosclerotic heart disease of native coronary artery without angina pectoris: Secondary | ICD-10-CM | POA: Diagnosis not present

## 2024-04-09 DIAGNOSIS — I700 Atherosclerosis of aorta: Secondary | ICD-10-CM | POA: Diagnosis not present

## 2024-04-10 DIAGNOSIS — G894 Chronic pain syndrome: Secondary | ICD-10-CM | POA: Diagnosis not present

## 2024-04-10 DIAGNOSIS — M25552 Pain in left hip: Secondary | ICD-10-CM | POA: Diagnosis not present

## 2024-04-10 DIAGNOSIS — F4321 Adjustment disorder with depressed mood: Secondary | ICD-10-CM | POA: Diagnosis not present

## 2024-04-10 DIAGNOSIS — M47816 Spondylosis without myelopathy or radiculopathy, lumbar region: Secondary | ICD-10-CM | POA: Diagnosis not present

## 2024-04-11 DIAGNOSIS — I2510 Atherosclerotic heart disease of native coronary artery without angina pectoris: Secondary | ICD-10-CM | POA: Diagnosis not present

## 2024-04-11 DIAGNOSIS — F32A Depression, unspecified: Secondary | ICD-10-CM | POA: Diagnosis not present

## 2024-04-11 DIAGNOSIS — M8000XA Age-related osteoporosis with current pathological fracture, unspecified site, initial encounter for fracture: Secondary | ICD-10-CM | POA: Diagnosis not present

## 2024-04-11 DIAGNOSIS — E7800 Pure hypercholesterolemia, unspecified: Secondary | ICD-10-CM | POA: Diagnosis not present

## 2024-04-11 DIAGNOSIS — I700 Atherosclerosis of aorta: Secondary | ICD-10-CM | POA: Diagnosis not present

## 2024-04-11 DIAGNOSIS — I10 Essential (primary) hypertension: Secondary | ICD-10-CM | POA: Diagnosis not present

## 2024-04-15 DIAGNOSIS — L658 Other specified nonscarring hair loss: Secondary | ICD-10-CM | POA: Diagnosis not present

## 2024-04-15 DIAGNOSIS — R202 Paresthesia of skin: Secondary | ICD-10-CM | POA: Diagnosis not present

## 2024-04-15 DIAGNOSIS — L821 Other seborrheic keratosis: Secondary | ICD-10-CM | POA: Diagnosis not present

## 2024-04-15 DIAGNOSIS — Q828 Other specified congenital malformations of skin: Secondary | ICD-10-CM | POA: Diagnosis not present

## 2024-04-29 ENCOUNTER — Encounter: Payer: Self-pay | Admitting: Internal Medicine

## 2024-04-29 ENCOUNTER — Ambulatory Visit: Attending: Internal Medicine | Admitting: Internal Medicine

## 2024-04-29 VITALS — BP 122/78 | HR 68 | Ht 63.0 in | Wt 107.8 lb

## 2024-04-29 DIAGNOSIS — I2510 Atherosclerotic heart disease of native coronary artery without angina pectoris: Secondary | ICD-10-CM | POA: Insufficient documentation

## 2024-04-29 DIAGNOSIS — I447 Left bundle-branch block, unspecified: Secondary | ICD-10-CM | POA: Diagnosis not present

## 2024-04-29 DIAGNOSIS — I700 Atherosclerosis of aorta: Secondary | ICD-10-CM | POA: Insufficient documentation

## 2024-04-29 DIAGNOSIS — I351 Nonrheumatic aortic (valve) insufficiency: Secondary | ICD-10-CM | POA: Insufficient documentation

## 2024-04-29 DIAGNOSIS — E559 Vitamin D deficiency, unspecified: Secondary | ICD-10-CM | POA: Insufficient documentation

## 2024-04-29 DIAGNOSIS — I10 Essential (primary) hypertension: Secondary | ICD-10-CM | POA: Diagnosis not present

## 2024-04-29 DIAGNOSIS — I340 Nonrheumatic mitral (valve) insufficiency: Secondary | ICD-10-CM | POA: Diagnosis not present

## 2024-04-29 DIAGNOSIS — Z131 Encounter for screening for diabetes mellitus: Secondary | ICD-10-CM | POA: Diagnosis not present

## 2024-04-29 NOTE — Progress Notes (Unsigned)
 Cardiology Office Note   Date:  04/29/2024   ID:  Elaine Navarro, DOB April 13, 1946, MRN [id number]  PCP:  Dwight Trula SQUIBB, MD  Cardiologist:   Vina Gull, MD    Pt referred for evaluation of abnormal EKG   LBBB   History of Present Illness: Elaine Navarro is a 78 y.o. female with a history of HTN and LBBB    2023  Pt seen for preoop eval (knee surgery) due to LBBB   Pt extremely  active with competitive dancing  at the time   NO symptoms   Given high level activity, no testing done     Seen by CHRISTELLA Bane in May 2024   Dx with TB in Jan 2024   She continued  dancing  bit did note more SOB  CT of chest showed coronary calcifications and aortic atherosclerosis    With this sehe was set up for CCTA which showed calcium  score of 49.3   Minimal plaquing       July 2024   BP elevated   Amlodipine  increased and losartan  switched to valsartan      I saw the pt in Sept 2024  Since seen she has given up competivie dancing   She still dances in exhibitions.     She is alos very active now in golfing    She denies CP  Breathing is good  She is followed in pulmonary by CHRISTELLA Cave for hx lung infection Remains a vegetarian   No outpatient medications have been marked as taking for the 04/29/24 encounter (Appointment) with Gull Vina GAILS, MD.     Allergies:   Caine-1 [lidocaine], Indomethacin, Other, Articaine-epinephrine , Gabapentin , Hydrocodone, Latex, and Lamisil [terbinafine]   Past Medical History:  Diagnosis Date   Aneurysm of renal artery (HCC)    followed at [facility], stable with CT/angiogram 10/2021   Fracture of right wrist 2011   Fell in bathroom.   GERD (gastroesophageal reflux disease)    Hypertension    Left bundle branch block    Left thyroid  nodule 2003   S/P biopsy - benign   Lymphocytic colitis 03/2013   Migraines    Osteoporosis    PONV (postoperative nausea and vomiting)    Sciatica    Visceral injury    detached due to recent fall    Past Surgical History:   Procedure Laterality Date   ABDOMINOPLASTY  2010   APPENDECTOMY  1962   BREAST EXCISIONAL BIOPSY Bilateral 2001   x 3   BRONCHIAL WASHINGS  09/16/2022   Procedure: BRONCHIAL WASHINGS;  Surgeon: Cave Amel, MD;  Location: [facility] ENDOSCOPY;  Service: Endoscopy;;   CATARACT EXTRACTION, BILATERAL Bilateral 01/2020   Dental implants  1993   HEMORRHOID SURGERY  2000   JOINT REPLACEMENT     KNEE ARTHROSCOPY W/ DEBRIDEMENT Right 07/2020   Dr. Yvone   TOTAL KNEE ARTHROPLASTY Right 01/21/2022   Procedure: RIGHT TOTAL KNEE ARTHROPLASTY;  Surgeon: Vernetta Lonni GRADE, MD;  Location: [facility] ORS;  Service: Orthopedics;  Laterality: Right;  RFNA   VAGINAL HYSTERECTOMY  1974   Dysfunctional uterine bleeding.   VIDEO BRONCHOSCOPY N/A 09/16/2022   Procedure: VIDEO BRONCHOSCOPY WITHOUT FLUORO;  Surgeon: Cave Amel, MD;  Location: [facility] ENDOSCOPY;  Service: Endoscopy;  Laterality: N/A;     Social History:  The patient  reports that she quit smoking about 41 years ago. Her smoking use included cigarettes. She started smoking about 51 years ago. She has a 30 pack-year smoking history.  She has never used smokeless tobacco. She reports current alcohol use of about 7.0 standard drinks of alcohol per week. She reports that she does not use drugs.   Family History:  The patient's family history includes Autoimmune disease in her sister; Cancer in her sister; Hypertension in her mother; Lupus in her brother.    ROS:  Please see the history of present illness. All other systems are reviewed and  Negative to the above problem except as noted.    PHYSICAL EXAM: VS:  LMP 12/14/1972   HZW:Uypw 78 yo in NAD  HEENT: normal  Neck: no JVD, Cardiac: RRR; no murmurs,   No LE edema   Respiratory:  clear to auscultation GI: soft, nontender, No hepatomegaly  MS: no deformity Moving all extremities    EKG:  EKG  SR  LBBB   Echo  2024  1. Left ventricular ejection fraction, by estimation, is 55 to 60%. The   left ventricle has normal function. The left ventricle has no regional  wall motion abnormalities. Left ventricular diastolic parameters are  consistent with Grade I diastolic  dysfunction (impaired relaxation).   2. Right ventricular systolic function is normal. The right ventricular  size is normal. There is normal pulmonary artery systolic pressure. The  estimated right ventricular systolic pressure is 24.5 mmHg.   3. The mitral valve is grossly normal. Mild mitral valve regurgitation.  No evidence of mitral stenosis.   4. Tricuspid valve regurgitation is mild to moderate.   5. The aortic valve is tricuspid. There is mild calcification of the  aortic valve. Aortic valve regurgitation is mild. No aortic stenosis is  present. Aortic regurgitation PHT measures 501 msec.   6. The inferior vena cava is normal in size with greater than 50%  respiratory variability, suggesting right atrial pressure of 3 mmHg.   CTA Une 2024   IMPRESSION: 1. Minimal CAD, <25% stenosis, CADRADS 1.   2. Total plaque volume 72 mm3 which is 16th percentile for age- and sex-matched controls (calcified plaque 5 mm3; non-calcified plaque 67 mm3). TPV is mild.   3. Coronary calcium  score is 49.3, which places the patient in the 42nd percentile for age and sex matched control.   4. Normal coronary origins with right dominance.   RECOMMENDATIONS: CAD-RADS 1. Minimal non-obstructive CAD (0-24%). Consider non-atherosclerotic causes of chest pain. Consider preventive therapy and risk factor modification. Lipid Panel No results found for: CHOL, TRIG, HDL, CHOLHDL, VLDL, LDLCALC, LDLDIRECT    Wt Readings from Last 3 Encounters:  12/20/23 105 lb 12.8 oz (48 kg)  11/20/23 107 lb (48.5 kg)  07/31/23 109 lb 9.6 oz (49.7 kg)      ASSESSMENT AND PLAN:  1 CAD   Mild on CCTA in 2024  Pt symptom free     2  Hx  LBBB  Denies dizziness      3  HTN  BP is well controlled on current regimen    Continue   4  Pulmonary  Treated TB   FOllowed by CHRISTELLA Cave    4  Liver   Will recheck liver panel   6  HL  Will order NMR panel  Check Vit D and A1C  Follow up in June      Current medicines are reviewed at length with the patient today.  The patient does not have concerns regarding medicines.  Signed, Vina Gull, MD  [facility] Phone: [phone]; Fax: [phone]

## 2024-04-29 NOTE — Patient Instructions (Signed)
 Medication Instructions:  Your physician recommends that you continue on your current medications as directed. Please refer to the Current Medication list given to you today.  *If you need a refill on your cardiac medications before your next appointment, please call your pharmacy*  Lab Work: TODAY: NMR, LP(a), LFTs, Hgb A1c, vitamin D  If you have labs (blood work) drawn today and your tests are completely normal, you will receive your results only by: MyChart Message (if you have MyChart) OR A paper copy in the mail If you have any lab test that is abnormal or we need to change your treatment, we will call you to review the results.  Follow-Up: At [facility], you and your health needs are our priority.  As part of our continuing mission to provide you with exceptional heart care, our providers are all part of one team.  This team includes your primary Cardiologist (physician) and Advanced Practice Providers or APPs (Physician Assistants and Nurse Practitioners) who all work together to provide you with the care you need, when you need it.  Your next appointment:   10 month(s)  Provider:   Vina Gull, MD  We recommend signing up for the patient portal called MyChart.  Sign up information is provided on this After Visit Summary.  MyChart is used to connect with patients for Virtual Visits (Telemedicine).  Patients are able to view lab/test results, encounter notes, upcoming appointments, etc.  Non-urgent messages can be sent to your provider as well.    To learn more about what you can do with MyChart, go to [email or web address].

## 2024-04-30 ENCOUNTER — Ambulatory Visit: Payer: Self-pay | Admitting: Internal Medicine

## 2024-05-08 LAB — HEPATIC FUNCTION PANEL
ALT: 22 IU/L (ref 0–32)
AST: 32 IU/L (ref 0–40)
Albumin: 4.8 g/dL (ref 3.8–4.8)
Alkaline Phosphatase: 88 IU/L (ref 44–121)
Bilirubin Total: 0.5 mg/dL (ref 0.0–1.2)
Bilirubin, Direct: 0.19 mg/dL (ref 0.00–0.40)
Total Protein: 6.9 g/dL (ref 6.0–8.5)

## 2024-05-08 LAB — NMR, LIPOPROFILE
Cholesterol, Total: 173 mg/dL (ref 100–199)
HDL Particle Number: 49.3 umol/L (ref >=30.5)
HDL-C: 101 mg/dL (ref >39)
LDL Particle Number: 413 nmol/L (ref <1000)
LDL Size: 20.9 nm (ref >20.5)
LDL-C (NIH Calc): 56 mg/dL (ref 0–99)
LP-IR Score: 35 (ref <=45)
Small LDL Particle Number: <90 nmol/L (ref <=527)
Triglycerides: 87 mg/dL (ref 0–149)

## 2024-05-08 LAB — VITAMIN D 1,25 DIHYDROXY
Vitamin D 1, 25 (OH)2 Total: 82 pg/mL — ABNORMAL HIGH
Vitamin D2 1, 25 (OH)2: <10 pg/mL
Vitamin D3 1, 25 (OH)2: 77 pg/mL

## 2024-05-08 LAB — HEMOGLOBIN A1C
Est. average glucose Bld gHb Est-mCnc: 100 mg/dL
Hgb A1c MFr Bld: 5.1 % (ref 4.8–5.6)

## 2024-05-08 LAB — LIPOPROTEIN A (LPA): Lipoprotein (a): <8.4 nmol/L (ref <75.0)

## 2024-05-09 ENCOUNTER — Encounter: Payer: Self-pay | Admitting: Internal Medicine

## 2024-05-09 DIAGNOSIS — I2510 Atherosclerotic heart disease of native coronary artery without angina pectoris: Secondary | ICD-10-CM | POA: Diagnosis not present

## 2024-05-09 DIAGNOSIS — I700 Atherosclerosis of aorta: Secondary | ICD-10-CM | POA: Diagnosis not present

## 2024-05-09 DIAGNOSIS — I10 Essential (primary) hypertension: Secondary | ICD-10-CM | POA: Diagnosis not present

## 2024-05-12 DIAGNOSIS — E7800 Pure hypercholesterolemia, unspecified: Secondary | ICD-10-CM | POA: Diagnosis not present

## 2024-05-12 DIAGNOSIS — M8000XA Age-related osteoporosis with current pathological fracture, unspecified site, initial encounter for fracture: Secondary | ICD-10-CM | POA: Diagnosis not present

## 2024-05-12 DIAGNOSIS — I10 Essential (primary) hypertension: Secondary | ICD-10-CM | POA: Diagnosis not present

## 2024-05-12 DIAGNOSIS — I2510 Atherosclerotic heart disease of native coronary artery without angina pectoris: Secondary | ICD-10-CM | POA: Diagnosis not present

## 2024-05-12 DIAGNOSIS — I700 Atherosclerosis of aorta: Secondary | ICD-10-CM | POA: Diagnosis not present

## 2024-05-12 DIAGNOSIS — F32A Depression, unspecified: Secondary | ICD-10-CM | POA: Diagnosis not present

## 2024-05-14 DIAGNOSIS — A310 Pulmonary mycobacterial infection: Secondary | ICD-10-CM | POA: Diagnosis not present

## 2024-05-14 DIAGNOSIS — K52831 Collagenous colitis: Secondary | ICD-10-CM | POA: Diagnosis not present

## 2024-05-14 DIAGNOSIS — M810 Age-related osteoporosis without current pathological fracture: Secondary | ICD-10-CM | POA: Diagnosis not present

## 2024-05-15 DIAGNOSIS — E042 Nontoxic multinodular goiter: Secondary | ICD-10-CM | POA: Diagnosis not present

## 2024-05-15 DIAGNOSIS — M810 Age-related osteoporosis without current pathological fracture: Secondary | ICD-10-CM | POA: Diagnosis not present

## 2024-05-21 DIAGNOSIS — Z23 Encounter for immunization: Secondary | ICD-10-CM | POA: Diagnosis not present

## 2024-05-24 ENCOUNTER — Other Ambulatory Visit: Payer: Self-pay | Admitting: Internal Medicine

## 2024-05-30 DIAGNOSIS — Z0000 Encounter for general adult medical examination without abnormal findings: Secondary | ICD-10-CM | POA: Diagnosis not present

## 2024-05-30 DIAGNOSIS — M810 Age-related osteoporosis without current pathological fracture: Secondary | ICD-10-CM | POA: Diagnosis not present

## 2024-05-30 DIAGNOSIS — K644 Residual hemorrhoidal skin tags: Secondary | ICD-10-CM | POA: Diagnosis not present

## 2024-05-30 DIAGNOSIS — R4189 Other symptoms and signs involving cognitive functions and awareness: Secondary | ICD-10-CM | POA: Diagnosis not present

## 2024-05-30 DIAGNOSIS — K5289 Other specified noninfective gastroenteritis and colitis: Secondary | ICD-10-CM | POA: Diagnosis not present

## 2024-05-30 DIAGNOSIS — E7800 Pure hypercholesterolemia, unspecified: Secondary | ICD-10-CM | POA: Diagnosis not present

## 2024-05-30 DIAGNOSIS — F5101 Primary insomnia: Secondary | ICD-10-CM | POA: Diagnosis not present

## 2024-05-30 DIAGNOSIS — Z1331 Encounter for screening for depression: Secondary | ICD-10-CM | POA: Diagnosis not present

## 2024-05-30 DIAGNOSIS — G43909 Migraine, unspecified, not intractable, without status migrainosus: Secondary | ICD-10-CM | POA: Diagnosis not present

## 2024-05-30 DIAGNOSIS — I10 Essential (primary) hypertension: Secondary | ICD-10-CM | POA: Diagnosis not present

## 2024-05-30 DIAGNOSIS — F419 Anxiety disorder, unspecified: Secondary | ICD-10-CM | POA: Diagnosis not present

## 2024-06-03 DIAGNOSIS — R4189 Other symptoms and signs involving cognitive functions and awareness: Secondary | ICD-10-CM | POA: Diagnosis not present

## 2024-06-03 DIAGNOSIS — G43719 Chronic migraine without aura, intractable, without status migrainosus: Secondary | ICD-10-CM | POA: Diagnosis not present

## 2024-06-08 DIAGNOSIS — I700 Atherosclerosis of aorta: Secondary | ICD-10-CM | POA: Diagnosis not present

## 2024-06-08 DIAGNOSIS — I10 Essential (primary) hypertension: Secondary | ICD-10-CM | POA: Diagnosis not present

## 2024-06-08 DIAGNOSIS — I2510 Atherosclerotic heart disease of native coronary artery without angina pectoris: Secondary | ICD-10-CM | POA: Diagnosis not present

## 2024-06-11 DIAGNOSIS — I10 Essential (primary) hypertension: Secondary | ICD-10-CM | POA: Diagnosis not present

## 2024-06-11 DIAGNOSIS — E7800 Pure hypercholesterolemia, unspecified: Secondary | ICD-10-CM | POA: Diagnosis not present

## 2024-06-11 DIAGNOSIS — M8000XA Age-related osteoporosis with current pathological fracture, unspecified site, initial encounter for fracture: Secondary | ICD-10-CM | POA: Diagnosis not present

## 2024-06-11 DIAGNOSIS — I2510 Atherosclerotic heart disease of native coronary artery without angina pectoris: Secondary | ICD-10-CM | POA: Diagnosis not present

## 2024-06-11 DIAGNOSIS — I700 Atherosclerosis of aorta: Secondary | ICD-10-CM | POA: Diagnosis not present

## 2024-06-11 DIAGNOSIS — F32A Depression, unspecified: Secondary | ICD-10-CM | POA: Diagnosis not present

## 2024-06-13 ENCOUNTER — Ambulatory Visit: Admitting: Internal Medicine

## 2024-06-14 ENCOUNTER — Encounter: Payer: Self-pay | Admitting: Internal Medicine

## 2024-06-19 ENCOUNTER — Ambulatory Visit
Admission: RE | Admit: 2024-06-19 | Discharge: 2024-06-19 | Disposition: A | Discharge status: Home / Self Care | Source: Ambulatory Visit | Attending: Internal Medicine | Admitting: Internal Medicine

## 2024-06-19 DIAGNOSIS — R918 Other nonspecific abnormal finding of lung field: Secondary | ICD-10-CM

## 2024-06-19 DIAGNOSIS — A159 Respiratory tuberculosis unspecified: Secondary | ICD-10-CM

## 2024-06-19 DIAGNOSIS — Z87891 Personal history of nicotine dependence: Secondary | ICD-10-CM

## 2024-06-19 DIAGNOSIS — R053 Chronic cough: Secondary | ICD-10-CM

## 2024-06-19 DIAGNOSIS — Z7184 Encounter for health counseling related to travel: Secondary | ICD-10-CM

## 2024-06-22 ENCOUNTER — Encounter (HOSPITAL_BASED_OUTPATIENT_CLINIC_OR_DEPARTMENT_OTHER): Payer: Self-pay | Admitting: Obstetrics & Gynecology

## 2024-06-22 ENCOUNTER — Encounter: Payer: Self-pay | Admitting: Internal Medicine

## 2024-06-23 ENCOUNTER — Ambulatory Visit: Payer: Self-pay | Admitting: Internal Medicine

## 2024-06-23 NOTE — Progress Notes (Signed)
  IMPRESSION: 1. Unchanged irregular nodularity in the peripheral left upper lobe. Unchanged clustered centrilobular nodularity in the peripheral right upper lobe. Findings are most consistent with sequelae of atypical infection including atypical mycobacterium, without specific evidence of ongoing infection. 2. Unchanged biapical pleural-parenchymal scarring, greater in the right apex. 3. Coronary artery disease. 4. Unchanged, densely rim calcified aneurysm of the right renal artery or a branch near the right renal hilum measuring 1.2 x 1.2 cm.   Aortic Atherosclerosis (ICD10-I70.0).     Electronically Signed   By: Marolyn JONETTA Jaksch M.D.   On: 06/21/2024 16:01

## 2024-06-28 NOTE — Telephone Encounter (Signed)
 I am aware that she has atherosclerosis     Had CCtA Lipids are excellent  Just follow BP over time   Keep diet minimally processed foods, lots of veggies

## 2024-07-03 ENCOUNTER — Other Ambulatory Visit (HOSPITAL_BASED_OUTPATIENT_CLINIC_OR_DEPARTMENT_OTHER): Payer: Self-pay | Admitting: Obstetrics & Gynecology

## 2024-07-03 ENCOUNTER — Encounter (HOSPITAL_BASED_OUTPATIENT_CLINIC_OR_DEPARTMENT_OTHER): Payer: Self-pay | Admitting: Obstetrics & Gynecology

## 2024-07-03 DIAGNOSIS — F419 Anxiety disorder, unspecified: Secondary | ICD-10-CM

## 2024-07-03 MED ORDER — BUPROPION HCL ER (XL) 150 MG PO TB24
150.0000 mg | ORAL_TABLET | Freq: Every day | ORAL | 3 refills | Status: AC
Start: 2024-07-03 — End: ?

## 2024-07-08 DIAGNOSIS — I700 Atherosclerosis of aorta: Secondary | ICD-10-CM | POA: Diagnosis not present

## 2024-07-08 DIAGNOSIS — I10 Essential (primary) hypertension: Secondary | ICD-10-CM | POA: Diagnosis not present

## 2024-07-08 DIAGNOSIS — I2510 Atherosclerotic heart disease of native coronary artery without angina pectoris: Secondary | ICD-10-CM | POA: Diagnosis not present

## 2024-07-09 DIAGNOSIS — R413 Other amnesia: Secondary | ICD-10-CM | POA: Diagnosis not present

## 2024-07-11 DIAGNOSIS — F4321 Adjustment disorder with depressed mood: Secondary | ICD-10-CM | POA: Diagnosis not present

## 2024-07-11 DIAGNOSIS — R413 Other amnesia: Secondary | ICD-10-CM | POA: Diagnosis not present

## 2024-07-11 DIAGNOSIS — M47816 Spondylosis without myelopathy or radiculopathy, lumbar region: Secondary | ICD-10-CM | POA: Diagnosis not present

## 2024-07-11 DIAGNOSIS — G894 Chronic pain syndrome: Secondary | ICD-10-CM | POA: Diagnosis not present

## 2024-07-11 DIAGNOSIS — M25552 Pain in left hip: Secondary | ICD-10-CM | POA: Diagnosis not present

## 2024-07-12 ENCOUNTER — Ambulatory Visit: Admitting: Internal Medicine

## 2024-07-12 ENCOUNTER — Encounter: Payer: Self-pay | Admitting: Internal Medicine

## 2024-07-12 VITALS — BP 134/76 | HR 79 | Temp 98.0°F | Ht 63.0 in | Wt 110.8 lb

## 2024-07-12 DIAGNOSIS — I700 Atherosclerosis of aorta: Secondary | ICD-10-CM | POA: Diagnosis not present

## 2024-07-12 DIAGNOSIS — I2510 Atherosclerotic heart disease of native coronary artery without angina pectoris: Secondary | ICD-10-CM | POA: Diagnosis not present

## 2024-07-12 DIAGNOSIS — F32A Depression, unspecified: Secondary | ICD-10-CM | POA: Diagnosis not present

## 2024-07-12 DIAGNOSIS — A159 Respiratory tuberculosis unspecified: Secondary | ICD-10-CM

## 2024-07-12 DIAGNOSIS — Z87891 Personal history of nicotine dependence: Secondary | ICD-10-CM

## 2024-07-12 DIAGNOSIS — Z7184 Encounter for health counseling related to travel: Secondary | ICD-10-CM

## 2024-07-12 DIAGNOSIS — M8000XA Age-related osteoporosis with current pathological fracture, unspecified site, initial encounter for fracture: Secondary | ICD-10-CM | POA: Diagnosis not present

## 2024-07-12 DIAGNOSIS — R918 Other nonspecific abnormal finding of lung field: Secondary | ICD-10-CM

## 2024-07-12 DIAGNOSIS — E7800 Pure hypercholesterolemia, unspecified: Secondary | ICD-10-CM | POA: Diagnosis not present

## 2024-07-12 DIAGNOSIS — I10 Essential (primary) hypertension: Secondary | ICD-10-CM | POA: Diagnosis not present

## 2024-07-12 NOTE — Progress Notes (Addendum)
 OV 08/18/2022   OV 11/09/2021  Subjective:  Patient ID: Elaine Navarro, female , DOB: 1946/07/15 , age 78 y.o. , MRN: [id number] , ADDRESS: [address] [place] [address] PCP Cyrena Gwenn SQUIBB, MD Patient Care Team: Cyrena Gwenn SQUIBB, MD as PCP - General (Family Medicine)  This Provider for this visit: Treatment Team:  Attending Provider: Geronimo Amel, MD    11/09/2021 -   Chief Complaint  Patient presents with   Consult    Pt had a CT performed which is the reason for today's visit.     HPI Elaine Navarro 78 y.o. -presents with her husband.  Concern is left lower lobe nodule 4 mm seen on CT angiogram abdomen done for renal artery aneurysm.  She sees physician at [facility] for renal artery aneurysm.  During this time she had a CT scan of the abdomen done with contrast here in [place].  This showed a 4 mm lung nodule in the left lower lobe.  According to Dr. Bonnetta it is stable over 10 years.  I personally visualized this.  Some bibasilar atelectasis also reported although I am not convinced about this.  Patient herself states that overall she is stable.  She says that in July 2022 she had COVID and after that she had significant cough.  Primary care treated with cough Tessalon Perles.  The cough is now resolved for the last 5 weeks.  Currently no undue shortness of breath.  No cough no wheezing no paroxysmal nocturnal dyspnea no orthopnea.  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  She is a remote 30 pack smoker having quit in 1984 when she moved from [place] to [place].  While in [place] as a child she got exposed to her both grandfather and aunt with tuberculosis.  She says she herself was skin test positive but denies any latent TB treatment.  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  But this was just general air pollution.  She is worried about the implications of all this for her health.  She says clinically her previous primary care physician told her she might have COPD.  She also is a brother with COPD.  She wants to be sure that there is no undue health issues with her lung.  She also wants to continue doing ballroom dancing at a championship level.    CT Chest data 10/21/21   Narrative & Impression  CLINICAL DATA:  Renal artery aneurysm   EXAM: CT ANGIOGRAPHY ABDOMEN   TECHNIQUE: Multidetector CT imaging of the abdomen was performed using the standard protocol during bolus administration of intravenous contrast. Multiplanar reconstructed images and MIPs were obtained and reviewed to evaluate the vascular anatomy.   RADIATION DOSE REDUCTION: This exam was performed according to the departmental dose-optimization program which includes automated exposure control, adjustment of the mA and/or kV according to patient size and/or use of iterative reconstruction technique.   CONTRAST:  75mL OMNIPAQUE  IOHEXOL  350 MG/ML SOLN   COMPARISON:  CT abdomen dated August 13, 2012   FINDINGS: VASCULAR   Aorta: Normal caliber abdominal aorta with mild calcified plaque and no significant stenosis.   Celiac: Normal caliber celiac artery with no significant atherosclerotic disease or stenosis.   SMA: Visualized  SMA is normal in caliber with no significant atherosclerotic disease or stenosis.   Renals: Peripherally calcified renal artery aneurysm arising from a lower pole segmental branch measuring 1.1 x 1.1 x 1.3 cm, unchanged in size when compared to prior exam. Bilateral main renal arteries are normal in caliber with minimal calcified plaque and no stenosis.   IMA: Visualized portion is normal in caliber with no evidence of stenosis.   Veins: No venous abnormality.   Review of the MIP images confirms the above  findings.   NON-VASCULAR   Lower chest: Stable solid 4 mm nodule of the left lower lobe located on image 13, likely benign. Bibasilar atelectasis.   Hepatobiliary: No focal liver abnormality is seen. No gallstones, gallbladder wall thickening, or biliary dilatation.   Pancreas: Unremarkable. No pancreatic ductal dilatation or surrounding inflammatory changes.   Spleen: Normal in size without focal abnormality.   Adrenals/Urinary Tract: Adrenal glands are unremarkable. Kidneys are normal, without renal calculi, focal lesion, or hydronephrosis. Bladder is unremarkable.   Stomach/Bowel: Visualized portions of the small and large bowel are unremarkable.   Lymphatic: No pathologically enlarged lymph nodes seen in the abdomen.   Other: No abdominal wall hernia or abdominal ascites.   Musculoskeletal: No acute or significant osseous findings.   IMPRESSION: 1. 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 2.  Aortic Atherosclerosis (ICD10-I70.0).     Electronically Signed   By: Rea Marc M.D.   On: 10/22/2021 10:03       12/28/2021 Follow up : Lung nodule  Patient returns for a 28-month follow-up.  Patient was seen for pulmonary consult November 09, 2021 for abnormal CT chest that showed 4 mm lung nodule.  Patient also has some mild shortness of breath with activities.  Has a family history of TB and COPD.  She is a former smoker.  Patient was set up for a high-resolution CT chest that showed no enlarged lymph nodes.  A 0.4 cm nodule in the left lower lobe that is stable compared back to 2012.  Consistent with a benign etiology.  Clustered nodularity and consolidation in the right apex with associated parenchymal scarring as well in the left upper lobe.  Findings are consistent with possible atypical infection such as atypical Mycobacterium.  Alpha-1 testing was normal level and phenotype.  Patient says she has very minimum cough if any.  Shortness of breath is  very mild in nature.  Patient was set up for pulmonary function testing that was normal that showed FEV1 at 125%, ratio 80, FVC 117%.,  No significant bronchodilator response, DLCO 82%. She says that she gets bronchitis once or twice a year.  Usually resolves pretty easily.  She says most days she has no cough at all.  Is not limited by activities.  She did have COVID-19 in July 2022. Patient does have colitis and has issues with weight loss at times.  Says occasionally she has some swallow issues.  She denies any hemoptysis, discolored mucus, fever.   IMPRESSION: 1. Small pulmonary nodules of the dependent left lower lobe are stable on examinations dating back to at least 09/15/2010 and definitively benign. No further follow-up or characterization is indicated for these nodules. 2. Clustered centrilobular nodularity and consolidation of the medial right pulmonary apex with some associated pleuroparenchymal scarring, as well as of the peripheral posterior left upper lobe. 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 These findings are new in comparison to remote prior CT of the chest  dated 09/15/2010.   Aortic Atherosclerosis (ICD10-I70.0).     Electronically Signed   By: Marolyn JONETTA Jaksch M.D.   On: 12/17/2021 13:00     06/30/2022 Patient presents today for acute office visit. She has a history of pulmonary nodules.  She had a CT in April 2023 that showed findings atypical mycobacterium. She has had a cough since August.  Primary care prescribed rifampin, ethambutol and azithromycin  recently d/t her symptoms and image findings. She developed white tongue and sore throat after taking medication for 3 days. She did notice improvement in cough while on abx. She has had no sputum cultures. She has never had a bronchoscopy. No recent chest imaging. She plans to travel to [place] on 07/09/22. No significant shortness of breath except with coughing fits.  Cough can be productive with white-yellow phlegm. She is afebrile.    Subjective:  Patient ID: Elaine Navarro, female , DOB: [birth year] , age 20 y.o. , MRN: [id number] , ADDRESS: [address] [place] [id number] PCP Dwight Trula SQUIBB, MD Patient Care Team: Dwight Trula SQUIBB, MD as PCP - General (Internal Medicine)  This Provider for this visit: Treatment Team:  Attending Provider: Geronimo Amel, MD    08/18/2022 -   Chief Complaint  Patient presents with   Follow-up    Pt states she is still coughing and also has chest congestion as well as tightness in chest. States her cough is worse in the evening and states she is coughing up white phlegm.     HPI Elaine Navarro 78 y.o. -returns for follow-up.  Presents with her husband.  I saw her earlier in the year in 2023.  After that she has had visits with the nurse practitioner.  She tells me that the cough that started in the summer 2022 after COVID still persist.  After I saw her I ordered CT scan of the chest that showed nodularity that was clustered.  Her primary care physician empirically prescribed a triple antibiotic therapy for MAI infection.  However she developed a coated tongue.  Also given the empiric nature of the diagnosis we recommended she stop it.  Which she did.  After that in October 2023 she saw a publishing rights manager.  Sputum culture grew haemophilus influenza and Candida.  She was given Z-Pak.  She did leave for her trip to [place] on 07/09/2022 but the trip got changed because of the [place] war.  Instead she went to [place] [employer] and from there it was a [place] cruise from 07/11/2022.  She travel to the [place] in [place].  She went to [place] she went to [place] and she came back to [place]  via [place] on 07/24/2022.  Shortly after arriving she got COVID-19 for the second time.  She believes she contracted this in the flight.  During the trip her cough is significantly  worse rated at 8 out of 9.  The Z-Pak that she was prescribed she took it at that time and it did help but currently she is back with residual cough at the level 5 out of 10.  Chest feels tight.  There is occasional white stuff.  Particularly sputum.  She is quite miserable with the symptoms.  She also has a runny nose for the last 5 weeks.  She wants her symptoms addressed.  Labs show normal QuantiFERON gold but no allergy  workup. Alpha-1 antitrypsin is normal. She is a former 30 pack smoking history.   Of note for  this visit I personally visualized the CT scan with the previous CT scan from April 2023.     OV 10/11/2022  Subjective:  Patient ID: Elaine Navarro, female , DOB: Mar 07, 1946 , age 47 y.o. , MRN: [id number] , ADDRESS: [id number] [place] [facility] [id number] PCP Dwight Trula SQUIBB, MD Patient Care Team: Dwight Trula SQUIBB, MD as PCP - General (Internal Medicine)  This Provider for this visit: Treatment Team:  Attending Provider: Geronimo Amel, MD    10/11/2022 -   Chief Complaint  Patient presents with   Follow-up    Cough is improving. She has not started her spiriva  due to high cost. Her cough is occ prod with white sputum. Appetite is good. No fevers, sweats.     HPI Elaine Navarro 78 y.o. -returns for follow-up.  At this point in time under the coordination of Department of Public Health she is completed 2 weeks of Mycobacterium tuberculosis treatment and date labs of therapy.  She already states that her cough is improved significantly her chest tightness is improved significantly although residual symptoms are still there.  She is feeling better she is tolerating the antituberculous treatment well.  She is no longer considered contagious and has been allowed to go in public according to her history.  Her husband is with her but he is not providing any independent history today.  The test sputum culture was done by nurse practitioner in October 2023.  Right  prior to starting the antituberculous treatment we did a CT scan of the chest and a pulmonary right upper lobe nodules were actually getting worse.  A bronchoscopy was done early January 2024 and the nucleocapsid amplification of Mycobacterium tuberculosis negative but the culture is still pending and so far no growth.  Nevertheless she is feeling better.  Of note she had some mild emphysema on the CT scan we ordered Spiriva  but it is too expensive and she is feeling better so we told her to hold off.   Her current response to typical treatment and terms of cough is profile below     CT cest 09/21/22   Narrative & Impression  CLINICAL DATA:  78 year old female presents for evaluation of chronic productive cough.   EXAM: CT CHEST WITHOUT CONTRAST   TECHNIQUE: Multidetector CT imaging of the chest was performed following the standard protocol without IV contrast.   RADIATION DOSE REDUCTION: This exam was performed according to the departmental dose-optimization program which includes automated exposure control, adjustment of the mA and/or kV according to patient size and/or use of iterative reconstruction technique.   COMPARISON:  December 17, 2021   FINDINGS: Cardiovascular: Calcified aortic atherosclerosis is mild. Normal caliber of the thoracic aorta. Normal heart size. Scattered coronary artery calcifications. Normal caliber of central pulmonary vasculature.   Mediastinum/Nodes: LEFT thyroid  nodule up to 2.3 cm with areas of calcification. No adenopathy in the chest. Esophagus is grossly normal by CT.   Lungs/Pleura: Biapical scarring. More confluent area of nodularity in the RIGHT upper lobe has increased considerably since previous imaging 3.0 x 1.3 cm. Previously this was confined to an area along the medial and lateral surface of the apical pleural surfaces.   There are diffuse areas of nodularity in the RIGHT upper lobe metabolic so increased, some areas new since  previous imaging. Juxtapleural nodule in the anterior RIGHT upper lobe (image 36/5) 8 mm, this is a new finding. Grouped nodules in the RIGHT upper lobe and some tree in bud on  images 36 through 41 are also new.   Some improvement with respect to bronchovascular nodularity extending towards the RIGHT lung apex that was seen in the medial RIGHT upper lobe on the previous study. No signs of additional consolidative process or evidence of pleural effusion. Mild LEFT apical scarring.   Stable LEFT lower lobe nodule (image 132/5) 4 mm. Airways are patent. No consolidation or sign of pleural effusion.   Upper Abdomen: No acute upper abdominal findings to the extent evaluated.   Musculoskeletal: No chest wall mass.  Spinal degenerative changes.   IMPRESSION: 1. Resolution of some areas of nodularity with worsening of peripheral areas of nodularity in the RIGHT upper lobe. Findings favor sequela of infection perhaps MAI or atypical infection. 2. More confluent area of nodularity also having developed since previous imaging. Would suggest short interval follow-up to ensure that this area shows improvement and does not show persistent underlying masslike area or nodule. Would also consider correlation with respiratory symptoms and pulmonology consultation if not yet obtained. 3. LEFT thyroid  nodule up to 2.3 cm with areas of calcification. Suggest follow-up thyroid  ultrasound if not yet obtained. 4. Aortic atherosclerosis and coronary artery disease.   Aortic Atherosclerosis (ICD10-I70.0).     Electronically Signed   By: Isla Blind M.D.   On: 09/22/2022 14:09     OV 01/17/2023  Subjective:  Patient ID: Elaine Navarro, female , DOB: [birth year] , age 80 y.o. , MRN: [id number] , ADDRESS: [address] [place] [id number] PCP Dwight Trula SQUIBB, MD Patient Care Team: Dwight Trula SQUIBB, MD as PCP - General (Internal Medicine)  This Provider for this visit: Treatment Team:   Attending Provider: Geronimo Amel, MD  #Former heavy smoker 30 pack - #Right upper lobe nodule April 2023 worse in January 2024  -Sputum culture + October 2023 for Mycobacterium tuberculosis and also haemophilus  -Multiple QuantiFERON gold negative  -exposed to tuberculosis as an infant from her grandfather in the 1940s  01/17/2023 -   Chief Complaint  Patient presents with   Follow-up    CT F/up     HPI Elaine Navarro 78 y.o. -returns for follow-up.  Presents with her husband was an independent historian.  She now reports that her cough is even better with continued antituberculous treatment but beginning December 20, 2022 while on antituberculous treatment with close monitoring by the Department of Public Health she started having transaminitis.  Her AST went up to 84 and ALT went up to 108.  Then on Jan 13, 2023 AST was 114 and ALT was 144 and then on Jan 15, 2023 the antituberculous treatment was stopped.  She is not having any jaundice or discolored urine.  She has gained a little weight.  She says the cough is actually better.  She says she is getting good support from the [employer].  However she is worried about drug-induced liver injury.  Husband states she is really worried about this.  She is reading things on the Internet.  She says the public health department might switch her to another antituberculous treatment.  They are also requesting a right upper quadrant ultrasound.  She had CT scan of the chest May 2025 personally visualized it.  I am not fully sure if she is significantly improved or not compared to a year ago.  I asked for repeat read.  I did show her the CT report.  I also showed her the CT images.  In addition she did  have some vaginal yeast infection a month ago and took Diflucan pessary but now the vaginal itch is back.  Started having some pain in the right low back for the last 1 week.  She is got some dry mouth for the last 1 month.  She will  discuss this with primary care physician.       CT chest 01/13/23  Narrative & Impression  CLINICAL DATA:  Chronic cough, former smoker   EXAM: CT CHEST WITHOUT CONTRAST   TECHNIQUE: Multidetector CT imaging of the chest was performed following the standard protocol without IV contrast.   RADIATION DOSE REDUCTION: This exam was performed according to the departmental dose-optimization program which includes automated exposure control, adjustment of the mA and/or kV according to patient size and/or use of iterative reconstruction technique.   COMPARISON:  CT chest, 09/21/2022 thyroid  ultrasound, 05/12/2022   FINDINGS: Cardiovascular: Aortic atherosclerosis. Normal heart size. Left and right coronary artery calcifications. No pericardial effusion.   Mediastinum/Nodes: No enlarged mediastinal, hilar, or axillary lymph nodes. Thyroid  gland, trachea, and esophagus demonstrate no significant findings.   Lungs/Pleura: No significant change in an area of consolidation and volume loss of the right pulmonary apex (series 8, image 5). Adjacent clustered nodularity and small consolidations of overall improved, however are fluctuant, with a new nodule medially measuring 0.5 cm (series 8, image 22). Additional, occasional small nodules and ground-glass opacities elsewhere, for example a 0.4 cm nodule of the dependent left lower lobe, are unchanged (series 8, image 124). No pleural effusion or pneumothorax.   Upper Abdomen: No acute abnormality. Densely rim calcified aneurysm of the right renal artery or a branch arteriole measuring 1.3 x 1.2 cm (series 2, image 156).   Musculoskeletal: No chest wall abnormality. No acute osseous findings.   IMPRESSION: 1. No significant change in an area of consolidation and volume loss of the right pulmonary apex. Adjacent clustered nodularity and small consolidations of overall improved, however fluctuant. Findings are consistent with ongoing  atypical infection and chronic underlying sequelae, particularly atypical Mycobacterium. 2. Additional, occasional small nodules and ground-glass opacities elsewhere unchanged. 3. Incidental note of a densely rim calcified aneurysm of the right renal artery or a branch arteriole measuring 1.3 x 1.2 cm. 4. Coronary artery disease.   Aortic Atherosclerosis (ICD10-I70.0).     Electronically Signed   By: Marolyn JONETTA Jaksch M.D.   On: 01/17/2023 12:18    OV 05/02/2023  Subjective:  Patient ID: Elaine Navarro, female , DOB: 1946/08/27 , age 34 y.o. , MRN: [id number] , ADDRESS: [address] [place] [address] PCP Dwight Trula SQUIBB, MD Patient Care Team: Dwight Trula SQUIBB, MD as PCP - General (Internal Medicine) Okey Vina GAILS, MD as PCP - Cardiology (Cardiology)  This Provider for this visit: Treatment Team:  Attending Provider: Geronimo Amel, MD    05/02/2023 -   Chief Complaint  Patient presents with   Follow-up    F/up on TB exposure     HPI Elaine Navarro 78 y.o. -followup MTB/ Discussed in cased conference June 2024: Jan 13, 2023 and Sep 21, 2022 -> [place] and waingin CT but there are areas of improvement but also new areas of nodulairty.Overall slightly better.  Has also seen ID Dr Constance PASSER - he supports Dx of MTB/ Then 04/10/23 saw cardiology: reported improvement in DOE. Co Caslcium was 42nd percentile.  She presents now with her husband for follow-up.  She says her cough is completely resolved.  She is feeling great.  Her weight is good.  No shortness of breath no wheezing.  She completed direct observed therapy by the public health department on Friday, 04/28/2023.  She said they did a chest x-ray was clear.  She wants to have a CT scan of the chest.  We discussed that our case conference and I shared these findings with her.  She and her husband wanted know about travel history.  I did tell them she is free to travel.  They are going to go to [place] in [place] in May  2025 for their 60th wedding anniversary.  She also was asking about travel advice particularly masking around planes.  We went over this.  I did advise her to mask in airports and planes and in any indoor cluster.  Did advise her to be up-to-date with all her vaccines.  Did indicate that I will give her prednisone  Paxlovid  and antibiotic for travel to [place].  We went over her pneumonia vaccination history.  She is opted to take the Prevnar 20 today.  She will have flu shot and COVID shot on her own this fall 2024.     reatment started 09/21/2022 ([facility]) Has been on consolidation with RI Lft rising to 150s and tx hold 5/06   5/02 cbc 4.7/14/237; lft 114/144/94/0.4 (ast/alt/alkphos/tbili) 5/14 lft 90/123/87/0.5    OV 07/31/2023  Subjective:  Patient ID: Elaine Navarro, female , DOB: 1945/10/13 , age 58 y.o. , MRN: [id number] , ADDRESS: [address] [place] [id number] PCP Dwight Trula SQUIBB, MD Patient Care Team: Dwight Trula SQUIBB, MD as PCP - General (Internal Medicine) Okey Vina GAILS, MD as PCP - Cardiology (Cardiology)  This Provider for this visit: Treatment Team:  Attending Provider: Geronimo Amel, MD    07/31/2023 -   Chief Complaint  Patient presents with   Follow-up    Ct f/u 11/1, pt states she has questions      HPI Elaine Navarro 78 y.o. -returns for follow-up.  Presents with her husband.  History is provided mostly by her but also review of the external medical record and to some extent the husband is an independent historian.After the last visit she was doing well.  However after coming of antituberculous treatment in August 2024 she felt she had a recurrence of her GI issues.  She is complaining of cramps particular in the suprapubic area along with diarrhea and bloating and abdominal pain.  She was evaluated by Dr. Kristine Gentry at [facility].  I reviewed his notes.  Initially seen 06/19/2023.  By July 07, 2023 C. difficile antigen was  positive.  She took p.o. vancomycin.  She showed me those results.  Diarrhea got better she took p.o. vancomycin for 10 days.  But today the diarrhea is back and the cramps are beginning to come back.  She is worried the C. difficile is back.  She plans to call Dr. Danne today.  From  From a respiratory standpoint she is doing well.  She has very mild cough.  She is completed antituberculous treatment.  Weight is holding.  Cough score as below.  She had CT scan of the chest November 2024 and I compared the nodules compared to January 2024 and also April 2023.  In my personal visualization opinion these nodules are significantly improved.  There are some residual bronchiectasis.  I showed this to her.  Radiology feels it is stable but I feel it is improved.  She has travel coming up to [place]  and [place] for her jabier wedding anniversary in May 2025.  She and I agreed for her to do a pretravel visit in March 2025.        CT Chest data from date: yes  - personally visualized and independently interpreted : yes - my findings are: improved nodules arrative & Impression  CLINICAL DATA:  78 year old female with history of pulmonary nodules. History of mycobacterium tuberculosis exposure.   EXAM: CT CHEST WITHOUT CONTRAST   TECHNIQUE: Multidetector CT imaging of the chest was performed following the standard protocol without IV contrast.   RADIATION DOSE REDUCTION: This exam was performed according to the departmental dose-optimization program which includes automated exposure control, adjustment of the mA and/or kV according to patient size and/or use of iterative reconstruction technique.   COMPARISON:  Cardiac CT 03/02/2023.  Chest CT 01/13/2023.   FINDINGS: Cardiovascular: Heart size is normal. There is no significant pericardial fluid, thickening or pericardial calcification. There is aortic atherosclerosis, as well as atherosclerosis of the great vessels of the mediastinum and the  coronary arteries, including calcified atherosclerotic plaque in the left anterior descending and right coronary arteries.   Mediastinum/Nodes: No pathologically enlarged mediastinal or hilar lymph nodes. Please note that accurate exclusion of hilar adenopathy is limited on noncontrast CT scans. Esophagus is unremarkable in appearance. No axillary lymphadenopathy.   Lungs/Pleura: Pleural-parenchymal thickening and areas of architectural distortion and nodularity again noted, most evident in the lung apices, very similar to the prior study. A few other scattered areas of mild cylindrical bronchiectasis and peripheral micro nodularity are noted elsewhere in the lungs, also similar to the prior study, most evident throughout the upper lobes of the lungs, generally measuring 4 mm or less in size. No definite new suspicious appearing pulmonary nodules or masses are noted. No acute consolidative airspace disease. No pleural effusions.   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 Atherosclerotic calcifications in the abdominal aorta.   Musculoskeletal: There are no aggressive appearing lytic or blastic lesions noted in the visualized portions of the skeleton.   IMPRESSION: 1. 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 No new or acute findings are noted. 2. Aortic atherosclerosis, in addition to two-vessel coronary artery disease. Please note that although the presence of coronary artery calcium  documents the presence of coronary artery disease, the severity of this disease and any potential stenosis cannot be assessed on this non-gated CT examination. Assessment for potential risk factor modification, dietary therapy or pharmacologic  therapy may be warranted, if clinically indicated. 3. 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 This is unchanged. Follow-up nonemergent CTA of the abdomen is suggested in the near future to provide definitive characterization and establish a baseline for future follow-up examinations.   Aortic Atherosclerosis (ICD10-I70.0).     Electronically Signed   By: Toribio Aye M.D.   On: 07/28/2023 08:54      OV 11/03/2023  Subjective:  Patient ID: Elaine Navarro, female , DOB: Mar 13, 1946 , age 81 y.o. , MRN: [id number] , ADDRESS: [address] [place] [address] PCP Dwight Trula SQUIBB, MD Patient Care Team: Dwight Trula SQUIBB, MD as PCP - General (Internal Medicine) Okey Vina GAILS, MD as PCP - Cardiology (Cardiology)  This Provider for  this visit: Treatment Team:  Attending Provider: Geronimo Amel, MD  Type of visit: Video Virtual Visit Identification of patient Elaine Navarro with 1946/04/30 and MRN [id number] - 2 person identifier Risks: Risks, benefits, limitations of telephone visit explained. Patient understood and verbalized agreement to proceed Anyone else on call: just him + husband on side Patient location: her home This provider location: [address]; [place]; [place] [address]. [facility]. 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 - C Ciff resolved but having colitis an taking budesonide  and is on 90 day Rx. GOing to [place]. Having some cough. Very mild mucus only. Clear mucuh. Cough severity is 1-2 of 10 only. In May 2025: going to [place] -> will make a visit to get travel advice from pulmonary perspective.  OF note, she is going to start PROLIA  for OP - conc\ern is infection risk esp pumonary. 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   D/w Sherry Pennant our pharmacist - slight general increase infection risk but if no active infection no contraindication . Showed her case control data and also [employer].    OV 11/20/2023  Subjective:  Patient ID: Elaine Navarro, female , DOB: [birth year] , age 45 y.o. , MRN: [id number] , ADDRESS: [address] [place] [address] PCP Dwight Trula SQUIBB, MD Patient Care Team: Dwight Trula SQUIBB, MD as PCP - General (Internal Medicine) Okey Vina GAILS, MD as PCP - Cardiology (Cardiology)  This Provider for this visit: Treatment Team:  Attending Provider: Geronimo Amel, MD    11/20/2023 -   Chief Complaint  Patient presents with   Follow-up    Leaving for [place] end of April 2025- requesting meds to take with her in case she gets sick. She has occ cough with white sputum.      HPI Elaine Navarro 78 y.o. -returns for follow-up.  Presents with her husband.  At this point in time she tells me that since Christmas 2024 she is having a mild dry cough occasional white sputum but otherwise doing well.  The cough itself is stable.  She is not having any fever or chills.  She is finished her tuberculosis treatment.  She is willing to get a chest x-ray today.  Of note she is going to go to [place] for the [place] trip.  The trip is going to be end of April 2025.  She wants medications.  We discussed Paxlovid  in case she gets COVID.  We discussed Z-Pak and prednisone  in case as respiratory exacerbations of bronchitis.  We discussed cephalexin  for possible cellulitis.  A 1 year scan is due as part of her smoking history and tuberculosis follow-up in November 2025.  Although she rated there is a very mild cough on the RSI cough score the cough score is 16.   Patient ID: Elaine Navarro, female , DOB: 04-26-1946 , age 60 y.o. , MRN: [id number] , ADDRESS: [address] [place] [address] PCP Dwight Trula SQUIBB, MD Patient Care Team: Dwight Trula SQUIBB, MD as PCP - General (Internal Medicine) Okey Vina GAILS, MD as PCP - Cardiology (Cardiology)  This Provider for this visit: Treatment Team:  Attending Provider: Geronimo Amel, MD    02/01/2024 -   Chief Complaint  Patient presents with   Follow-up  Increased cough over the past wk- slightly prod with clear to light yellow sputum. She has minimal SOB.      HPI Elaine Navarro 78 y.o.  - ACUTE Visit.  Presents with her husband.  She just returned from [place] last week on Wednesday, Jan 24, 2024.  She says several people in her trip to [place] felt sick with a cough.  She also got a cough upon return.  This yellow sputum but she feels she is getting better.  She is taking Mucinex.  It is associated with significant fatigue.  However in [place] she was walking 3 to 4 miles per day and 110 Fahrenheit heat.  There is no fever or chills or rigors.  No mosquito bites.  She does have Z-Pak that she took with her for travel but she is not taking this there is no wheezing.  Outside of this during the trip she had syncope.  She had her first episode of syncope/30/25 while on the plane to be.  She room was feeling dizzy and slowly passing out.  No injuries no bladder or bowel incontinence.  No seizures.  She had a second 1 a few days later in a hotel in [place].  Both she recovered spontaneously none since then.  There are some dry mouth she had at that time.  No chest pains.  Apparently PCP is reassured her.  I asked the patient to discuss with PCP about potential role for ZIO monitor  Her neck scheduled visit is in November 2025 with CT chest.     Cultures: 1/02 afb sputum -- few mtb (S inh mic 0.1, rifampin mic 1, ethambutol mic 5; pza not done as not available currently) 2/14 sputum cx -- few mtb 4/03 sputum cx -- ngtd 4/04 sputum cx -- ngtd 4/17 sputum cx -- ngtd 4/18 sputum cx -- ngtd      OV 07/12/2024  Subjective:  Patient ID: Elaine Navarro, female , DOB: April 28, 1946 , age  35 y.o. , MRN: [id number] , ADDRESS: [address] [place] [facility] [address] PCP Dwight Trula SQUIBB, MD Patient Care Team: Dwight Trula SQUIBB, MD as PCP - General (Internal Medicine) Okey Vina GAILS, MD as PCP - Cardiology (Cardiology)  This Provider for this visit: Treatment Team:  Attending Provider: Geronimo Amel, MD    07/12/2024 -  FU MTB lung nodules and cough    HPI Elaine Navarro 78 y.o. -TAKEYLA MILLION is a 78 year old female er chest symptoms have improved significantly, with only a little cough and slight tightness remaining. This time of year is typically difficult for her respiratory symptoms, but they are much better now. She had a chest x-ray and CT scan previously, which showed chronic changes but no signs of tuberculosis.  I personally visualized the CT chest and compared to the one from last year.  There are chronic stable lung nodules.  She did admit to a long history of smoking.  She prefers to have another CT scan of the chest.  The American Cancer Society recommends screening up to age 55.  Nevertheless she needs follow-up for the lung nodules.  Other issues -  No new hospitalizations or significant health issues since her last visit in May. She has received her flu and COVID vaccinations. She has a history of smoking but quit over fifteen years ago. She plans to travel to [place] and [place] in February with her family.  - th a history of migraines who presents with  worsening headaches and memory issues. She has been referred to a mind care program at [facility] for evaluation of potential Alzheimer's disease. She has been experiencing worsening migraines over the last couple of days, with tenderness in her head and stiffness in her hand. These symptoms worsen when she lies down, affecting her sleep. She is currently taking sumatriptan, which provides some relief but is not fully effective. She is experiencing memory issues and has been referred to a mind care  program at [facility]. She underwent cognitive testing and blood work. She is participating in a clinical study.  She is worried about having a stroke.  Neurologically she is intact today.    Dr Felice Reflux Symptom Index (> 13-15 suggestive of LPR cough) Pre RX  - ATb Rx Post start ATbx Rx - 2 weeks 07/31/2023 Completed ATT by Dept of Public health 11/20/2023 Mild cough back since Christmas 2024  Hoarseness of problem with voice 5 2 0 3  Clearing  Of Throat 5 3 0 3  Excess throat mucus or feeling of post nasal drip 5 3 1 3   Difficulty swallowing food, liquid or tablets 3 2 0 0  Cough after eating or lying down 5 3 0 0  Breathing difficulties or choking episodes 4 2 0 0  Troublesome or annoying cough 5 3 0 3  Sensation of something sticking in throat or lump in throat 5 2 0 3  Heartburn, chest pain, indigestion, or stomach acid coming up 3 2 0 1  TOTAL 40 21 0 16    CT Chest data from date: oct 2025*  - personally visualized and independently interpreted : yes - my findings are: agree Narrative & Impression  CLINICAL DATA:  Tuberculosis suspected, chronic cough, follow-up pulmonary nodule, former smoker * Tracking Code: BO *   EXAM: CT CHEST WITHOUT CONTRAST   TECHNIQUE: Multidetector CT imaging of the chest was performed following the standard protocol without IV contrast.   RADIATION DOSE REDUCTION: This exam was performed according to the departmental dose-optimization program which includes automated exposure control, adjustment of the mA and/or kV according to patient size and/or use of iterative reconstruction technique.   COMPARISON:  07/24/2023   FINDINGS: Cardiovascular: Aortic atherosclerosis. Normal heart size. Left and right coronary artery calcifications. No pericardial effusion.   Mediastinum/Nodes: No enlarged mediastinal, hilar, or axillary lymph nodes. Heterogeneous, enlarged left lobe of the thyroid , previously evaluated by dedicated thyroid   ultrasound. Trachea, and esophagus demonstrate no significant findings.   Lungs/Pleura: Unchanged irregular nodularity in the peripheral left upper lobe (series 5, image 46). Unchanged clustered centrilobular nodularity in the peripheral right upper lobe (series 5, image 23). Unchanged biapical pleural-parenchymal scarring, greater in the right apex (series 5, image 4). No pleural effusion or pneumothorax.   Upper Abdomen: No acute abnormality. Unchanged, densely rim calcified aneurysm of the right renal artery or a branch near the right renal hilum measuring 1.2 x 1.2 cm (series 2, image 153).   Musculoskeletal: No chest wall abnormality. No acute osseous findings.   IMPRESSION: 1. Unchanged irregular nodularity in the peripheral left upper lobe. Unchanged clustered centrilobular nodularity in the peripheral right upper lobe. Findings are most consistent with sequelae of atypical infection including atypical mycobacterium, without specific evidence of ongoing infection. 2. Unchanged biapical pleural-parenchymal scarring, greater in the right apex. 3. Coronary artery disease. 4. Unchanged, densely rim calcified aneurysm of the right renal artery or a branch near the right renal hilum measuring 1.2 x 1.2 cm.  Aortic Atherosclerosis (ICD10-I70.0).     Electronically Signed   By: Marolyn JONETTA Jaksch M.D.   On: 06/21/2024 16:01      PFT     Latest Ref Rng & Units 12/28/2021   10:42 AM  PFT Results  FVC-Pre L 3.18   FVC-Predicted Pre % 121   FVC-Post L 3.07   FVC-Predicted Post % 117   Pre FEV1/FVC % % 72   Post FEV1/FCV % % 80   FEV1-Pre L 2.29   FEV1-Predicted Pre % 117   FEV1-Post L 2.44   DLCO uncorrected ml/min/mmHg 15.17   DLCO UNC% % 82   DLCO corrected ml/min/mmHg 15.17   DLCO COR %Predicted % 82   DLVA Predicted % 72   TLC L 4.40   TLC % Predicted % 89   RV % Predicted % 64        LAB RESULTS last 96 hours No results found.       has a past  medical history of Aneurysm of renal artery, Fracture of right wrist (2011), GERD (gastroesophageal reflux disease), Hypertension, Left bundle branch block, Left thyroid  nodule (2003), Lymphocytic colitis (03/2013), Migraines, Osteoporosis, PONV (postoperative nausea and vomiting), Sciatica, and Visceral injury.   reports that she quit smoking about 41 years ago. Her smoking use included cigarettes. She started smoking about 51 years ago. She has a 30 pack-year smoking history. She has never used smokeless tobacco.  Past Surgical History:  Procedure Laterality Date   ABDOMINOPLASTY  2010   APPENDECTOMY  1962   BREAST EXCISIONAL BIOPSY Bilateral 2001   x 3   BRONCHIAL WASHINGS  09/16/2022   Procedure: BRONCHIAL WASHINGS;  Surgeon: Geronimo Amel, MD;  Location: [facility] ENDOSCOPY;  Service: Endoscopy;;   CATARACT EXTRACTION, BILATERAL Bilateral 01/2020   Dental implants  1993   HEMORRHOID SURGERY  2000   JOINT REPLACEMENT     KNEE ARTHROSCOPY W/ DEBRIDEMENT Right 07/2020   Dr. Yvone   TOTAL KNEE ARTHROPLASTY Right 01/21/2022   Procedure: RIGHT TOTAL KNEE ARTHROPLASTY;  Surgeon: Vernetta Lonni GRADE, MD;  Location: [facility] ORS;  Service: Orthopedics;  Laterality: Right;  RFNA   VAGINAL HYSTERECTOMY  1974   Dysfunctional uterine bleeding.   VIDEO BRONCHOSCOPY N/A 09/16/2022   Procedure: VIDEO BRONCHOSCOPY WITHOUT FLUORO;  Surgeon: Geronimo Amel, MD;  Location[facility] ENDOSCOPY;  Service: Endoscopy;  Laterality: N/A;    Allergies  Allergen Reactions   Caine-1 [Lidocaine] Swelling    Allergic to Septocaine Articaine HCL 4% w/epi   Indomethacin Swelling   Other Diarrhea    Dairy Products    Articaine-Epinephrine  Other (See Comments)   Gabapentin      Twitches   Hydrocodone Nausea Only   Latex Swelling    Possible allergy    Lamisil [Terbinafine] Rash    Immunization History  Administered Date(s) Administered   Fluad Quad(high Dose 65+) 05/29/2022, 05/14/2023   Fluzone Influenza virus  vaccine,trivalent (IIV3), split virus 06/09/2014, 06/08/2015, 06/08/2017, 06/19/2018, 05/27/2019, 05/18/2020, 05/31/2021   Hepatitis A 03/10/2003, 10/15/2003   Hepatitis B 04/15/2003, 05/21/2003, 10/15/2003   INFLUENZA, HIGH DOSE SEASONAL PF 06/19/2018   Influenza,inj,Quad PF,6+ Mos 06/13/2011   Influenza-Unspecified 05/16/2013   PFIZER(Purple Top)SARS-COV-2 Vaccination 10/01/2019, 10/21/2019, 05/07/2020, 12/15/2020, 05/31/2021, 06/01/2022   PNEUMOCOCCAL CONJUGATE-20 05/02/2023   Pfizer(Comirnaty)Fall Seasonal Vaccine 12 years and older 12/09/2022, 05/26/2023, 05/21/2024   Pneumococcal Conjugate-13 10/18/2013   Pneumococcal Polysaccharide-23 06/22/2010   Respiratory Syncytial Virus Vaccine,Recomb Aduvanted(Arexvy) 05/11/2022   Td 03/10/2003   Tdap 11/08/2010, 12/29/2019, 01/11/2020   Typhoid Inactivated 03/10/2003,  07/05/2013   Yellow Fever 07/05/2013   Zoster, Live 01/19/2006, 12/21/2016, 06/26/2017    Family History  Problem Relation Age of Onset   Hypertension Mother    Cancer Sister        melanoma   Autoimmune disease Sister        crest and sjor   Lupus Brother      Current Outpatient Medications:    albuterol  (VENTOLIN  HFA) 108 (90 Base) MCG/ACT inhaler, Inhale 2 puffs into the lungs every 6 (six) hours as needed for wheezing or shortness of breath., Disp: 8 g, Rfl: 2   amLODipine  (NORVASC ) 10 MG tablet, Take 10 mg by mouth daily., Disp: , Rfl:    atorvastatin  (LIPITOR) 10 MG tablet, TAKE 1/2 TABLET(5 MG) BY MOUTH DAILY, Disp: 45 tablet, Rfl: 3   botulinum toxin Type A (BOTOX) 100 units SOLR injection, Inject into the skin., Disp: , Rfl:    budesonide  (ENTOCORT EC ) 3 MG 24 hr capsule, Take 3 mg by mouth daily as needed (colitis flare)., Disp: , Rfl:    buPROPion  (WELLBUTRIN  XL) 150 MG 24 hr tablet, Take 1 tablet (150 mg total) by mouth daily., Disp: 90 tablet, Rfl: 3   chlorhexidine  (PERIDEX ) 0.12 % solution, SMARTSIG:By Mouth, Disp: , Rfl:    clobetasol cream (TEMOVATE)  0.05 %, APPLY TOPICALLY TO THE AFFECTED AREA TWICE DAILY. STOP WHEN SMOOTH. AVOID FACE AND BODY FOLDS, Disp: , Rfl:    finasteride  (PROSCAR ) 5 MG tablet, Take 5 mg by mouth daily., Disp: , Rfl:    Melatonin 5 MG TABS, Take 5 mg by mouth at bedtime as needed (sleep)., Disp: , Rfl:    Multiple Vitamin (MULTIVITAMIN) tablet, Take 1 tablet by mouth daily., Disp: , Rfl:    Potassium 99 MG TABS, Take 99 mg by mouth daily., Disp: , Rfl:    Povidone, PF, (IVIZIA DRY EYES) 0.5 % SOLN, Place 1 drop into both eyes at bedtime., Disp: , Rfl:    SUMAtriptan (IMITREX) 50 MG tablet, Take 50 mg by mouth every 2 (two) hours as needed for migraine., Disp: , Rfl:    triamcinolone ointment (KENALOG) 0.1 %, Apply to left nare daily as needed., Disp: , Rfl:    TYMLOS 3120 MCG/1.56ML SOPN, As directed once daily, Disp: , Rfl:       Objective:   Vitals:   07/12/24 0827  BP: 134/76  Pulse: 79  Temp: 98 F (36.7 C)  TempSrc: Oral  SpO2: 100%  Weight: 110 lb 12.8 oz (50.3 kg)  Height: 5' 3 (1.6 m)    Estimated body mass index is 19.63 kg/m as calculated from the following:   Height as of this encounter: 5' 3 (1.6 m).   Weight as of this encounter: 110 lb 12.8 oz (50.3 kg).  @WEIGHTCHANGE @  [employer]   07/12/24 0827  Weight: 110 lb 12.8 oz (50.3 kg)     Physical Exam   General: No distress. Looks well O2 at rest: no Cane present: no Sitting in wheel chair: no Frail: no Obese: no Neuro: Alert and Oriented x 3. GCS 15. Speech normal Psych: Pleasant Resp:  Barrel Chest - no.  Wheeze - no, Crackles - no, No overt respiratory distress CVS: Normal heart sounds. Murmurs - no Ext: Stigmata of Connective Tissue Disease - no HEENT: Normal upper airway. PEERL +. No post nasal drip        Assessment/     Assessment & Plan MTB (Mycobacterium tuberculosis infection)  Stopped smoking with  greater than 25 pack year history  Multiple lung nodules on CT  Travel advice  encounter    PLAN Patient Instructions   MTB (Mycobacterium tuberculosis infection) s/p Rx Lung nodules Chronic cough Former heavy cigarette smoker (20-39 per day)  - CT scan Oct 2025 shows sequelate of MTB rx with stable nodules x 1 year.  Plan - repeat CT chest November 2026 - use albuterol  as needed   Travel advice encounter to  [place], [place] in Feb 2026   Plan - recommend [employer] and [employer]  - recommend [place] [employer] - recommend [employer]   Follow-up - Return in May 2026; 15 min visit with Dr Geronimo BOSCH    Return for - Return in May 2026; 15 min visit with Dr Geronimo.    SIGNATURE    Dr. Dorethia Geronimo, M.D., F.C.C.P,  Pulmonary and Critical Care Medicine Staff Physician, [facility] Center Director - Interstitial Lung Disease  Program  Pulmonary Fibrosis [facility] Network at [facility] [place], [place], [address]  Pager: [phone], If no answer or between  15:00h - 7:00h: call [phone] Telephone: [phone]  12:28 PM 07/12/2024

## 2024-07-12 NOTE — Patient Instructions (Addendum)
  MTB (Mycobacterium tuberculosis infection) s/p Rx Lung nodules Chronic cough Former heavy cigarette smoker (20-39 per day)  - CT scan Oct 2025 shows sequelate of MTB rx with stable nodules x 1 year.  Plan - repeat CT chest November 2026 - use albuterol  as needed   Travel advice encounter to  [place], [place] in Feb 2026   Plan - recommend [employer] [facility] [employer]  - recommend [employer] - recommend [employer]   Follow-up - Return in May 2026; 15 min visit with Dr Geronimo

## 2024-07-14 ENCOUNTER — Emergency Department (HOSPITAL_BASED_OUTPATIENT_CLINIC_OR_DEPARTMENT_OTHER)

## 2024-07-14 ENCOUNTER — Emergency Department (HOSPITAL_BASED_OUTPATIENT_CLINIC_OR_DEPARTMENT_OTHER)
Admission: EM | Admit: 2024-07-14 | Discharge: 2024-07-14 | Disposition: A | Discharge status: Home / Self Care | Attending: Emergency Medicine | Admitting: Emergency Medicine

## 2024-07-14 ENCOUNTER — Encounter (HOSPITAL_BASED_OUTPATIENT_CLINIC_OR_DEPARTMENT_OTHER): Payer: Self-pay

## 2024-07-14 DIAGNOSIS — G43809 Other migraine, not intractable, without status migrainosus: Secondary | ICD-10-CM

## 2024-07-14 DIAGNOSIS — Z79899 Other long term (current) drug therapy: Secondary | ICD-10-CM | POA: Insufficient documentation

## 2024-07-14 DIAGNOSIS — Z91040 Latex allergy status: Secondary | ICD-10-CM | POA: Insufficient documentation

## 2024-07-14 DIAGNOSIS — R519 Headache, unspecified: Secondary | ICD-10-CM | POA: Insufficient documentation

## 2024-07-14 MED ORDER — LACTATED RINGERS IV BOLUS
1000.0000 mL | Freq: Once | INTRAVENOUS | Status: AC
  Administered 2024-07-14: 1000 mL via INTRAVENOUS

## 2024-07-14 MED ORDER — METOCLOPRAMIDE HCL 5 MG/ML IJ SOLN
5.0000 mg | Freq: Once | INTRAMUSCULAR | Status: AC
  Administered 2024-07-14: 5 mg via INTRAVENOUS
  Filled 2024-07-14: qty 2

## 2024-07-14 MED ORDER — DEXAMETHASONE SOD PHOSPHATE PF 10 MG/ML IJ SOLN
10.0000 mg | Freq: Once | INTRAMUSCULAR | Status: AC
  Administered 2024-07-14: 10 mg via INTRAVENOUS

## 2024-07-14 MED ORDER — LACTATED RINGERS IV SOLN: INTRAVENOUS | Status: DC

## 2024-07-14 MED ORDER — DIPHENHYDRAMINE HCL 50 MG/ML IJ SOLN
12.5000 mg | Freq: Once | INTRAMUSCULAR | Status: AC
  Administered 2024-07-14: 12.5 mg via INTRAVENOUS
  Filled 2024-07-14: qty 1

## 2024-07-14 NOTE — Telephone Encounter (Signed)
 Pt presented to [facility] in person accompanied by her husband. Due to symptoms being reported the front desk contacted clinical staff for quick look, while she did not have any acute stroke findings on a FAST exam it was clear that she would be beyond the reasonable workup that could be performed in the urgent care setting. I identified no immediate life threats. Her husband advised that he felt comfortable transporting directly to a local ED via POV.

## 2024-07-14 NOTE — Telephone Encounter (Signed)
 Spoke to the patient She rates her headache at 8-9 with with taking Sumatriptan. She had to take 4 yesterday as well. She says she just doesn't feel right. She is agreeable to go to Urgent Care but I also recommended the ED as well. Patient will follow up after her visit to Urgent Care.

## 2024-07-14 NOTE — ED Triage Notes (Signed)
 Patient arrives with right sided facial pain starting Thursday. She also gets migraines which she gets Botox for. Her care for this is done at [facility] neurology. She also says she has tenderness to touch, and it radiates to her head. She says the pain feels like an electric shock. She is alert and oriented x4, has no other neuro deficits other than the facial pain and numbness. 8/10 for pain.

## 2024-07-14 NOTE — ED Provider Notes (Signed)
 [facility]MENT AT [facility] Provider Note   CSN: [id number] Arrival date & time: 07/14/24  1241     Patient presents with: Facial Pain   Elaine Navarro is a 78 y.o. female.   78 year old female with history of migraines presents with 4 days of right-sided facial pain.  Patient states that this is the location where she used to get her migraines.  States that it starts at the right side of her face and goes into her scalp.  States that symptoms are better with warm heat.  She has somewhat light sensitive.  Denies any vision loss.  Denies any fever or chills.  States that at times she has electric shock.  Denies any fever or neck pain.  No vomiting.  No focal weakness.  No gait ataxia.  No confusion.  Husband at bedside and confirms that patient is at her baseline       Prior to Admission medications   Medication Sig Start Date End Date Taking? Authorizing Provider  albuterol  (VENTOLIN  HFA) 108 (90 Base) MCG/ACT inhaler Inhale 2 puffs into the lungs every 6 (six) hours as needed for wheezing or shortness of breath. 02/01/24   Geronimo Amel, MD  amLODipine  (NORVASC ) 10 MG tablet Take 10 mg by mouth daily.    [provider]  atorvastatin  (LIPITOR) 10 MG tablet TAKE 1/2 TABLET(5 MG) BY MOUTH DAILY 05/24/24   Ross, Paula V, MD  botulinum toxin Type A (BOTOX) 100 units SOLR injection Inject into the skin. 06/13/22   [provider]  budesonide  (ENTOCORT EC ) 3 MG 24 hr capsule Take 3 mg by mouth daily as needed (colitis flare).    [provider]  buPROPion  (WELLBUTRIN  XL) 150 MG 24 hr tablet Take 1 tablet (150 mg total) by mouth daily. 07/03/24   Cleotilde Ronal RAMAN, MD  chlorhexidine  (PERIDEX ) 0.12 % solution SMARTSIG:By Mouth 01/30/23   [provider]  clobetasol cream (TEMOVATE) 0.05 % APPLY TOPICALLY TO THE AFFECTED AREA TWICE DAILY. STOP WHEN SMOOTH. AVOID FACE AND BODY FOLDS 01/22/23   [provider]  finasteride   (PROSCAR ) 5 MG tablet Take 5 mg by mouth daily.    [provider]  Melatonin 5 MG TABS Take 5 mg by mouth at bedtime as needed (sleep).    [provider]  Multiple Vitamin (MULTIVITAMIN) tablet Take 1 tablet by mouth daily.    [provider]  Potassium 99 MG TABS Take 99 mg by mouth daily.    [provider]  Povidone, PF, (IVIZIA DRY EYES) 0.5 % SOLN Place 1 drop into both eyes at bedtime.    [provider]  SUMAtriptan (IMITREX) 50 MG tablet Take 50 mg by mouth every 2 (two) hours as needed for migraine.    [provider]  triamcinolone ointment (KENALOG) 0.1 % Apply to left nare daily as needed. 11/18/22   [provider]  TYMLOS 3120 MCG/1.56ML SOPN As directed once daily 06/22/23   [provider]    Allergies: Caine-1 [lidocaine], Indomethacin, Other, Articaine-epinephrine , Gabapentin , Hydrocodone, Latex, and Lamisil [terbinafine]    Review of Systems  All other systems reviewed and are negative.   Updated Vital Signs BP (!) 171/70   Pulse 83   Temp 98 F (36.7 C) (Oral)   Resp 18   LMP 12/14/1972   SpO2 100%   Physical Exam Vitals and nursing note reviewed.  Constitutional:      General: She is not in acute distress.  Appearance: Normal appearance. She is well-developed. She is not toxic-appearing.  HENT:     Head: Normocephalic and atraumatic.  Eyes:     General: Lids are normal.     Conjunctiva/sclera: Conjunctivae normal.     Pupils: Pupils are equal, round, and reactive to light.  Neck:     Thyroid : No thyroid  mass.     Trachea: No tracheal deviation.  Cardiovascular:     Rate and Rhythm: Normal rate and regular rhythm.     Heart sounds: Normal heart sounds. No murmur heard.    No gallop.  Pulmonary:     Effort: Pulmonary effort is normal. No respiratory distress.     Breath sounds: Normal breath sounds. No stridor. No decreased breath sounds, wheezing, rhonchi or rales.  Abdominal:      General: There is no distension.     Palpations: Abdomen is soft.     Tenderness: There is no abdominal tenderness. There is no rebound.  Musculoskeletal:        General: No tenderness. Normal range of motion.     Cervical back: Normal range of motion and neck supple.  Skin:    General: Skin is warm and dry.     Findings: No abrasion or rash.  Neurological:     General: No focal deficit present.     Mental Status: She is alert and oriented to person, place, and time. Mental status is at baseline.     GCS: GCS eye subscore is 4. GCS verbal subscore is 5. GCS motor subscore is 6.     Cranial Nerves: No cranial nerve deficit.     Sensory: No sensory deficit.     Motor: Motor function is intact.  Psychiatric:        Attention and Perception: Attention normal.        Speech: Speech normal.        Behavior: Behavior normal.     (all labs ordered are listed, but only abnormal results are displayed) Labs Reviewed - No data to display  EKG: None  Radiology: No results found.   Procedures   Medications Ordered in the ED  lactated ringers  bolus 1,000 mL (has no administration in time range)  lactated ringers  infusion (has no administration in time range)  dexamethasone  (DECADRON ) injection 10 mg (has no administration in time range)  metoCLOPramide  (REGLAN ) injection 5 mg (has no administration in time range)  diphenhydrAMINE  (BENADRYL ) injection 12.5 mg (has no administration in time range)                                    Medical Decision Making Amount and/or Complexity of Data Reviewed Radiology: ordered.  Risk Prescription drug management.   Patient treated with IV fluids, Reglan , Decadron  and feels better.  Head CT without acute findings.  Suspect the patient is having a migraine.  Low suspicion for subarachnoid meningitis.  Will discharge home     Final diagnoses:  None    ED Discharge Orders     None          Dasie Faden, MD 07/14/24  1510

## 2024-07-14 NOTE — ED Notes (Signed)
 DC paperwork given and verbally understood.

## 2024-07-15 ENCOUNTER — Encounter: Payer: Self-pay | Admitting: Radiology

## 2024-07-15 DIAGNOSIS — G43701 Chronic migraine without aura, not intractable, with status migrainosus: Secondary | ICD-10-CM | POA: Diagnosis not present

## 2024-07-15 DIAGNOSIS — G4489 Other headache syndrome: Secondary | ICD-10-CM | POA: Diagnosis not present

## 2024-07-15 DIAGNOSIS — G43719 Chronic migraine without aura, intractable, without status migrainosus: Secondary | ICD-10-CM | POA: Diagnosis not present

## 2024-07-24 DIAGNOSIS — R413 Other amnesia: Secondary | ICD-10-CM | POA: Diagnosis not present

## 2024-07-26 DIAGNOSIS — G4489 Other headache syndrome: Secondary | ICD-10-CM | POA: Diagnosis not present

## 2024-07-27 DIAGNOSIS — R5383 Other fatigue: Secondary | ICD-10-CM | POA: Diagnosis not present

## 2024-07-27 DIAGNOSIS — R0989 Other specified symptoms and signs involving the circulatory and respiratory systems: Secondary | ICD-10-CM | POA: Diagnosis not present

## 2024-07-27 DIAGNOSIS — G4489 Other headache syndrome: Secondary | ICD-10-CM | POA: Diagnosis not present

## 2024-07-30 DIAGNOSIS — R0989 Other specified symptoms and signs involving the circulatory and respiratory systems: Secondary | ICD-10-CM | POA: Diagnosis not present

## 2024-07-30 DIAGNOSIS — G43909 Migraine, unspecified, not intractable, without status migrainosus: Secondary | ICD-10-CM | POA: Diagnosis not present

## 2024-08-07 DIAGNOSIS — I2510 Atherosclerotic heart disease of native coronary artery without angina pectoris: Secondary | ICD-10-CM | POA: Diagnosis not present

## 2024-08-07 DIAGNOSIS — I10 Essential (primary) hypertension: Secondary | ICD-10-CM | POA: Diagnosis not present

## 2024-08-07 DIAGNOSIS — I700 Atherosclerosis of aorta: Secondary | ICD-10-CM | POA: Diagnosis not present

## 2024-08-11 DIAGNOSIS — F32A Depression, unspecified: Secondary | ICD-10-CM | POA: Diagnosis not present

## 2024-08-11 DIAGNOSIS — M8000XA Age-related osteoporosis with current pathological fracture, unspecified site, initial encounter for fracture: Secondary | ICD-10-CM | POA: Diagnosis not present

## 2024-08-11 DIAGNOSIS — I700 Atherosclerosis of aorta: Secondary | ICD-10-CM | POA: Diagnosis not present

## 2024-08-11 DIAGNOSIS — I2510 Atherosclerotic heart disease of native coronary artery without angina pectoris: Secondary | ICD-10-CM | POA: Diagnosis not present

## 2024-08-11 DIAGNOSIS — E7800 Pure hypercholesterolemia, unspecified: Secondary | ICD-10-CM | POA: Diagnosis not present

## 2024-08-11 DIAGNOSIS — I10 Essential (primary) hypertension: Secondary | ICD-10-CM | POA: Diagnosis not present

## 2024-08-13 DIAGNOSIS — M810 Age-related osteoporosis without current pathological fracture: Secondary | ICD-10-CM | POA: Diagnosis not present

## 2024-08-13 DIAGNOSIS — Z8719 Personal history of other diseases of the digestive system: Secondary | ICD-10-CM | POA: Diagnosis not present

## 2024-08-13 DIAGNOSIS — K52832 Lymphocytic colitis: Secondary | ICD-10-CM | POA: Diagnosis not present

## 2024-08-13 DIAGNOSIS — R1013 Epigastric pain: Secondary | ICD-10-CM | POA: Diagnosis not present

## 2024-09-10 NOTE — Telephone Encounter (Signed)
 Done

## 2024-09-28 ENCOUNTER — Other Ambulatory Visit: Payer: Self-pay | Admitting: Nurse Practitioner

## 2024-09-28 DIAGNOSIS — I2089 Other forms of angina pectoris: Secondary | ICD-10-CM

## 2024-09-28 DIAGNOSIS — R0602 Shortness of breath: Secondary | ICD-10-CM

## 2024-09-28 DIAGNOSIS — I10 Essential (primary) hypertension: Secondary | ICD-10-CM

## 2024-09-28 DIAGNOSIS — I447 Left bundle-branch block, unspecified: Secondary | ICD-10-CM

## 2024-10-03 ENCOUNTER — Other Ambulatory Visit: Payer: Self-pay | Admitting: Nurse Practitioner

## 2024-10-03 DIAGNOSIS — I447 Left bundle-branch block, unspecified: Secondary | ICD-10-CM

## 2024-10-03 DIAGNOSIS — I10 Essential (primary) hypertension: Secondary | ICD-10-CM

## 2024-10-03 DIAGNOSIS — I2089 Other forms of angina pectoris: Secondary | ICD-10-CM

## 2024-10-03 DIAGNOSIS — R0602 Shortness of breath: Secondary | ICD-10-CM

## 2024-10-04 ENCOUNTER — Telehealth: Payer: Self-pay | Admitting: Internal Medicine

## 2024-10-04 NOTE — Telephone Encounter (Signed)
 Pt c/o medication issue:  1. Name of Medication:   valsartan  (DIOVAN ) 320 MG tablet   2. How are you currently taking this medication (dosage and times per day)?   Patient noted she is still taking this medication  3. Are you having a reaction (difficulty breathing--STAT)?   4. What is your medication issue?   Patient wants a call back to discuss her stopping this medication.

## 2024-10-04 NOTE — Telephone Encounter (Signed)
 Left message for patient to call back. Will also send to Dr Okey and Rosaline Bane, NP for clarification if this medication needing to be refilled. Multiple Rx with discontinuation dates and needing further assistance.

## 2024-10-04 NOTE — Telephone Encounter (Signed)
 Pt c/o medication issue:  1. Name of Medication: valsartan  (DIOVAN ) 320 MG tablet   2. How are you currently taking this medication (dosage and times per day)?    3. Are you having a reaction (difficulty breathing--STAT)? no  4. What is your medication issue? Office is calling to get a refill but medication is not currently listed. Please advise   Office ask that we called the patient back. Please advise

## 2024-10-09 NOTE — Telephone Encounter (Signed)
 It looks like Valsartan  was stopped last Feb / March   I do not see any documentation for this as to why, no reference Is she still taking?    If so I would continue  / refill Follow BP    Confirm that pt has follow up appt  Bring meds to appt

## 2024-10-09 NOTE — Telephone Encounter (Signed)
 Patient states she has been taking valsartan  320 mg daily. She states she contacted her PCP for a refill and they have sent in a refill today for her.  Scheduled appt with Dr. Okey for 02/10/25.  Patient also adds her husband, Elaine Navarro (05-Apr-1946) passed away on 09-17-2024. He was a patient of Dr. Dorine. It appears chart has already been marked noting patient is deceased. Will forward to Dr. Shlomo to let her know of his passing, patient expressed appreciation.  No further needs voiced at this time.

## 2025-02-10 ENCOUNTER — Ambulatory Visit: Admitting: Internal Medicine
